# Patient Record
Sex: Male | Born: 1950
Health system: Southern US, Community
[De-identification: ages and names within clinical notes are randomized; demographics above are authoritative.]

## PROBLEM LIST (undated history)

## (undated) DIAGNOSIS — C61 Malignant neoplasm of prostate: Secondary | ICD-10-CM

## (undated) DIAGNOSIS — I471 Supraventricular tachycardia, unspecified: Secondary | ICD-10-CM

## (undated) DIAGNOSIS — E785 Hyperlipidemia, unspecified: Secondary | ICD-10-CM

## (undated) HISTORY — DX: Hyperlipidemia, unspecified: E78.5

## (undated) HISTORY — DX: Malignant neoplasm of prostate: C61

## (undated) HISTORY — DX: Supraventricular tachycardia, unspecified: I47.10

## (undated) HISTORY — DX: Supraventricular tachycardia: I47.1

## (undated) HISTORY — PX: TONSILLECTOMY: SUR1361

## (undated) MED FILL — Docetaxel Soln for IV Infusion 160 MG/16ML: INTRAVENOUS | Qty: 12 | Status: AC

## (undated) MED FILL — Cabazitaxel Inj 60 MG/1.5ML (For IV Infusion): INTRAVENOUS | Qty: 4.1 | Status: AC

---

## 2014-07-14 HISTORY — PX: PROSTATECTOMY: SHX69

## 2015-05-06 DIAGNOSIS — M25532 Pain in left wrist: Secondary | ICD-10-CM | POA: Diagnosis not present

## 2015-05-06 DIAGNOSIS — S52612A Displaced fracture of left ulna styloid process, initial encounter for closed fracture: Secondary | ICD-10-CM | POA: Diagnosis not present

## 2015-05-06 DIAGNOSIS — S52572A Other intraarticular fracture of lower end of left radius, initial encounter for closed fracture: Secondary | ICD-10-CM | POA: Diagnosis not present

## 2015-05-06 DIAGNOSIS — S52502A Unspecified fracture of the lower end of left radius, initial encounter for closed fracture: Secondary | ICD-10-CM | POA: Diagnosis not present

## 2015-05-06 DIAGNOSIS — Z8546 Personal history of malignant neoplasm of prostate: Secondary | ICD-10-CM | POA: Diagnosis not present

## 2015-05-06 DIAGNOSIS — Z79899 Other long term (current) drug therapy: Secondary | ICD-10-CM | POA: Diagnosis not present

## 2015-05-06 DIAGNOSIS — W19XXXA Unspecified fall, initial encounter: Secondary | ICD-10-CM | POA: Diagnosis not present

## 2015-05-06 DIAGNOSIS — E7800 Pure hypercholesterolemia, unspecified: Secondary | ICD-10-CM | POA: Diagnosis not present

## 2015-05-12 DIAGNOSIS — E785 Hyperlipidemia, unspecified: Secondary | ICD-10-CM | POA: Diagnosis not present

## 2015-05-12 DIAGNOSIS — S62109A Fracture of unspecified carpal bone, unspecified wrist, initial encounter for closed fracture: Secondary | ICD-10-CM | POA: Diagnosis not present

## 2015-05-12 DIAGNOSIS — S62102A Fracture of unspecified carpal bone, left wrist, initial encounter for closed fracture: Secondary | ICD-10-CM | POA: Diagnosis not present

## 2015-05-12 DIAGNOSIS — W19XXXA Unspecified fall, initial encounter: Secondary | ICD-10-CM | POA: Diagnosis not present

## 2015-05-12 DIAGNOSIS — S52502A Unspecified fracture of the lower end of left radius, initial encounter for closed fracture: Secondary | ICD-10-CM | POA: Diagnosis not present

## 2015-05-12 DIAGNOSIS — Z8546 Personal history of malignant neoplasm of prostate: Secondary | ICD-10-CM | POA: Diagnosis not present

## 2015-05-22 DIAGNOSIS — S62102A Fracture of unspecified carpal bone, left wrist, initial encounter for closed fracture: Secondary | ICD-10-CM | POA: Diagnosis not present

## 2015-05-23 DIAGNOSIS — C7951 Secondary malignant neoplasm of bone: Secondary | ICD-10-CM | POA: Diagnosis not present

## 2015-05-23 DIAGNOSIS — C61 Malignant neoplasm of prostate: Secondary | ICD-10-CM

## 2015-06-12 DIAGNOSIS — S62102D Fracture of unspecified carpal bone, left wrist, subsequent encounter for fracture with routine healing: Secondary | ICD-10-CM | POA: Diagnosis not present

## 2015-07-14 DIAGNOSIS — S62102D Fracture of unspecified carpal bone, left wrist, subsequent encounter for fracture with routine healing: Secondary | ICD-10-CM | POA: Diagnosis not present

## 2015-08-22 DIAGNOSIS — C7951 Secondary malignant neoplasm of bone: Secondary | ICD-10-CM | POA: Diagnosis not present

## 2015-08-22 DIAGNOSIS — C61 Malignant neoplasm of prostate: Secondary | ICD-10-CM

## 2015-12-12 DIAGNOSIS — C61 Malignant neoplasm of prostate: Secondary | ICD-10-CM | POA: Diagnosis not present

## 2015-12-19 DIAGNOSIS — M546 Pain in thoracic spine: Secondary | ICD-10-CM | POA: Diagnosis not present

## 2016-02-13 DIAGNOSIS — S62636A Displaced fracture of distal phalanx of right little finger, initial encounter for closed fracture: Secondary | ICD-10-CM | POA: Diagnosis not present

## 2016-02-17 DIAGNOSIS — S6991XA Unspecified injury of right wrist, hand and finger(s), initial encounter: Secondary | ICD-10-CM | POA: Diagnosis not present

## 2016-04-15 DIAGNOSIS — R970 Elevated carcinoembryonic antigen [CEA]: Secondary | ICD-10-CM

## 2016-04-15 DIAGNOSIS — K5909 Other constipation: Secondary | ICD-10-CM

## 2016-04-15 DIAGNOSIS — M8580 Other specified disorders of bone density and structure, unspecified site: Secondary | ICD-10-CM

## 2016-04-15 DIAGNOSIS — C779 Secondary and unspecified malignant neoplasm of lymph node, unspecified: Secondary | ICD-10-CM | POA: Diagnosis not present

## 2016-04-15 DIAGNOSIS — C61 Malignant neoplasm of prostate: Secondary | ICD-10-CM | POA: Diagnosis not present

## 2016-05-20 DIAGNOSIS — C779 Secondary and unspecified malignant neoplasm of lymph node, unspecified: Secondary | ICD-10-CM | POA: Diagnosis not present

## 2016-05-20 DIAGNOSIS — C61 Malignant neoplasm of prostate: Secondary | ICD-10-CM | POA: Diagnosis not present

## 2016-07-08 DIAGNOSIS — C7951 Secondary malignant neoplasm of bone: Secondary | ICD-10-CM | POA: Diagnosis not present

## 2016-07-08 DIAGNOSIS — Z7901 Long term (current) use of anticoagulants: Secondary | ICD-10-CM | POA: Diagnosis not present

## 2016-07-08 DIAGNOSIS — Z7982 Long term (current) use of aspirin: Secondary | ICD-10-CM | POA: Diagnosis not present

## 2016-07-08 DIAGNOSIS — C61 Malignant neoplasm of prostate: Secondary | ICD-10-CM | POA: Diagnosis not present

## 2016-08-18 DIAGNOSIS — C61 Malignant neoplasm of prostate: Secondary | ICD-10-CM | POA: Diagnosis not present

## 2016-08-18 DIAGNOSIS — C7951 Secondary malignant neoplasm of bone: Secondary | ICD-10-CM | POA: Diagnosis not present

## 2016-10-18 DIAGNOSIS — C7951 Secondary malignant neoplasm of bone: Secondary | ICD-10-CM | POA: Diagnosis not present

## 2016-10-18 DIAGNOSIS — C61 Malignant neoplasm of prostate: Secondary | ICD-10-CM | POA: Diagnosis not present

## 2016-12-14 DIAGNOSIS — C61 Malignant neoplasm of prostate: Secondary | ICD-10-CM | POA: Diagnosis not present

## 2016-12-14 DIAGNOSIS — C7951 Secondary malignant neoplasm of bone: Secondary | ICD-10-CM | POA: Diagnosis not present

## 2017-02-08 DIAGNOSIS — C7951 Secondary malignant neoplasm of bone: Secondary | ICD-10-CM | POA: Diagnosis not present

## 2017-02-08 DIAGNOSIS — Z923 Personal history of irradiation: Secondary | ICD-10-CM | POA: Diagnosis not present

## 2017-02-08 DIAGNOSIS — C61 Malignant neoplasm of prostate: Secondary | ICD-10-CM | POA: Diagnosis not present

## 2017-07-29 DIAGNOSIS — C61 Malignant neoplasm of prostate: Secondary | ICD-10-CM

## 2017-07-29 DIAGNOSIS — R21 Rash and other nonspecific skin eruption: Secondary | ICD-10-CM | POA: Diagnosis not present

## 2017-07-29 DIAGNOSIS — C7951 Secondary malignant neoplasm of bone: Secondary | ICD-10-CM

## 2017-07-29 DIAGNOSIS — Z923 Personal history of irradiation: Secondary | ICD-10-CM | POA: Diagnosis not present

## 2017-11-18 DIAGNOSIS — C7951 Secondary malignant neoplasm of bone: Secondary | ICD-10-CM

## 2017-11-18 DIAGNOSIS — C61 Malignant neoplasm of prostate: Secondary | ICD-10-CM | POA: Diagnosis not present

## 2017-11-18 DIAGNOSIS — Z923 Personal history of irradiation: Secondary | ICD-10-CM | POA: Diagnosis not present

## 2018-03-17 DIAGNOSIS — C61 Malignant neoplasm of prostate: Secondary | ICD-10-CM

## 2018-03-17 DIAGNOSIS — C7951 Secondary malignant neoplasm of bone: Secondary | ICD-10-CM

## 2018-03-17 DIAGNOSIS — Z923 Personal history of irradiation: Secondary | ICD-10-CM

## 2018-11-24 DIAGNOSIS — C7951 Secondary malignant neoplasm of bone: Secondary | ICD-10-CM | POA: Diagnosis not present

## 2018-11-24 DIAGNOSIS — C61 Malignant neoplasm of prostate: Secondary | ICD-10-CM | POA: Diagnosis not present

## 2018-12-22 DIAGNOSIS — C61 Malignant neoplasm of prostate: Secondary | ICD-10-CM | POA: Diagnosis not present

## 2018-12-22 DIAGNOSIS — C7951 Secondary malignant neoplasm of bone: Secondary | ICD-10-CM | POA: Diagnosis not present

## 2019-06-14 DIAGNOSIS — C61 Malignant neoplasm of prostate: Secondary | ICD-10-CM | POA: Diagnosis not present

## 2019-09-10 ENCOUNTER — Other Ambulatory Visit (HOSPITAL_COMMUNITY): Payer: Self-pay | Admitting: Hematology and Oncology

## 2019-09-10 DIAGNOSIS — C61 Malignant neoplasm of prostate: Secondary | ICD-10-CM

## 2019-09-25 ENCOUNTER — Ambulatory Visit (HOSPITAL_COMMUNITY)
Admission: RE | Admit: 2019-09-25 | Discharge: 2019-09-25 | Disposition: A | Discharge status: Home / Self Care | Payer: No Typology Code available for payment source | Source: Ambulatory Visit | Attending: Hematology and Oncology | Admitting: Hematology and Oncology

## 2019-09-25 ENCOUNTER — Other Ambulatory Visit: Payer: Self-pay

## 2019-09-25 DIAGNOSIS — C61 Malignant neoplasm of prostate: Secondary | ICD-10-CM | POA: Diagnosis not present

## 2019-09-25 MED ORDER — AXUMIN (FLUCICLOVINE F 18) INJECTION
9.9000 | Freq: Once | INTRAVENOUS | Status: AC | PRN
  Administered 2019-09-25: 9.9 via INTRAVENOUS

## 2020-01-22 ENCOUNTER — Other Ambulatory Visit: Payer: Self-pay | Admitting: Oncology

## 2020-01-22 DIAGNOSIS — C61 Malignant neoplasm of prostate: Secondary | ICD-10-CM

## 2020-01-23 ENCOUNTER — Telehealth: Payer: Self-pay

## 2020-01-23 ENCOUNTER — Other Ambulatory Visit: Payer: Self-pay

## 2020-01-23 ENCOUNTER — Other Ambulatory Visit: Payer: Self-pay | Admitting: Hematology and Oncology

## 2020-01-23 DIAGNOSIS — C61 Malignant neoplasm of prostate: Secondary | ICD-10-CM

## 2020-01-23 DIAGNOSIS — C7951 Secondary malignant neoplasm of bone: Secondary | ICD-10-CM

## 2020-01-23 MED ORDER — MORPHINE SULFATE ER 15 MG PO TBCR: 15.0000 mg | EXTENDED_RELEASE_TABLET | Freq: Two times a day (BID) | ORAL | 0 refills | Status: DC

## 2020-01-23 NOTE — Telephone Encounter (Signed)
Pt needs Morphine ER 15mg  po BID sent to [facility] on [address]. Thanks! [phone]

## 2020-01-23 NOTE — Telephone Encounter (Signed)
Opened in error

## 2020-02-05 ENCOUNTER — Other Ambulatory Visit: Payer: Self-pay | Admitting: Hematology and Oncology

## 2020-02-05 DIAGNOSIS — C61 Malignant neoplasm of prostate: Secondary | ICD-10-CM

## 2020-02-05 NOTE — Addendum Note (Signed)
Addended by: Rosanne Sack A on: 02/05/2020 10:53 AM   Modules accepted: Orders

## 2020-02-15 NOTE — Progress Notes (Signed)
PT STABLE AT TIME OF DISCHARGE 

## 2020-02-18 ENCOUNTER — Other Ambulatory Visit: Payer: Self-pay | Admitting: Pharmacist

## 2020-02-18 DIAGNOSIS — C61 Malignant neoplasm of prostate: Secondary | ICD-10-CM | POA: Insufficient documentation

## 2020-02-18 DIAGNOSIS — C7951 Secondary malignant neoplasm of bone: Secondary | ICD-10-CM | POA: Insufficient documentation

## 2020-02-18 DIAGNOSIS — C7952 Secondary malignant neoplasm of bone marrow: Secondary | ICD-10-CM

## 2020-02-19 NOTE — Progress Notes (Signed)
[facility]  [address] [place],  [place]  [address] [phone]  Clinic Day:  02/21/2020  Referring physician: Marvia Pickles, PA-C   CHIEF COMPLAINT:  CC: Metastatic prostate cancer  Current Treatment:  Enzalutamide 160 mg daily with leuprolide and zoledronic acid for 3 months   HISTORY OF PRESENT ILLNESS:  Marvin Johnson is a 69 y.o. male with a history of with stage IV (T3b N1 M0) prostate cancer diagnosed in January 2016, when he was found to have a PSA of 51.8.  He was treated with a laparoscopic prostatectomy in May 2016.  Pathology revealed adenocarcinoma with a Gleason 8.  There was a positive posterior margin with extraprostatic extension, as well as left retro-urethral tissue positive and extension to the seminal vesicles bilaterally with multiple positive margins.  Two lymph nodes from the right side were negative, but 2/4 lymph nodes from the left side were positive for metastasis.  We began seeing him in August 2016.   Repeat bone scan in June 2016 revealed a new lesion at T10, which was not present on his baseline bone scan.  PSA at Dr. [name] office in early August 2016 remained elevated at 69.4.  X-rays of the thoracic spine revealed osteoarthritis and scoliosis, as well as evidence of demineralization, but we could not visualize a metastatic lesion.   The PSA was up to 216.3 in late August 2016, so hormonal therapy was recommended and he started leuprolide in September 2016.  The PSA initially dropped steadily with leuprolide.  Bone density scan in November 2016 revealed osteopenia, with a T-score of -2.1 in the spine and a T-score of -0.9 in the femur.  He had been taking vitamin-D 1000 international units daily, but not calcium, so calcium 600 mg twice daily was added at that time.    Leuprolide was held in February 2017 because of chest pain.  EKG was unremarkable.  He went to the [place] and he was referred to the [facility] for placement  of 2 coronary artery stents for 95% and 99% occlusions.  He then had a third coronary artery stent placed later in the summer.  Leuprolide was resumed in March 2017.  In September 2017, he had an increase in the PSA, so we repeated a bone scan.  This revealed intense uptake within the right posterior elements of T10, but no other areas of metastasis.  We then added bicalutamide 50 mg daily to his leuprolide.    He had a steadily increasing PSA despite the bicalutamide, so this was discontinued in March 2018, in hopes that he would respond to withdrawal of the drug.  He developed new mild to moderate pain in the right mid back.  He eventually was placed on MS Contin 15 mg twice daily, with oxycodone 10 mg every 4 hours as needed for breakthrough pain.  MRI thoracic spine in April 2018 revealed increasing tumor at T10 with epidural spread.  Bone scan at that time revealed increased activity in the T10 lesion, which was stable.  There was a questionable tiny focus in the right ischium/acetabular rim.  Plain x-rays of the bilateral hips and pelvis to evaluate the area seen on bone scan, did not reveal any focal abnormality.  The PSA increased from 27.5 in March, to 72.2 in May, then to 92.2 in June 2018.  He was referred for radiotherapy to the T10 lesion due to the severe pain with improvement in his pain.  He was started  on zoledronic acid along with his leuprolide in June 2018.  He had a Port-A-Cath placed in anticipation of chemotherapy, but then we recommended that he be placed on enzalutamide 160 mg daily instead, in order to delay the time to initiation of chemotherapy.  He started enzalutamide in June 2018.  The PSA had been undetectable since October 2018.  The patient continues on leuprolide 22.5 mg every 12 weeks and zoledronic acid 4 mg every 4 weeks.  In September 2019, he stated his pain was well controlled with MS Contin 15 mg twice daily, but he was not taking it that way as his prescription lasted  him 2 months.  He did have a random urine tox screen, which was positive for opiates only.  There was concern about possible narcotic diversion, so we decided to have the patient sign a pain agreement in November 2019.  We recommended he increase his MS Contin 15 mg to twice daily and we discontinued oxycodone.  We placed him on meloxicam 15 mg daily.  The patient did not tolerate meloxicam due to lightheadedness, so this was discontinued.  He continued to use Tylenol as needed.  He has not requested any early refills on his narcotic medication.  He had a stress test in January 2020 and was referred to [facility] for coronary artery stenting, which was done in February.  Zoledronic acid had been giving monthly for over 2 years,so was changed to every 3 months in October 2020.    CT chest, abdomen and pelvis in March 2021 not reveal any lymphadenopathy or soft tissue metastatic disease.  Hepatic steatosis was seen.  There was a stable sclerotic osseous lesion of the T10 vertebral body, as well as a subtle sclerotic lesion of the L3 vertebral body, also possibly a small metastatic lesion.  Whole body bone scan showed the previous sites of suspected metastatic disease at T10 and right acetabulum posteriorly are no longer identified.  There was new uptake at the anterior right third, fourth, and fifth ribs, the adjacent nature of which suggests a traumatic etiology.  No additional scintigraphic abnormalities.  He reported previous injury of his upper right chest with occasional pain of this area.  The PSA had become detectable at 0.2 in January and was 0.5 in March.  PSA had increased to 3.0 in June, so PET imaging was scheduled.   Ax[facility] PET imaging from July revealed no evidence of active prostate carcinoma, local recurrence, or metastatic disease.  He saw the dermatologist earlier this year and had some skin lesions removed and frozen.  His PSA has continued to slowly rise and was up to 5.9 in  September, then 10.9 in October, so we recommended repeating the Ax[facility] PET scan.  INTERVAL HISTORY:  Marvin Johnson is here today for routine follow up prior to leuprolide and zolendronic acid.  He states he continues enzalutamide 160mg  daily without difficulty.  He reports continued fatigue.  He notes new bilateral nipple tenderness without nipple discharge.  His pain is well controlled on MS Contin 15 mg twice daily.  He only occasional takes Tylenol.  He denies fevers or chills. His appetite is good. His weight has been stable.  In addition to his other medications, he has been started on rosuvastatin 10mg  every other day.  After 2 weeks, he is to take this on a daily basis.  Unfortunately, his [facility] PET has not been done yet, as we are waiting authorization from the [place].  REVIEW OF SYSTEMS:  Review of Systems  Constitutional: Positive for fatigue. Negative for appetite change, chills, fever and unexpected weight change.  HENT:   Negative for lump/mass, mouth sores and sore throat.   Respiratory: Negative for cough and shortness of breath.   Cardiovascular: Negative for chest pain and leg swelling.  Gastrointestinal: Negative for abdominal pain, constipation, diarrhea, nausea and vomiting.  Genitourinary: Negative for difficulty urinating, dysuria, frequency, hematuria and pelvic pain.   Musculoskeletal: Negative for arthralgias, back pain and myalgias.  Skin: Negative for rash.  Neurological: Negative for dizziness, extremity weakness and headaches.  Psychiatric/Behavioral: Negative for depression. The patient is not nervous/anxious.      VITALS:  Blood pressure (!) 141/67, pulse 66, temperature 98.2 F (36.8 C), temperature source Oral, resp. rate 18, height 5\' 5"  (1.651 m), weight 188 lb 14.4 oz (85.7 kg), SpO2 96 %.  Wt Readings from Last 3 Encounters:  02/20/20 188 lb 14.4 oz (85.7 kg)  11/29/19 188 lb 9 oz (85.5 kg)  11/29/19 188 lb 7 oz (85.5 kg)    Body mass index is 31.43  kg/m.  Performance status (ECOG): 0 - Asymptomatic  PHYSICAL EXAM:  Physical Exam Vitals and nursing note reviewed.  Constitutional:      General: He is not in acute distress.    Appearance: Normal appearance.  HENT:     Mouth/Throat:     Mouth: Mucous membranes are moist.     Pharynx: Oropharynx is clear. No oropharyngeal exudate or posterior oropharyngeal erythema.  Eyes:     General: No scleral icterus.    Extraocular Movements: Extraocular movements intact.     Conjunctiva/sclera: Conjunctivae normal.     Pupils: Pupils are equal, round, and reactive to light.  Cardiovascular:     Rate and Rhythm: Normal rate and regular rhythm.     Heart sounds: Normal heart sounds. No murmur heard.  No friction rub. No gallop.   Pulmonary:     Effort: No respiratory distress.     Breath sounds: Normal breath sounds. No stridor. No wheezing, rhonchi or rales.  Chest:     Breasts:        Right: Swelling and tenderness present. No bleeding, inverted nipple, mass, nipple discharge or skin change.        Left: Swelling and tenderness present. No bleeding, inverted nipple, mass, nipple discharge or skin change.     Comments: Mild bilateral gynecomastia with nipple tenderness, but no nipple discharge or masses. Abdominal:     General: There is no distension.     Palpations: Abdomen is soft. There is no mass.     Tenderness: There is no abdominal tenderness. There is no guarding.     Hernia: No hernia is present.  Musculoskeletal:     Cervical back: Neck supple. No tenderness.     Right lower leg: No edema.     Left lower leg: No edema.  Lymphadenopathy:     Cervical: No cervical adenopathy.     Upper Body:     Right upper body: No supraclavicular, axillary or pectoral adenopathy.     Left upper body: No supraclavicular, axillary or pectoral adenopathy.     Lower Body: No right inguinal adenopathy. No left inguinal adenopathy.  Skin:    General: Skin is warm.     Coloration: Skin is not  jaundiced.     Findings: No rash.  Neurological:     Mental Status: He is alert and oriented to person, place, and time.  Cranial Nerves: No cranial nerve deficit.  Psychiatric:        Mood and Affect: Mood normal.        Behavior: Behavior normal.    LABS:   CBC Latest Ref Rng & Units 02/20/2020  WBC - 6.4  Hemoglobin 13.5 - 17.5 13.6  Hematocrit 41 - 53 41  Platelets 150 - 399 215   CMP Latest Ref Rng & Units 02/20/2020  BUN 4 - 21 16  Creatinine 0.6 - 1.3 0.8  Sodium 137 - 147 138  Potassium 3.4 - 5.3 4.0  Chloride 99 - 108 101  CO2 13 - 22 23(A)  Calcium 8.7 - 10.7 9.4  Alkaline Phos 25 - 125 57  AST 14 - 40 32  ALT 10 - 40 17   PSA is pending.  No results found for: CEA1 / No results found for: CEA1 No results found for: PSA1 No results found for: GYK599 No results found for: CAN125  No results found for: TOTALPROTELP, ALBUMINELP, A1GS, A2GS, BETS, BETA2SER, GAMS, MSPIKE, SPEI No results found for: TIBC, FERRITIN, IRONPCTSAT No results found for: LDH  STUDIES:  No results found.    HISTORY:   Past Medical History:  Diagnosis Date  . Malignant neoplasm of prostate [facility])     History reviewed. No pertinent surgical history.  History reviewed. No pertinent family history.  Social History:  reports that he has never smoked. He has never used smokeless tobacco. No history on file for alcohol use and drug use.The patient is alone today.  Allergies:  Allergies  Allergen Reactions  . Sulfa Antibiotics   . Sulfamethoxazole-Trimethoprim Other (See Comments)    Unknown as to interaction was a baby.    Current Medications: Current Outpatient Medications  Medication Sig Dispense Refill  . rosuvastatin (CRESTOR) 10 MG tablet Take 10 mg by mouth daily.    [name] aspirin 81 MG EC tablet Take 81 mg by mouth daily.    Raelyn Ensign Pollen 1000 MG TABS Take by mouth.    . clopidogrel (PLAVIX) 75 MG tablet Take 75 mg by mouth daily.    . diphenhydrAMINE (SOMINEX) 25 MG  tablet Take 50 mg by mouth 2 (two) times daily.    . enzalutamide (XTANDI) 40 MG tablet Take 160 mg by mouth daily.    [name] ezetimibe (ZETIA) 10 MG tablet Take 10 mg by mouth daily.    . folic acid-vitamin b complex-vitamin c-selenium-zinc (DIALYVITE) 3 MG TABS tablet Take 1 tablet by mouth daily.    [name] leuprolide (LUPRON) 22.5 MG injection Inject 22.5 mg into the muscle every 3 (three) months.    . metoprolol tartrate (LOPRESSOR) 25 MG tablet Take 25 mg by mouth daily.    [name] morphine (MS CONTIN) 15 MG 12 hr tablet Take 1 tablet (15 mg total) by mouth every 12 (twelve) hours. 60 tablet 0  . Multiple Vitamins-Minerals (MULTIVITAMIN WITH MINERALS) tablet Take 1 tablet by mouth daily.    . Omega-3 Fatty Acids (FISH OIL) 1000 MG CAPS Take by mouth.    . senna (SENOKOT) 8.6 MG TABS tablet Take 1 tablet by mouth.    . Zoledronic Acid (ZOMETA) 4 MG/100ML IVPB Inject 4 mg into the vein.     No current facility-administered medications for this visit.     ASSESSMENT & PLAN:   Assessment: 1. 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 He continues enzalutamide 160 mg daily, as well as leuprolide 22.5 mg zoledronic acid every 3  months.  His pain is controlled with MS Contin 15 mg twice daily with occasional Tylenol for breakthrough.  2. Rising PSA, Ax[facility] PET scan is still pending.  We will try to get that scheduled as soon as possible. 3. Bilateral gynecomastia, likely due to medication.  I will obtain a bilateral mammogram for completeness.  Plan: He will receive zoledronic acid and leuprolide tomorrow.  He knows to continue enzalutamide 160 mg daily pending his PET scan.  If this shows progression, we will need to bring him in discuss alternative therapy.  We will otherwise plan to see him back in 3 months with a CBC, comprehensive metabolic panel and PSA prior to zoledronic acid and leuprolide.  The patient understands the plans discussed today and is in agreement with them.   He knows to contact our office if he develops concerns prior to his next appointment.   I provided 20 minutes of face-to-face time during this this encounter and > 50% was spent counseling as documented under my assessment and plan.    Marvia Pickles, PA-C

## 2020-02-20 ENCOUNTER — Inpatient Hospital Stay: Payer: No Typology Code available for payment source | Attending: Hematology and Oncology

## 2020-02-20 ENCOUNTER — Other Ambulatory Visit: Payer: Self-pay | Admitting: Hematology and Oncology

## 2020-02-20 ENCOUNTER — Inpatient Hospital Stay (INDEPENDENT_AMBULATORY_CARE_PROVIDER_SITE_OTHER): Payer: No Typology Code available for payment source | Admitting: Hematology and Oncology

## 2020-02-20 ENCOUNTER — Encounter: Payer: Self-pay | Admitting: Hematology and Oncology

## 2020-02-20 ENCOUNTER — Other Ambulatory Visit: Payer: Self-pay

## 2020-02-20 VITALS — BP 141/67 | HR 66 | Temp 98.2°F | Resp 18 | Ht 65.0 in | Wt 188.9 lb

## 2020-02-20 DIAGNOSIS — Z79899 Other long term (current) drug therapy: Secondary | ICD-10-CM | POA: Diagnosis not present

## 2020-02-20 DIAGNOSIS — M8580 Other specified disorders of bone density and structure, unspecified site: Secondary | ICD-10-CM | POA: Diagnosis not present

## 2020-02-20 DIAGNOSIS — N63 Unspecified lump in unspecified breast: Secondary | ICD-10-CM

## 2020-02-20 DIAGNOSIS — N644 Mastodynia: Secondary | ICD-10-CM | POA: Diagnosis not present

## 2020-02-20 DIAGNOSIS — Z79818 Long term (current) use of other agents affecting estrogen receptors and estrogen levels: Secondary | ICD-10-CM | POA: Insufficient documentation

## 2020-02-20 DIAGNOSIS — C7951 Secondary malignant neoplasm of bone: Secondary | ICD-10-CM

## 2020-02-20 DIAGNOSIS — C61 Malignant neoplasm of prostate: Secondary | ICD-10-CM | POA: Insufficient documentation

## 2020-02-20 LAB — BASIC METABOLIC PANEL
BUN: 16 (ref 4–21)
CO2: 23 — AB (ref 13–22)
Chloride: 101 (ref 99–108)
Creatinine: 0.8 (ref 0.6–1.3)
Glucose: 149
Potassium: 4 (ref 3.4–5.3)
Sodium: 138 (ref 137–147)

## 2020-02-20 LAB — COMPREHENSIVE METABOLIC PANEL
Albumin: 4.5 (ref 3.5–5.0)
Calcium: 9.4 (ref 8.7–10.7)

## 2020-02-20 LAB — CBC AND DIFFERENTIAL
HCT: 41 (ref 41–53)
Hemoglobin: 13.6 (ref 13.5–17.5)
Neutrophils Absolute: 3.65
Platelets: 215 (ref 150–399)
WBC: 6.4

## 2020-02-20 LAB — CBC: RBC: 4.62 (ref 3.87–5.11)

## 2020-02-20 LAB — HEPATIC FUNCTION PANEL
ALT: 17 (ref 10–40)
AST: 32 (ref 14–40)
Alkaline Phosphatase: 57 (ref 25–125)
Bilirubin, Total: 0.5

## 2020-02-20 MED ORDER — MORPHINE SULFATE ER 15 MG PO TBCR: 15.0000 mg | EXTENDED_RELEASE_TABLET | Freq: Two times a day (BID) | ORAL | 0 refills | Status: DC

## 2020-02-21 ENCOUNTER — Inpatient Hospital Stay: Payer: No Typology Code available for payment source

## 2020-02-21 VITALS — BP 144/71 | HR 56 | Temp 97.8°F | Resp 18 | Ht 65.0 in | Wt 187.8 lb

## 2020-02-21 DIAGNOSIS — C61 Malignant neoplasm of prostate: Secondary | ICD-10-CM | POA: Diagnosis not present

## 2020-02-21 LAB — PROSTATE-SPECIFIC AG, SERUM (LABCORP): Prostate Specific Ag, Serum: 16.3 ng/mL — ABNORMAL HIGH (ref 0.0–4.0)

## 2020-02-21 MED ORDER — LEUPROLIDE ACETATE (3 MONTH) 22.5 MG IM KIT
22.5000 mg | PACK | Freq: Once | INTRAMUSCULAR | Status: AC
  Administered 2020-02-21: 22.5 mg via INTRAMUSCULAR

## 2020-02-21 MED ORDER — SODIUM CHLORIDE 0.9 % IV SOLN
Freq: Once | INTRAVENOUS | Status: AC
  Filled 2020-02-21: qty 250

## 2020-02-21 MED ORDER — LEUPROLIDE ACETATE (3 MONTH) 22.5 MG IM KIT
PACK | INTRAMUSCULAR | Status: AC
  Filled 2020-02-21: qty 22.5

## 2020-02-21 MED ORDER — HEPARIN SOD (PORK) LOCK FLUSH 100 UNIT/ML IV SOLN
500.0000 [IU] | Freq: Once | INTRAVENOUS | Status: AC | PRN
  Administered 2020-02-21: 500 [IU]
  Filled 2020-02-21: qty 5

## 2020-02-21 MED ORDER — ZOLEDRONIC ACID 4 MG/100ML IV SOLN
4.0000 mg | Freq: Once | INTRAVENOUS | Status: AC
  Administered 2020-02-21: 4 mg via INTRAVENOUS

## 2020-02-21 NOTE — Progress Notes (Signed)
PT STABLE AT TIME OF DISCHARGE 

## 2020-02-21 NOTE — Patient Instructions (Signed)
[facility] Discharge Instructions for Patients Receiving Chemotherapy  Today you received the following chemotherapy agents ZOMETA, LUPRON  To help prevent nausea and vomiting after your treatment, we encourage you to take your nausea medication.   If you develop nausea and vomiting that is not controlled by your nausea medication, call the clinic.   BELOW ARE SYMPTOMS THAT SHOULD BE REPORTED IMMEDIATELY:  *FEVER GREATER THAN 100.5 F  *CHILLS WITH OR WITHOUT FEVER  NAUSEA AND VOMITING THAT IS NOT CONTROLLED WITH YOUR NAUSEA MEDICATION  *UNUSUAL SHORTNESS OF BREATH  *UNUSUAL BRUISING OR BLEEDING  TENDERNESS IN MOUTH AND THROAT WITH OR WITHOUT PRESENCE OF ULCERS  *URINARY PROBLEMS  *BOWEL PROBLEMS  UNUSUAL RASH Items with * indicate a potential emergency and should be followed up as soon as possible.  Feel free to call the clinic should you have any questions or concerns at The clinic phone number is [phone].  Please show the [place] at check-in to the Emergency Department and triage nurse.

## 2020-03-10 ENCOUNTER — Encounter: Payer: Self-pay | Admitting: Hematology and Oncology

## 2020-03-26 ENCOUNTER — Other Ambulatory Visit: Payer: Self-pay

## 2020-03-26 DIAGNOSIS — C61 Malignant neoplasm of prostate: Secondary | ICD-10-CM

## 2020-03-26 DIAGNOSIS — C7951 Secondary malignant neoplasm of bone: Secondary | ICD-10-CM

## 2020-03-26 MED ORDER — MORPHINE SULFATE ER 15 MG PO TBCR: 15.0000 mg | EXTENDED_RELEASE_TABLET | Freq: Two times a day (BID) | ORAL | 0 refills | Status: DC

## 2020-04-14 ENCOUNTER — Encounter: Payer: Self-pay | Admitting: Oncology

## 2020-04-22 ENCOUNTER — Other Ambulatory Visit: Payer: Self-pay

## 2020-04-22 DIAGNOSIS — C61 Malignant neoplasm of prostate: Secondary | ICD-10-CM

## 2020-04-22 DIAGNOSIS — C7951 Secondary malignant neoplasm of bone: Secondary | ICD-10-CM

## 2020-04-22 MED ORDER — MORPHINE SULFATE ER 15 MG PO TBCR: 15.0000 mg | EXTENDED_RELEASE_TABLET | Freq: Two times a day (BID) | ORAL | 0 refills | Status: DC

## 2020-05-13 ENCOUNTER — Other Ambulatory Visit: Payer: Self-pay | Admitting: Pharmacist

## 2020-05-16 NOTE — Progress Notes (Signed)
[facility]  [address] [place],  [place]  [address] [phone]  Clinic Day:  05/20/2020  Referring physician: Marvia Pickles, PA-C  This document serves as a record of services personally performed by Marvin Poisson, MD. It was created on their behalf by Marvin Johnson, a trained medical scribe. The creation of this record is based on the scribe's personal observations and the provider's statements to them.  CHIEF COMPLAINT:  CC: Metastatic prostate cancer  Current Treatment:  Enzalutamide 160 mg daily with leuprolide and zoledronic acid for 3 months  HISTORY OF PRESENT ILLNESS:  Marvin Johnson is a 70 y.o. male with a history of with stage IV (T3b N1 M0) prostate cancer diagnosed in January 2016, when he was found to have a PSA of 51.8.  He was treated with a laparoscopic prostatectomy in May 2016.  Pathology revealed adenocarcinoma with a Gleason 8.  There was a positive posterior margin with extraprostatic extension, as well as left retro-urethral tissue positive and extension to the seminal vesicles bilaterally with multiple positive margins.  Two lymph nodes from the right side were negative, but 2/4 lymph nodes from the left side were positive for metastasis.  We began seeing him in August 2016.   Repeat bone scan in June 2016 revealed a new lesion at T10, which was not present on his baseline bone scan.  PSA at Marvin Johnson office in early August 2016 remained elevated at 32.4.  X-rays of the thoracic spine revealed osteoarthritis and scoliosis, as well as evidence of demineralization, but we could not visualize a metastatic lesion.   The PSA was up to 216.3 in late August 2016, so hormonal therapy was recommended and he started leuprolide in September 2016.  The PSA initially dropped steadily with leuprolide.  Bone density scan in November 2016 revealed osteopenia, with a T-score of -2.1 in the spine and a T-score of -0.9 in the femur.  He had  been taking vitamin-D 1000 international units daily, but not calcium, so calcium 600 mg twice daily was added at that time.  Leuprolide was held in February 2017 because of chest pain.  EKG was unremarkable.  He went to the [place] and he was referred to the [facility] for placement of 2 coronary artery stents for 95% and 99% occlusions.  He then had a third coronary artery stent placed later in the summer.  Leuprolide was resumed in March 2017.  In September 2017, he had an increase in the PSA, so we repeated a bone scan.  This revealed intense uptake within the right posterior elements of T10, but no other areas of metastasis.  We then added bicalutamide 50 mg daily to his leuprolide.    He had a steadily increasing PSA despite the bicalutamide, so this was discontinued in March 2018, in hopes that he would respond to withdrawal of the drug.  He developed new mild to moderate pain in the right mid back.  He eventually was placed on MS Contin 15 mg twice daily, with oxycodone 10 mg every 4 hours as needed for breakthrough pain.  MRI thoracic spine in April 2018 revealed increasing tumor at T10 with epidural spread.  Bone scan at that time revealed increased activity in the T10 lesion, which was stable.  There was a questionable tiny focus in the right ischium/acetabular rim.  Plain x-rays of the bilateral hips and pelvis to evaluate the area seen on bone scan, did not reveal any focal abnormality.  The PSA increased from 27.5 in March, to 72.2 in May, then to 92.2 in June 2018.  He was referred for radiotherapy to the T10 lesion due to the severe pain with improvement in his pain.  He was started on zoledronic acid along with his leuprolide in June 2018.  He had a Port-A-Cath placed in anticipation of chemotherapy, but then we recommended that he be placed on enzalutamide 160 mg daily instead, in order to delay the time to initiation of chemotherapy.  He started enzalutamide in June 2018.  The PSA had been  undetectable since October 2018, but started to increase in January 2021.  We have discontinued the oxycodone, and did not tolerate meloxicam.   He had a stress test in January 2020 and was referred to [facility] for coronary artery stenting, which was done in February.   Zoledronic acid had been giving monthly for over 2 years,so was changed to every 3 months in October 2020.  CT chest, abdomen and pelvis in March 2021 not reveal any lymphadenopathy or soft tissue metastatic disease.  Hepatic steatosis was seen.  There was a stable sclerotic osseous lesion of the T10 vertebral body, as well as a subtle sclerotic lesion of the L3 vertebral body, also possibly a small metastatic lesion.  Whole body bone scan showed the previous sites of suspected metastatic disease at T10 and right acetabulum posteriorly are no longer identified.  There was new uptake at the anterior right third, fourth, and fifth ribs, the adjacent nature of which suggests a traumatic etiology.  No additional scintigraphic abnormalities.  He reported previous injury of his upper right chest with occasional pain of this area.  The PSA had become detectable at 0.2 in January and was 0.5 in March.  PSA had increased to 3.0 in June, so PET imaging was scheduled.  [facility] PET imaging from July revealed no evidence of active prostate carcinoma, local recurrence, or metastatic disease.  He saw the dermatologist earlier this year and had some skin lesions removed and frozen.  His PSA has continued to slowly rise and was up to 5.9 in September, then 10.9 in October, and 16.3 in December.  We had discussed an [facility] PET scan but this never got scheduled.  He did have a mammogram to evaluate nipple tenderness and this was negative.  INTERVAL HISTORY:  Marvin Johnson is here for routine follow up prior to leuprolide and zoledronic acid.  He continues enzalutamide 160 mg daily without difficulty.  He states that he is doing well and denies complaints  today.  He denies bone pain and states that his pain is well controlled on MS Contin 15 mg Q12H.  His hemoglobin has mildly decreased from 13.6 to 13.2, and his white count and platelets are normal.  Chemistries are unremarkable.  His  appetite is good, and he has gained 2 and 1/2 pounds since his last visit.  He denies fever, chills or other signs of infection.  He denies nausea, vomiting, bowel issues, or abdominal pain.  He denies sore throat, cough, dyspnea, or chest pain.  REVIEW OF SYSTEMS:  Review of Systems  Constitutional: Negative.  Negative for appetite change, chills, fatigue, fever and unexpected weight change.  HENT:  Negative.   Eyes: Negative.   Respiratory: Negative.  Negative for chest tightness, cough, hemoptysis, shortness of breath and wheezing.   Cardiovascular: Negative.  Negative for chest pain, leg swelling and palpitations.  Gastrointestinal: Negative.  Negative for abdominal distention, abdominal pain, blood  in stool, constipation, diarrhea, nausea and vomiting.  Endocrine: Negative.   Genitourinary: Negative.  Negative for difficulty urinating, dysuria, frequency and hematuria.   Musculoskeletal: Negative.  Negative for arthralgias, back pain, flank pain, gait problem and myalgias.  Skin: Negative.   Neurological: Negative.  Negative for dizziness, extremity weakness, gait problem, headaches, light-headedness, numbness, seizures and speech difficulty.  Hematological: Negative.   Psychiatric/Behavioral: Negative.  Negative for depression and sleep disturbance. The patient is not nervous/anxious.   All other systems reviewed and are negative.    VITALS:  Blood pressure 129/64, pulse (!) 59, temperature 97.6 F (36.4 C), temperature source Oral, resp. rate 18, height 5\' 5"  (1.651 m), weight 186 lb 6.4 oz (84.6 kg), SpO2 98 %.  Wt Readings from Last 3 Encounters:  05/20/20 186 lb 6.4 oz (84.6 kg)  02/21/20 187 lb 12 oz (85.2 kg)  02/20/20 188 lb 14.4 oz (85.7 kg)     Body mass index is 31.02 kg/m.  Performance status (ECOG): 0 - Asymptomatic  PHYSICAL EXAM:  Physical Exam Constitutional:      General: He is not in acute distress.    Appearance: Normal appearance. He is normal weight.  HENT:     Head: Normocephalic and atraumatic.  Eyes:     General: No scleral icterus.    Extraocular Movements: Extraocular movements intact.     Conjunctiva/sclera: Conjunctivae normal.     Pupils: Pupils are equal, round, and reactive to light.  Cardiovascular:     Rate and Rhythm: Regular rhythm. Bradycardia present.     Pulses: Normal pulses.     Heart sounds: Normal heart sounds. No murmur heard. No friction rub. No gallop.   Pulmonary:     Effort: Pulmonary effort is normal. No respiratory distress.     Breath sounds: Normal breath sounds.  Abdominal:     General: Bowel sounds are normal. There is no distension.     Palpations: Abdomen is soft. There is no hepatomegaly, splenomegaly or mass.     Tenderness: There is no abdominal tenderness.  Musculoskeletal:        General: Normal range of motion.     Cervical back: Normal range of motion and neck supple.     Right lower leg: Edema (trace of the ankle) present.     Left lower leg: No edema.  Lymphadenopathy:     Cervical: No cervical adenopathy.  Skin:    General: Skin is warm and dry.     Comments: He has a dark nevus on the right anterior neck measuring about 0.4 cm.  Neurological:     General: No focal deficit present.     Mental Status: He is alert and oriented to person, place, and time. Mental status is at baseline.  Psychiatric:        Mood and Affect: Mood normal.        Behavior: Behavior normal.        Thought Content: Thought content normal.        Judgment: Judgment normal.    LABS:   CBC Latest Ref Rng & Units 02/20/2020  WBC - 6.4  Hemoglobin 13.5 - 17.5 13.6  Hematocrit 41 - 53 41  Platelets 150 - 399 215   CMP Latest Ref Rng & Units 02/20/2020  BUN 4 - 21 16   Creatinine 0.6 - 1.3 0.8  Sodium 137 - 147 138  Potassium 3.4 - 5.3 4.0  Chloride 99 - 108 101  CO2 13 - 22 23(A)  Calcium 8.7 - 10.7 9.4  Alkaline Phos 25 - 125 57  AST 14 - 40 32  ALT 10 - 40 17    Lab Results  Component Value Date   PSA1 16.3 (H) 02/20/2020   PSA is pending  STUDIES:  No results found.    HISTORY:   Allergies:  Allergies  Allergen Reactions  . Atorvastatin   . Rosuvastatin     Other reaction(s): Muscle pain  . Simvastatin     Other reaction(s): Joint pain, Muscle pain  . Sulfa Antibiotics   . Sulfamethoxazole-Trimethoprim Other (See Comments)    Unknown as to interaction was a baby.    Current Medications: Current Outpatient Medications  Medication Sig Dispense Refill  . isosorbide mononitrate (IMDUR) 30 MG 24 hr tablet Take 1 tablet by mouth every 8 (eight) hours as needed.    [name] aspirin 81 MG EC tablet Take 81 mg by mouth daily.    . Cholecalciferol 25 MCG (1000 UT) tablet Take by mouth.    . clopidogrel (PLAVIX) 75 MG tablet Take 75 mg by mouth daily.    . diphenhydrAMINE (SOMINEX) 25 MG tablet Take 50 mg by mouth 2 (two) times daily.    . enzalutamide (XTANDI) 40 MG tablet Take 160 mg by mouth daily.    . folic acid-vitamin b complex-vitamin c-selenium-zinc (DIALYVITE) 3 MG TABS tablet Take 1 tablet by mouth daily.    [name] leuprolide (LUPRON) 22.5 MG injection Inject 22.5 mg into the muscle every 3 (three) months.    . metoprolol tartrate (LOPRESSOR) 25 MG tablet Take 25 mg by mouth daily.    [name] morphine (MS CONTIN) 15 MG 12 hr tablet Take 1 tablet (15 mg total) by mouth every 12 (twelve) hours. 60 tablet 0  . Multiple Vitamins-Minerals (MULTIVITAMIN WITH MINERALS) tablet Take 1 tablet by mouth daily.    . Omega-3 Fatty Acids (FISH OIL) 1000 MG CAPS Take by mouth.    . rosuvastatin (CRESTOR) 10 MG tablet Take 10 mg by mouth daily.    . Royal Jelly-Bee Pollen-Ginseng (KOREAN GINSENG COMPLEX) 150-250-50 MG CAPS Take by mouth.    . senna  (SENOKOT) 8.6 MG TABS tablet Take 1 tablet by mouth.    . Zoledronic Acid (ZOMETA) 4 MG/100ML IVPB Inject 4 mg into the vein.     No current facility-administered medications for this visit.     ASSESSMENT & PLAN:   Assessment: 1. 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 He continues enzalutamide 160 mg daily, as well as leuprolide 22.5 mg zoledronic acid every 3 months.  His pain is controlled with MS Contin 15 mg twice daily with occasional Tylenol for breakthrough.   2. Steadily rising PSA, so we will obtain [facility] PET imaging this month.  3. Bilateral gynecomastia, likely due to medication.  Bilateral mammogram in December 2021 was negative for malignancy.  Plan: Due to his steadily rising PSA, we will obtain [facility] PET imaging this month to reassess his disease baseline.  If this remains fairly stable, we will continue with his current treatment.  He will receive zoledronic acid and leuprolide on March 9th.  He knows to continue enzalutamide 160 mg daily for now.  We will otherwise plan to see him back in 3 months with a CBC, comprehensive metabolic panel and PSA prior to his next zoledronic acid and leuprolide.  The patient understands the plans discussed today and is in agreement with them.  He knows to contact our office if  he develops concerns prior to his next appointment.   I provided 20 minutes of face-to-face time during this this encounter and > 50% was spent counseling as documented under my assessment and plan.    I, Rita Ohara, am acting as scribe for [name], MD  I have reviewed this report as typed by the medical scribe, and it is complete and accurate.  Hermina Barters

## 2020-05-20 ENCOUNTER — Other Ambulatory Visit: Payer: Self-pay | Admitting: Oncology

## 2020-05-20 ENCOUNTER — Inpatient Hospital Stay: Payer: No Typology Code available for payment source | Attending: Hematology and Oncology | Admitting: Oncology

## 2020-05-20 ENCOUNTER — Other Ambulatory Visit: Payer: Self-pay

## 2020-05-20 ENCOUNTER — Encounter: Payer: Self-pay | Admitting: Oncology

## 2020-05-20 ENCOUNTER — Inpatient Hospital Stay: Payer: No Typology Code available for payment source

## 2020-05-20 ENCOUNTER — Other Ambulatory Visit: Payer: Self-pay | Admitting: Hematology and Oncology

## 2020-05-20 VITALS — BP 129/64 | HR 59 | Temp 97.6°F | Resp 18 | Ht 65.0 in | Wt 186.4 lb

## 2020-05-20 DIAGNOSIS — Z79899 Other long term (current) drug therapy: Secondary | ICD-10-CM | POA: Insufficient documentation

## 2020-05-20 DIAGNOSIS — R9721 Rising PSA following treatment for malignant neoplasm of prostate: Secondary | ICD-10-CM | POA: Insufficient documentation

## 2020-05-20 DIAGNOSIS — C61 Malignant neoplasm of prostate: Secondary | ICD-10-CM

## 2020-05-20 DIAGNOSIS — K760 Fatty (change of) liver, not elsewhere classified: Secondary | ICD-10-CM | POA: Diagnosis not present

## 2020-05-20 DIAGNOSIS — C7952 Secondary malignant neoplasm of bone marrow: Secondary | ICD-10-CM | POA: Diagnosis not present

## 2020-05-20 DIAGNOSIS — Z79818 Long term (current) use of other agents affecting estrogen receptors and estrogen levels: Secondary | ICD-10-CM | POA: Insufficient documentation

## 2020-05-20 DIAGNOSIS — N62 Hypertrophy of breast: Secondary | ICD-10-CM | POA: Diagnosis not present

## 2020-05-20 DIAGNOSIS — Z7982 Long term (current) use of aspirin: Secondary | ICD-10-CM | POA: Diagnosis not present

## 2020-05-20 DIAGNOSIS — M8580 Other specified disorders of bone density and structure, unspecified site: Secondary | ICD-10-CM | POA: Diagnosis not present

## 2020-05-20 DIAGNOSIS — C7951 Secondary malignant neoplasm of bone: Secondary | ICD-10-CM | POA: Insufficient documentation

## 2020-05-20 LAB — CBC AND DIFFERENTIAL
HCT: 40 — AB (ref 41–53)
Hemoglobin: 13.2 — AB (ref 13.5–17.5)
Neutrophils Absolute: 4.23
Platelets: 265 (ref 150–399)
WBC: 7.3

## 2020-05-20 LAB — CBC
MCV: 88 (ref 80–94)
RBC: 4.61 (ref 3.87–5.11)

## 2020-05-20 LAB — PSA: Prostatic Specific Antigen: 23.81 ng/mL — ABNORMAL HIGH (ref 0.00–4.00)

## 2020-05-20 LAB — BASIC METABOLIC PANEL
BUN: 17 (ref 4–21)
CO2: 30 — AB (ref 13–22)
Chloride: 104 (ref 99–108)
Creatinine: 0.8 (ref 0.6–1.3)
Glucose: 139
Potassium: 4.2 (ref 3.4–5.3)
Sodium: 140 (ref 137–147)

## 2020-05-20 LAB — COMPREHENSIVE METABOLIC PANEL
Albumin: 4.5 (ref 3.5–5.0)
Calcium: 9.4 (ref 8.7–10.7)

## 2020-05-20 LAB — HEPATIC FUNCTION PANEL
ALT: 16 (ref 10–40)
AST: 33 (ref 14–40)
Alkaline Phosphatase: 61 (ref 25–125)
Bilirubin, Total: 0.6

## 2020-05-21 ENCOUNTER — Inpatient Hospital Stay: Payer: No Typology Code available for payment source

## 2020-05-21 VITALS — BP 155/72 | HR 53 | Temp 98.2°F | Resp 18 | Ht 65.0 in | Wt 184.2 lb

## 2020-05-21 DIAGNOSIS — C61 Malignant neoplasm of prostate: Secondary | ICD-10-CM | POA: Diagnosis not present

## 2020-05-21 MED ORDER — ALTEPLASE 2 MG IJ SOLR
INTRAMUSCULAR | Status: AC
  Filled 2020-05-21: qty 2

## 2020-05-21 MED ORDER — LEUPROLIDE ACETATE (3 MONTH) 22.5 MG IM KIT
22.5000 mg | PACK | Freq: Once | INTRAMUSCULAR | Status: AC
  Administered 2020-05-21: 22.5 mg via INTRAMUSCULAR

## 2020-05-21 MED ORDER — ZOLEDRONIC ACID 4 MG/100ML IV SOLN
4.0000 mg | Freq: Once | INTRAVENOUS | Status: AC
  Administered 2020-05-21: 4 mg via INTRAVENOUS

## 2020-05-21 MED ORDER — LEUPROLIDE ACETATE (3 MONTH) 22.5 MG IM KIT
PACK | INTRAMUSCULAR | Status: AC
  Filled 2020-05-21: qty 22.5

## 2020-05-21 MED ORDER — SODIUM CHLORIDE 0.9 % IV SOLN
Freq: Once | INTRAVENOUS | Status: AC
  Filled 2020-05-21: qty 250

## 2020-05-21 MED ORDER — HEPARIN SOD (PORK) LOCK FLUSH 100 UNIT/ML IV SOLN
500.0000 [IU] | Freq: Once | INTRAVENOUS | Status: AC | PRN
  Administered 2020-05-21: 500 [IU]
  Filled 2020-05-21: qty 5

## 2020-05-21 MED ORDER — ALTEPLASE 2 MG IJ SOLR
2.0000 mg | Freq: Once | INTRAMUSCULAR | Status: AC | PRN
  Administered 2020-05-21: 2 mg
  Filled 2020-05-21: qty 2

## 2020-05-21 MED ORDER — ZOLEDRONIC ACID 4 MG/100ML IV SOLN
INTRAVENOUS | Status: AC
  Filled 2020-05-21: qty 100

## 2020-05-21 NOTE — Patient Instructions (Signed)
[facility] Discharge Instructions for Patients Receiving Chemotherapy  Today you received the following chemotherapy agents  Leuprolide, Zoledronic Acid. To help prevent nausea and vomiting after your treatment, we encourage you to take your nausea medication.  Zoledronic Acid Injection (Hypercalcemia, Oncology) What is this medicine? ZOLEDRONIC ACID (ZOE le dron ik AS id) slows calcium loss from bones. It high calcium levels in the blood from some kinds of cancer. It may be used in other people at risk for bone loss. This medicine may be used for other purposes; ask your health care provider or pharmacist if you have questions. COMMON BRAND NAME(S): Zometa What should I tell my health care provider before I take this medicine? They need to know if you have any of these conditions:  cancer  dehydration  dental disease  kidney disease  liver disease  low levels of calcium in the blood  lung or breathing disease (asthma)  receiving steroids like dexamethasone or prednisone  an unusual or allergic reaction to zoledronic acid, other medicines, foods, dyes, or preservatives  pregnant or trying to get pregnant  breast-feeding How should I use this medicine? This drug is injected into a vein. It is given by a health care provider in a hospital or clinic setting. Talk to your health care provider about the use of this drug in children. Special care may be needed. Overdosage: If you think you have taken too much of this medicine contact a poison control center or emergency room at once. NOTE: This medicine is only for you. Do not share this medicine with others. What if I miss a dose? Keep appointments for follow-up doses. It is important not to miss your dose. Call your health care provider if you are unable to keep an appointment. What may interact with this medicine?  certain antibiotics given by injection  NSAIDs, medicines for pain and inflammation,  like ibuprofen or naproxen  some diuretics like bumetanide, furosemide  teriparatide  thalidomide This list may not describe all possible interactions. Give your health care provider a list of all the medicines, herbs, non-prescription drugs, or dietary supplements you use. Also tell them if you smoke, drink alcohol, or use illegal drugs. Some items may interact with your medicine. What should I watch for while using this medicine? Visit your health care provider for regular checks on your progress. It may be some time before you see the benefit from this drug. Some people who take this drug have severe bone, joint, or muscle pain. This drug may also increase your risk for jaw problems or a broken thigh bone. Tell your health care provider right away if you have severe pain in your jaw, bones, joints, or muscles. Tell you health care provider if you have any pain that does not go away or that gets worse. Tell your dentist and dental surgeon that you are taking this drug. You should not have major dental surgery while on this drug. See your dentist to have a dental exam and fix any dental problems before starting this drug. Take good care of your teeth while on this drug. Make sure you see your dentist for regular follow-up appointments. You should make sure you get enough calcium and vitamin D while you are taking this drug. Discuss the foods you eat and the vitamins you take with your health care provider. Check with your health care provider if you have severe diarrhea, nausea, and vomiting, or if you sweat a lot. The loss  of too much body fluid may make it dangerous for you to take this drug. You may need blood work done while you are taking this drug. Do not become pregnant while taking this drug. Women should inform their health care provider if they wish to become pregnant or think they might be pregnant. There is potential for serious harm to an unborn child. Talk to your health care provider  for more information. What side effects may I notice from receiving this medicine? Side effects that you should report to your doctor or health care provider as soon as possible:  allergic reactions (skin rash, itching or hives; swelling of the face, lips, or tongue)  bone pain  infection (fever, chills, cough, sore throat, pain or trouble passing urine)  jaw pain, especially after dental work  joint pain  kidney injury (trouble passing urine or change in the amount of urine)  low blood pressure (dizziness; feeling faint or lightheaded, falls; unusually weak or tired)  low calcium levels (fast heartbeat; muscle cramps or pain; pain, tingling, or numbness in the hands or feet; seizures)  low magnesium levels (fast, irregular heartbeat; muscle cramp or pain; muscle weakness; tremors; seizures)  low red blood cell counts (trouble breathing; feeling faint; lightheaded, falls; unusually weak or tired)  muscle pain  redness, blistering, peeling, or loosening of the skin, including inside the mouth  severe diarrhea  swelling of the ankles, feet, hands  trouble breathing Side effects that usually do not require medical attention (report to your doctor or health care provider if they continue or are bothersome):  anxious  constipation  coughing  depressed mood  eye irritation, itching, or pain  fever  general ill feeling or flu-like symptoms  nausea  pain, redness, or irritation at site where injected  trouble sleeping This list may not describe all possible side effects. Call your doctor for medical advice about side effects. You may report side effects to FDA at 1-800-FDA-1088. Where should I keep my medicine? This drug is given in a hospital or clinic. It will not be stored at home. NOTE: This sheet is a summary. It may not cover all possible information. If you have questions about this medicine, talk to your doctor, pharmacist, or health care provider.  2021  Elsevier/Gold Standard (2018-12-14 09:13:00)    Leuprolide injection What is this medicine? LEUPROLIDE (loo PROE lide) is a man-made hormone. It is used to treat the symptoms of prostate cancer. This medicine may also be used to treat children with early onset of puberty. It may be used for other hormonal conditions. This medicine may be used for other purposes; ask your health care provider or pharmacist if you have questions. COMMON BRAND NAME(S): Lupron What should I tell my health care provider before I take this medicine? They need to know if you have any of these conditions:  diabetes  heart disease or previous heart attack  high blood pressure  high cholesterol  pain or difficulty passing urine  spinal cord metastasis  stroke  tobacco smoker  an unusual or allergic reaction to leuprolide, benzyl alcohol, other medicines, foods, dyes, or preservatives  pregnant or trying to get pregnant  breast-feeding How should I use this medicine? This medicine is for injection under the skin or into a muscle. You will be taught how to prepare and give this medicine. Use exactly as directed. Take your medicine at regular intervals. Do not take your medicine more often than directed. It is important that you put  your used needles and syringes in a special sharps container. Do not put them in a trash can. If you do not have a sharps container, call your pharmacist or healthcare provider to get one. A special MedGuide will be given to you by the pharmacist with each prescription and refill. Be sure to read this information carefully each time. Talk to your pediatrician regarding the use of this medicine in children. While this medicine may be prescribed for children as young as 8 years for selected conditions, precautions do apply. Overdosage: If you think you have taken too much of this medicine contact a poison control center or emergency room at once. NOTE: This medicine is only for  you. Do not share this medicine with others. What if I miss a dose? If you miss a dose, take it as soon as you can. If it is almost time for your next dose, take only that dose. Do not take double or extra doses. What may interact with this medicine? Do not take this medicine with any of the following medications:  chasteberry  cisapride  dronedarone  pimozide  thioridazine This medicine may also interact with the following medications:  herbal or dietary supplements, like black cohosh or DHEA  male hormones, like estrogens or progestins and birth control pills, patches, rings, or injections  male hormones, like testosterone  other medicines that prolong the QT interval (abnormal heart rhythm) This list may not describe all possible interactions. Give your health care provider a list of all the medicines, herbs, non-prescription drugs, or dietary supplements you use. Also tell them if you smoke, drink alcohol, or use illegal drugs. Some items may interact with your medicine. What should I watch for while using this medicine? Visit your doctor or health care professional for regular checks on your progress. During the first week, your symptoms may get worse, but then will improve as you continue your treatment. You may get hot flashes, increased bone pain, increased difficulty passing urine, or an aggravation of nerve symptoms. Discuss these effects with your doctor or health care professional, some of them may improve with continued use of this medicine. Male patients may experience a menstrual cycle or spotting during the first 2 months of therapy with this medicine. If this continues, contact your doctor or health care professional. This medicine may increase blood sugar. Ask your healthcare provider if changes in diet or medicines are needed if you have diabetes. What side effects may I notice from receiving this medicine? Side effects that you should report to your doctor or  health care professional as soon as possible:  allergic reactions like skin rash, itching or hives, swelling of the face, lips, or tongue  breathing problems  chest pain  depression or memory disorders  pain in your legs or groin  pain at site where injected  severe headache  signs and symptoms of high blood sugar such as being more thirsty or hungry or having to urinate more than normal. You may also feel very tired or have blurry vision  swelling of the feet and legs  visual changes  vomiting Side effects that usually do not require medical attention (report to your doctor or health care professional if they continue or are bothersome):  breast swelling or tenderness  decrease in sex drive or performance  diarrhea  hot flashes  loss of appetite  muscle, joint, or bone pains  nausea  redness or irritation at site where injected  skin problems or acne This  list may not describe all possible side effects. Call your doctor for medical advice about side effects. You may report side effects to FDA at 1-800-FDA-1088. Where should I keep my medicine? Keep out of the reach of children. Store below 25 degrees C (77 degrees F). Do not freeze. Protect from light. Do not use if it is not clear or if there are particles present. Throw away any unused medicine after the expiration date. NOTE: This sheet is a summary. It may not cover all possible information. If you have questions about this medicine, talk to your doctor, pharmacist, or health care provider.  2021 Elsevier/Gold Standard (2019-01-31 10:57:41)    If you develop nausea and vomiting that is not controlled by your nausea medication, call the clinic.   BELOW ARE SYMPTOMS THAT SHOULD BE REPORTED IMMEDIATELY:  *FEVER GREATER THAN 100.5 F  *CHILLS WITH OR WITHOUT FEVER  NAUSEA AND VOMITING THAT IS NOT CONTROLLED WITH YOUR NAUSEA MEDICATION  *UNUSUAL SHORTNESS OF BREATH  *UNUSUAL BRUISING OR  BLEEDING  TENDERNESS IN MOUTH AND THROAT WITH OR WITHOUT PRESENCE OF ULCERS  *URINARY PROBLEMS  *BOWEL PROBLEMS  UNUSUAL RASH Items with * indicate a potential emergency and should be followed up as soon as possible.  Feel free to call the clinic should you have any questions or concerns at The clinic phone number is [phone].  Please show the [facility] at check-in to the Emergency Department and triage nurse.

## 2020-05-22 ENCOUNTER — Telehealth: Payer: Self-pay

## 2020-05-22 NOTE — Telephone Encounter (Signed)
-----   Message from [name], MD sent at 05/22/2020  1:24 PM EST ----- Regarding: call pt Tell him PSA still going up, now 23.8, up from 16 in December.  We will see what scan shows.

## 2020-05-22 NOTE — Telephone Encounter (Signed)
Patient notified

## 2020-05-27 ENCOUNTER — Other Ambulatory Visit: Payer: Self-pay

## 2020-05-27 DIAGNOSIS — C61 Malignant neoplasm of prostate: Secondary | ICD-10-CM

## 2020-05-27 DIAGNOSIS — C7951 Secondary malignant neoplasm of bone: Secondary | ICD-10-CM

## 2020-05-27 MED ORDER — MORPHINE SULFATE ER 15 MG PO TBCR: 15.0000 mg | EXTENDED_RELEASE_TABLET | Freq: Two times a day (BID) | ORAL | 0 refills | Status: DC

## 2020-06-10 ENCOUNTER — Telehealth: Payer: Self-pay

## 2020-06-10 NOTE — Telephone Encounter (Signed)
-----   Message from [name], MD sent at 06/10/2020 12:19 PM EDT ----- Regarding: RE: [facility]y refill [place], will do ----- Message ----- From: Belva Chimes, LPN Sent: 7/58/8325  10:23 AM EDT To: [name], MD Subject: [facility]y refill                                  Patient is needing a new auth sent for his Xtandi. Need a new script for this, so I can send it to the [place].

## 2020-06-10 NOTE — Telephone Encounter (Signed)
Faxed a copy of script to [place] [facility].

## 2020-06-12 ENCOUNTER — Other Ambulatory Visit: Payer: Self-pay | Admitting: Hematology and Oncology

## 2020-06-12 ENCOUNTER — Other Ambulatory Visit: Payer: Self-pay | Admitting: Oncology

## 2020-06-12 DIAGNOSIS — C61 Malignant neoplasm of prostate: Secondary | ICD-10-CM

## 2020-06-12 DIAGNOSIS — C7951 Secondary malignant neoplasm of bone: Secondary | ICD-10-CM

## 2020-06-12 MED ORDER — ENZALUTAMIDE 40 MG PO TABS: 160.0000 mg | ORAL_TABLET | Freq: Every day | ORAL | 5 refills | Status: DC

## 2020-06-18 ENCOUNTER — Other Ambulatory Visit (HOSPITAL_COMMUNITY): Payer: Self-pay | Admitting: Oncology

## 2020-06-18 DIAGNOSIS — C7951 Secondary malignant neoplasm of bone: Secondary | ICD-10-CM

## 2020-06-18 DIAGNOSIS — C61 Malignant neoplasm of prostate: Secondary | ICD-10-CM

## 2020-06-18 DIAGNOSIS — C7952 Secondary malignant neoplasm of bone marrow: Secondary | ICD-10-CM

## 2020-06-25 ENCOUNTER — Other Ambulatory Visit: Payer: Self-pay

## 2020-06-25 DIAGNOSIS — C7951 Secondary malignant neoplasm of bone: Secondary | ICD-10-CM

## 2020-06-25 DIAGNOSIS — C61 Malignant neoplasm of prostate: Secondary | ICD-10-CM

## 2020-06-25 MED ORDER — MORPHINE SULFATE ER 15 MG PO TBCR: 15.0000 mg | EXTENDED_RELEASE_TABLET | Freq: Two times a day (BID) | ORAL | 0 refills | Status: DC

## 2020-07-02 ENCOUNTER — Ambulatory Visit (HOSPITAL_COMMUNITY): Admission: RE | Admit: 2020-07-02 | Payer: Non-veteran care | Source: Ambulatory Visit

## 2020-07-02 ENCOUNTER — Encounter (HOSPITAL_COMMUNITY): Payer: Self-pay

## 2020-07-09 ENCOUNTER — Ambulatory Visit (HOSPITAL_COMMUNITY)
Admission: RE | Admit: 2020-07-09 | Discharge: 2020-07-09 | Disposition: A | Discharge status: Home / Self Care | Payer: No Typology Code available for payment source | Source: Ambulatory Visit | Attending: Oncology | Admitting: Oncology

## 2020-07-09 ENCOUNTER — Other Ambulatory Visit: Payer: Self-pay

## 2020-07-09 DIAGNOSIS — C7951 Secondary malignant neoplasm of bone: Secondary | ICD-10-CM | POA: Diagnosis present

## 2020-07-09 DIAGNOSIS — C7952 Secondary malignant neoplasm of bone marrow: Secondary | ICD-10-CM | POA: Diagnosis present

## 2020-07-09 DIAGNOSIS — C61 Malignant neoplasm of prostate: Secondary | ICD-10-CM | POA: Insufficient documentation

## 2020-07-09 MED ORDER — PIFLIFOLASTAT F 18 (PYLARIFY) INJECTION
9.0000 | Freq: Once | INTRAVENOUS | Status: AC
  Administered 2020-07-09: 8.9 via INTRAVENOUS

## 2020-07-24 ENCOUNTER — Other Ambulatory Visit: Payer: Self-pay

## 2020-07-24 DIAGNOSIS — C61 Malignant neoplasm of prostate: Secondary | ICD-10-CM

## 2020-07-24 DIAGNOSIS — C7951 Secondary malignant neoplasm of bone: Secondary | ICD-10-CM

## 2020-07-24 MED ORDER — MORPHINE SULFATE ER 15 MG PO TBCR: 15.0000 mg | EXTENDED_RELEASE_TABLET | Freq: Two times a day (BID) | ORAL | 0 refills | Status: DC

## 2020-08-20 ENCOUNTER — Inpatient Hospital Stay
Payer: No Typology Code available for payment source | Attending: Hematology and Oncology | Admitting: Hematology and Oncology

## 2020-08-20 ENCOUNTER — Inpatient Hospital Stay: Payer: No Typology Code available for payment source

## 2020-08-20 ENCOUNTER — Other Ambulatory Visit: Payer: Self-pay

## 2020-08-20 ENCOUNTER — Encounter: Payer: Self-pay | Admitting: Hematology and Oncology

## 2020-08-20 ENCOUNTER — Telehealth: Payer: Self-pay | Admitting: Hematology and Oncology

## 2020-08-20 VITALS — BP 142/68 | HR 55 | Temp 98.3°F | Resp 18 | Ht 65.0 in | Wt 189.3 lb

## 2020-08-20 DIAGNOSIS — M8580 Other specified disorders of bone density and structure, unspecified site: Secondary | ICD-10-CM | POA: Diagnosis not present

## 2020-08-20 DIAGNOSIS — E785 Hyperlipidemia, unspecified: Secondary | ICD-10-CM | POA: Insufficient documentation

## 2020-08-20 DIAGNOSIS — C61 Malignant neoplasm of prostate: Secondary | ICD-10-CM | POA: Diagnosis not present

## 2020-08-20 DIAGNOSIS — Z7901 Long term (current) use of anticoagulants: Secondary | ICD-10-CM | POA: Diagnosis not present

## 2020-08-20 DIAGNOSIS — C7951 Secondary malignant neoplasm of bone: Secondary | ICD-10-CM | POA: Diagnosis not present

## 2020-08-20 DIAGNOSIS — D649 Anemia, unspecified: Secondary | ICD-10-CM | POA: Insufficient documentation

## 2020-08-20 DIAGNOSIS — Z192 Hormone resistant malignancy status: Secondary | ICD-10-CM | POA: Diagnosis not present

## 2020-08-20 DIAGNOSIS — Z79818 Long term (current) use of other agents affecting estrogen receptors and estrogen levels: Secondary | ICD-10-CM | POA: Diagnosis not present

## 2020-08-20 DIAGNOSIS — I471 Supraventricular tachycardia: Secondary | ICD-10-CM | POA: Diagnosis not present

## 2020-08-20 DIAGNOSIS — R9721 Rising PSA following treatment for malignant neoplasm of prostate: Secondary | ICD-10-CM | POA: Insufficient documentation

## 2020-08-20 DIAGNOSIS — R5383 Other fatigue: Secondary | ICD-10-CM | POA: Diagnosis not present

## 2020-08-20 DIAGNOSIS — N62 Hypertrophy of breast: Secondary | ICD-10-CM | POA: Diagnosis not present

## 2020-08-20 DIAGNOSIS — Z79899 Other long term (current) drug therapy: Secondary | ICD-10-CM | POA: Diagnosis not present

## 2020-08-20 LAB — BASIC METABOLIC PANEL
BUN: 15 (ref 4–21)
CO2: 23 — AB (ref 13–22)
Chloride: 105 (ref 99–108)
Creatinine: 0.8 (ref 0.6–1.3)
Glucose: 139
Potassium: 4.2 (ref 3.4–5.3)
Sodium: 137 (ref 137–147)

## 2020-08-20 LAB — CBC AND DIFFERENTIAL
HCT: 39 — AB (ref 41–53)
Hemoglobin: 13.1 — AB (ref 13.5–17.5)
Neutrophils Absolute: 3.92
Platelets: 226 (ref 150–399)
WBC: 7.4

## 2020-08-20 LAB — HEPATIC FUNCTION PANEL
ALT: 18 (ref 10–40)
AST: 31 (ref 14–40)
Alkaline Phosphatase: 55 (ref 25–125)
Bilirubin, Total: 0.6

## 2020-08-20 LAB — PSA: Prostatic Specific Antigen: 18.6 ng/mL — ABNORMAL HIGH (ref 0.00–4.00)

## 2020-08-20 LAB — CBC: RBC: 4.5 (ref 3.87–5.11)

## 2020-08-20 LAB — COMPREHENSIVE METABOLIC PANEL
Albumin: 4.2 (ref 3.5–5.0)
Calcium: 8.9 (ref 8.7–10.7)

## 2020-08-20 MED ORDER — MORPHINE SULFATE ER 15 MG PO TBCR: 15.0000 mg | EXTENDED_RELEASE_TABLET | Freq: Two times a day (BID) | ORAL | 0 refills | Status: DC

## 2020-08-20 NOTE — Progress Notes (Signed)
[facility]  [address] [place],  [place]  [address] [phone]  Clinic Day:  08/21/2020  Referring physician: Marvia Pickles, PA-C   CHIEF COMPLAINT:  CC: Metastatic prostate cancer to bone  Current Treatment:   Enzalutamide 160 mg daily, zoledronic acid and leuprolide every 3 months   HISTORY OF PRESENT ILLNESS:  Marvin Johnson is a 70 y.o. male with a history of stage IV (T3b N1 M0) prostate cancer diagnosed in January 2016 when he was found to have a PSA of 51.8.  He was treated with a laparoscopic prostatectomy in May 2016.  Pathology revealed adenocarcinoma with a Gleason 8.  There was a positive posterior margin with extraprostatic extension, as well as left retro-urethral tissue positive and extension to the seminal vesicles bilaterally with multiple positive margins.  Two lymph nodes from the right side were negative, but 2/4 lymph nodes from the left side were positive for metastasis. Repeat bone scan in June 2016 revealed a new lesion at T10, which was not present on his baseline bone scan.  PSA at Dr. [name] office in early August 2016 remained elevated at 56.4.   We began seeing him in August 2016. The PSA was up to 216.3 in late August 2016, so hormonal therapy was recommended  X-rays of the thoracic spine revealed osteoarthritis and scoliosis, as well as evidence of demineralization, but we could not visualize a metastatic lesion.[name] He started leuprolide in September 2016.  The PSA initially dropped steadily with leuprolide.  Bone density scan in November 2016 revealed osteopenia, with a T-score of -2.1 in the spine and a T-score of -0.9 in the femur.  He had been taking vitamin-D 1000 international units daily, but not calcium, so calcium 600 mg twice daily was added at that time.   Leuprolide was held in February 2017 because of chest pain.  EKG was unremarkable.  He went to the [place] and he was referred to the [facility] for placement of 2  coronary artery stents for 95% and 99% occlusions.  He then had a third coronary artery stent placed later in the summer.  Leuprolide was resumed in March 2017.  In September 2017, he had an increase in the PSA, so we repeated a bone scan.  This revealed intense uptake within the right posterior elements of T10, but no other areas of metastasis.  We then added bicalutamide 50 mg daily to his leuprolide.     He had a steadily increasing PSA despite the bicalutamide, so this was discontinued in March 2018, in hopes that he would respond to withdrawal of the drug. He developed new mild to moderate pain in the right mid back.  He eventually was placed on MS Contin 15 mg twice daily, with oxycodone 10 mg every 4 hours as needed for breakthrough pain.  MRI thoracic spine in April 2018 revealed increasing tumor at T10 with epidural spread.  Bone scan at that time revealed increased activity in the T10 lesion, which was stable.  There was a questionable tiny focus in the right ischium/acetabular rim.  Plain x-rays of the bilateral hips and pelvis did not reveal any focal abnormality. The PSA increased from 27.5 in March, to 72.2 in May, then to 92.2 in June 2018.  He was referred for radiotherapy to the T10 lesion due to the severe pain with improvement in his pain.  He was started on zoledronic acid along with his leuprolide in June 2018.  He had  a Port-A-Cath placed in anticipation of chemotherapy, but then we recommended that he be placed on enzalutamide 160 mg daily instead, in order to delay the time to initiation of chemotherapy.  He started enzalutamide in June 2018.  The PSA became undetectable in October 2018.  We continued MS Contin, but discontinued the oxycodone. He did not tolerate meloxicam, so uses Tylenol as needed for breakthrough pain.    He had a stress test in January 2020 and was referred to [facility] for coronary artery stenting, which was done in February.  Zoledronic acid had  been giving monthly for over 2 years,so was changed to every 3 months in October 2020.   The PSA started to increase in January 2021 at which time it was 0.2.  CT chest, abdomen and pelvis in March 2021 not reveal any lymphadenopathy or soft tissue metastatic disease.  Hepatic steatosis was seen.  There was a stable sclerotic osseous lesion of the T10 vertebral body, as well as a subtle sclerotic lesion of the L3 vertebral body, also possibly a small metastatic lesion.  Whole body bone scan did not reveal continued increased activity of T10 and right acetabulum posteriorly.  There was new uptake at the anterior right third, fourth, and fifth ribs, the adjacent nature of which suggests a traumatic etiology.  No additional scintigraphic abnormalities.  He reported previous injury of his upper right chest with occasional pain of this area.  The PSA was 0.5 in March.  PSA had increased to 3.0 in June.  Axumin PET imaging from July did not reveal any evidence of active prostate carcinoma, local recurrence, or metastatic disease. The PSA has continued to slowly rise and was up to 5.9 in September, 10.9 in October, and 16.3 in December.  We had discussed a repeat Axumin PET scan, but this did not get scheduled.  He had a mammogram in December to evaluate nipple tenderness, which revealed bilateral gynecomastia without any evidence of malignancy.  The PSA went up to 23.81 in March, so an [facility] PET scan was ordered.  INTERVAL HISTORY:  Marvin Johnson is here today for repeat clinical assessment prior to leuprolide and zoledronic acid.  He states he continues enzalutamide 160 mg daily. He states he had his PET scan in April and has not received the results. He denies fevers or chills. He continues MS Contin twice daily and denies pain. He states his legs ache occasionally man Tylenol as effective for this His appetite is good. His weight has increased 5 pounds over last 3 months .  REVIEW OF SYSTEMS:  Review of Systems   Constitutional:  Positive for fatigue (Mild). Negative for appetite change, chills, fever and unexpected weight change.  HENT:   Negative for lump/mass, mouth sores and sore throat.   Respiratory:  Negative for cough and shortness of breath.   Cardiovascular:  Negative for chest pain and leg swelling.  Gastrointestinal:  Negative for abdominal pain, constipation, diarrhea, nausea and vomiting.  Genitourinary:  Negative for difficulty urinating, dysuria, frequency and hematuria.   Musculoskeletal:  Negative for arthralgias, back pain and myalgias.  Skin:  Negative for itching, rash and wound.  Neurological:  Negative for dizziness, extremity weakness, headaches, light-headedness and numbness.  Hematological:  Negative for adenopathy.  Psychiatric/Behavioral:  Negative for depression and sleep disturbance. The patient is not nervous/anxious.   VITALS:  Blood pressure (!) 142/68, pulse (!) 55, temperature 98.3 F (36.8 C), temperature source Oral, resp. rate 18, height 5\' 5"  (1.651 m),  weight 189 lb 4.8 oz (85.9 kg), SpO2 97 %.  Wt Readings from Last 3 Encounters:  08/20/20 189 lb 4.8 oz (85.9 kg)  05/21/20 184 lb 4 oz (83.6 kg)  05/20/20 186 lb 6.4 oz (84.6 kg)    Body mass index is 31.5 kg/m.  Performance status (ECOG): 0 - Asymptomatic  PHYSICAL EXAM:  Physical Exam Vitals and nursing note reviewed.  Constitutional:      General: He is not in acute distress.    Appearance: Normal appearance. He is normal weight.  HENT:     Head: Normocephalic and atraumatic.     Mouth/Throat:     Mouth: Mucous membranes are moist.     Pharynx: Oropharynx is clear. No oropharyngeal exudate or posterior oropharyngeal erythema.  Eyes:     General: No scleral icterus.    Extraocular Movements: Extraocular movements intact.     Conjunctiva/sclera: Conjunctivae normal.     Pupils: Pupils are equal, round, and reactive to light.  Cardiovascular:     Rate and Rhythm: Normal rate and regular rhythm.      Heart sounds: Normal heart sounds. No murmur heard.   No friction rub. No gallop.  Pulmonary:     Effort: Pulmonary effort is normal.     Breath sounds: Normal breath sounds. No wheezing, rhonchi or rales.  Chest:  Breasts:    Right: No axillary adenopathy or supraclavicular adenopathy.     Left: No axillary adenopathy or supraclavicular adenopathy.  Abdominal:     General: Bowel sounds are normal. There is no distension.     Palpations: Abdomen is soft. There is no hepatomegaly, splenomegaly or mass.     Tenderness: There is no abdominal tenderness.  Musculoskeletal:        General: Normal range of motion.     Cervical back: Normal range of motion and neck supple. No tenderness.     Right lower leg: No edema.     Left lower leg: No edema.  Lymphadenopathy:     Cervical: No cervical adenopathy.     Upper Body:     Right upper body: No supraclavicular or axillary adenopathy.     Left upper body: No supraclavicular or axillary adenopathy.     Lower Body: No right inguinal adenopathy. No left inguinal adenopathy.  Skin:    General: Skin is warm and dry.     Coloration: Skin is not jaundiced.     Findings: No rash.  Neurological:     Mental Status: He is alert and oriented to person, place, and time.     Cranial Nerves: No cranial nerve deficit.  Psychiatric:        Mood and Affect: Mood normal.        Behavior: Behavior normal.        Thought Content: Thought content normal.   LABS:   CBC Latest Ref Rng & Units 08/20/2020 05/20/2020 02/20/2020  WBC - 7.4 7.3 6.4  Hemoglobin 13.5 - 17.5 13.1(A) 13.2(A) 13.6  Hematocrit 41 - 53 39(A) 40(A) 41  Platelets 150 - 399 226 265 215   CMP Latest Ref Rng & Units 08/20/2020 05/20/2020 02/20/2020  BUN 4 - 21 15 17 16   Creatinine 0.6 - 1.3 0.8 0.8 0.8  Sodium 137 - 147 137 140 138  Potassium 3.4 - 5.3 4.2 4.2 4.0  Chloride 99 - 108 105 104 101  CO2 13 - 22 23(A) 30(A) 23(A)  Calcium 8.7 - 10.7 8.9 9.4 9.4  Alkaline Phos 25 -  125 55 61  57  AST 14 - 40 31 33 32  ALT 10 - 40 18 16 17      No results found for: CEA1 / No results found for: CEA1 Lab Results  Component Value Date   PSA1 16.3 (H) 02/20/2020   No results found for: EYC144 No results found for: YJE563  No results found for: TOTALPROTELP, ALBUMINELP, A1GS, A2GS, BETS, BETA2SER, GAMS, MSPIKE, SPEI No results found for: TIBC, FERRITIN, IRONPCTSAT No results found for: LDH  STUDIES:  No results found.    HISTORY:   Past Medical History:  Diagnosis Date   Hyperlipidemia    Malignant neoplasm of prostate ([place])    Supraventricular tachycardia [facility])     Past Surgical History:  Procedure Laterality Date   PROSTATECTOMY  07/2014   TONSILLECTOMY      Family History  Problem Relation Age of Onset   Breast cancer Mother 22   Ovarian cancer Mother 32   Breast cancer Paternal Grandmother 30    Social History:  reports that he has never smoked. He has never used smokeless tobacco. He reports previous alcohol use. He reports that he does not use drugs.The patient is alone today.  Allergies:  Allergies  Allergen Reactions   Atorvastatin    Rosuvastatin     Other reaction(s): Muscle pain   Simvastatin     Other reaction(s): Joint pain, Muscle pain   Sulfa Antibiotics    Sulfamethoxazole-Trimethoprim Other (See Comments)    Unknown as to interaction was a baby.    Current Medications: Current Outpatient Medications  Medication Sig Dispense Refill   Calcium Carb-Cholecalciferol 600-400 MG-UNIT TABS Take 1 tablet by mouth 2 (two) times daily.     ezetimibe (ZETIA) 10 MG tablet Take by mouth.     aspirin 81 MG EC tablet Take 81 mg by mouth daily.     Cholecalciferol 25 MCG (1000 UT) tablet Take by mouth.     clopidogrel (PLAVIX) 75 MG tablet Take 75 mg by mouth daily.     diphenhydrAMINE (SOMINEX) 25 MG tablet Take 50 mg by mouth 2 (two) times daily.     enzalutamide (XTANDI) 40 MG tablet Take 4 tablets (160 mg total) by mouth daily. 149 tablet 5    folic acid-vitamin b complex-vitamin c-selenium-zinc (DIALYVITE) 3 MG TABS tablet Take 1 tablet by mouth daily.     isosorbide mononitrate (IMDUR) 30 MG 24 hr tablet Take 1 tablet by mouth every 8 (eight) hours as needed.     leuprolide (LUPRON) 22.5 MG injection Inject 22.5 mg into the muscle every 3 (three) months.     metoprolol tartrate (LOPRESSOR) 25 MG tablet Take 25 mg by mouth daily.     morphine (MS CONTIN) 15 MG 12 hr tablet Take 1 tablet (15 mg total) by mouth every 12 (twelve) hours. 60 tablet 0   Multiple Vitamins-Minerals (MULTIVITAMIN WITH MINERALS) tablet Take 1 tablet by mouth daily.     Omega-3 Fatty Acids (FISH OIL) 1000 MG CAPS Take by mouth.     rosuvastatin (CRESTOR) 10 MG tablet Take 10 mg by mouth daily.     Royal Jelly-Bee Pollen-Ginseng (KOREAN GINSENG COMPLEX) 150-250-50 MG CAPS Take by mouth.     senna (SENOKOT) 8.6 MG TABS tablet Take 1 tablet by mouth.     Zoledronic Acid (ZOMETA) 4 MG/100ML IVPB Inject 4 mg into the vein.     No current facility-administered medications for this visit.     ASSESSMENT & PLAN:  Assessment:   1. 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 He continues enzalutamide 160 mg daily, as well as leuprolide 22.5 mg zoledronic acid every 3 months.  His pain is controlled with MS Contin 15 mg twice daily with occasional Tylenol for breakthrough.   He has been taking enzalutamide  40 mg 1 capsule 4 times a day.  I instructed him to take all 4 capsules at once.   2. Steadily rising PSA.  AXUMIN PET in April revealed an oligometastasis skeletal lesion it in the anterior right 7th rib with very subtle sclerotic change at this level.  There is no evidence of local recurrence in the prostatectomy bed in no evidence of metastatic lymphadenopathy or visceral metastasis.  PSA is pending from today.   3. Bilateral gynecomastia, likely due to medication.  Bilateral mammogram in December 2021 was negative for  malignancy.  4. Mild normochromic, normocytic anemia likely due to chronic disease, which is stable.  Plan:  As he has a single metastatic lesion in the right 7th rib, we will continue the current therapy.  He knows to continue enzalutamide 160 mg daily. We will plan to see him back in 3 months with a CBC, comprehensive metabolic panel and PSA prior to his next leuprolide and zoledronic acid.  The patient understands the plans discussed today and is in agreement with them.  He knows to contact our office if he develops concerns prior to his next appointment.      Bryn Perkin A Amaiah Cristiano, PA-C     Addendum:  The patient's PSA has decreased to 18.6 from 23.8.

## 2020-08-20 NOTE — Telephone Encounter (Signed)
Per 6/8 LOS, patient scheduled for 6/10 Injection - Aug Appt's.  Gave patient Appt Summary

## 2020-08-21 ENCOUNTER — Other Ambulatory Visit: Payer: Self-pay | Admitting: Pharmacist

## 2020-08-21 ENCOUNTER — Telehealth: Payer: Self-pay

## 2020-08-21 ENCOUNTER — Encounter: Payer: Self-pay | Admitting: Oncology

## 2020-08-21 ENCOUNTER — Encounter: Payer: Self-pay | Admitting: Hematology and Oncology

## 2020-08-21 NOTE — Telephone Encounter (Signed)
Patient notified

## 2020-08-21 NOTE — Telephone Encounter (Signed)
-----   Message from Marvia Pickles, PA-C sent at 08/21/2020 10:58 AM EDT ----- Please let him know his PSA is better at 18.6 compared to 23.1. Thanks

## 2020-08-22 ENCOUNTER — Inpatient Hospital Stay: Payer: No Typology Code available for payment source

## 2020-08-22 ENCOUNTER — Other Ambulatory Visit: Payer: Self-pay

## 2020-08-22 VITALS — BP 142/76 | HR 55 | Temp 97.9°F | Resp 18 | Ht 65.0 in | Wt 189.0 lb

## 2020-08-22 DIAGNOSIS — C61 Malignant neoplasm of prostate: Secondary | ICD-10-CM | POA: Diagnosis not present

## 2020-08-22 MED ORDER — ZOLEDRONIC ACID 4 MG/100ML IV SOLN
INTRAVENOUS | Status: AC
  Filled 2020-08-22: qty 100

## 2020-08-22 MED ORDER — ALTEPLASE 2 MG IJ SOLR
2.0000 mg | Freq: Once | INTRAMUSCULAR | Status: DC | PRN
  Filled 2020-08-22: qty 2

## 2020-08-22 MED ORDER — SODIUM CHLORIDE 0.9 % IV SOLN
Freq: Once | INTRAVENOUS | Status: AC
  Filled 2020-08-22: qty 250

## 2020-08-22 MED ORDER — ZOLEDRONIC ACID 4 MG/100ML IV SOLN
4.0000 mg | Freq: Once | INTRAVENOUS | Status: AC
  Administered 2020-08-22: 4 mg via INTRAVENOUS

## 2020-08-22 MED ORDER — SODIUM CHLORIDE 0.9% FLUSH
10.0000 mL | Freq: Once | INTRAVENOUS | Status: DC | PRN
  Filled 2020-08-22: qty 10

## 2020-08-22 MED ORDER — LEUPROLIDE ACETATE (3 MONTH) 22.5 MG IM KIT
22.5000 mg | PACK | Freq: Once | INTRAMUSCULAR | Status: AC
  Administered 2020-08-22: 22.5 mg via INTRAMUSCULAR

## 2020-08-22 MED ORDER — HEPARIN SOD (PORK) LOCK FLUSH 100 UNIT/ML IV SOLN
500.0000 [IU] | Freq: Once | INTRAVENOUS | Status: AC | PRN
  Administered 2020-08-22: 500 [IU]
  Filled 2020-08-22: qty 5

## 2020-08-22 MED ORDER — LEUPROLIDE ACETATE (3 MONTH) 22.5 MG IM KIT
PACK | INTRAMUSCULAR | Status: AC
  Filled 2020-08-22: qty 22.5

## 2020-08-22 MED ORDER — HEPARIN SOD (PORK) LOCK FLUSH 100 UNIT/ML IV SOLN
250.0000 [IU] | Freq: Once | INTRAVENOUS | Status: DC | PRN
  Filled 2020-08-22: qty 5

## 2020-08-22 MED ORDER — SODIUM CHLORIDE 0.9% FLUSH
3.0000 mL | Freq: Once | INTRAVENOUS | Status: DC | PRN
  Filled 2020-08-22: qty 10

## 2020-08-22 NOTE — Progress Notes (Signed)
1520:PT STABLE AT TIME OF DISCHARGE ? ?

## 2020-08-22 NOTE — Patient Instructions (Signed)
Leuprolide injection What is this medication? LEUPROLIDE (loo PROE lide) is a man-made hormone. It is used to treat the symptoms of prostate cancer. This medicine may also be used to treat childrenwith early onset of puberty. It may be used for other hormonal conditions. This medicine may be used for other purposes; ask your health care provider orpharmacist if you have questions. COMMON BRAND NAME(S): Lupron What should I tell my care team before I take this medication? They need to know if you have any of these conditions: diabetes heart disease or previous heart attack high blood pressure high cholesterol pain or difficulty passing urine spinal cord metastasis stroke tobacco smoker an unusual or allergic reaction to leuprolide, benzyl alcohol, other medicines, foods, dyes, or preservatives pregnant or trying to get pregnant breast-feeding How should I use this medication? This medicine is for injection under the skin or into a muscle. You will be taught how to prepare and give this medicine. Use exactly as directed. Take your medicine at regular intervals. Do not take your medicine more often thandirected. It is important that you put your used needles and syringes in a special sharps container. Do not put them in a trash can. If you do not have a sharpscontainer, call your pharmacist or healthcare provider to get one. A special MedGuide will be given to you by the pharmacist with eachprescription and refill. Be sure to read this information carefully each time. Talk to your pediatrician regarding the use of this medicine in children. While this medicine may be prescribed for children as young as 8 years for selectedconditions, precautions do apply. Overdosage: If you think you have taken too much of this medicine contact apoison control center or emergency room at once. NOTE: This medicine is only for you. Do not share this medicine with others. What if I miss a dose? If you miss a  dose, take it as soon as you can. If it is almost time for yournext dose, take only that dose. Do not take double or extra doses. What may interact with this medication? Do not take this medicine with any of the following medications: chasteberry cisapride dronedarone pimozide thioridazine This medicine may also interact with the following medications: herbal or dietary supplements, like black cohosh or DHEA male hormones, like estrogens or progestins and birth control pills, patches, rings, or injections male hormones, like testosterone other medicines that prolong the QT interval (abnormal heart rhythm) This list may not describe all possible interactions. Give your health care provider a list of all the medicines, herbs, non-prescription drugs, or dietary supplements you use. Also tell them if you smoke, drink alcohol, or use illegaldrugs. Some items may interact with your medicine. What should I watch for while using this medication? Visit your doctor or health care professional for regular checks on your progress. During the first week, your symptoms may get worse, but then will improve as you continue your treatment. You may get hot flashes, increased bone pain, increased difficulty passing urine, or an aggravation of nerve symptoms. Discuss these effects with your doctor or health care professional, some ofthem may improve with continued use of this medicine. Male patients may experience a menstrual cycle or spotting during the first 2 months of therapy with this medicine. If this continues, contact your doctor orhealth care professional. This medicine may increase blood sugar. Ask your healthcare provider if changesin diet or medicines are needed if you have diabetes. What side effects may I notice from receiving this medication? Side   effects that you should report to your doctor or health care professionalas soon as possible: allergic reactions like skin rash, itching or hives,  swelling of the face, lips, or tongue breathing problems chest pain depression or memory disorders pain in your legs or groin pain at site where injected severe headache signs and symptoms of high blood sugar such as being more thirsty or hungry or having to urinate more than normal. You may also feel very tired or have blurry vision swelling of the feet and legs visual changes vomiting Side effects that usually do not require medical attention (report to yourdoctor or health care professional if they continue or are bothersome): breast swelling or tenderness decrease in sex drive or performance diarrhea hot flashes loss of appetite muscle, joint, or bone pains nausea redness or irritation at site where injected skin problems or acne This list may not describe all possible side effects. Call your doctor for medical advice about side effects. You may report side effects to FDA at1-800-FDA-1088. Where should I keep my medication? Keep out of the reach of children. Store below 25 degrees C (77 degrees F). Do not freeze. Protect from light. Do not use if it is not clear or if there are particles present. Throw away anyunused medicine after the expiration date. NOTE: This sheet is a summary. It may not cover all possible information. If you have questions about this medicine, talk to your doctor, pharmacist, orhealth care provider.  2022 [employer]/Gold Standard (2019-01-31 10:57:41) Zoledronic Acid Injection (Hypercalcemia, Oncology) What is this medication? ZOLEDRONIC ACID (ZOE le dron ik AS id) slows calcium loss from bones. It high calcium levels in the blood from some kinds of cancer. It may be used in otherpeople at risk for bone loss. This medicine may be used for other purposes; ask your health care provider orpharmacist if you have questions. COMMON BRAND NAME(S): Zometa What should I tell my care team before I take this medication? They need to know if you have any of these  conditions: cancer dehydration dental disease kidney disease liver disease low levels of calcium in the blood lung or breathing disease (asthma) receiving steroids like dexamethasone or prednisone an unusual or allergic reaction to zoledronic acid, other medicines, foods, dyes, or preservatives pregnant or trying to get pregnant breast-feeding How should I use this medication? This drug is injected into a vein. It is given by a health care provider in [place] or clinic setting. Talk to your health care provider about the use of this drug in children.Special care may be needed. Overdosage: If you think you have taken too much of this medicine contact apoison control center or emergency room at once. NOTE: This medicine is only for you. Do not share this medicine with others. What if I miss a dose? Keep appointments for follow-up doses. It is important not to miss your dose.Call your health care provider if you are unable to keep an appointment. What may interact with this medication? certain antibiotics given by injection NSAIDs, medicines for pain and inflammation, like ibuprofen or naproxen some diuretics like bumetanide, furosemide teriparatide thalidomide This list may not describe all possible interactions. Give your health care provider a list of all the medicines, herbs, non-prescription drugs, or dietary supplements you use. Also tell them if you smoke, drink alcohol, or use illegaldrugs. Some items may interact with your medicine. What should I watch for while using this medication? Visit your health care provider for regular checks on your progress. It may besome  time before you see the benefit from this drug. Some people who take this drug have severe bone, joint, or muscle pain. This drug may also increase your risk for jaw problems or a broken thigh bone. Tell your health care provider right away if you have severe pain in your jaw, bones, joints, or muscles. Tell you health  care provider if you have any painthat does not go away or that gets worse. Tell your dentist and dental surgeon that you are taking this drug. You should not have major dental surgery while on this drug. See your dentist to have a dental exam and fix any dental problems before starting this drug. Take good care of your teeth while on this drug. Make sure you see your dentist forregular follow-up appointments. You should make sure you get enough calcium and vitamin D while you are taking this drug. Discuss the foods you eat and the vitamins you take with your healthcare provider. Check with your health care provider if you have severe diarrhea, nausea, and vomiting, or if you sweat a lot. The loss of too much body fluid may make itdangerous for you to take this drug. You may need blood work done while you are taking this drug. Do not become pregnant while taking this drug. Women should inform their health care provider if they wish to become pregnant or think they might be pregnant. There is potential for serious harm to an unborn child. Talk to your healthcare provider for more information. What side effects may I notice from receiving this medication? Side effects that you should report to your doctor or health care provider assoon as possible: allergic reactions (skin rash, itching or hives; swelling of the face, lips, or tongue) bone pain infection (fever, chills, cough, sore throat, pain or trouble passing urine) jaw pain, especially after dental work joint pain kidney injury (trouble passing urine or change in the amount of urine) low blood pressure (dizziness; feeling faint or lightheaded, falls; unusually weak or tired) low calcium levels (fast heartbeat; muscle cramps or pain; pain, tingling, or numbness in the hands or feet; seizures) low magnesium levels (fast, irregular heartbeat; muscle cramp or pain; muscle weakness; tremors; seizures) low red blood cell counts (trouble breathing;  feeling faint; lightheaded, falls; unusually weak or tired) muscle pain redness, blistering, peeling, or loosening of the skin, including inside the mouth severe diarrhea swelling of the ankles, feet, hands trouble breathing Side effects that usually do not require medical attention (report to yourdoctor or health care provider if they continue or are bothersome): anxious constipation coughing depressed mood eye irritation, itching, or pain fever general ill feeling or flu-like symptoms nausea pain, redness, or irritation at site where injected trouble sleeping This list may not describe all possible side effects. Call your doctor for medical advice about side effects. You may report side effects to FDA at1-800-FDA-1088. Where should I keep my medication? This drug is given in a hospital or clinic. It will not be stored at home. NOTE: This sheet is a summary. It may not cover all possible information. If you have questions about this medicine, talk to your doctor, pharmacist, orhealth care provider.  2022 [employer]/Gold Standard (2018-12-14 09:13:00)

## 2020-09-19 ENCOUNTER — Other Ambulatory Visit: Payer: Self-pay

## 2020-09-19 DIAGNOSIS — C7951 Secondary malignant neoplasm of bone: Secondary | ICD-10-CM

## 2020-09-19 DIAGNOSIS — C61 Malignant neoplasm of prostate: Secondary | ICD-10-CM

## 2020-09-19 MED ORDER — MORPHINE SULFATE ER 15 MG PO TBCR: 15.0000 mg | EXTENDED_RELEASE_TABLET | Freq: Two times a day (BID) | ORAL | 0 refills | Status: DC

## 2020-10-06 IMAGING — PT NM PET SKULL BASE TO THIGH
1 of 7 series · 1 of 25 positions shown · non-contrast
Comparison: Bone scan 06/12/2019.  CT scan 06/12/2019

CLINICAL DATA: Prostate carcinoma with biochemical recurrence.

EXAM:
NUCLEAR MEDICINE PET SKULL BASE TO THIGH
TECHNIQUE: 9.9 mCi F-18 Fluciclovine was injected intravenously. Full-ring PET
imaging was performed from the skull base to thigh after the
radiotracer. CT data was obtained and used for attenuation
correction and anatomic localization.

[Series 3: pet sk_thigh ac · axial · 5.0mm · 4.07mm/px · 1 of 231 slices shown]
[im 139/231]
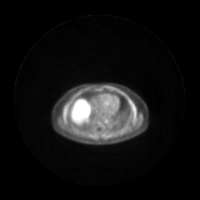

[1 of 25 positions shown; findings below may reference images not displayed]

FINDINGS: NECK

No radiotracer activity in neck lymph nodes.

Incidental CT finding: Port in the anterior chest wall with tip in
distal SVC.

CHEST

No radiotracer accumulation within mediastinal or hilar lymph nodes.
No suspicious pulmonary nodules on the CT scan.

Incidental CT finding: None

ABDOMEN/PELVIS

Prostate: No focal activity in the prostate bed.

Lymph nodes: No abnormal radiotracer accumulation within pelvic or
abdominal nodes.

Liver: No evidence of liver metastasis

Incidental CT finding: None

SKELETON

Sclerotic lesion the RIGHT pedicle of the T10 thoracic vertebral
body without radiotracer activity. No change from comparison CT.

There is photopenia involving the T10-T12 vertebral bodies
consistent prior radiation treatment
IMPRESSION: 1. No evidence of active prostate carcinoma on Fluciclovine PET-CT
scan.
2. No evidence local recurrence in prostate bed.
3. No evidence metastatic adenopathy, visceral disease or skeletal
metastasis

## 2020-10-22 ENCOUNTER — Other Ambulatory Visit: Payer: Self-pay

## 2020-10-22 DIAGNOSIS — C61 Malignant neoplasm of prostate: Secondary | ICD-10-CM

## 2020-10-22 DIAGNOSIS — C7951 Secondary malignant neoplasm of bone: Secondary | ICD-10-CM

## 2020-10-22 MED ORDER — MORPHINE SULFATE ER 15 MG PO TBCR: 15.0000 mg | EXTENDED_RELEASE_TABLET | Freq: Two times a day (BID) | ORAL | 0 refills | Status: DC

## 2020-11-04 NOTE — Progress Notes (Signed)
[name]  [address] [place],  [place]  [address] [phone]  Clinic Day:  11/12/20  Referring physician: Earline Mayotte, MD  This document serves as a record of services personally performed by Marvin Poisson, MD. It was created on their behalf by Marvin Johnson, a trained medical scribe. The creation of this record is based on the scribe's personal observations and the provider's statements to them.  CHIEF COMPLAINT:  CC: Metastatic prostate cancer to bone  Current Treatment:   Enzalutamide 160 mg daily, zoledronic acid and leuprolide every 3 months   HISTORY OF PRESENT ILLNESS:  Marvin Johnson is a 70 y.o. male with a history of stage IV (T3b N1 M0) prostate cancer diagnosed in January 2016 when he was found to have a PSA of 51.8.  He was treated with a laparoscopic prostatectomy in May 2016.  Pathology revealed adenocarcinoma with a Gleason 8.  There was a positive posterior margin with extraprostatic extension, as well as left retro-urethral tissue positive and extension to the seminal vesicles bilaterally with multiple positive margins.  Two lymph nodes from the right side were negative, but 2/4 lymph nodes from the left side were positive for metastasis. Repeat bone scan in June 2016 revealed a new lesion at T10, which was not present on his baseline bone scan.  PSA at [facility] office in early August 2016 remained elevated at 56.4.   We began seeing him in August 2016. The PSA was up to 216.3 in late August 2016, so hormonal therapy was recommended  X-rays of the thoracic spine revealed osteoarthritis and scoliosis, as well as evidence of demineralization, but we could not visualize a metastatic lesion.[name] He started leuprolide in September 2016.  The PSA initially dropped steadily with leuprolide.  Bone density scan in November 2016 revealed osteopenia, with a T-score of -2.1 in the spine and a T-score of -0.9 in the femur.  He had been  taking vitamin-D 1000 international units daily, but not calcium, so calcium 600 mg twice daily was added at that time.   Leuprolide was held in February 2017 because of chest pain.  EKG was unremarkable.  He went to the [place] and he was referred to the [facility] for placement of 2 coronary artery stents for 95% and 99% occlusions.  He then had a third coronary artery stent placed later in the summer.  Leuprolide was resumed in March 2017.  In September 2017, he had an increase in the PSA, so we repeated a bone scan.  This revealed intense uptake within the right posterior elements of T10, but no other areas of metastasis.  We then added bicalutamide 50 mg daily to his leuprolide.     He had a steadily increasing PSA despite the bicalutamide, so this was discontinued in March 2018, in hopes that he would respond to withdrawal of the drug. He developed new mild to moderate pain in the right mid back.  He eventually was placed on MS Contin 15 mg twice daily, with oxycodone 10 mg every 4 hours as needed for breakthrough pain.  MRI thoracic spine in April 2018 revealed increasing tumor at T10 with epidural spread.  Bone scan at that time revealed increased activity in the T10 lesion, which was stable.  There was a questionable tiny focus in the right ischium/acetabular rim.  Plain x-rays of the bilateral hips and pelvis did not reveal any focal abnormality. The PSA increased from 27.5 in March, to 72.2 in  May, then to 92.2 in June 2018.  He was referred for radiotherapy to the T10 lesion due to the severe pain with improvement in his pain.  He was started on zoledronic acid along with his leuprolide in June 2018.  He had a Port-A-Cath placed in anticipation of chemotherapy, but then we recommended that he be placed on enzalutamide 160 mg daily instead, in order to delay the time to initiation of chemotherapy.  He started enzalutamide in June 2018.  The PSA became undetectable in October 2018.  We continued MS Contin,  but discontinued the oxycodone. He did not tolerate meloxicam, so uses Tylenol as needed for breakthrough pain.    He had a stress test in January 2020 and was referred to [facility] for coronary artery stenting, which was done in February.  Zoledronic acid had been giving monthly for over 2 years,so was changed to every 3 months in October 2020.   The PSA started to increase in January 2021 at which time it was 0.2.  CT chest, abdomen and pelvis in March 2021 not reveal any lymphadenopathy or soft tissue metastatic disease.  Hepatic steatosis was seen.  There was a stable sclerotic osseous lesion of the T10 vertebral body, as well as a subtle sclerotic lesion of the L3 vertebral body, also possibly a small metastatic lesion.  Whole body bone scan did not reveal continued increased activity of T10 and right acetabulum posteriorly.  There was new uptake at the anterior right third, fourth, and fifth ribs, the adjacent nature of which suggests a traumatic etiology.  No additional scintigraphic abnormalities.  He reported previous injury of his upper right chest with occasional pain of this area.  The PSA was 0.5 in March.  PSA had increased to 3.0 in June.  Axumin PET imaging from July did not reveal any evidence of active prostate carcinoma, local recurrence, or metastatic disease. The PSA has continued to slowly rise and was up to 5.9 in September, 10.9 in October, and 16.3 in December.  He had a mammogram in December to evaluate nipple tenderness, which revealed bilateral gynecomastia without any evidence of malignancy.  The PSA went up to 23.81 in March 2022, so an [place] PET scan was ordered which revealed an oligometastasis skeletal lesion it in the anterior right 7th rib with very subtle sclerotic change at this level.  There is no evidence of local recurrence in the prostatectomy bed in no evidence of metastatic lymphadenopathy or visceral metastasis.   INTERVAL HISTORY:  Marvin Johnson is here  for routine follow up prior to his next leuprolide and zoledronic acid.  He continues enzalutamide 4 pills daily.  He states that he is well and denies complaints.  He denies bone pain.  He does report mild right shoulder pain from trying to start a mower.  Blood counts and chemistries are unremarkable except for a stable to mildly improved hemoglobin of 13.4.  The last PSA from June improved from 23.81 to 18.6.  His  appetite is good, and he has lost 4 pounds since his last visit.  He denies fever, chills or other signs of infection.  He denies nausea, vomiting, bowel issues, or abdominal pain.  He denies sore throat, cough, dyspnea, or chest pain.  REVIEW OF SYSTEMS:  Review of Systems  Constitutional: Negative.  Negative for appetite change, chills, fatigue, fever and unexpected weight change.  HENT:  Negative.    Eyes: Negative.   Respiratory: Negative.  Negative for chest tightness, cough,  hemoptysis, shortness of breath and wheezing.   Cardiovascular: Negative.  Negative for chest pain, leg swelling and palpitations.  Gastrointestinal: Negative.  Negative for abdominal distention, abdominal pain, blood in stool, constipation, diarrhea, nausea and vomiting.  Endocrine: Negative.   Genitourinary: Negative.  Negative for difficulty urinating, dysuria, frequency and hematuria.   Musculoskeletal:  Negative for arthralgias, back pain, flank pain, gait problem and myalgias.       Mild right shoulder pain  Skin: Negative.   Neurological: Negative.  Negative for dizziness, extremity weakness, gait problem, headaches, light-headedness, numbness, seizures and speech difficulty.  Hematological: Negative.   Psychiatric/Behavioral: Negative.  Negative for depression and sleep disturbance. The patient is not nervous/anxious.   All other systems reviewed and are negative. VITALS:  Blood pressure (!) 153/68, pulse (!) 55, temperature 98.3 F (36.8 C), temperature source Oral, resp. rate 18, height '5\' 5"'$   (1.651 m), weight 184 lb 11.2 oz (83.8 kg), SpO2 97 %.  Wt Readings from Last 3 Encounters:  11/14/20 185 lb 7 oz (84.1 kg)  11/12/20 184 lb 11.2 oz (83.8 kg)  08/22/20 189 lb (85.7 kg)    Body mass index is 30.74 kg/m.  Performance status (ECOG): 0 - Asymptomatic  PHYSICAL EXAM:  Physical Exam Constitutional:      General: He is not in acute distress.    Appearance: Normal appearance. He is normal weight.  HENT:     Head: Normocephalic and atraumatic.  Eyes:     General: No scleral icterus.    Extraocular Movements: Extraocular movements intact.     Conjunctiva/sclera: Conjunctivae normal.     Pupils: Pupils are equal, round, and reactive to light.  Cardiovascular:     Rate and Rhythm: Regular rhythm. Bradycardia present.     Pulses: Normal pulses.     Heart sounds: Normal heart sounds. No murmur heard.   No friction rub. No gallop.  Pulmonary:     Effort: Pulmonary effort is normal. No respiratory distress.     Breath sounds: Normal breath sounds.  Abdominal:     General: Bowel sounds are normal. There is no distension.     Palpations: Abdomen is soft. There is no hepatomegaly, splenomegaly or mass.     Tenderness: There is no abdominal tenderness.  Musculoskeletal:        General: Normal range of motion.     Cervical back: Normal range of motion and neck supple.     Right lower leg: No edema.     Left lower leg: No edema.  Lymphadenopathy:     Cervical: No cervical adenopathy.  Skin:    General: Skin is warm and dry.  Neurological:     General: No focal deficit present.     Mental Status: He is alert and oriented to person, place, and time. Mental status is at baseline.  Psychiatric:        Mood and Affect: Mood normal.        Behavior: Behavior normal.        Thought Content: Thought content normal.        Judgment: Judgment normal.   LABS:   CBC Latest Ref Rng & Units 11/12/2020 08/20/2020 05/20/2020  WBC - 7.2 7.4 7.3  Hemoglobin 13.5 - 17.5 13.4(A) 13.1(A)  13.2(A)  Hematocrit 41 - 53 40(A) 39(A) 40(A)  Platelets 150 - 399 213 226 265   CMP Latest Ref Rng & Units 11/12/2020 08/20/2020 05/20/2020  BUN 4 - '21 14 15 17  '$ Creatinine 0.6 -  1.3 0.8 0.8 0.8  Sodium 137 - 147 138 137 140  Potassium 3.4 - 5.3 3.7 4.2 4.2  Chloride 99 - 108 105 105 104  CO2 13 - 22 22 23(A) 30(A)  Calcium 8.7 - 10.7 9.1 8.9 9.4  Alkaline Phos 25 - 125 55 55 61  AST 14 - 40 31 31 33  ALT 10 - 40 '16 18 16      '$ Lab Results  Component Value Date   PSA1 48.3 (H) 11/12/2020     STUDIES:  No results found.    HISTORY:   Allergies:  Allergies  Allergen Reactions   Atorvastatin    Rosuvastatin     Other reaction(s): Muscle pain   Simvastatin     Other reaction(s): Joint pain, Muscle pain   Sulfa Antibiotics    Sulfamethoxazole-Trimethoprim Other (See Comments)    Unknown as to interaction was a baby.    Current Medications: Current Outpatient Medications  Medication Sig Dispense Refill   aspirin 81 MG EC tablet Take 81 mg by mouth daily.     Calcium Carb-Cholecalciferol 600-400 MG-UNIT TABS Take 1 tablet by mouth 2 (two) times daily.     Cholecalciferol 25 MCG (1000 UT) tablet Take by mouth.     clopidogrel (PLAVIX) 75 MG tablet Take 75 mg by mouth daily.     diphenhydrAMINE (SOMINEX) 25 MG tablet Take 50 mg by mouth 2 (two) times daily.     enzalutamide (XTANDI) 40 MG tablet Take 4 tablets (160 mg total) by mouth daily. 120 tablet 5   ezetimibe (ZETIA) 10 MG tablet Take by mouth.     ezetimibe (ZETIA) 10 MG tablet Take 10 tablets by mouth daily. Take 1/2 tab (Patient not taking: Reported on [id number])     folic acid-vitamin b complex-vitamin c-selenium-zinc (DIALYVITE) 3 MG TABS tablet Take 1 tablet by mouth daily.     isosorbide mononitrate (IMDUR) 30 MG 24 hr tablet Take 1 tablet by mouth every 8 (eight) hours as needed.     leuprolide (LUPRON) 22.5 MG injection Inject 22.5 mg into the muscle every 3 (three) months.     metoprolol tartrate  (LOPRESSOR) 25 MG tablet Take 25 mg by mouth daily.     morphine (MS CONTIN) 15 MG 12 hr tablet Take 1 tablet (15 mg total) by mouth every 12 (twelve) hours. 60 tablet 0   Multiple Vitamins-Minerals (MULTIVITAMIN WITH MINERALS) tablet Take 1 tablet by mouth daily.     Omega-3 Fatty Acids (FISH OIL) 1000 MG CAPS Take by mouth.     rosuvastatin (CRESTOR) 10 MG tablet Take 10 mg by mouth daily.     Royal Jelly-Bee Pollen-Ginseng (KOREAN GINSENG COMPLEX) 150-250-50 MG CAPS Take by mouth.     senna (SENOKOT) 8.6 MG TABS tablet Take 1 tablet by mouth.     Zoledronic Acid (ZOMETA) 4 MG/100ML IVPB Inject 4 mg into the vein.     No current facility-administered medications for this visit.     ASSESSMENT & PLAN:   Assessment:  1. 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 He continues enzalutamide 160 mg daily, as well as leuprolide 22.5 mg zoledronic acid every 3 months.  His pain is controlled with MS Contin 15 mg twice daily with occasional Tylenol for breakthrough.   He has been taking enzalutamide  40 mg 1 capsule 4 times a day.  I instructed him to take all 4 capsules at once.   2. Steadily rising PSA.  AXUMIN  PET in April 2022 revealed an oligometastasis skeletal lesion it in the anterior right 7th rib with very subtle sclerotic change at this level.  There is no evidence of local recurrence in the prostatectomy bed in no evidence of metastatic lymphadenopathy or visceral metastasis.  PSA from June was improved at 18.6.   3. Bilateral gynecomastia, likely due to medication.  Bilateral mammogram in December 2021 was negative for malignancy.  4. Mild normochromic, normocytic anemia likely due to chronic disease, which is stable.  Plan:   He will proceed with leuprolide and zoledronic acid on Friday.  He knows to continue enzalutamide 160 mg daily. We will plan to see him back in 3 months with a CBC, comprehensive metabolic panel and PSA prior to his next leuprolide and  zoledronic acid.  The patient understands the plans discussed today and is in agreement with them.  He knows to contact our office if he develops concerns prior to his next appointment.  ADDENDUM:  His PSA has increased from 18 to 48 so we will need to consider repeat scanning and change in therapy.   I, Rita Ohara, am acting as scribe for [name], MD  I have reviewed this report as typed by the medical scribe, and it is complete and accurate.

## 2020-11-12 ENCOUNTER — Inpatient Hospital Stay (INDEPENDENT_AMBULATORY_CARE_PROVIDER_SITE_OTHER): Payer: No Typology Code available for payment source | Admitting: Oncology

## 2020-11-12 ENCOUNTER — Encounter: Payer: Self-pay | Admitting: Oncology

## 2020-11-12 ENCOUNTER — Inpatient Hospital Stay: Payer: No Typology Code available for payment source | Attending: Hematology and Oncology

## 2020-11-12 ENCOUNTER — Other Ambulatory Visit: Payer: Self-pay

## 2020-11-12 ENCOUNTER — Other Ambulatory Visit: Payer: Self-pay | Admitting: Oncology

## 2020-11-12 ENCOUNTER — Telehealth: Payer: Self-pay | Admitting: Oncology

## 2020-11-12 VITALS — BP 153/68 | HR 55 | Temp 98.3°F | Resp 18 | Ht 65.0 in | Wt 184.7 lb

## 2020-11-12 DIAGNOSIS — C7952 Secondary malignant neoplasm of bone marrow: Secondary | ICD-10-CM | POA: Diagnosis not present

## 2020-11-12 DIAGNOSIS — C7951 Secondary malignant neoplasm of bone: Secondary | ICD-10-CM

## 2020-11-12 DIAGNOSIS — C61 Malignant neoplasm of prostate: Secondary | ICD-10-CM

## 2020-11-12 LAB — HEPATIC FUNCTION PANEL
ALT: 16 (ref 10–40)
AST: 31 (ref 14–40)
Alkaline Phosphatase: 55 (ref 25–125)
Bilirubin, Total: 0.4

## 2020-11-12 LAB — CBC AND DIFFERENTIAL
HCT: 40 — AB (ref 41–53)
Hemoglobin: 13.4 — AB (ref 13.5–17.5)
Neutrophils Absolute: 3.74
Platelets: 213 (ref 150–399)
WBC: 7.2

## 2020-11-12 LAB — BASIC METABOLIC PANEL
BUN: 14 (ref 4–21)
CO2: 22 (ref 13–22)
Chloride: 105 (ref 99–108)
Creatinine: 0.8 (ref 0.6–1.3)
Glucose: 159
Potassium: 3.7 (ref 3.4–5.3)
Sodium: 138 (ref 137–147)

## 2020-11-12 LAB — CBC: RBC: 4.45 (ref 3.87–5.11)

## 2020-11-12 LAB — COMPREHENSIVE METABOLIC PANEL
Albumin: 4.3 (ref 3.5–5.0)
Calcium: 9.1 (ref 8.7–10.7)

## 2020-11-12 NOTE — Telephone Encounter (Signed)
Per 8/31 LOS next appt scheduled and given to patient 

## 2020-11-13 LAB — PROSTATE-SPECIFIC AG, SERUM (LABCORP): Prostate Specific Ag, Serum: 48.3 ng/mL — ABNORMAL HIGH (ref 0.0–4.0)

## 2020-11-14 ENCOUNTER — Inpatient Hospital Stay: Payer: No Typology Code available for payment source | Attending: Hematology and Oncology

## 2020-11-14 ENCOUNTER — Other Ambulatory Visit: Payer: Self-pay

## 2020-11-14 ENCOUNTER — Encounter: Payer: Self-pay | Admitting: Oncology

## 2020-11-14 VITALS — BP 122/63 | HR 54 | Temp 98.1°F | Resp 18 | Ht 65.0 in | Wt 185.4 lb

## 2020-11-14 DIAGNOSIS — Z79818 Long term (current) use of other agents affecting estrogen receptors and estrogen levels: Secondary | ICD-10-CM | POA: Insufficient documentation

## 2020-11-14 DIAGNOSIS — Z79899 Other long term (current) drug therapy: Secondary | ICD-10-CM | POA: Diagnosis not present

## 2020-11-14 DIAGNOSIS — C61 Malignant neoplasm of prostate: Secondary | ICD-10-CM | POA: Insufficient documentation

## 2020-11-14 MED ORDER — SODIUM CHLORIDE 0.9 % IV SOLN: Freq: Once | INTRAVENOUS | Status: AC

## 2020-11-14 MED ORDER — HEPARIN SOD (PORK) LOCK FLUSH 100 UNIT/ML IV SOLN
500.0000 [IU] | Freq: Once | INTRAVENOUS | Status: AC | PRN
  Administered 2020-11-14: 500 [IU]

## 2020-11-14 MED ORDER — LEUPROLIDE ACETATE (3 MONTH) 22.5 MG IM KIT
22.5000 mg | PACK | Freq: Once | INTRAMUSCULAR | Status: AC
  Administered 2020-11-14: 22.5 mg via INTRAMUSCULAR
  Filled 2020-11-14: qty 22.5

## 2020-11-14 MED ORDER — SODIUM CHLORIDE 0.9% FLUSH
10.0000 mL | Freq: Once | INTRAVENOUS | Status: AC | PRN
  Administered 2020-11-14: 10 mL

## 2020-11-14 MED ORDER — ZOLEDRONIC ACID 4 MG/100ML IV SOLN
4.0000 mg | Freq: Once | INTRAVENOUS | Status: AC
  Administered 2020-11-14: 4 mg via INTRAVENOUS
  Filled 2020-11-14: qty 100

## 2020-11-14 NOTE — Patient Instructions (Signed)
Leuprolide injection What is this medication? LEUPROLIDE (loo PROE lide) is a man-made hormone. It is used to treat the symptoms of prostate cancer. This medicine may also be used to treat children with early onset of puberty. It may be used for other hormonal conditions. This medicine may be used for other purposes; ask your health care provider or pharmacist if you have questions. COMMON BRAND NAME(S): Lupron What should I tell my care team before I take this medication? They need to know if you have any of these conditions: diabetes heart disease or previous heart attack high blood pressure high cholesterol pain or difficulty passing urine spinal cord metastasis stroke tobacco smoker an unusual or allergic reaction to leuprolide, benzyl alcohol, other medicines, foods, dyes, or preservatives pregnant or trying to get pregnant breast-feeding How should I use this medication? This medicine is for injection under the skin or into a muscle. You will be taught how to prepare and give this medicine. Use exactly as directed. Take your medicine at regular intervals. Do not take your medicine more often than directed. It is important that you put your used needles and syringes in a special sharps container. Do not put them in a trash can. If you do not have a sharps container, call your pharmacist or healthcare provider to get one. A special MedGuide will be given to you by the pharmacist with each prescription and refill. Be sure to read this information carefully each time. Talk to your pediatrician regarding the use of this medicine in children. While this medicine may be prescribed for children as young as 8 years for selected conditions, precautions do apply. Overdosage: If you think you have taken too much of this medicine contact a poison control center or emergency room at once. NOTE: This medicine is only for you. Do not share this medicine with others. What if I miss a dose? If you miss  a dose, take it as soon as you can. If it is almost time for your next dose, take only that dose. Do not take double or extra doses. What may interact with this medication? Do not take this medicine with any of the following medications: chasteberry cisapride dronedarone pimozide thioridazine This medicine may also interact with the following medications: herbal or dietary supplements, like black cohosh or DHEA male hormones, like estrogens or progestins and birth control pills, patches, rings, or injections male hormones, like testosterone other medicines that prolong the QT interval (abnormal heart rhythm) This list may not describe all possible interactions. Give your health care provider a list of all the medicines, herbs, non-prescription drugs, or dietary supplements you use. Also tell them if you smoke, drink alcohol, or use illegal drugs. Some items may interact with your medicine. What should I watch for while using this medication? Visit your doctor or health care professional for regular checks on your progress. During the first week, your symptoms may get worse, but then will improve as you continue your treatment. You may get hot flashes, increased bone pain, increased difficulty passing urine, or an aggravation of nerve symptoms. Discuss these effects with your doctor or health care professional, some of them may improve with continued use of this medicine. Male patients may experience a menstrual cycle or spotting during the first 2 months of therapy with this medicine. If this continues, contact your doctor or health care professional. This medicine may increase blood sugar. Ask your healthcare provider if changes in diet or medicines are needed if you have  diabetes. What side effects may I notice from receiving this medication? Side effects that you should report to your doctor or health care professional as soon as possible: allergic reactions like skin rash, itching or  hives, swelling of the face, lips, or tongue breathing problems chest pain depression or memory disorders pain in your legs or groin pain at site where injected severe headache signs and symptoms of high blood sugar such as being more thirsty or hungry or having to urinate more than normal. You may also feel very tired or have blurry vision swelling of the feet and legs visual changes vomiting Side effects that usually do not require medical attention (report to your doctor or health care professional if they continue or are bothersome): breast swelling or tenderness decrease in sex drive or performance diarrhea hot flashes loss of appetite muscle, joint, or bone pains nausea redness or irritation at site where injected skin problems or acne This list may not describe all possible side effects. Call your doctor for medical advice about side effects. You may report side effects to FDA at 1-800-FDA-1088. Where should I keep my medication? Keep out of the reach of children. Store below 25 degrees C (77 degrees F). Do not freeze. Protect from light. Do not use if it is not clear or if there are particles present. Throw away any unused medicine after the expiration date. NOTE: This sheet is a summary. It may not cover all possible information. If you have questions about this medicine, talk to your doctor, pharmacist, or health care provider.  2022 [employer] Standard (2019-01-31 10:57:41) Zoledronic Acid Injection (Hypercalcemia, Oncology) What is this medication? ZOLEDRONIC ACID (ZOE le dron ik AS id) slows calcium loss from bones. It high calcium levels in the blood from some kinds of cancer. It may be used in other people at risk for bone loss. This medicine may be used for other purposes; ask your health care provider or pharmacist if you have questions. COMMON BRAND NAME(S): Zometa What should I tell my care team before I take this medication? They need to know if you have any  of these conditions: cancer dehydration dental disease kidney disease liver disease low levels of calcium in the blood lung or breathing disease (asthma) receiving steroids like dexamethasone or prednisone an unusual or allergic reaction to zoledronic acid, other medicines, foods, dyes, or preservatives pregnant or trying to get pregnant breast-feeding How should I use this medication? This drug is injected into a vein. It is given by a health care provider in a hospital or clinic setting. Talk to your health care provider about the use of this drug in children. Special care may be needed. Overdosage: If you think you have taken too much of this medicine contact a poison control center or emergency room at once. NOTE: This medicine is only for you. Do not share this medicine with others. What if I miss a dose? Keep appointments for follow-up doses. It is important not to miss your dose. Call your health care provider if you are unable to keep an appointment. What may interact with this medication? certain antibiotics given by injection NSAIDs, medicines for pain and inflammation, like ibuprofen or naproxen some diuretics like bumetanide, furosemide teriparatide thalidomide This list may not describe all possible interactions. Give your health care provider a list of all the medicines, herbs, non-prescription drugs, or dietary supplements you use. Also tell them if you smoke, drink alcohol, or use illegal drugs. Some items may interact with your  medicine. What should I watch for while using this medication? Visit your health care provider for regular checks on your progress. It may be some time before you see the benefit from this drug. Some people who take this drug have severe bone, joint, or muscle pain. This drug may also increase your risk for jaw problems or a broken thigh bone. Tell your health care provider right away if you have severe pain in your jaw, bones, joints, or muscles.  Tell you health care provider if you have any pain that does not go away or that gets worse. Tell your dentist and dental surgeon that you are taking this drug. You should not have major dental surgery while on this drug. See your dentist to have a dental exam and fix any dental problems before starting this drug. Take good care of your teeth while on this drug. Make sure you see your dentist for regular follow-up appointments. You should make sure you get enough calcium and vitamin D while you are taking this drug. Discuss the foods you eat and the vitamins you take with your health care provider. Check with your health care provider if you have severe diarrhea, nausea, and vomiting, or if you sweat a lot. The loss of too much body fluid may make it dangerous for you to take this drug. You may need blood work done while you are taking this drug. Do not become pregnant while taking this drug. Women should inform their health care provider if they wish to become pregnant or think they might be pregnant. There is potential for serious harm to an unborn child. Talk to your health care provider for more information. What side effects may I notice from receiving this medication? Side effects that you should report to your doctor or health care provider as soon as possible: allergic reactions (skin rash, itching or hives; swelling of the face, lips, or tongue) bone pain infection (fever, chills, cough, sore throat, pain or trouble passing urine) jaw pain, especially after dental work joint pain kidney injury (trouble passing urine or change in the amount of urine) low blood pressure (dizziness; feeling faint or lightheaded, falls; unusually weak or tired) low calcium levels (fast heartbeat; muscle cramps or pain; pain, tingling, or numbness in the hands or feet; seizures) low magnesium levels (fast, irregular heartbeat; muscle cramp or pain; muscle weakness; tremors; seizures) low red blood cell counts  (trouble breathing; feeling faint; lightheaded, falls; unusually weak or tired) muscle pain redness, blistering, peeling, or loosening of the skin, including inside the mouth severe diarrhea swelling of the ankles, feet, hands trouble breathing Side effects that usually do not require medical attention (report to your doctor or health care provider if they continue or are bothersome): anxious constipation coughing depressed mood eye irritation, itching, or pain fever general ill feeling or flu-like symptoms nausea pain, redness, or irritation at site where injected trouble sleeping This list may not describe all possible side effects. Call your doctor for medical advice about side effects. You may report side effects to FDA at 1-800-FDA-1088. Where should I keep my medication? This drug is given in a hospital or clinic. It will not be stored at home. NOTE: This sheet is a summary. It may not cover all possible information. If you have questions about this medicine, talk to your doctor, pharmacist, or health care provider.  2022 [employer] Standard (2018-12-14 09:13:00)

## 2020-11-24 ENCOUNTER — Other Ambulatory Visit: Payer: Self-pay

## 2020-11-24 DIAGNOSIS — C61 Malignant neoplasm of prostate: Secondary | ICD-10-CM

## 2020-11-24 DIAGNOSIS — C7951 Secondary malignant neoplasm of bone: Secondary | ICD-10-CM

## 2020-11-24 MED ORDER — MORPHINE SULFATE ER 15 MG PO TBCR: 15.0000 mg | EXTENDED_RELEASE_TABLET | Freq: Two times a day (BID) | ORAL | 0 refills | Status: DC

## 2020-11-25 ENCOUNTER — Encounter: Payer: Self-pay | Admitting: Oncology

## 2020-11-25 MED ORDER — ENZALUTAMIDE 40 MG PO TABS: 160.0000 mg | ORAL_TABLET | Freq: Every day | ORAL | 0 refills | Status: DC

## 2020-11-28 ENCOUNTER — Telehealth: Payer: Self-pay

## 2020-11-28 ENCOUNTER — Other Ambulatory Visit: Payer: Self-pay | Admitting: Oncology

## 2020-11-28 DIAGNOSIS — C61 Malignant neoplasm of prostate: Secondary | ICD-10-CM

## 2020-11-28 NOTE — Telephone Encounter (Signed)
-----   Message from [name], MD sent at 11/28/2020  8:58 AM EDT ----- Regarding: appts Pls tell him I would like to repeat the PSA this month.  I will order, & then needs appt a few days later

## 2020-11-28 NOTE — Telephone Encounter (Signed)
Patient notified

## 2020-12-02 NOTE — Progress Notes (Signed)
[facility]  [address] [place],  [place]  [address] [phone]  Clinic Day:  12/09/20  Referring physician: Earline Mayotte, MD  This document serves as a record of services personally performed by [name], MD. It was created on their behalf by Curry,Lauren E, a trained medical scribe. The creation of this record is based on the scribe's personal observations and the provider's statements to them.  CHIEF COMPLAINT:  CC: Metastatic prostate cancer to bone  Current Treatment:   Enzalutamide 160 mg daily, zoledronic acid and leuprolide every 3 months   HISTORY OF PRESENT ILLNESS:  Marvin Johnson is a 70 y.o. male with a history of stage IV (T3b N1 M0) prostate cancer diagnosed in January 2016 when he was found to have a PSA of 51.8.  He was treated with a laparoscopic prostatectomy in May 2016.  Pathology revealed adenocarcinoma with a Gleason 8.  There was a positive posterior margin with extraprostatic extension, as well as left retro-urethral tissue positive and extension to the seminal vesicles bilaterally with multiple positive margins.  Two lymph nodes from the right side were negative, but 2/4 lymph nodes from the left side were positive for metastasis. Repeat bone scan in June 2016 revealed a new lesion at T10, which was not present on his baseline bone scan.  PSA at Dr. [name] office in early August 2016 remained elevated at 56.4.   We began seeing him in August 2016. The PSA was up to 216.3 in late August 2016, so hormonal therapy was recommended  X-rays of the thoracic spine revealed osteoarthritis and scoliosis, as well as evidence of demineralization, but we could not visualize a metastatic lesion.[name] He started leuprolide in September 2016.  The PSA initially dropped steadily with leuprolide.  Bone density scan in November 2016 revealed osteopenia, with a T-score of -2.1 in the spine and a T-score of -0.9 in the femur.  He had  been taking vitamin-D 1000 international units daily, but not calcium, so calcium 600 mg twice daily was added at that time.   Leuprolide was held in February 2017 because of chest pain.  EKG was unremarkable.  He went to the [place] and he was referred to the [facility] for placement of 2 coronary artery stents for 95% and 99% occlusions.  He then had a third coronary artery stent placed later in the summer.  Leuprolide was resumed in March 2017.  In September 2017, he had an increase in the PSA, so we repeated a bone scan.  This revealed intense uptake within the right posterior elements of T10, but no other areas of metastasis.  We then added bicalutamide 50 mg daily to his leuprolide.     He had a steadily increasing PSA despite the bicalutamide, so this was discontinued in March 2018, in hopes that he would respond to withdrawal of the drug. He developed new mild to moderate pain in the right mid back.  He eventually was placed on MS Contin 15 mg twice daily, with oxycodone 10 mg every 4 hours as needed for breakthrough pain.  MRI thoracic spine in April 2018 revealed increasing tumor at T10 with epidural spread.  Bone scan at that time revealed increased activity in the T10 lesion, which was stable.  There was a questionable tiny focus in the right ischium/acetabular rim.  Plain x-rays of the bilateral hips and pelvis did not reveal any focal abnormality. The PSA increased from 27.5 in March, to 72.2 in  May, then to 92.2 in June 2018.  He was referred for radiotherapy to the T10 lesion due to the severe pain with improvement in his pain.  He was started on zoledronic acid along with his leuprolide in June 2018.  He had a Port-A-Cath placed in anticipation of chemotherapy, but then we recommended that he be placed on enzalutamide 160 mg daily instead, in order to delay the time to initiation of chemotherapy.  He started enzalutamide in June 2018.  The PSA became undetectable in October 2018.  We continued MS  Contin, but discontinued the oxycodone. He did not tolerate meloxicam, so uses Tylenol as needed for breakthrough pain.    He had a stress test in January 2020 and was referred to [facility] for coronary artery stenting, which was done in February.  Zoledronic acid had been giving monthly for over 2 years,so was changed to every 3 months in October 2020.   The PSA started to increase in January 2021 at which time it was 0.2.  CT chest, abdomen and pelvis in March 2021 not reveal any lymphadenopathy or soft tissue metastatic disease.  Hepatic steatosis was seen.  There was a stable sclerotic osseous lesion of the T10 vertebral body, as well as a subtle sclerotic lesion of the L3 vertebral body, also possibly a small metastatic lesion.  Whole body bone scan did not reveal continued increased activity of T10 and right acetabulum posteriorly.  There was new uptake at the anterior right third, fourth, and fifth ribs, the adjacent nature of which suggests a traumatic etiology.  No additional scintigraphic abnormalities.  He reported previous injury of his upper right chest with occasional pain of this area.  The PSA was 0.5 in March.  PSA had increased to 3.0 in June.  [facility] PET imaging from July did not reveal any evidence of active prostate carcinoma, local recurrence, or metastatic disease. The PSA has continued to slowly rise and was up to 5.9 in September, 10.9 in October, and 16.3 in December.  He had a mammogram in December to evaluate nipple tenderness, which revealed bilateral gynecomastia without any evidence of malignancy.  The PSA went up to 23.81 in March 2022, so an [facility] PET scan was ordered which revealed an oligometastasis skeletal lesion it in the anterior right 7th rib with very subtle sclerotic change at this level.  There is no evidence of local recurrence in the prostatectomy bed in no evidence of metastatic lymphadenopathy or visceral metastasis. PSA from June improved from  23.81 to 18.6.  INTERVAL HISTORY:  Marvin Johnson is here for follow up.  PSA increased from 18.6 in June to 48.3 in August. Most recent PSA from September 22nd is 53.54. We have brought him in to discuss a change in therapy. He states that he has been well. He denies bone pain, but does report pain of the legs at night as he walks 3 miles a day. He denies dysuria or hematuria. Hemoglobin is stable at 13.3, and white count and platelets are normal. Chemistries are unremarkable. His  appetite is good, and he has gained 2 and 1/2 pounds since his last visit.  He denies fever, chills or other signs of infection.  He denies nausea, vomiting, bowel issues, or abdominal pain.  He denies sore throat, cough, dyspnea, or chest pain.  REVIEW OF SYSTEMS:  Review of Systems  Constitutional: Negative.  Negative for appetite change, chills, fatigue, fever and unexpected weight change.  HENT:  Negative.  Eyes: Negative.   Respiratory: Negative.  Negative for chest tightness, cough, hemoptysis, shortness of breath and wheezing.   Cardiovascular: Negative.  Negative for chest pain, leg swelling and palpitations.  Gastrointestinal: Negative.  Negative for abdominal distention, abdominal pain, blood in stool, constipation, diarrhea, nausea and vomiting.  Endocrine: Negative.   Genitourinary: Negative.  Negative for difficulty urinating, dysuria, frequency and hematuria.   Musculoskeletal:  Negative for arthralgias, back pain, flank pain, gait problem and myalgias.       Leg pain at night due to activity   Skin: Negative.   Neurological: Negative.  Negative for dizziness, extremity weakness, gait problem, headaches, light-headedness, numbness, seizures and speech difficulty.  Hematological: Negative.   Psychiatric/Behavioral: Negative.  Negative for depression and sleep disturbance. The patient is not nervous/anxious.   All other systems reviewed and are negative.  VITALS:  Blood pressure 139/70, pulse (!) 54,  temperature 98.1 F (36.7 C), temperature source Oral, resp. rate 18, height 5\' 5"  (1.651 m), weight 187 lb 11.2 oz (85.1 kg), SpO2 95 %.  Wt Readings from Last 3 Encounters:  12/09/20 187 lb 11.2 oz (85.1 kg)  11/14/20 185 lb 7 oz (84.1 kg)  11/12/20 184 lb 11.2 oz (83.8 kg)    Body mass index is 31.23 kg/m.  Performance status (ECOG): 0 - Asymptomatic  PHYSICAL EXAM:  Physical Exam Constitutional:      General: He is not in acute distress.    Appearance: Normal appearance. He is normal weight.  HENT:     Head: Normocephalic and atraumatic.  Eyes:     General: No scleral icterus.    Extraocular Movements: Extraocular movements intact.     Conjunctiva/sclera: Conjunctivae normal.     Pupils: Pupils are equal, round, and reactive to light.  Cardiovascular:     Rate and Rhythm: Regular rhythm. Bradycardia present.     Pulses: Normal pulses.     Heart sounds: Normal heart sounds. No murmur heard.   No friction rub. No gallop.  Pulmonary:     Effort: Pulmonary effort is normal. No respiratory distress.     Breath sounds: Normal breath sounds.  Abdominal:     General: Bowel sounds are normal. There is no distension.     Palpations: Abdomen is soft. There is no hepatomegaly, splenomegaly or mass.     Tenderness: There is no abdominal tenderness.  Musculoskeletal:        General: Normal range of motion.     Cervical back: Normal range of motion and neck supple.     Right lower leg: No edema.     Left lower leg: No edema.  Lymphadenopathy:     Cervical: No cervical adenopathy.  Skin:    General: Skin is warm and dry.  Neurological:     General: No focal deficit present.     Mental Status: He is alert and oriented to person, place, and time. Mental status is at baseline.  Psychiatric:        Mood and Affect: Mood normal.        Behavior: Behavior normal.        Thought Content: Thought content normal.        Judgment: Judgment normal.   LABS:   CBC Latest Ref Rng &  Units 12/09/2020 11/12/2020 08/20/2020  WBC - 7.2 7.2 7.4  Hemoglobin 13.5 - 17.5 13.3(A) 13.4(A) 13.1(A)  Hematocrit 41 - 53 41 40(A) 39(A)  Platelets 150 - 399 231 213 226   CMP Latest Ref  Rng & Units 12/09/2020 11/12/2020 08/20/2020  BUN 4 - 21 25(A) 14 15  Creatinine 0.6 - 1.3 17.0(A) 0.8 0.8  Sodium 137 - 147 138 138 137  Potassium 3.4 - 5.3 4.3 3.7 4.2  Chloride 99 - 108 104 105 105  CO2 13 - 22 - 22 23(A)  Calcium 8.7 - 10.7 9.3 9.1 8.9  Alkaline Phos 25 - 125 49 55 55  AST 14 - 40 34 31 31  ALT 10 - 40 15 16 18       Lab Results  Component Value Date   PSA1 48.3 (H) 11/12/2020     STUDIES:  No results found.    HISTORY:   Allergies:  Allergies  Allergen Reactions  . Atorvastatin   . Rosuvastatin     Other reaction(s): Muscle pain  . Simvastatin     Other reaction(s): Joint pain, Muscle pain  . Sulfa Antibiotics   . Sulfamethoxazole-Trimethoprim Other (See Comments)    Unknown as to interaction was a baby.    Current Medications: Current Outpatient Medications  Medication Sig Dispense Refill  . aspirin 81 MG EC tablet Take 81 mg by mouth daily.    . Calcium Carb-Cholecalciferol 600-400 MG-UNIT TABS Take 1 tablet by mouth 2 (two) times daily.    . Cholecalciferol 25 MCG (1000 UT) tablet Take by mouth.    . clopidogrel (PLAVIX) 75 MG tablet Take 75 mg by mouth daily.    . diphenhydrAMINE (SOMINEX) 25 MG tablet Take 50 mg by mouth 2 (two) times daily.    . enzalutamide (XTANDI) 40 MG tablet Take 4 tablets (160 mg total) by mouth daily. 120 tablet 0  . ezetimibe (ZETIA) 10 MG tablet Take by mouth.    . folic acid-vitamin b complex-vitamin c-selenium-zinc (DIALYVITE) 3 MG TABS tablet Take 1 tablet by mouth daily.    . isosorbide mononitrate (IMDUR) 30 MG 24 hr tablet Take 1 tablet by mouth every 8 (eight) hours as needed.    [name] leuprolide (LUPRON) 22.5 MG injection Inject 22.5 mg into the muscle every 3 (three) months.    . metoprolol tartrate (LOPRESSOR) 25 MG  tablet Take 25 mg by mouth daily.    [name] morphine (MS CONTIN) 15 MG 12 hr tablet Take 1 tablet (15 mg total) by mouth every 12 (twelve) hours. 60 tablet 0  . Multiple Vitamins-Minerals (MULTIVITAMIN WITH MINERALS) tablet Take 1 tablet by mouth daily.    . Omega-3 Fatty Acids (FISH OIL) 1000 MG CAPS Take by mouth.    . rosuvastatin (CRESTOR) 10 MG tablet Take 10 mg by mouth daily.    . Royal Jelly-Bee Pollen-Ginseng (KOREAN GINSENG COMPLEX) 150-250-50 MG CAPS Take by mouth.    . senna (SENOKOT) 8.6 MG TABS tablet Take 1 tablet by mouth.    . Zoledronic Acid (ZOMETA) 4 MG/100ML IVPB Inject 4 mg into the vein.     No current facility-administered medications for this visit.     ASSESSMENT & PLAN:   Assessment:  1. 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 He continues enzalutamide 160 mg daily, as well as leuprolide 22.5 mg zoledronic acid every 3 months.  His pain is controlled with MS Contin 15 mg twice daily with occasional Tylenol for breakthrough.   He has been on enzalutamide since June 2018, so over 4 years.    2. Steadily rising PSA.  AXUMIN PET in April 2022 revealed an oligometastasis skeletal lesion it in the anterior right 7th rib with very  subtle sclerotic change at this level.  There is no evidence of local recurrence in the prostatectomy bed in no evidence of metastatic lymphadenopathy or visceral metastasis.  PSA from August increased from 18.6 to 46.3, and most recent in September measures 53.5. In view of this we will order repeat prostate specific PET scan.   3. Bilateral gynecomastia, likely due to medication.  Bilateral mammogram in December 2021 was negative for malignancy.  4. Mild normochromic, normocytic anemia likely due to chronic disease, which is stable.  Plan:   In view of the significant rise in his PSA, we will obtain prostate specific PET scan.  He knows to continue enzalutamide 160 mg daily for now, but we reviewed potential treatment  alternatives today. This could consist of IV docetaxel if he has extensive disease. However, he is more likely to have minimal disease and so we can consider oral medication. We will plan to see him back to review the PET results and finalize a treatment plan.  The patient understands the plans discussed today and is in agreement with them.  He knows to contact our office if he develops concerns prior to his next appointment.   I, Rita Ohara, am acting as scribe for [name], MD  I have reviewed this report as typed by the medical scribe, and it is complete and accurate.

## 2020-12-04 ENCOUNTER — Inpatient Hospital Stay: Payer: No Typology Code available for payment source

## 2020-12-04 DIAGNOSIS — C61 Malignant neoplasm of prostate: Secondary | ICD-10-CM | POA: Diagnosis not present

## 2020-12-04 LAB — PSA: Prostatic Specific Antigen: 53.54 ng/mL — ABNORMAL HIGH (ref 0.00–4.00)

## 2020-12-09 ENCOUNTER — Other Ambulatory Visit: Payer: Self-pay

## 2020-12-09 ENCOUNTER — Inpatient Hospital Stay (INDEPENDENT_AMBULATORY_CARE_PROVIDER_SITE_OTHER): Payer: No Typology Code available for payment source | Admitting: Oncology

## 2020-12-09 ENCOUNTER — Other Ambulatory Visit: Payer: Self-pay | Admitting: Oncology

## 2020-12-09 ENCOUNTER — Encounter: Payer: Self-pay | Admitting: Oncology

## 2020-12-09 ENCOUNTER — Ambulatory Visit: Payer: Non-veteran care

## 2020-12-09 ENCOUNTER — Inpatient Hospital Stay: Payer: No Typology Code available for payment source

## 2020-12-09 ENCOUNTER — Other Ambulatory Visit: Payer: Self-pay | Admitting: Hematology and Oncology

## 2020-12-09 VITALS — BP 139/70 | HR 54 | Temp 98.1°F | Resp 18 | Ht 65.0 in | Wt 187.7 lb

## 2020-12-09 DIAGNOSIS — C61 Malignant neoplasm of prostate: Secondary | ICD-10-CM

## 2020-12-09 DIAGNOSIS — C7952 Secondary malignant neoplasm of bone marrow: Secondary | ICD-10-CM | POA: Diagnosis not present

## 2020-12-09 DIAGNOSIS — C7951 Secondary malignant neoplasm of bone: Secondary | ICD-10-CM | POA: Diagnosis not present

## 2020-12-09 LAB — BASIC METABOLIC PANEL
BUN: 17 (ref 4–21)
Chloride: 104 (ref 99–108)
Creatinine: 0.9 (ref 0.6–1.3)
Glucose: 103
Potassium: 4.3 (ref 3.4–5.3)
Sodium: 138 (ref 137–147)

## 2020-12-09 LAB — CBC AND DIFFERENTIAL
HCT: 41 (ref 41–53)
Hemoglobin: 13.3 — AB (ref 13.5–17.5)
Neutrophils Absolute: 3.89
Platelets: 231 (ref 150–399)
WBC: 7.2

## 2020-12-09 LAB — COMPREHENSIVE METABOLIC PANEL
Albumin: 4.3 (ref 3.5–5.0)
Calcium: 9.3 (ref 8.7–10.7)

## 2020-12-09 LAB — HEPATIC FUNCTION PANEL
ALT: 15 (ref 10–40)
AST: 34 (ref 14–40)
Alkaline Phosphatase: 49 (ref 25–125)
Bilirubin, Total: 0.6

## 2020-12-09 LAB — CBC
MCV: 90 (ref 80–94)
RBC: 4.59 (ref 3.87–5.11)

## 2020-12-09 LAB — PSA: Prostatic Specific Antigen: 58.01 ng/mL — ABNORMAL HIGH (ref 0.00–4.00)

## 2020-12-10 ENCOUNTER — Other Ambulatory Visit: Payer: Self-pay | Admitting: Oncology

## 2020-12-10 DIAGNOSIS — C61 Malignant neoplasm of prostate: Secondary | ICD-10-CM

## 2020-12-11 ENCOUNTER — Encounter: Payer: Self-pay | Admitting: Oncology

## 2020-12-22 ENCOUNTER — Other Ambulatory Visit: Payer: Self-pay

## 2020-12-22 DIAGNOSIS — C7951 Secondary malignant neoplasm of bone: Secondary | ICD-10-CM

## 2020-12-22 DIAGNOSIS — C61 Malignant neoplasm of prostate: Secondary | ICD-10-CM

## 2020-12-22 MED ORDER — MORPHINE SULFATE ER 15 MG PO TBCR
15.0000 mg | EXTENDED_RELEASE_TABLET | Freq: Two times a day (BID) | ORAL | 0 refills | Status: DC
Start: 2020-12-22 — End: 2021-01-21

## 2020-12-24 ENCOUNTER — Other Ambulatory Visit: Payer: Self-pay

## 2020-12-24 ENCOUNTER — Ambulatory Visit (HOSPITAL_COMMUNITY)
Admission: RE | Admit: 2020-12-24 | Discharge: 2020-12-24 | Disposition: A | Discharge status: Home / Self Care | Payer: No Typology Code available for payment source | Source: Ambulatory Visit | Attending: Oncology | Admitting: Oncology

## 2020-12-24 DIAGNOSIS — C61 Malignant neoplasm of prostate: Secondary | ICD-10-CM | POA: Insufficient documentation

## 2020-12-24 MED ORDER — PIFLIFOLASTAT F 18 (PYLARIFY) INJECTION
9.0000 | Freq: Once | INTRAVENOUS | Status: AC
  Administered 2020-12-24: 9.6 via INTRAVENOUS

## 2021-01-13 ENCOUNTER — Other Ambulatory Visit: Payer: Self-pay

## 2021-01-13 DIAGNOSIS — C61 Malignant neoplasm of prostate: Secondary | ICD-10-CM

## 2021-01-13 DIAGNOSIS — C7951 Secondary malignant neoplasm of bone: Secondary | ICD-10-CM

## 2021-01-13 MED ORDER — ENZALUTAMIDE 40 MG PO TABS: 160.0000 mg | ORAL_TABLET | Freq: Every day | ORAL | 0 refills | Status: DC

## 2021-01-16 ENCOUNTER — Telehealth: Payer: Self-pay

## 2021-01-16 NOTE — Telephone Encounter (Signed)
-----   Message from [name], MD sent at 01/15/2021  5:35 PM EDT ----- Regarding: RE: PET SCAN results Called and disc PET in detail, just one area of uptake and he is feeling well, PSA just slowly increasing.  I rec. we continue current Rx and called pt as well, he agrees ----- Message ----- From: Dairl Ponder, RN Sent: 01/14/2021  11:07 AM EDT To: [name], MD Subject: PET SCAN results                               Pt friend, Dan Europe, called for pt. Pt would like PET scan results from 3 weeks ago and what the plan is? His next appt is end of Nov.

## 2021-01-21 ENCOUNTER — Other Ambulatory Visit: Payer: Self-pay

## 2021-01-21 DIAGNOSIS — C7951 Secondary malignant neoplasm of bone: Secondary | ICD-10-CM

## 2021-01-21 DIAGNOSIS — C61 Malignant neoplasm of prostate: Secondary | ICD-10-CM

## 2021-01-21 MED ORDER — MORPHINE SULFATE ER 15 MG PO TBCR: 15.0000 mg | EXTENDED_RELEASE_TABLET | Freq: Two times a day (BID) | ORAL | 0 refills | Status: DC

## 2021-02-02 ENCOUNTER — Other Ambulatory Visit: Payer: Self-pay | Admitting: Pharmacist

## 2021-02-04 ENCOUNTER — Telehealth: Payer: Self-pay | Admitting: Hematology and Oncology

## 2021-02-04 ENCOUNTER — Inpatient Hospital Stay: Payer: No Typology Code available for payment source

## 2021-02-04 ENCOUNTER — Inpatient Hospital Stay
Payer: No Typology Code available for payment source | Attending: Hematology and Oncology | Admitting: Hematology and Oncology

## 2021-02-04 ENCOUNTER — Encounter: Payer: Self-pay | Admitting: Hematology and Oncology

## 2021-02-04 ENCOUNTER — Other Ambulatory Visit: Payer: Self-pay | Admitting: Hematology and Oncology

## 2021-02-04 DIAGNOSIS — C7951 Secondary malignant neoplasm of bone: Secondary | ICD-10-CM | POA: Insufficient documentation

## 2021-02-04 DIAGNOSIS — C61 Malignant neoplasm of prostate: Secondary | ICD-10-CM | POA: Diagnosis present

## 2021-02-04 DIAGNOSIS — R9721 Rising PSA following treatment for malignant neoplasm of prostate: Secondary | ICD-10-CM | POA: Insufficient documentation

## 2021-02-04 DIAGNOSIS — Z79899 Other long term (current) drug therapy: Secondary | ICD-10-CM | POA: Insufficient documentation

## 2021-02-04 DIAGNOSIS — Z79818 Long term (current) use of other agents affecting estrogen receptors and estrogen levels: Secondary | ICD-10-CM | POA: Insufficient documentation

## 2021-02-04 LAB — COMPREHENSIVE METABOLIC PANEL
Albumin: 4.4 (ref 3.5–5.0)
Calcium: 9.1 (ref 8.7–10.7)

## 2021-02-04 LAB — PSA: Prostatic Specific Antigen: 83.49 ng/mL — ABNORMAL HIGH (ref 0.00–4.00)

## 2021-02-04 LAB — CBC: RBC: 4.71 (ref 3.87–5.11)

## 2021-02-04 LAB — BASIC METABOLIC PANEL
BUN: 16 (ref 4–21)
CO2: 24 — AB (ref 13–22)
Chloride: 105 (ref 99–108)
Creatinine: 0.8 (ref 0.6–1.3)
Glucose: 128
Potassium: 4 (ref 3.4–5.3)
Sodium: 140 (ref 137–147)

## 2021-02-04 LAB — CBC AND DIFFERENTIAL
HCT: 42 (ref 41–53)
Hemoglobin: 13.7 (ref 13.5–17.5)
Neutrophils Absolute: 4.38
Platelets: 252 (ref 150–399)
WBC: 7.3

## 2021-02-04 LAB — HEPATIC FUNCTION PANEL
ALT: 19 (ref 10–40)
AST: 29 (ref 14–40)
Alkaline Phosphatase: 59 (ref 25–125)
Bilirubin, Total: 0.5

## 2021-02-04 NOTE — Assessment & Plan Note (Signed)
He has had significant elevation of the PSA but PSMA PET did not reveal any progressive disease. He continues enzalutamide 160 mg daily, as well as leuprolide and zoledronic acid every 3 months.  He will proceed with leuprolide and zoledronic acid on November 28.  He knows to continue enzalutamide daily.  We will plan to see him back in 3 months with a CBC, comprehensive metabolic panel and PSA prior to his next leuprolide and zoledronic acid.

## 2021-02-04 NOTE — Progress Notes (Signed)
[facility]  [address] [place],  [place]  [address] [phone]  Clinic Day:  02/04/2021  Referring physician: Earline Mayotte, MD  ASSESSMENT & PLAN:   Assessment & Plan: Malignant neoplasm of prostate [facility]) He has had significant elevation of the PSA but PSMA PET did not reveal any progressive disease. He continues enzalutamide 160 mg daily, as well as leuprolide and zoledronic acid every 3 months.  He will proceed with leuprolide and zoledronic acid on November 28.  He knows to continue enzalutamide daily.  We will plan to see him back in 3 months with a CBC, comprehensive metabolic panel and PSA prior to his next leuprolide and zoledronic acid.    The patient understands the plans discussed today and is in agreement with them.  He knows to contact our office if he develops concerns prior to his next appointment.    Marvia Pickles, PA-C  [facility] [address] [place] [address] Dept: [phone] Dept Fax: [phone]   Orders Placed This Encounter  Procedures   CBC with Differential ([place] Only)    Standing Status:   Future    Standing Expiration Date:   02/04/2022   CMP ([place] only)    Standing Status:   Future    Standing Expiration Date:   02/04/2022   Prostate-Specific AG, Serum    Standing Status:   Future    Standing Expiration Date:   02/04/2022       CHIEF COMPLAINT:  CC: Stage IV prostate cancer with bone metastasis  Current Treatment: Leuprolide and zoledronic acid every 3 months   HISTORY OF PRESENT ILLNESS:  Marvin Johnson is a 70 year old male with a history of stage IV (T3b N1 M0) prostate cancer diagnosed in January 2016 when he was found to have a PSA of 51.8.  He was treated with a laparoscopic prostatectomy in May 2016.  Pathology revealed adenocarcinoma with a Gleason 8.  There was a positive posterior margin  with extraprostatic extension, as well as left retro-urethral tissue positive and extension to the seminal vesicles bilaterally with multiple positive margins.  Two lymph nodes from the right side were negative, but 2/4 lymph nodes from the left side were positive for metastasis. Repeat bone scan in June 2016 revealed a new lesion at T10, which was not present on his baseline bone scan.  PSA at Dr. [name] office in early August 2016 remained elevated at 56.4.    We began seeing him in August 2016. The PSA was up to 216.3 in late August 2016, so hormonal therapy was recommended  X-rays of the thoracic spine revealed osteoarthritis and scoliosis, as well as evidence of demineralization, but we could not visualize a metastatic lesion.[name] He started leuprolide in September 2016.  The PSA initially dropped steadily with leuprolide.  Bone density scan in November 2016 revealed osteopenia, with a T-score of -2.1 in the spine and a T-score of -0.9 in the femur.  He had been taking vitamin-D 1000 international units daily, but not calcium, so calcium 600 mg twice daily was added at that time.    Leuprolide was held in February 2017 because of chest pain.  EKG was unremarkable.  He went to the [place] and he was referred to the [facility] for placement of 2 coronary artery stents for 95% and 99% occlusions.  He then had a third coronary artery stent placed later in the  summer.  Leuprolide was resumed in March 2017.  In September 2017, he had an increase in the PSA, so we repeated a bone scan.  This revealed intense uptake within the right posterior elements of T10, but no other areas of metastasis.  We then added bicalutamide 50 mg daily to his leuprolide.     He had a steadily increasing PSA despite the bicalutamide, so this was discontinued in March 2018, in hopes that he would respond to withdrawal of the drug. He developed new mild to moderate pain in the right mid back.  He eventually was placed on MS Contin 15 mg twice  daily, with oxycodone 10 mg every 4 hours as needed for breakthrough pain.  MRI thoracic spine in April 2018 revealed increasing tumor at T10 with epidural spread.  Bone scan at that time revealed increased activity in the T10 lesion, which was stable.  There was a questionable tiny focus in the right ischium/acetabular rim.  Plain x-rays of the bilateral hips and pelvis did not reveal any focal abnormality. The PSA increased from 27.5 in March, to 72.2 in May, then to 92.2 in June 2018.  He was referred for radiotherapy to the T10 lesion due to the severe pain with improvement in his pain.  He was started on zoledronic acid along with his leuprolide in June 2018.  He had a Port-A-Cath placed in anticipation of chemotherapy, but then we recommended that he be placed on enzalutamide 160 mg daily instead, in order to delay the time to initiation of chemotherapy.  He started enzalutamide in June 2018.  The PSA became undetectable in October 2018.  We continued MS Contin, but discontinued the oxycodone. He did not tolerate meloxicam, so uses Tylenol as needed for breakthrough pain.     He had a stress test in January 2020 and was referred to [facility] for coronary artery stenting, which was done in February.  Zoledronic acid had been giving monthly for over 2 years, so was changed to every 3 months in October 2020.   The PSA started to increase in January 2021 at which time it was 0.2.  CT chest, abdomen and pelvis in March 2021 not reveal any lymphadenopathy or soft tissue metastatic disease.  Hepatic steatosis was seen.  There was a stable sclerotic osseous lesion of the T10 vertebral body, as well as a subtle sclerotic lesion of the L3 vertebral body, also possibly a small metastatic lesion.  Whole body bone scan did not reveal continued increased activity of T10 and right acetabulum posteriorly. There was new uptake at the anterior right third, fourth, and fifth ribs, the adjacent nature of  which suggests a traumatic etiology.  No additional scintigraphic abnormalities.  He reported previous injury of his upper right chest with occasional pain of this area.  The PSA was 0.5 in March.  PSA had increased to 3.0 in June.  Axumin PET imaging from July did not reveal any evidence of active prostate carcinoma, local recurrence, or metastatic disease. The PSA continued to slowly rise and was up to 5.9 in September, 10.9 in October, and 16.3 in December.  He had a mammogram in December 2021 to evaluate nipple tenderness, which revealed bilateral gynecomastia without any evidence of malignancy.    The PSA went up to 23.81 in March 2022, so an [place] PET scan was ordered which revealed an oligometastasis skeletal lesion it in the anterior right 7th rib with very subtle sclerotic change at this level.  There was no evidence of local recurrence in the prostatectomy bed and no evidence of metastatic lymphadenopathy or visceral metastasis. PSA in June decreased to 18.6, but then in September increased to 58.  In view of the significant rise in his PSA, a prostate specific PET scan was ordered.  PSMA PET in October revealed a persistent focus of intense radiotracer activity in the anterior RIGHT seventh rib with increased in activity from comparison exam and subtle sclerotic changes on CT. Findings remain consistent with oligometastatic skeletal metastasis. Sclerotic lesions in the T10 vertebral body without radiotracer activity potentially represented prior treated prostate cancer metastasis.  There is no evidence of hypermetabolic lymphadenopathy or lesions within the liver.  We recommended he continue his current treatment with enzalutamide, leuprolide and zoledronic acid.    INTERVAL HISTORY:  Lynann Bologna is here today for repeat clinical assessment prior to leuprolide and zoledronic acid.  He states he continues enzalutamide 160 mg daily without significant difficulty.  He denies missed doses.  He denies any  urinary symptoms.  His pain is well controlled with MS Contin 15 mg every 12 hours.  He occasionally has leg pain at night and will take Tylenol for this.  He denies fevers or chills. His appetite is good. His weight has been stable.  REVIEW OF SYSTEMS:  Review of Systems  Constitutional:  Negative for appetite change, chills, fatigue, fever and unexpected weight change.  HENT:   Negative for lump/mass, mouth sores and sore throat.   Respiratory:  Negative for cough and shortness of breath.   Cardiovascular:  Negative for chest pain and leg swelling.  Gastrointestinal:  Negative for abdominal pain, constipation, diarrhea, nausea and vomiting.  Genitourinary:  Negative for difficulty urinating, dysuria, frequency and hematuria.   Musculoskeletal:  Negative for arthralgias, back pain and myalgias.  Skin:  Negative for itching, rash and wound.  Neurological:  Negative for dizziness, extremity weakness, headaches, light-headedness and numbness.  Hematological:  Negative for adenopathy.  Psychiatric/Behavioral:  Negative for depression and sleep disturbance. The patient is not nervous/anxious.     VITALS:  Blood pressure (!) 142/65, pulse 60, temperature 98.4 F (36.9 C), temperature source Oral, resp. rate 20, height 5\' 5"  (1.651 m), weight 188 lb 12.8 oz (85.6 kg), SpO2 (!) 60 %.  Wt Readings from Last 3 Encounters:  02/04/21 188 lb 12.8 oz (85.6 kg)  12/09/20 187 lb 11.2 oz (85.1 kg)  11/14/20 185 lb 7 oz (84.1 kg)    Body mass index is 31.42 kg/m.  Performance status (ECOG): 0 - Asymptomatic  PHYSICAL EXAM:  Physical Exam Vitals and nursing note reviewed.  Constitutional:      General: He is not in acute distress.    Appearance: Normal appearance. He is normal weight.  HENT:     Head: Normocephalic and atraumatic.     Mouth/Throat:     Mouth: Mucous membranes are moist.     Pharynx: Oropharynx is clear. No oropharyngeal exudate or posterior oropharyngeal erythema.  Eyes:      General: No scleral icterus.    Extraocular Movements: Extraocular movements intact.     Conjunctiva/sclera: Conjunctivae normal.     Pupils: Pupils are equal, round, and reactive to light.  Cardiovascular:     Rate and Rhythm: Normal rate and regular rhythm.     Heart sounds: Normal heart sounds. No murmur heard.   No friction rub. No gallop.  Pulmonary:     Effort: Pulmonary effort is normal.     Breath sounds:  Normal breath sounds. No wheezing, rhonchi or rales.  Abdominal:     General: Bowel sounds are normal. There is no distension.     Palpations: Abdomen is soft. There is no hepatomegaly, splenomegaly or mass.     Tenderness: There is no abdominal tenderness.  Musculoskeletal:        General: Normal range of motion.     Cervical back: Normal range of motion and neck supple. No tenderness.     Right lower leg: No edema.     Left lower leg: No edema.  Lymphadenopathy:     Cervical: No cervical adenopathy.     Upper Body:     Right upper body: No supraclavicular or axillary adenopathy.     Left upper body: No supraclavicular or axillary adenopathy.     Lower Body: No right inguinal adenopathy. No left inguinal adenopathy.  Skin:    General: Skin is warm and dry.     Coloration: Skin is not jaundiced.     Findings: No rash.  Neurological:     Mental Status: He is alert and oriented to person, place, and time.     Cranial Nerves: No cranial nerve deficit.  Psychiatric:        Mood and Affect: Mood normal.        Behavior: Behavior normal.        Thought Content: Thought content normal.    LABS:   CBC Latest Ref Rng & Units 02/04/2021 12/09/2020 11/12/2020  WBC - 7.3 7.2 7.2  Hemoglobin 13.5 - 17.5 13.7 13.3(A) 13.4(A)  Hematocrit 41 - 53 42 41 40(A)  Platelets 150 - 399 252 231 213   CMP Latest Ref Rng & Units 02/04/2021 12/09/2020 11/12/2020  BUN 4 - 21 16 17 14   Creatinine 0.6 - 1.3 0.8 0.9 0.8  Sodium 137 - 147 140 138 138  Potassium 3.4 - 5.3 4.0 4.3 3.7   Chloride 99 - 108 105 104 105  CO2 13 - 22 24(A) - 22  Calcium 8.7 - 10.7 9.1 9.3 9.1  Alkaline Phos 25 - 125 59 49 55  AST 14 - 40 29 34 31  ALT 10 - 40 19 15 16      No results found for: CEA1 / No results found for: CEA1 Lab Results  Component Value Date   PSA1 48.3 (H) 11/12/2020   No results found for: GUR427 No results found for: CWC376  No results found for: TOTALPROTELP, ALBUMINELP, A1GS, A2GS, BETS, BETA2SER, GAMS, MSPIKE, SPEI No results found for: TIBC, FERRITIN, IRONPCTSAT No results found for: LDH  STUDIES:  No results found.    HISTORY:   Past Medical History:  Diagnosis Date   Hyperlipidemia    Malignant neoplasm of prostate ([place])    Supraventricular tachycardia [facility])     Past Surgical History:  Procedure Laterality Date   PROSTATECTOMY  07/2014   TONSILLECTOMY      Family History  Problem Relation Age of Onset   Breast cancer Mother 31   Ovarian cancer Mother 51   Breast cancer Paternal Grandmother 82    Social History:  reports that he has never smoked. He has never used smokeless tobacco. He reports that he does not currently use alcohol. He reports that he does not use drugs.The patient is alone today.  Allergies:  Allergies  Allergen Reactions   Atorvastatin    Rosuvastatin     Other reaction(s): Muscle pain   Simvastatin     Other reaction(s): Joint  pain, Muscle pain   Sulfa Antibiotics    Sulfamethoxazole-Trimethoprim Other (See Comments)    Unknown as to interaction was a baby.    Current Medications: Current Outpatient Medications  Medication Sig Dispense Refill   enzalutamide (XTANDI) 40 MG capsule TAKE FOUR CAPSULES BY MOUTH DAILY     metoprolol tartrate (LOPRESSOR) 25 MG tablet TAKE ONE-HALF TABLET BY MOUTH TWICE A DAY FOR HEART     aspirin 81 MG EC tablet Take 81 mg by mouth daily.     Calcium Carb-Cholecalciferol 600-400 MG-UNIT TABS Take 1 tablet by mouth 2 (two) times daily.     Cholecalciferol 25 MCG (1000 UT) tablet  Take by mouth.     clopidogrel (PLAVIX) 75 MG tablet Take 75 mg by mouth daily.     diphenhydrAMINE (SOMINEX) 25 MG tablet Take 50 mg by mouth 2 (two) times daily.     ezetimibe (ZETIA) 10 MG tablet Take by mouth.     folic acid-vitamin b complex-vitamin c-selenium-zinc (DIALYVITE) 3 MG TABS tablet Take 1 tablet by mouth daily.     isosorbide mononitrate (IMDUR) 30 MG 24 hr tablet Take 1 tablet by mouth every 8 (eight) hours as needed.     leuprolide (LUPRON) 22.5 MG injection Inject 22.5 mg into the muscle every 3 (three) months.     morphine (MS CONTIN) 15 MG 12 hr tablet Take 1 tablet (15 mg total) by mouth every 12 (twelve) hours. 60 tablet 0   Multiple Vitamins-Minerals (MULTIVITAMIN WITH MINERALS) tablet Take 1 tablet by mouth daily.     Omega-3 Fatty Acids (FISH OIL) 1000 MG CAPS Take by mouth.     rosuvastatin (CRESTOR) 10 MG tablet Take 10 mg by mouth daily.     Royal Jelly-Bee Pollen-Ginseng (KOREAN GINSENG COMPLEX) 150-250-50 MG CAPS Take by mouth.     senna (SENOKOT) 8.6 MG TABS tablet Take 1 tablet by mouth.     Zoledronic Acid (ZOMETA) 4 MG/100ML IVPB Inject 4 mg into the vein.     No current facility-administered medications for this visit.

## 2021-02-04 NOTE — Telephone Encounter (Signed)
Per 11/23 los next appt scheduled and confirmed with patient

## 2021-02-09 ENCOUNTER — Other Ambulatory Visit: Payer: Self-pay

## 2021-02-09 ENCOUNTER — Inpatient Hospital Stay: Payer: No Typology Code available for payment source

## 2021-02-09 VITALS — BP 136/63 | HR 59 | Temp 98.2°F | Resp 18 | Ht 65.0 in | Wt 187.0 lb

## 2021-02-09 DIAGNOSIS — C61 Malignant neoplasm of prostate: Secondary | ICD-10-CM | POA: Diagnosis not present

## 2021-02-09 MED ORDER — LEUPROLIDE ACETATE (3 MONTH) 22.5 MG IM KIT
22.5000 mg | PACK | Freq: Once | INTRAMUSCULAR | Status: AC
  Administered 2021-02-09: 22.5 mg via INTRAMUSCULAR
  Filled 2021-02-09: qty 22.5

## 2021-02-09 MED ORDER — ZOLEDRONIC ACID 4 MG/100ML IV SOLN
4.0000 mg | Freq: Once | INTRAVENOUS | Status: AC
  Administered 2021-02-09: 4 mg via INTRAVENOUS
  Filled 2021-02-09: qty 100

## 2021-02-09 MED ORDER — ENZALUTAMIDE 40 MG PO CAPS: ORAL_CAPSULE | ORAL | 2 refills | Status: DC

## 2021-02-09 MED ORDER — SODIUM CHLORIDE 0.9 % IV SOLN: Freq: Once | INTRAVENOUS | Status: AC

## 2021-02-09 NOTE — Patient Instructions (Signed)
Leuprolide injection What is this medication? LEUPROLIDE (loo PROE lide) is a man-made hormone. It is used to treat the symptoms of prostate cancer. This medicine may also be used to treat children with early onset of puberty. It may be used for other hormonal conditions. This medicine may be used for other purposes; ask your health care provider or pharmacist if you have questions. COMMON BRAND NAME(S): Lupron What should I tell my care team before I take this medication? They need to know if you have any of these conditions: diabetes heart disease or previous heart attack high blood pressure high cholesterol pain or difficulty passing urine spinal cord metastasis stroke tobacco smoker an unusual or allergic reaction to leuprolide, benzyl alcohol, other medicines, foods, dyes, or preservatives pregnant or trying to get pregnant breast-feeding How should I use this medication? This medicine is for injection under the skin or into a muscle. You will be taught how to prepare and give this medicine. Use exactly as directed. Take your medicine at regular intervals. Do not take your medicine more often than directed. It is important that you put your used needles and syringes in a special sharps container. Do not put them in a trash can. If you do not have a sharps container, call your pharmacist or healthcare provider to get one. A special MedGuide will be given to you by the pharmacist with each prescription and refill. Be sure to read this information carefully each time. Talk to your pediatrician regarding the use of this medicine in children. While this medicine may be prescribed for children as young as 8 years for selected conditions, precautions do apply. Overdosage: If you think you have taken too much of this medicine contact a poison control center or emergency room at once. NOTE: This medicine is only for you. Do not share this medicine with others. What if I miss a dose? If you miss  a dose, take it as soon as you can. If it is almost time for your next dose, take only that dose. Do not take double or extra doses. What may interact with this medication? Do not take this medicine with any of the following medications: chasteberry cisapride dronedarone pimozide thioridazine This medicine may also interact with the following medications: herbal or dietary supplements, like black cohosh or DHEA male hormones, like estrogens or progestins and birth control pills, patches, rings, or injections male hormones, like testosterone other medicines that prolong the QT interval (abnormal heart rhythm) This list may not describe all possible interactions. Give your health care provider a list of all the medicines, herbs, non-prescription drugs, or dietary supplements you use. Also tell them if you smoke, drink alcohol, or use illegal drugs. Some items may interact with your medicine. What should I watch for while using this medication? Visit your doctor or health care professional for regular checks on your progress. During the first week, your symptoms may get worse, but then will improve as you continue your treatment. You may get hot flashes, increased bone pain, increased difficulty passing urine, or an aggravation of nerve symptoms. Discuss these effects with your doctor or health care professional, some of them may improve with continued use of this medicine. Male patients may experience a menstrual cycle or spotting during the first 2 months of therapy with this medicine. If this continues, contact your doctor or health care professional. This medicine may increase blood sugar. Ask your healthcare provider if changes in diet or medicines are needed if you have  diabetes. What side effects may I notice from receiving this medication? Side effects that you should report to your doctor or health care professional as soon as possible: allergic reactions like skin rash, itching or  hives, swelling of the face, lips, or tongue breathing problems chest pain depression or memory disorders pain in your legs or groin pain at site where injected severe headache signs and symptoms of high blood sugar such as being more thirsty or hungry or having to urinate more than normal. You may also feel very tired or have blurry vision swelling of the feet and legs visual changes vomiting Side effects that usually do not require medical attention (report to your doctor or health care professional if they continue or are bothersome): breast swelling or tenderness decrease in sex drive or performance diarrhea hot flashes loss of appetite muscle, joint, or bone pains nausea redness or irritation at site where injected skin problems or acne This list may not describe all possible side effects. Call your doctor for medical advice about side effects. You may report side effects to FDA at 1-800-FDA-1088. Where should I keep my medication? Keep out of the reach of children. Store below 25 degrees C (77 degrees F). Do not freeze. Protect from light. Do not use if it is not clear or if there are particles present. Throw away any unused medicine after the expiration date. NOTE: This sheet is a summary. It may not cover all possible information. If you have questions about this medicine, talk to your doctor, pharmacist, or health care provider.  2022 [employer] (2020-11-18 00:00:00) Zoledronic Acid Injection (Hypercalcemia, Oncology) What is this medication? ZOLEDRONIC ACID (ZOE le dron ik AS id) slows calcium loss from bones. It high calcium levels in the blood from some kinds of cancer. It may be used in other people at risk for bone loss. This medicine may be used for other purposes; ask your health care provider or pharmacist if you have questions. COMMON BRAND NAME(S): Zometa What should I tell my care team before I take this medication? They need to know if you have any  of these conditions: cancer dehydration dental disease kidney disease liver disease low levels of calcium in the blood lung or breathing disease (asthma) receiving steroids like dexamethasone or prednisone an unusual or allergic reaction to zoledronic acid, other medicines, foods, dyes, or preservatives pregnant or trying to get pregnant breast-feeding How should I use this medication? This drug is injected into a vein. It is given by a health care provider in a hospital or clinic setting. Talk to your health care provider about the use of this drug in children. Special care may be needed. Overdosage: If you think you have taken too much of this medicine contact a poison control center or emergency room at once. NOTE: This medicine is only for you. Do not share this medicine with others. What if I miss a dose? Keep appointments for follow-up doses. It is important not to miss your dose. Call your health care provider if you are unable to keep an appointment. What may interact with this medication? certain antibiotics given by injection NSAIDs, medicines for pain and inflammation, like ibuprofen or naproxen some diuretics like bumetanide, furosemide teriparatide thalidomide This list may not describe all possible interactions. Give your health care provider a list of all the medicines, herbs, non-prescription drugs, or dietary supplements you use. Also tell them if you smoke, drink alcohol, or use illegal drugs. Some items may interact with your  medicine. What should I watch for while using this medication? Visit your health care provider for regular checks on your progress. It may be some time before you see the benefit from this drug. Some people who take this drug have severe bone, joint, or muscle pain. This drug may also increase your risk for jaw problems or a broken thigh bone. Tell your health care provider right away if you have severe pain in your jaw, bones, joints, or muscles.  Tell you health care provider if you have any pain that does not go away or that gets worse. Tell your dentist and dental surgeon that you are taking this drug. You should not have major dental surgery while on this drug. See your dentist to have a dental exam and fix any dental problems before starting this drug. Take good care of your teeth while on this drug. Make sure you see your dentist for regular follow-up appointments. You should make sure you get enough calcium and vitamin D while you are taking this drug. Discuss the foods you eat and the vitamins you take with your health care provider. Check with your health care provider if you have severe diarrhea, nausea, and vomiting, or if you sweat a lot. The loss of too much body fluid may make it dangerous for you to take this drug. You may need blood work done while you are taking this drug. Do not become pregnant while taking this drug. Women should inform their health care provider if they wish to become pregnant or think they might be pregnant. There is potential for serious harm to an unborn child. Talk to your health care provider for more information. What side effects may I notice from receiving this medication? Side effects that you should report to your doctor or health care provider as soon as possible: allergic reactions (skin rash, itching or hives; swelling of the face, lips, or tongue) bone pain infection (fever, chills, cough, sore throat, pain or trouble passing urine) jaw pain, especially after dental work joint pain kidney injury (trouble passing urine or change in the amount of urine) low blood pressure (dizziness; feeling faint or lightheaded, falls; unusually weak or tired) low calcium levels (fast heartbeat; muscle cramps or pain; pain, tingling, or numbness in the hands or feet; seizures) low magnesium levels (fast, irregular heartbeat; muscle cramp or pain; muscle weakness; tremors; seizures) low red blood cell counts  (trouble breathing; feeling faint; lightheaded, falls; unusually weak or tired) muscle pain redness, blistering, peeling, or loosening of the skin, including inside the mouth severe diarrhea swelling of the ankles, feet, hands trouble breathing Side effects that usually do not require medical attention (report to your doctor or health care provider if they continue or are bothersome): anxious constipation coughing depressed mood eye irritation, itching, or pain fever general ill feeling or flu-like symptoms nausea pain, redness, or irritation at site where injected trouble sleeping This list may not describe all possible side effects. Call your doctor for medical advice about side effects. You may report side effects to FDA at 1-800-FDA-1088. Where should I keep my medication? This drug is given in a hospital or clinic. It will not be stored at home. NOTE: This sheet is a summary. It may not cover all possible information. If you have questions about this medicine, talk to your doctor, pharmacist, or health care provider.  2022 [employer] (2020-11-18 00:00:00)

## 2021-02-11 ENCOUNTER — Other Ambulatory Visit: Payer: Self-pay | Admitting: Hematology and Oncology

## 2021-02-24 ENCOUNTER — Other Ambulatory Visit: Payer: Self-pay

## 2021-02-24 DIAGNOSIS — C7951 Secondary malignant neoplasm of bone: Secondary | ICD-10-CM

## 2021-02-24 DIAGNOSIS — C61 Malignant neoplasm of prostate: Secondary | ICD-10-CM

## 2021-02-24 MED ORDER — MORPHINE SULFATE ER 15 MG PO TBCR: 15.0000 mg | EXTENDED_RELEASE_TABLET | Freq: Two times a day (BID) | ORAL | 0 refills | Status: DC

## 2021-03-26 ENCOUNTER — Other Ambulatory Visit: Payer: Self-pay

## 2021-03-26 DIAGNOSIS — C61 Malignant neoplasm of prostate: Secondary | ICD-10-CM

## 2021-03-26 DIAGNOSIS — C7951 Secondary malignant neoplasm of bone: Secondary | ICD-10-CM

## 2021-03-26 MED ORDER — MORPHINE SULFATE ER 15 MG PO TBCR: 15.0000 mg | EXTENDED_RELEASE_TABLET | Freq: Two times a day (BID) | ORAL | 0 refills | Status: DC

## 2021-04-23 ENCOUNTER — Other Ambulatory Visit: Payer: Self-pay

## 2021-04-23 DIAGNOSIS — C7951 Secondary malignant neoplasm of bone: Secondary | ICD-10-CM

## 2021-04-23 DIAGNOSIS — C61 Malignant neoplasm of prostate: Secondary | ICD-10-CM

## 2021-04-23 MED ORDER — MORPHINE SULFATE ER 15 MG PO TBCR: 15.0000 mg | EXTENDED_RELEASE_TABLET | Freq: Two times a day (BID) | ORAL | 0 refills | Status: DC
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
## 2021-05-05 ENCOUNTER — Encounter: Payer: Self-pay | Admitting: Oncology

## 2021-05-07 ENCOUNTER — Inpatient Hospital Stay: Payer: No Typology Code available for payment source

## 2021-05-07 ENCOUNTER — Telehealth: Payer: Self-pay | Admitting: Oncology

## 2021-05-07 ENCOUNTER — Inpatient Hospital Stay: Payer: No Typology Code available for payment source | Attending: Oncology | Admitting: Oncology

## 2021-05-07 ENCOUNTER — Other Ambulatory Visit: Payer: Self-pay

## 2021-05-07 ENCOUNTER — Encounter: Payer: Self-pay | Admitting: Oncology

## 2021-05-07 ENCOUNTER — Other Ambulatory Visit: Payer: Self-pay | Admitting: Oncology

## 2021-05-07 VITALS — BP 132/65 | HR 73 | Temp 97.8°F | Resp 17 | Ht 65.5 in | Wt 182.8 lb

## 2021-05-07 DIAGNOSIS — Z79818 Long term (current) use of other agents affecting estrogen receptors and estrogen levels: Secondary | ICD-10-CM | POA: Diagnosis not present

## 2021-05-07 DIAGNOSIS — C7952 Secondary malignant neoplasm of bone marrow: Secondary | ICD-10-CM

## 2021-05-07 DIAGNOSIS — C61 Malignant neoplasm of prostate: Secondary | ICD-10-CM

## 2021-05-07 DIAGNOSIS — C7951 Secondary malignant neoplasm of bone: Secondary | ICD-10-CM

## 2021-05-07 DIAGNOSIS — Z79899 Other long term (current) drug therapy: Secondary | ICD-10-CM | POA: Insufficient documentation

## 2021-05-07 LAB — BASIC METABOLIC PANEL
BUN: 15 (ref 4–21)
CO2: 23 — AB (ref 13–22)
Chloride: 105 (ref 99–108)
Creatinine: 0.7 (ref 0.6–1.3)
Glucose: 157
Potassium: 3.8 (ref 3.4–5.3)
Sodium: 140 (ref 137–147)

## 2021-05-07 LAB — HEPATIC FUNCTION PANEL
ALT: 19 (ref 10–40)
AST: 32 (ref 14–40)
Alkaline Phosphatase: 69 (ref 25–125)
Bilirubin, Total: 0.6

## 2021-05-07 LAB — CBC AND DIFFERENTIAL
HCT: 39 — AB (ref 41–53)
Hemoglobin: 13.4 — AB (ref 13.5–17.5)
Neutrophils Absolute: 5.23
Platelets: 228 (ref 150–399)
WBC: 8.3

## 2021-05-07 LAB — COMPREHENSIVE METABOLIC PANEL
Albumin: 4.2 (ref 3.5–5.0)
Calcium: 9.1 (ref 8.7–10.7)

## 2021-05-07 LAB — CBC: RBC: 4.4 (ref 3.87–5.11)

## 2021-05-07 MED ORDER — ENZALUTAMIDE 40 MG PO CAPS: ORAL_CAPSULE | ORAL | 2 refills | Status: DC

## 2021-05-07 NOTE — Telephone Encounter (Signed)
Per 05/07/21 los next appt scheduled and confirmed with patient °

## 2021-05-08 LAB — PROSTATE-SPECIFIC AG, SERUM (LABCORP): Prostate Specific Ag, Serum: 31.6 ng/mL — ABNORMAL HIGH (ref 0.0–4.0)

## 2021-05-12 ENCOUNTER — Inpatient Hospital Stay: Payer: No Typology Code available for payment source

## 2021-05-12 ENCOUNTER — Other Ambulatory Visit: Payer: Self-pay

## 2021-05-12 ENCOUNTER — Encounter: Payer: Self-pay | Admitting: Oncology

## 2021-05-12 VITALS — BP 140/68 | HR 63 | Temp 98.7°F | Resp 18 | Ht 65.5 in | Wt 182.2 lb

## 2021-05-12 DIAGNOSIS — C61 Malignant neoplasm of prostate: Secondary | ICD-10-CM

## 2021-05-12 MED ORDER — LEUPROLIDE ACETATE (3 MONTH) 22.5 MG IM KIT
22.5000 mg | PACK | Freq: Once | INTRAMUSCULAR | Status: AC
  Administered 2021-05-12: 22.5 mg via INTRAMUSCULAR
  Filled 2021-05-12: qty 22.5

## 2021-05-12 MED ORDER — SODIUM CHLORIDE 0.9 % IV SOLN: Freq: Once | INTRAVENOUS | Status: AC

## 2021-05-12 MED ORDER — ZOLEDRONIC ACID 4 MG/100ML IV SOLN
4.0000 mg | Freq: Once | INTRAVENOUS | Status: AC
  Administered 2021-05-12: 4 mg via INTRAVENOUS
  Filled 2021-05-12: qty 100

## 2021-05-12 NOTE — Patient Instructions (Signed)
Zoledronic Acid Injection (Hypercalcemia, Oncology) What is this medication? ZOLEDRONIC ACID (ZOE le dron ik AS id) slows calcium loss from bones. It high calcium levels in the blood from some kinds of cancer. It may be used in other people at risk for bone loss. This medicine may be used for other purposes; ask your health care provider or pharmacist if you have questions. COMMON BRAND NAME(S): Zometa What should I tell my care team before I take this medication? They need to know if you have any of these conditions: cancer dehydration dental disease kidney disease liver disease low levels of calcium in the blood lung or breathing disease (asthma) receiving steroids like dexamethasone or prednisone an unusual or allergic reaction to zoledronic acid, other medicines, foods, dyes, or preservatives pregnant or trying to get pregnant breast-feeding How should I use this medication? This drug is injected into a vein. It is given by a health care provider in a hospital or clinic setting. Talk to your health care provider about the use of this drug in children. Special care may be needed. Overdosage: If you think you have taken too much of this medicine contact a poison control center or emergency room at once. NOTE: This medicine is only for you. Do not share this medicine with others. What if I miss a dose? Keep appointments for follow-up doses. It is important not to miss your dose. Call your health care provider if you are unable to keep an appointment. What may interact with this medication? certain antibiotics given by injection NSAIDs, medicines for pain and inflammation, like ibuprofen or naproxen some diuretics like bumetanide, furosemide teriparatide thalidomide This list may not describe all possible interactions. Give your health care provider a list of all the medicines, herbs, non-prescription drugs, or dietary supplements you use. Also tell them if you smoke, drink alcohol, or  use illegal drugs. Some items may interact with your medicine. What should I watch for while using this medication? Visit your health care provider for regular checks on your progress. It may be some time before you see the benefit from this drug. Some people who take this drug have severe bone, joint, or muscle pain. This drug may also increase your risk for jaw problems or a broken thigh bone. Tell your health care provider right away if you have severe pain in your jaw, bones, joints, or muscles. Tell you health care provider if you have any pain that does not go away or that gets worse. Tell your dentist and dental surgeon that you are taking this drug. You should not have major dental surgery while on this drug. See your dentist to have a dental exam and fix any dental problems before starting this drug. Take good care of your teeth while on this drug. Make sure you see your dentist for regular follow-up appointments. You should make sure you get enough calcium and vitamin D while you are taking this drug. Discuss the foods you eat and the vitamins you take with your health care provider. Check with your health care provider if you have severe diarrhea, nausea, and vomiting, or if you sweat a lot. The loss of too much body fluid may make it dangerous for you to take this drug. You may need blood work done while you are taking this drug. Do not become pregnant while taking this drug. Women should inform their health care provider if they wish to become pregnant or think they might be pregnant. There is potential for serious  harm to an unborn child. Talk to your health care provider for more information. What side effects may I notice from receiving this medication? Side effects that you should report to your doctor or health care provider as soon as possible: allergic reactions (skin rash, itching or hives; swelling of the face, lips, or tongue) bone pain infection (fever, chills, cough, sore  throat, pain or trouble passing urine) jaw pain, especially after dental work joint pain kidney injury (trouble passing urine or change in the amount of urine) low blood pressure (dizziness; feeling faint or lightheaded, falls; unusually weak or tired) low calcium levels (fast heartbeat; muscle cramps or pain; pain, tingling, or numbness in the hands or feet; seizures) low magnesium levels (fast, irregular heartbeat; muscle cramp or pain; muscle weakness; tremors; seizures) low red blood cell counts (trouble breathing; feeling faint; lightheaded, falls; unusually weak or tired) muscle pain redness, blistering, peeling, or loosening of the skin, including inside the mouth severe diarrhea swelling of the ankles, feet, hands trouble breathing Side effects that usually do not require medical attention (report to your doctor or health care provider if they continue or are bothersome): anxious constipation coughing depressed mood eye irritation, itching, or pain fever general ill feeling or flu-like symptoms nausea pain, redness, or irritation at site where injected trouble sleeping This list may not describe all possible side effects. Call your doctor for medical advice about side effects. You may report side effects to FDA at 1-800-FDA-1088. Where should I keep my medication? This drug is given in a hospital or clinic. It will not be stored at home. NOTE: This sheet is a summary. It may not cover all possible information. If you have questions about this medicine, talk to your doctor, pharmacist, or health care provider.  2022 Elsevier/Gold Standard (2020-11-18 00:00:00) Leuprolide injection What is this medication? LEUPROLIDE (loo PROE lide) is a man-made hormone. It is used to treat the symptoms of prostate cancer. This medicine may also be used to treat children with early onset of puberty. It may be used for other hormonal conditions. This medicine may be used for other purposes; ask  your health care provider or pharmacist if you have questions. COMMON BRAND NAME(S): Lupron What should I tell my care team before I take this medication? They need to know if you have any of these conditions: diabetes heart disease or previous heart attack high blood pressure high cholesterol pain or difficulty passing urine spinal cord metastasis stroke tobacco smoker an unusual or allergic reaction to leuprolide, benzyl alcohol, other medicines, foods, dyes, or preservatives pregnant or trying to get pregnant breast-feeding How should I use this medication? This medicine is for injection under the skin or into a muscle. You will be taught how to prepare and give this medicine. Use exactly as directed. Take your medicine at regular intervals. Do not take your medicine more often than directed. It is important that you put your used needles and syringes in a special sharps container. Do not put them in a trash can. If you do not have a sharps container, call your pharmacist or healthcare provider to get one. A special MedGuide will be given to you by the pharmacist with each prescription and refill. Be sure to read this information carefully each time. Talk to your pediatrician regarding the use of this medicine in children. While this medicine may be prescribed for children as young as 8 years for selected conditions, precautions do apply. Overdosage: If you think you have taken too  much of this medicine contact a poison control center or emergency room at once. NOTE: This medicine is only for you. Do not share this medicine with others. What if I miss a dose? If you miss a dose, take it as soon as you can. If it is almost time for your next dose, take only that dose. Do not take double or extra doses. What may interact with this medication? Do not take this medicine with any of the following medications: chasteberry cisapride dronedarone pimozide thioridazine This medicine may also  interact with the following medications: herbal or dietary supplements, like black cohosh or DHEA male hormones, like estrogens or progestins and birth control pills, patches, rings, or injections male hormones, like testosterone other medicines that prolong the QT interval (abnormal heart rhythm) This list may not describe all possible interactions. Give your health care provider a list of all the medicines, herbs, non-prescription drugs, or dietary supplements you use. Also tell them if you smoke, drink alcohol, or use illegal drugs. Some items may interact with your medicine. What should I watch for while using this medication? Visit your doctor or health care professional for regular checks on your progress. During the first week, your symptoms may get worse, but then will improve as you continue your treatment. You may get hot flashes, increased bone pain, increased difficulty passing urine, or an aggravation of nerve symptoms. Discuss these effects with your doctor or health care professional, some of them may improve with continued use of this medicine. Male patients may experience a menstrual cycle or spotting during the first 2 months of therapy with this medicine. If this continues, contact your doctor or health care professional. This medicine may increase blood sugar. Ask your healthcare provider if changes in diet or medicines are needed if you have diabetes. What side effects may I notice from receiving this medication? Side effects that you should report to your doctor or health care professional as soon as possible: allergic reactions like skin rash, itching or hives, swelling of the face, lips, or tongue breathing problems chest pain depression or memory disorders pain in your legs or groin pain at site where injected severe headache signs and symptoms of high blood sugar such as being more thirsty or hungry or having to urinate more than normal. You may also feel very tired  or have blurry vision swelling of the feet and legs visual changes vomiting Side effects that usually do not require medical attention (report to your doctor or health care professional if they continue or are bothersome): breast swelling or tenderness decrease in sex drive or performance diarrhea hot flashes loss of appetite muscle, joint, or bone pains nausea redness or irritation at site where injected skin problems or acne This list may not describe all possible side effects. Call your doctor for medical advice about side effects. You may report side effects to FDA at 1-800-FDA-1088. Where should I keep my medication? Keep out of the reach of children. Store below 25 degrees C (77 degrees F). Do not freeze. Protect from light. Do not use if it is not clear or if there are particles present. Throw away any unused medicine after the expiration date. NOTE: This sheet is a summary. It may not cover all possible information. If you have questions about this medicine, talk to your doctor, pharmacist, or health care provider.  2022 Elsevier/Gold Standard (2020-11-18 00:00:00)

## 2021-05-13 ENCOUNTER — Encounter: Payer: Self-pay | Admitting: Oncology

## 2021-05-15 ENCOUNTER — Telehealth: Payer: Self-pay

## 2021-05-15 NOTE — Telephone Encounter (Signed)
-----   Message from [name], MD sent at 05/15/2021  9:14 AM EST ----- ?Regarding: call ?Tell him (or Elta [place]) that PSA much better this time at 31.6, goes up and down ? ?

## 2021-05-15 NOTE — Telephone Encounter (Signed)
Notified of PSA  ?

## 2021-05-16 ENCOUNTER — Encounter: Payer: Self-pay | Admitting: Oncology

## 2021-05-26 ENCOUNTER — Other Ambulatory Visit: Payer: Self-pay

## 2021-05-26 DIAGNOSIS — C7951 Secondary malignant neoplasm of bone: Secondary | ICD-10-CM

## 2021-05-26 DIAGNOSIS — C61 Malignant neoplasm of prostate: Secondary | ICD-10-CM

## 2021-05-26 MED ORDER — MORPHINE SULFATE ER 15 MG PO TBCR: 15.0000 mg | EXTENDED_RELEASE_TABLET | Freq: Two times a day (BID) | ORAL | 0 refills | Status: DC

## 2021-06-24 ENCOUNTER — Other Ambulatory Visit: Payer: Self-pay

## 2021-06-24 DIAGNOSIS — C61 Malignant neoplasm of prostate: Secondary | ICD-10-CM

## 2021-06-24 DIAGNOSIS — C7951 Secondary malignant neoplasm of bone: Secondary | ICD-10-CM

## 2021-06-24 MED ORDER — MORPHINE SULFATE ER 15 MG PO TBCR: 15.0000 mg | EXTENDED_RELEASE_TABLET | Freq: Two times a day (BID) | ORAL | 0 refills | Status: DC

## 2021-07-27 ENCOUNTER — Other Ambulatory Visit: Payer: Self-pay

## 2021-07-27 DIAGNOSIS — C7951 Secondary malignant neoplasm of bone: Secondary | ICD-10-CM

## 2021-07-27 DIAGNOSIS — C61 Malignant neoplasm of prostate: Secondary | ICD-10-CM

## 2021-07-27 MED ORDER — MORPHINE SULFATE ER 15 MG PO TBCR: 15.0000 mg | EXTENDED_RELEASE_TABLET | Freq: Two times a day (BID) | ORAL | 0 refills | Status: DC

## 2021-07-30 ENCOUNTER — Inpatient Hospital Stay
Payer: No Typology Code available for payment source | Attending: Hematology and Oncology | Admitting: Hematology and Oncology

## 2021-07-30 ENCOUNTER — Encounter: Payer: Self-pay | Admitting: Hematology and Oncology

## 2021-07-30 ENCOUNTER — Inpatient Hospital Stay: Payer: No Typology Code available for payment source

## 2021-07-30 DIAGNOSIS — C61 Malignant neoplasm of prostate: Secondary | ICD-10-CM | POA: Insufficient documentation

## 2021-07-30 DIAGNOSIS — Z79899 Other long term (current) drug therapy: Secondary | ICD-10-CM | POA: Insufficient documentation

## 2021-07-30 DIAGNOSIS — C7951 Secondary malignant neoplasm of bone: Secondary | ICD-10-CM | POA: Diagnosis not present

## 2021-07-30 DIAGNOSIS — Z5111 Encounter for antineoplastic chemotherapy: Secondary | ICD-10-CM | POA: Insufficient documentation

## 2021-07-30 DIAGNOSIS — C7952 Secondary malignant neoplasm of bone marrow: Secondary | ICD-10-CM

## 2021-07-30 LAB — CBC AND DIFFERENTIAL
HCT: 42 (ref 41–53)
Hemoglobin: 13.4 — AB (ref 13.5–17.5)
Neutrophils Absolute: 4.05
Platelets: 226 10*3/uL (ref 150–400)
WBC: 7.1

## 2021-07-30 LAB — BASIC METABOLIC PANEL
BUN: 18 (ref 4–21)
CO2: 26 — AB (ref 13–22)
Chloride: 101 (ref 99–108)
Creatinine: 0.9 (ref 0.6–1.3)
Glucose: 151
Potassium: 4.3 mEq/L (ref 3.5–5.1)
Sodium: 141 (ref 137–147)

## 2021-07-30 LAB — HEPATIC FUNCTION PANEL
ALT: 21 U/L (ref 10–40)
AST: 29 (ref 14–40)
Alkaline Phosphatase: 55 (ref 25–125)
Bilirubin, Total: 0.5

## 2021-07-30 LAB — PSA: Prostatic Specific Antigen: 78.11 ng/mL — ABNORMAL HIGH (ref 0.00–4.00)

## 2021-07-30 LAB — CBC: RBC: 4.73 (ref 3.87–5.11)

## 2021-07-30 LAB — COMPREHENSIVE METABOLIC PANEL
Albumin: 4.6 (ref 3.5–5.0)
Calcium: 9.4 (ref 8.7–10.7)

## 2021-07-30 MED ORDER — ENZALUTAMIDE 40 MG PO CAPS: ORAL_CAPSULE | ORAL | 2 refills | Status: DC

## 2021-07-30 NOTE — Progress Notes (Signed)
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 He continues enzalutamide 160 mg daily, as well as leuprolide 22.5 mg zoledronic acid every 3 months.  His pain is controlled with MS Contin 15 mg twice daily with occasional Tylenol for breakthrough.   He has been on enzalutamide since June 2018, so over 4 years. Steadily rising PSA, up to 83.49 in November 2022.  PSMA PET in October 2022 revealed an oligometastasis skeletal lesion it in the anterior right 7th rib, with increased activity, and very subtle sclerotic change at T10, and no progressive disease. We therefore are continuing the same treatment of Lupron and enzalutamide. PSA in February was back down to 31.6. Results are pending from today. He will proceed with therapy and return to clinic in 3 months for repeat evaluation. We will obtain imaging with that visit.   Secondary malignant neoplasm of bone and bone marrow ([place]) He will continue with zometa every 3 months.    The patient understands the plans discussed today and is in agreement with them.  He knows to contact our office if he develops concerns prior to his next appointment.     Melodye Ped, NP  [facility] [address] [place] [address] Dept: [phone] Dept Fax: [phone]   Orders Placed This Encounter  Procedures   CBC and differential    This external order was created through the Results Console.   CBC    This external order was created through the Results Console.   Basic  metabolic panel    This external order was created through the Results Console.   Comprehensive metabolic panel    This external order was created through the Results Console.   Hepatic function panel    This external order was created through the Results Console.      CHIEF COMPLAINT:  CC: A 71 year old male with history of prostate cancer here for 3 month evaluation  Current Treatment:  Leuprolide/ zoledronic acid  INTERVAL HISTORY:  Marvin Johnson is here today for repeat clinical assessment. He denies fevers or chills. He denies pain. His appetite is good. His weight has been stable.  I have reviewed the past medical history, past surgical history, social history and family history with the patient and they are unchanged from previous note.  ALLERGIES:  is allergic to atorvastatin, rosuvastatin, simvastatin, sulfa antibiotics, and sulfamethoxazole-trimethoprim.  MEDICATIONS:  Current Outpatient Medications  Medication Sig Dispense Refill   aspirin 81 MG EC tablet Take 81 mg by mouth daily.     Calcium Carb-Cholecalciferol 600-400 MG-UNIT TABS Take 1 tablet by mouth 2 (two) times daily.     Cholecalciferol 25 MCG (1000 UT) tablet Take by mouth.     clopidogrel (PLAVIX) 75 MG tablet Take 75 mg by mouth daily.     diphenhydrAMINE (SOMINEX) 25 MG tablet Take 50 mg by mouth 2 (two) times daily.     enzalutamide (XTANDI) 40 MG capsule TAKE FOUR CAPSULES BY MOUTH DAILY 120 capsule 2   ezetimibe (ZETIA) 10 MG tablet Take by mouth.     folic acid-vitamin b complex-vitamin c-selenium-zinc (  DIALYVITE) 3 MG TABS tablet Take 1 tablet by mouth daily.     isosorbide mononitrate (IMDUR) 30 MG 24 hr tablet Take 1 tablet by mouth every 8 (eight) hours as needed.     leuprolide (LUPRON) 22.5 MG injection Inject 22.5 mg into the muscle every 3 (three) months.     metoprolol tartrate (LOPRESSOR) 25 MG tablet TAKE ONE-HALF TABLET BY MOUTH TWICE A DAY FOR HEART     morphine (MS CONTIN) 15 MG 12 hr tablet  Take 1 tablet (15 mg total) by mouth every 12 (twelve) hours. 60 tablet 0   Multiple Vitamins-Minerals (MULTIVITAMIN WITH MINERALS) tablet Take 1 tablet by mouth daily.     nitroGLYCERIN (NITROSTAT) 0.4 MG SL tablet DISSOLVE ONE TABLET UNDER THE TONGUE EVERY 5 MINUTES AS NEEDED FOR CHEST PAIN (REPEAT EVERY 5 MINUTES IF NEEDED FOR A TOTAL OF 3 TABLETS IN 15 MINUTES. IF NO RELIEF, CALL 911.)     Omega-3 Fatty Acids (FISH OIL) 1000 MG CAPS Take by mouth.     rosuvastatin (CRESTOR) 10 MG tablet Take 10 mg by mouth daily.     Royal Jelly-Bee Pollen-Ginseng (KOREAN GINSENG COMPLEX) 150-250-50 MG CAPS Take by mouth.     senna (SENOKOT) 8.6 MG TABS tablet Take 1 tablet by mouth.     Zoledronic Acid (ZOMETA) 4 MG/100ML IVPB Inject 4 mg into the vein.     No current facility-administered medications for this visit.    HISTORY OF PRESENT ILLNESS:   Oncology History  Malignant neoplasm of prostate ([place])  03/30/2014 Cancer Staging   Staging form: Prostate, AJCC 8th Edition - Clinical stage from 03/30/2014: Stage IVA (cT3b, cN1, cM0, PSA: 51.8, Grade Group: 4) - Signed by [name], MD on 11/14/2020 Histopathologic type: Adenocarcinoma, NOS Stage prefix: Initial diagnosis Prostate specific antigen (PSA) range: 20 or greater Gleason primary pattern: 4 Gleason secondary pattern: 4 Gleason score: 8 Histologic grading system: 5 grade system Location of positive needle core biopsies: Beyond primary site Prognostic indicators: May 2016 Robotic prostatectomy with mult. Pos margins, extra prostatic extension, 2/6 pos nodes. Scan June 2016 positive at T10  PSA up to 216.3 by August Placed on lupron and casodex Stage used in treatment planning: Yes National guidelines used in treatment planning: Yes Type of national guideline used in treatment planning: NCCN    02/18/2020 Initial Diagnosis   Malignant neoplasm of prostate ([place])        REVIEW OF SYSTEMS:   Constitutional: Denies fevers,  chills or abnormal weight loss Eyes: Denies blurriness of vision Ears, nose, mouth, throat, and face: Denies mucositis or sore throat Respiratory: Denies cough, dyspnea or wheezes Cardiovascular: Denies palpitation, chest discomfort or lower extremity swelling Gastrointestinal:  Denies nausea, heartburn or change in bowel habits Skin: Denies abnormal skin rashes Lymphatics: Denies new lymphadenopathy or easy bruising Neurological:Denies numbness, tingling or new weaknesses Behavioral/Psych: Mood is stable, no new changes  All other systems were reviewed with the patient and are negative.   VITALS:  Blood pressure (!) 154/67, pulse (!) 54, temperature 98.7 F (37.1 C), resp. rate 16, height 5' 5.5" (1.664 m), weight 191 lb (86.6 kg), SpO2 98 %.  Wt Readings from Last 3 Encounters:  07/30/21 191 lb (86.6 kg)  05/12/21 182 lb 4 oz (82.7 kg)  05/07/21 182 lb 12.8 oz (82.9 kg)    Body mass index is 31.3 kg/m.  Performance status (ECOG): 1 - Symptomatic but completely ambulatory  PHYSICAL EXAM:   GENERAL:alert, no distress and comfortable  SKIN: skin color, texture, turgor are normal, no rashes or significant lesions EYES: normal, Conjunctiva are pink and non-injected, sclera clear OROPHARYNX:no exudate, no erythema and lips, buccal mucosa, and tongue normal  NECK: supple, thyroid normal size, non-tender, without nodularity LYMPH:  no palpable lymphadenopathy in the cervical, axillary or inguinal LUNGS: clear to auscultation and percussion with normal breathing effort HEART: regular rate & rhythm and no murmurs and no lower extremity edema ABDOMEN:abdomen soft, non-tender and normal bowel sounds Musculoskeletal:no cyanosis of digits and no clubbing  NEURO: alert & oriented x 3 with fluent speech, no focal motor/sensory deficits  LABORATORY DATA:  I have reviewed the data as listed    Component Value Date/Time   NA 141 07/30/2021 0000   K 4.3 07/30/2021 0000   CL 101 07/30/2021  0000   CO2 26 (A) 07/30/2021 0000   BUN 18 07/30/2021 0000   CREATININE 0.9 07/30/2021 0000   CALCIUM 9.4 07/30/2021 0000   ALBUMIN 4.6 07/30/2021 0000   AST 29 07/30/2021 0000   ALT 21 07/30/2021 0000   ALKPHOS 55 07/30/2021 0000    No results found for: SPEP, UPEP  Lab Results  Component Value Date   WBC 7.1 07/30/2021   NEUTROABS 4.05 07/30/2021   HGB 13.4 (A) 07/30/2021   HCT 42 07/30/2021   MCV 90 12/09/2020   PLT 226 07/30/2021      Chemistry      Component Value Date/Time   NA 141 07/30/2021 0000   K 4.3 07/30/2021 0000   CL 101 07/30/2021 0000   CO2 26 (A) 07/30/2021 0000   BUN 18 07/30/2021 0000   CREATININE 0.9 07/30/2021 0000   GLU 151 07/30/2021 0000      Component Value Date/Time   CALCIUM 9.4 07/30/2021 0000   ALKPHOS 55 07/30/2021 0000   AST 29 07/30/2021 0000   ALT 21 07/30/2021 0000       RADIOGRAPHIC STUDIES: I have personally reviewed the radiological images as listed and agreed with the findings in the report. No results found.

## 2021-07-30 NOTE — Assessment & Plan Note (Addendum)
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 He continues enzalutamide 160 mg daily, as well as leuprolide 22.5 mg zoledronic acid every 3 months. His pain is controlled with MS Contin 15 mg twice daily with occasional Tylenol for breakthrough. He has been onenzalutamidesince June 2018, so over 4 years. Steadily rising PSA, up to 83.49 in November 2022. PSMA PET in October 2022 revealed an oligometastasis skeletal lesion it in the anterior right 7th rib, with increased activity, and very subtle sclerotic change at T10, and no progressive disease. We therefore are continuing the same treatment of Lupron and enzalutamide. PSA in February was back down to 31.6. Results are pending from today. He will proceed with therapy and return to clinic in 3 months for repeat evaluation. We will obtain imaging with that visit.

## 2021-07-30 NOTE — Assessment & Plan Note (Signed)
He will continue with zometa every 3 months.

## 2021-08-03 ENCOUNTER — Telehealth: Payer: Self-pay

## 2021-08-03 NOTE — Telephone Encounter (Signed)
-----   Message from [name], MD sent at 08/02/2021  3:51 PM EDT ----- Regarding: call Tell him PSA came down a few points, good

## 2021-08-04 ENCOUNTER — Ambulatory Visit: Payer: Non-veteran care

## 2021-08-07 ENCOUNTER — Other Ambulatory Visit: Payer: Self-pay | Admitting: Pharmacist

## 2021-08-11 ENCOUNTER — Inpatient Hospital Stay: Payer: No Typology Code available for payment source

## 2021-08-11 VITALS — BP 134/62 | HR 60 | Temp 97.7°F | Resp 18 | Ht 65.5 in | Wt 191.5 lb

## 2021-08-11 DIAGNOSIS — C61 Malignant neoplasm of prostate: Secondary | ICD-10-CM

## 2021-08-11 DIAGNOSIS — Z5111 Encounter for antineoplastic chemotherapy: Secondary | ICD-10-CM | POA: Diagnosis not present

## 2021-08-11 MED ORDER — ZOLEDRONIC ACID 4 MG/100ML IV SOLN
4.0000 mg | Freq: Once | INTRAVENOUS | Status: AC
  Administered 2021-08-11: 4 mg via INTRAVENOUS
  Filled 2021-08-11: qty 100

## 2021-08-11 MED ORDER — SODIUM CHLORIDE 0.9% FLUSH
10.0000 mL | Freq: Once | INTRAVENOUS | Status: AC | PRN
  Administered 2021-08-11: 10 mL

## 2021-08-11 MED ORDER — HEPARIN SOD (PORK) LOCK FLUSH 100 UNIT/ML IV SOLN
250.0000 [IU] | Freq: Once | INTRAVENOUS | Status: AC | PRN
  Administered 2021-08-11: 250 [IU]

## 2021-08-11 MED ORDER — SODIUM CHLORIDE 0.9 % IV SOLN: Freq: Once | INTRAVENOUS | Status: AC

## 2021-08-11 MED ORDER — LEUPROLIDE ACETATE (3 MONTH) 22.5 MG IM KIT
22.5000 mg | PACK | Freq: Once | INTRAMUSCULAR | Status: AC
  Administered 2021-08-11: 22.5 mg via INTRAMUSCULAR
  Filled 2021-08-11: qty 22.5

## 2021-08-11 NOTE — Patient Instructions (Signed)
Zoledronic Acid Injection (Cancer) What is this medication? ZOLEDRONIC ACID (ZOE le dron ik AS id) treats high calcium levels in the blood caused by cancer. It may also be used with chemotherapy to treat weakened bones caused by cancer. It works by slowing down the release of calcium from bones. This lowers calcium levels in your blood. It also makes your bones stronger and less likely to break (fracture). It belongs to a group of medications called bisphosphonates. This medicine may be used for other purposes; ask your health care provider or pharmacist if you have questions. COMMON BRAND NAME(S): Zometa, Zometa Powder What should I tell my care team before I take this medication? They need to know if you have any of these conditions: Dehydration Dental disease Kidney disease Liver disease Low levels of calcium in the blood Lung or breathing disease, such as asthma Receiving steroids, such as dexamethasone or prednisone An unusual or allergic reaction to zoledronic acid, other medications, foods, dyes, or preservatives Pregnant or trying to get pregnant Breast-feeding How should I use this medication? This medication is injected into a vein. It is given by your care team in a hospital or clinic setting. Talk to your care team about the use of this medication in children. Special care may be needed. Overdosage: If you think you have taken too much of this medicine contact a poison control center or emergency room at once. NOTE: This medicine is only for you. Do not share this medicine with others. What if I miss a dose? Keep appointments for follow-up doses. It is important not to miss your dose. Call your care team if you are unable to keep an appointment. What may interact with this medication? Certain antibiotics given by injection Diuretics, such as bumetanide, furosemide NSAIDs, medications for pain and inflammation, such as ibuprofen or naproxen Teriparatide Thalidomide This list  may not describe all possible interactions. Give your health care provider a list of all the medicines, herbs, non-prescription drugs, or dietary supplements you use. Also tell them if you smoke, drink alcohol, or use illegal drugs. Some items may interact with your medicine. What should I watch for while using this medication? Visit your care team for regular checks on your progress. It may be some time before you see the benefit from this medication. Some people who take this medication have severe bone, joint, or muscle pain. This medication may also increase your risk for jaw problems or a broken thigh bone. Tell your care team right away if you have severe pain in your jaw, bones, joints, or muscles. Tell you care team if you have any pain that does not go away or that gets worse. Tell your dentist and dental surgeon that you are taking this medication. You should not have major dental surgery while on this medication. See your dentist to have a dental exam and fix any dental problems before starting this medication. Take good care of your teeth while on this medication. Make sure you see your dentist for regular follow-up appointments. You should make sure you get enough calcium and vitamin D while you are taking this medication. Discuss the foods you eat and the vitamins you take with your care team. Check with your care team if you have severe diarrhea, nausea, and vomiting, or if you sweat a lot. The loss of too much body fluid may make it dangerous for you to take this medication. You may need bloodwork while taking this medication. Talk to your care team if   you wish to become pregnant or think you might be pregnant. This medication can cause serious birth defects. What side effects may I notice from receiving this medication? Side effects that you should report to your care team as soon as possible: Allergic reactions--skin rash, itching, hives, swelling of the face, lips, tongue, or  throat Kidney injury--decrease in the amount of urine, swelling of the ankles, hands, or feet Low calcium level--muscle pain or cramps, confusion, tingling, or numbness in the hands or feet Osteonecrosis of the jaw--pain, swelling, or redness in the mouth, numbness of the jaw, poor healing after dental work, unusual discharge from the mouth, visible bones in the mouth Severe bone, joint, or muscle pain Side effects that usually do not require medical attention (report to your care team if they continue or are bothersome): Constipation Fatigue Fever Loss of appetite Nausea Stomach pain This list may not describe all possible side effects. Call your doctor for medical advice about side effects. You may report side effects to FDA at 1-800-FDA-1088. Where should I keep my medication? This medication is given in a hospital or clinic. It will not be stored at home. NOTE: This sheet is a summary. It may not cover all possible information. If you have questions about this medicine, talk to your doctor, pharmacist, or health care provider.  2023 [employer] Standard (2021-04-24 00:00:00) Leuprolide depot injection What is this medication? LEUPROLIDE (loo PROE lide) is a man-made protein that acts like a natural hormone in the body. It decreases testosterone in men and decreases estrogen in women. In men, this medicine is used to treat advanced prostate cancer. In women, some forms of this medicine may be used to treat endometriosis, uterine fibroids, or other male hormone-related problems. This medicine may be used for other purposes; ask your health care provider or pharmacist if you have questions. COMMON BRAND NAME(S): Eligard, Fensolv, Lupron Depot, Lupron Depot-Ped, Viadur What should I tell my care team before I take this medication? They need to know if you have any of these conditions: diabetes heart disease or previous heart attack high blood pressure high cholesterol mental  illness osteoporosis pain or difficulty passing urine seizures spinal cord metastasis stroke suicidal thoughts, plans, or attempt; a previous suicide attempt by you or a family member tobacco smoker unusual vaginal bleeding (women) an unusual or allergic reaction to leuprolide, benzyl alcohol, other medicines, foods, dyes, or preservatives pregnant or trying to get pregnant breast-feeding How should I use this medication? This medicine is for injection into a muscle or for injection under the skin. It is given by a health care professional in a hospital or clinic setting. The specific product will determine how it will be given to you. Make sure you understand which product you receive and how often you will receive it. Talk to your pediatrician regarding the use of this medicine in children. Special care may be needed. Overdosage: If you think you have taken too much of this medicine contact a poison control center or emergency room at once. NOTE: This medicine is only for you. Do not share this medicine with others. What if I miss a dose? It is important not to miss a dose. Call your doctor or health care professional if you are unable to keep an appointment. Depot injections: Depot injections are given either once-monthly, every 12 weeks, every 16 weeks, or every 24 weeks depending on the product you are prescribed. The product you are prescribed will be based on if you are male  or male, and your condition. Make sure you understand your product and dosing. What may interact with this medication? Do not take this medicine with any of the following medications: chasteberry cisapride dronedarone pimozide thioridazine This medicine may also interact with the following medications: herbal or dietary supplements, like black cohosh or DHEA male hormones, like estrogens or progestins and birth control pills, patches, rings, or injections male hormones, like testosterone other medicines  that prolong the QT interval (abnormal heart rhythm) This list may not describe all possible interactions. Give your health care provider a list of all the medicines, herbs, non-prescription drugs, or dietary supplements you use. Also tell them if you smoke, drink alcohol, or use illegal drugs. Some items may interact with your medicine. What should I watch for while using this medication? Visit your doctor or health care professional for regular checks on your progress. During the first weeks of treatment, your symptoms may get worse, but then will improve as you continue your treatment. You may get hot flashes, increased bone pain, increased difficulty passing urine, or an aggravation of nerve symptoms. Discuss these effects with your doctor or health care professional, some of them may improve with continued use of this medicine. Male patients may experience a menstrual cycle or spotting during the first months of therapy with this medicine. If this continues, contact your doctor or health care professional. This medicine may increase blood sugar. Ask your healthcare provider if changes in diet or medicines are needed if you have diabetes. What side effects may I notice from receiving this medication? Side effects that you should report to your doctor or health care professional as soon as possible: allergic reactions like skin rash, itching or hives, swelling of the face, lips, or tongue breathing problems chest pain depression or memory disorders pain in your legs or groin pain at site where injected or implanted seizures severe headache signs and symptoms of high blood sugar such as being more thirsty or hungry or having to urinate more than normal. You may also feel very tired or have blurry vision swelling of the feet and legs suicidal thoughts or other mood changes visual changes vomiting Side effects that usually do not require medical attention (report to your doctor or health care  professional if they continue or are bothersome): breast swelling or tenderness decrease in sex drive or performance diarrhea hot flashes loss of appetite muscle, joint, or bone pains nausea redness or irritation at site where injected or implanted skin problems or acne This list may not describe all possible side effects. Call your doctor for medical advice about side effects. You may report side effects to FDA at 1-800-FDA-1088. Where should I keep my medication? This drug is given in a hospital or clinic and will not be stored at home. NOTE: This sheet is a summary. It may not cover all possible information. If you have questions about this medicine, talk to your doctor, pharmacist, or health care provider.  2023 [employer] Standard (2021-01-30 00:00:00)

## 2021-08-12 ENCOUNTER — Encounter: Payer: Self-pay | Admitting: Oncology

## 2021-08-12 NOTE — Telephone Encounter (Signed)
Patient notified

## 2021-08-12 NOTE — Telephone Encounter (Signed)
-----   Message from [name], MD sent at 08/02/2021  3:51 PM EDT ----- Regarding: call Tell him PSA came down a few points, good

## 2021-08-28 ENCOUNTER — Other Ambulatory Visit: Payer: Self-pay | Admitting: Hematology and Oncology

## 2021-08-28 DIAGNOSIS — C61 Malignant neoplasm of prostate: Secondary | ICD-10-CM

## 2021-08-28 DIAGNOSIS — C7951 Secondary malignant neoplasm of bone: Secondary | ICD-10-CM

## 2021-08-28 MED ORDER — MORPHINE SULFATE ER 15 MG PO TBCR: 15.0000 mg | EXTENDED_RELEASE_TABLET | Freq: Two times a day (BID) | ORAL | 0 refills | Status: DC

## 2021-09-23 ENCOUNTER — Other Ambulatory Visit: Payer: Self-pay

## 2021-09-23 DIAGNOSIS — C61 Malignant neoplasm of prostate: Secondary | ICD-10-CM

## 2021-09-23 DIAGNOSIS — C7951 Secondary malignant neoplasm of bone: Secondary | ICD-10-CM

## 2021-09-23 MED ORDER — MORPHINE SULFATE ER 15 MG PO TBCR: 15.0000 mg | EXTENDED_RELEASE_TABLET | Freq: Two times a day (BID) | ORAL | 0 refills | Status: DC

## 2021-10-27 ENCOUNTER — Other Ambulatory Visit: Payer: Self-pay

## 2021-10-27 DIAGNOSIS — C7951 Secondary malignant neoplasm of bone: Secondary | ICD-10-CM

## 2021-10-27 DIAGNOSIS — C61 Malignant neoplasm of prostate: Secondary | ICD-10-CM

## 2021-10-27 MED ORDER — MORPHINE SULFATE ER 15 MG PO TBCR: 15.0000 mg | EXTENDED_RELEASE_TABLET | Freq: Two times a day (BID) | ORAL | 0 refills | Status: DC

## 2021-10-28 ENCOUNTER — Other Ambulatory Visit: Payer: Self-pay

## 2021-10-28 DIAGNOSIS — C61 Malignant neoplasm of prostate: Secondary | ICD-10-CM

## 2021-10-28 DIAGNOSIS — C7951 Secondary malignant neoplasm of bone: Secondary | ICD-10-CM

## 2021-10-28 MED ORDER — ENZALUTAMIDE 40 MG PO CAPS: ORAL_CAPSULE | ORAL | 2 refills | Status: DC

## 2021-10-28 MED ORDER — MORPHINE SULFATE ER 15 MG PO TBCR: 15.0000 mg | EXTENDED_RELEASE_TABLET | Freq: Two times a day (BID) | ORAL | 0 refills | Status: DC

## 2021-10-29 ENCOUNTER — Other Ambulatory Visit: Payer: Self-pay

## 2021-10-29 ENCOUNTER — Other Ambulatory Visit: Payer: Self-pay | Admitting: Hematology and Oncology

## 2021-10-29 DIAGNOSIS — C7951 Secondary malignant neoplasm of bone: Secondary | ICD-10-CM

## 2021-10-29 DIAGNOSIS — C61 Malignant neoplasm of prostate: Secondary | ICD-10-CM

## 2021-10-30 ENCOUNTER — Other Ambulatory Visit: Payer: Self-pay | Admitting: Hematology and Oncology

## 2021-10-30 ENCOUNTER — Encounter: Payer: Self-pay | Admitting: Hematology and Oncology

## 2021-10-30 ENCOUNTER — Inpatient Hospital Stay: Payer: No Typology Code available for payment source | Attending: Hematology and Oncology

## 2021-10-30 ENCOUNTER — Inpatient Hospital Stay (INDEPENDENT_AMBULATORY_CARE_PROVIDER_SITE_OTHER): Payer: No Typology Code available for payment source | Admitting: Hematology and Oncology

## 2021-10-30 DIAGNOSIS — C61 Malignant neoplasm of prostate: Secondary | ICD-10-CM | POA: Diagnosis not present

## 2021-10-30 DIAGNOSIS — C7951 Secondary malignant neoplasm of bone: Secondary | ICD-10-CM

## 2021-10-30 DIAGNOSIS — Z5111 Encounter for antineoplastic chemotherapy: Secondary | ICD-10-CM | POA: Insufficient documentation

## 2021-10-30 DIAGNOSIS — C7952 Secondary malignant neoplasm of bone marrow: Secondary | ICD-10-CM | POA: Diagnosis not present

## 2021-10-30 LAB — CBC AND DIFFERENTIAL
HCT: 40 — AB (ref 41–53)
Hemoglobin: 13.4 — AB (ref 13.5–17.5)
Neutrophils Absolute: 4.85
Platelets: 251 10*3/uL (ref 150–400)
WBC: 9.5

## 2021-10-30 LAB — CBC
MCV: 88 (ref 80–94)
RBC: 4.5 (ref 3.87–5.11)

## 2021-10-30 LAB — HEPATIC FUNCTION PANEL
ALT: 23 U/L (ref 10–40)
AST: 41 — AB (ref 14–40)
Alkaline Phosphatase: 57 (ref 25–125)
Bilirubin, Total: 0.7

## 2021-10-30 LAB — COMPREHENSIVE METABOLIC PANEL
Albumin: 4.4 (ref 3.5–5.0)
Calcium: 9.6 (ref 8.7–10.7)

## 2021-10-30 LAB — BASIC METABOLIC PANEL
BUN: 15 (ref 4–21)
CO2: 23 — AB (ref 13–22)
Chloride: 106 (ref 99–108)
Creatinine: 0.7 (ref 0.6–1.3)
Glucose: 104
Potassium: 4.1 mEq/L (ref 3.5–5.1)
Sodium: 137 (ref 137–147)

## 2021-10-30 NOTE — Progress Notes (Signed)
[facility]  [address] 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 He continues enzalutamide 160 mg daily, as well as leuprolide 22.5 mg zoledronic acid every 3 months.  His pain is controlled with MS Contin 15 mg twice daily with occasional Tylenol for breakthrough.   He has been on enzalutamide since June 2018.  He then had a teadily rising PSA, up to 83.49 in November 2022.  PSMA PET in October 2022 revealed an oligometastasis skeletal lesion it in the anterior right 7th rib, with increased activity, and very subtle sclerotic change at T10, but no evidence of progressive disease. We therefore have continued the same treatment.  The PSA has continued to fluctuate up and down.  It was down to 31 in February then back up to 34 in May.  As he he remains asymptomatic, and the PSA is in stable range, we will not plan routine imaging.  He no to continue enzalutamide daily.  He will proceed with leuprolide/zoledronic acid next week..  We will plan to see him back in 3 months prior to his next leuprolide/zoledronic acid.  Secondary malignant neoplasm of bone and bone marrow ([place]) Hel continues zoledronic acid every 3 months in addition to his cancer therapy.    The patient understands the plans discussed today and is in agreement with them.  He knows to contact our office if he develops concerns prior to his next appointment.   I provided 15 minutes of face-to-face time during this encounter and > 50% was spent counseling as documented under my assessment and plan.    Marvia Pickles, PA-C  [facility] AT [facility] [address] [place] [address] Dept: [phone] Dept Fax: [phone]    Orders Placed This Encounter  Procedures   CBC and differential    This external order was created through the Results Console.   CBC    This external order was created through the Results Console.   Basic metabolic panel    This external order was created through the Results Console.   Comprehensive metabolic panel    This external order was created through the Results Console.   Hepatic function panel    This external order was created through the Results Console.   CBC    This order was created through External Result Entry      CHIEF COMPLAINT:  CC: Metastatic prostate cancer to bone  Current Treatment: Leuprolide/enzalutamide/zoledronic acid  HISTORY OF PRESENT ILLNESS:  Marvin Johnson is a 71 year old male with a history of stage IV (T3b N1 M0) prostate cancer diagnosed in January 2016 when he was found to have a PSA of 51.8.  He was treated with a laparoscopic prostatectomy in May 2016.  Pathology revealed adenocarcinoma with a Gleason 8.  There was a positive posterior margin with extraprostatic extension, as well as left retro-urethral tissue positive and extension to the seminal vesicles bilaterally with multiple positive margins.  Two lymph nodes from the right side were negative, but 2/4 lymph nodes from the left side were positive for metastasis. Repeat bone scan in June 2016 revealed a new lesion at T10, which was not present on his baseline bone scan.  PSA at Dr. [name] office in early August  2016 remained elevated at 56.4.    We began seeing him in August 2016. The PSA was up to 216.3 in late August 2016, so hormonal therapy was recommended  X-rays of the thoracic spine revealed osteoarthritis and scoliosis, as well as evidence of demineralization, but we could not visualize a metastatic lesion.[name] He started leuprolide in September 2016.  The PSA initially dropped steadily with leuprolide.  Bone density scan in November 2016 revealed osteopenia, with a T-score of -2.1  in the spine and a T-score of -0.9 in the femur.  He had been taking vitamin-D 1000 international units daily, but not calcium, so calcium 600 mg twice daily was added at that time.    Leuprolide was held in February 2017 because of chest pain.  EKG was unremarkable.  He went to the [place] and he was referred to the [facility] for placement of 2 coronary artery stents for 95% and 99% occlusions.  He then had a third coronary artery stent placed later in the summer.  Leuprolide was resumed in March 2017.  In September 2017, he had an increase in the PSA, so we repeated a bone scan.  This revealed intense uptake within the right posterior elements of T10, but no other areas of metastasis.  We then added bicalutamide 50 mg daily to his leuprolide.     He had a steadily increasing PSA despite the bicalutamide, so this was discontinued in March 2018, in hopes that he would respond to withdrawal of the drug. He developed new mild to moderate pain in the right mid back.  He eventually was placed on MS Contin 15 mg twice daily, with oxycodone 10 mg every 4 hours as needed for breakthrough pain.  MRI thoracic spine in April 2018 revealed increasing tumor at T10 with epidural spread.  Bone scan at that time revealed increased activity in the T10 lesion, which was stable.  There was a questionable tiny focus in the right ischium/acetabular rim.  Plain x-rays of the bilateral hips and pelvis did not reveal any focal abnormality. The PSA increased from 27.5 in March, to 72.2 in May, then to 92.2 in June 2018.  He was referred for radiotherapy to the T10 lesion due to the severe pain with improvement in his pain.  He was started on zoledronic acid along with his leuprolide in June 2018.  He had a Port-A-Cath placed in anticipation of chemotherapy, but then we recommended that he be placed on enzalutamide 160 mg daily instead, in order to delay the time to initiation of chemotherapy.  He started enzalutamide in June 2018.  The PSA  became undetectable in October 2018.  We continued MS Contin, but discontinued the oxycodone. He did not tolerate meloxicam, so uses Tylenol as needed for breakthrough pain.     He had a stress test in January 2020 and was referred to [facility] for coronary artery stenting, which was done in February.  Zoledronic acid had been giving monthly for over 2 years, so was changed to every 3 months in October 2020.   The PSA started to increase in January 2021 at which time it was 0.2.  CT chest, abdomen and pelvis in March 2021 not reveal any lymphadenopathy or soft tissue metastatic disease.  Hepatic steatosis was seen.  There was a stable sclerotic osseous lesion of the T10 vertebral body, as well as a subtle sclerotic lesion of the L3 vertebral body, also possibly a small metastatic lesion.  Whole body bone  scan did not reveal continued increased activity of T10 and right acetabulum posteriorly. There was new uptake at the anterior right third, fourth, and fifth ribs, the adjacent nature of which suggests a traumatic etiology.  No additional scintigraphic abnormalities.  He reported previous injury of his upper right chest with occasional pain of this area.  The PSA was 0.5 in March.  PSA had increased to 3.0 in June.  [facility] PET imaging from July did not reveal any evidence of active prostate carcinoma, local recurrence, or metastatic disease. The PSA continued to slowly rise and was up to 5.9 in September, 10.9 in October, and 16.3 in December.  He had a mammogram in December 2021 to evaluate nipple tenderness, which revealed bilateral gynecomastia without any evidence of malignancy.     The PSA went up to 23.81 in March 2022, so an [place] PET scan was ordered which revealed an oligometastasis skeletal lesion it in the anterior right 7th rib with very subtle sclerotic change at this level.  There was no evidence of local recurrence in the prostatectomy bed and no evidence of metastatic  lymphadenopathy or visceral metastasis. PSA in June decreased to 18.6, but then in September increased to 58.  In view of the significant rise in his PSA, a prostate specific PET scan was ordered.  PSMA PET in October 2022 revealed a persistent focus of intense radiotracer activity in the anterior RIGHT seventh rib with increased in activity from comparison exam and subtle sclerotic changes on CT. Findings remain consistent with oligometastatic skeletal metastasis. Sclerotic lesions in the T10 vertebral body without radiotracer activity potentially represented prior treated prostate cancer metastasis.  There is no evidence of hypermetabolic lymphadenopathy or lesions within the liver.  We recommended he continue his current treatment with enzalutamide, leuprolide and zoledronic acid.  PSA in November continued to increase to 83.49.  Even though his PSA continued to steadily increase, we did not recommend a change in therapy based on this alone. PSMA PET imaging from October revealed no evidence of progressive disease.  He has a good quality of life.  The PSA has continued to fluctuate up and down and was 31 in February and 78 in May.   Oncology History  Malignant neoplasm of prostate ([place])  03/30/2014 Cancer Staging   Staging form: Prostate, AJCC 8th Edition - Clinical stage from 03/30/2014: Stage IVA (cT3b, cN1, cM0, PSA: 51.8, Grade Group: 4) - Signed by [name], MD on 11/14/2020 Histopathologic type: Adenocarcinoma, NOS Stage prefix: Initial diagnosis Prostate specific antigen (PSA) range: 20 or greater Gleason primary pattern: 4 Gleason secondary pattern: 4 Gleason score: 8 Histologic grading system: 5 grade system Location of positive needle core biopsies: Beyond primary site Prognostic indicators: May 2016 Robotic prostatectomy with mult. Pos margins, extra prostatic extension, 2/6 pos nodes. Scan June 2016 positive at T10  PSA up to 216.3 by August Placed on lupron and  casodex Stage used in treatment planning: Yes National guidelines used in treatment planning: Yes Type of national guideline used in treatment planning: NCCN   02/18/2020 Initial Diagnosis   Malignant neoplasm of prostate ([place])       INTERVAL HISTORY:  Marvin Johnson is here today for repeat clinical assessment.  He states he continues enzalutamide daily without significant difficulty.  He does note some fatigue.  He denies increasing pain.  He denies new symptoms.  He denies fevers or chills. He denies pain. His appetite is good. His weight has increased 1 pounds over last 3 months .  We initially considered routine imaging prior to this appointment, but decided against it.  REVIEW OF SYSTEMS:  Review of Systems  Constitutional:  Positive for fatigue (Mild). Negative for appetite change, chills, fever and unexpected weight change.  HENT:   Negative for lump/mass, mouth sores and sore throat.   Respiratory:  Negative for cough and shortness of breath.   Cardiovascular:  Negative for chest pain and leg swelling.  Gastrointestinal:  Negative for abdominal pain, constipation, diarrhea, nausea and vomiting.  Genitourinary:  Negative for difficulty urinating, dysuria, frequency and hematuria.   Musculoskeletal:  Negative for arthralgias, back pain and myalgias.  Skin:  Negative for itching, rash and wound.  Neurological:  Negative for dizziness, extremity weakness, headaches, light-headedness and numbness.  Hematological:  Negative for adenopathy.  Psychiatric/Behavioral:  Negative for depression and sleep disturbance. The patient is not nervous/anxious.      VITALS:  Blood pressure 124/70, pulse (!) 55, temperature 98.3 F (36.8 C), temperature source Oral, resp. rate 18, height 5' 5.5" (1.664 m), weight 192 lb 12.8 oz (87.5 kg), SpO2 94 %.  Wt Readings from Last 3 Encounters:  10/30/21 192 lb 12.8 oz (87.5 kg)  08/11/21 191 lb 8 oz (86.9 kg)  07/30/21 191 lb (86.6 kg)    Body mass index is  31.6 kg/m.  Performance status (ECOG): 1 - Symptomatic but completely ambulatory  PHYSICAL EXAM:  Physical Exam Vitals and nursing note reviewed.  Constitutional:      General: He is not in acute distress.    Appearance: Normal appearance. He is normal weight.  HENT:     Head: Normocephalic and atraumatic.     Mouth/Throat:     Mouth: Mucous membranes are moist.     Pharynx: Oropharynx is clear. No oropharyngeal exudate or posterior oropharyngeal erythema.  Eyes:     General: No scleral icterus.    Extraocular Movements: Extraocular movements intact.     Conjunctiva/sclera: Conjunctivae normal.     Pupils: Pupils are equal, round, and reactive to light.  Cardiovascular:     Rate and Rhythm: Normal rate and regular rhythm.     Heart sounds: Normal heart sounds. No murmur heard.    No friction rub. No gallop.  Pulmonary:     Effort: Pulmonary effort is normal.     Breath sounds: Normal breath sounds. No wheezing, rhonchi or rales.  Abdominal:     General: Bowel sounds are normal. There is no distension.     Palpations: Abdomen is soft. There is no hepatomegaly, splenomegaly or mass.     Tenderness: There is no abdominal tenderness.  Musculoskeletal:        General: Normal range of motion.     Cervical back: Normal range of motion and neck supple. No tenderness.     Right lower leg: No edema.     Left lower leg: No edema.  Lymphadenopathy:     Cervical: No cervical adenopathy.     Upper Body:     Right upper body: No supraclavicular or axillary adenopathy.     Left upper body: No supraclavicular or axillary adenopathy.     Lower Body: No right inguinal adenopathy. No left inguinal adenopathy.  Skin:    General: Skin is warm and dry.     Coloration: Skin is not jaundiced.     Findings: No rash.  Neurological:     Mental Status: He is alert and oriented to person, place, and time.     Cranial Nerves: No cranial  nerve deficit.  Psychiatric:        Mood and Affect: Mood  normal.        Behavior: Behavior normal.        Thought Content: Thought content normal.    LABS:      Latest Ref Rng & Units 10/30/2021   12:00 AM 07/30/2021   12:00 AM 05/07/2021   12:00 AM  CBC  WBC  9.5     7.1     8.3   Hemoglobin 13.5 - 17.5 13.4     13.4     13.4   Hematocrit 41 - 53 40     42     39   Platelets 150 - 400 K/uL 251     226     228      This result is from an external source.      Latest Ref Rng & Units 10/30/2021   12:00 AM 07/30/2021   12:00 AM 05/07/2021   12:00 AM  CMP  BUN 4 - '21 15     18     15   '$ Creatinine 0.6 - 1.3 0.7     0.9     0.7   Sodium 137 - 147 137     141     140   Potassium 3.5 - 5.1 mEq/L 4.1     4.3     3.8   Chloride 99 - 108 106     101     105   CO2 13 - '22 23     26     23   '$ Calcium 8.7 - 10.7 9.6     9.4     9.1   Alkaline Phos 25 - 125 57     55     69   AST 14 - 40 41     29     32   ALT 10 - 40 U/L '23     21     19      '$ This result is from an external source.     No results found for: "CEA1", "CEA" / No results found for: "CEA1", "CEA" Lab Results  Component Value Date   PSA1 31.6 (H) 05/07/2021   No results found for: "BWL893" No results found for: "CAN125"  No results found for: "TOTALPROTELP", "ALBUMINELP", "A1GS", "A2GS", "BETS", "BETA2SER", "GAMS", "MSPIKE", "SPEI" No results found for: "TIBC", "FERRITIN", "IRONPCTSAT" No results found for: "LDH"  STUDIES:  No results found.    HISTORY:   Past Medical History:  Diagnosis Date   Hyperlipidemia    Malignant neoplasm of prostate ([place])    Supraventricular tachycardia [facility])     Past Surgical History:  Procedure Laterality Date   PROSTATECTOMY  07/2014   TONSILLECTOMY      Family History  Problem Relation Age of Onset   Breast cancer Mother 67   Ovarian cancer Mother 35   Breast cancer Paternal Grandmother 47    Social History:  reports that he has never smoked. He has never used smokeless tobacco. He reports that he does not currently use  alcohol. He reports that he does not use drugs.The patient is alone today.  Allergies:  Allergies  Allergen Reactions   Atorvastatin    Rosuvastatin Other (See Comments)    Other reaction(s): Muscle pain   Simvastatin Other (See Comments)    Other reaction(s): Joint pain, Muscle pain   Sulfa Antibiotics    Sulfamethoxazole-Trimethoprim Other (  See Comments)    Unknown as to interaction was a baby.    Current Medications: Current Outpatient Medications  Medication Sig Dispense Refill   Alirocumab (PRALUENT [place]) Inject into the skin. One injection every 2 weeks     aspirin 81 MG EC tablet Take 81 mg by mouth daily.     Cholecalciferol 25 MCG (1000 UT) tablet Take by mouth.     clopidogrel (PLAVIX) 75 MG tablet Take 75 mg by mouth daily.     diphenhydrAMINE (SOMINEX) 25 MG tablet Take 50 mg by mouth 2 (two) times daily.     ezetimibe (ZETIA) 10 MG tablet Take by mouth.     isosorbide mononitrate (IMDUR) 30 MG 24 hr tablet Take 1 tablet by mouth every 8 (eight) hours as needed.     leuprolide (LUPRON) 22.5 MG injection Inject 22.5 mg into the muscle every 3 (three) months.     metoprolol tartrate (LOPRESSOR) 25 MG tablet TAKE ONE-HALF TABLET BY MOUTH TWICE A DAY FOR HEART     morphine (MS CONTIN) 15 MG 12 hr tablet Take 1 tablet (15 mg total) by mouth every 12 (twelve) hours. 60 tablet 0   Multiple Vitamins-Minerals (MULTIVITAMIN WITH MINERALS) tablet Take 1 tablet by mouth daily.     nitroGLYCERIN (NITROSTAT) 0.4 MG SL tablet DISSOLVE ONE TABLET UNDER THE TONGUE EVERY 5 MINUTES AS NEEDED FOR CHEST PAIN (REPEAT EVERY 5 MINUTES IF NEEDED FOR A TOTAL OF 3 TABLETS IN 15 MINUTES. IF NO RELIEF, CALL 911.)     Omega-3 Fatty Acids (FISH OIL) 1000 MG CAPS Take by mouth.     Royal Jelly-Bee Pollen-Ginseng (KOREAN GINSENG COMPLEX) 150-250-50 MG CAPS Take by mouth.     senna (SENOKOT) 8.6 MG TABS tablet Take 1 tablet by mouth.     Zoledronic Acid (ZOMETA) 4 MG/100ML IVPB Inject 4 mg into the vein.      No current facility-administered medications for this visit.

## 2021-10-30 NOTE — Assessment & Plan Note (Addendum)
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 He continues enzalutamide 160 mg daily, as well as leuprolide 22.5 mg zoledronic acid every 3 months. His pain is controlled with MS Contin 15 mg twice daily with occasional Tylenol for breakthrough. He has been onenzalutamidesince June 2018.  He then had a teadily rising PSA, up to 83.49 in November 2022. PSMA PET in October 2022 revealed an oligometastasis skeletal lesion it in the anterior right 7th rib, with increased activity, and very subtle sclerotic change at T10, but no evidence of progressive disease. We therefore have continued the same treatment.  The PSA has continued to fluctuate up and down.  It was down to 31 in February then back up to 42 in May.  As he he remains asymptomatic, and the PSA is in stable range, we will not plan routine imaging.  He no to continue enzalutamide daily.  He will proceed with leuprolide/zoledronic acid next week..  We will plan to see him back in 3 months prior to his next leuprolide/zoledronic acid.

## 2021-10-30 NOTE — Assessment & Plan Note (Signed)
Marvin Johnson continues zoledronic acid every 3 months in addition to his cancer therapy.

## 2021-10-31 LAB — PROSTATE-SPECIFIC AG, SERUM (LABCORP): Prostate Specific Ag, Serum: 334 ng/mL — ABNORMAL HIGH (ref 0.0–4.0)

## 2021-11-04 ENCOUNTER — Ambulatory Visit: Payer: Non-veteran care

## 2021-11-10 ENCOUNTER — Encounter: Payer: Self-pay | Admitting: Oncology

## 2021-11-10 ENCOUNTER — Other Ambulatory Visit: Payer: Self-pay | Admitting: Hematology and Oncology

## 2021-11-10 DIAGNOSIS — C7951 Secondary malignant neoplasm of bone: Secondary | ICD-10-CM

## 2021-11-10 DIAGNOSIS — R972 Elevated prostate specific antigen [PSA]: Secondary | ICD-10-CM

## 2021-11-10 DIAGNOSIS — R9721 Rising PSA following treatment for malignant neoplasm of prostate: Secondary | ICD-10-CM

## 2021-11-10 DIAGNOSIS — C61 Malignant neoplasm of prostate: Secondary | ICD-10-CM

## 2021-11-11 ENCOUNTER — Inpatient Hospital Stay: Payer: No Typology Code available for payment source

## 2021-11-11 VITALS — BP 135/64 | HR 62 | Temp 98.3°F | Resp 18 | Ht 65.5 in | Wt 188.1 lb

## 2021-11-11 DIAGNOSIS — Z5111 Encounter for antineoplastic chemotherapy: Secondary | ICD-10-CM | POA: Diagnosis not present

## 2021-11-11 DIAGNOSIS — C61 Malignant neoplasm of prostate: Secondary | ICD-10-CM

## 2021-11-11 MED ORDER — LEUPROLIDE ACETATE (3 MONTH) 22.5 MG IM KIT
22.5000 mg | PACK | Freq: Once | INTRAMUSCULAR | Status: AC
  Administered 2021-11-11: 22.5 mg via INTRAMUSCULAR
  Filled 2021-11-11: qty 22.5

## 2021-11-11 MED ORDER — ZOLEDRONIC ACID 4 MG/100ML IV SOLN
4.0000 mg | Freq: Once | INTRAVENOUS | Status: AC
  Administered 2021-11-11: 4 mg via INTRAVENOUS
  Filled 2021-11-11: qty 100

## 2021-11-11 MED ORDER — SODIUM CHLORIDE 0.9% FLUSH
10.0000 mL | Freq: Once | INTRAVENOUS | Status: AC | PRN
  Administered 2021-11-11: 10 mL

## 2021-11-11 MED ORDER — HEPARIN SOD (PORK) LOCK FLUSH 100 UNIT/ML IV SOLN
250.0000 [IU] | Freq: Once | INTRAVENOUS | Status: AC | PRN
  Administered 2021-11-11: 250 [IU]

## 2021-11-11 MED ORDER — SODIUM CHLORIDE 0.9 % IV SOLN: Freq: Once | INTRAVENOUS | Status: AC

## 2021-11-11 NOTE — Patient Instructions (Signed)
Zoledronic Acid Injection (Cancer) What is this medication? ZOLEDRONIC ACID (ZOE le dron ik AS id) treats high calcium levels in the blood caused by cancer. It may also be used with chemotherapy to treat weakened bones caused by cancer. It works by slowing down the release of calcium from bones. This lowers calcium levels in your blood. It also makes your bones stronger and less likely to break (fracture). It belongs to a group of medications called bisphosphonates. This medicine may be used for other purposes; ask your health care provider or pharmacist if you have questions. COMMON BRAND NAME(S): Zometa, Zometa Powder What should I tell my care team before I take this medication? They need to know if you have any of these conditions: Dehydration Dental disease Kidney disease Liver disease Low levels of calcium in the blood Lung or breathing disease, such as asthma Receiving steroids, such as dexamethasone or prednisone An unusual or allergic reaction to zoledronic acid, other medications, foods, dyes, or preservatives Pregnant or trying to get pregnant Breast-feeding How should I use this medication? This medication is injected into a vein. It is given by your care team in a hospital or clinic setting. Talk to your care team about the use of this medication in children. Special care may be needed. Overdosage: If you think you have taken too much of this medicine contact a poison control center or emergency room at once. NOTE: This medicine is only for you. Do not share this medicine with others. What if I miss a dose? Keep appointments for follow-up doses. It is important not to miss your dose. Call your care team if you are unable to keep an appointment. What may interact with this medication? Certain antibiotics given by injection Diuretics, such as bumetanide, furosemide NSAIDs, medications for pain and inflammation, such as ibuprofen or naproxen Teriparatide Thalidomide This list  may not describe all possible interactions. Give your health care provider a list of all the medicines, herbs, non-prescription drugs, or dietary supplements you use. Also tell them if you smoke, drink alcohol, or use illegal drugs. Some items may interact with your medicine. What should I watch for while using this medication? Visit your care team for regular checks on your progress. It may be some time before you see the benefit from this medication. Some people who take this medication have severe bone, joint, or muscle pain. This medication may also increase your risk for jaw problems or a broken thigh bone. Tell your care team right away if you have severe pain in your jaw, bones, joints, or muscles. Tell you care team if you have any pain that does not go away or that gets worse. Tell your dentist and dental surgeon that you are taking this medication. You should not have major dental surgery while on this medication. See your dentist to have a dental exam and fix any dental problems before starting this medication. Take good care of your teeth while on this medication. Make sure you see your dentist for regular follow-up appointments. You should make sure you get enough calcium and vitamin D while you are taking this medication. Discuss the foods you eat and the vitamins you take with your care team. Check with your care team if you have severe diarrhea, nausea, and vomiting, or if you sweat a lot. The loss of too much body fluid may make it dangerous for you to take this medication. You may need bloodwork while taking this medication. Talk to your care team if   you wish to become pregnant or think you might be pregnant. This medication can cause serious birth defects. What side effects may I notice from receiving this medication? Side effects that you should report to your care team as soon as possible: Allergic reactions--skin rash, itching, hives, swelling of the face, lips, tongue, or  throat Kidney injury--decrease in the amount of urine, swelling of the ankles, hands, or feet Low calcium level--muscle pain or cramps, confusion, tingling, or numbness in the hands or feet Osteonecrosis of the jaw--pain, swelling, or redness in the mouth, numbness of the jaw, poor healing after dental work, unusual discharge from the mouth, visible bones in the mouth Severe bone, joint, or muscle pain Side effects that usually do not require medical attention (report to your care team if they continue or are bothersome): Constipation Fatigue Fever Loss of appetite Nausea Stomach pain This list may not describe all possible side effects. Call your doctor for medical advice about side effects. You may report side effects to FDA at 1-800-FDA-1088. Where should I keep my medication? This medication is given in a hospital or clinic. It will not be stored at home. NOTE: This sheet is a summary. It may not cover all possible information. If you have questions about this medicine, talk to your doctor, pharmacist, or health care provider.  2023 [employer]/Gold Standard (2021-04-16 00:00:00) Leuprolide Solution for Injection What is this medication? LEUPROLIDE (loo PROE lide) reduces the symptoms of prostate cancer. It works by decreasing levels of the hormone testosterone in the body. This prevents prostate cancer cells from spreading or growing. This medicine may be used for other purposes; ask your health care provider or pharmacist if you have questions. COMMON BRAND NAME(S): Lupron What should I tell my care team before I take this medication? They need to know if you have any of these conditions: Diabetes Heart attack Heart disease High blood pressure High cholesterol Pain or difficulty passing urine Spinal cord metastasis Stroke Tobacco use An unusual or allergic reaction to leuprolide, other medications, foods, dyes, or preservatives Pregnant or trying to get  pregnant Breast-feeding How should I use this medication? This medication is for injection under the skin or into a muscle. You will be taught how to prepare and give this medication. Use exactly as directed. Take your medication at regular intervals. Do not take it more often than directed. It is important that you put your used needles and syringes in a special sharps container. Do not put them in a trash can. If you do not have a sharps container, call your care team to get one. A special MedGuide will be given to you by the pharmacist with each prescription and refill. Be sure to read this information carefully each time. Talk to your care team about the use of this medication in children. While this medication may be prescribed for children as young as 8 years for selected conditions, precautions do apply. Overdosage: If you think you have taken too much of this medicine contact a poison control center or emergency room at once. NOTE: This medicine is only for you. Do not share this medicine with others. What if I miss a dose? If you miss a dose, take it as soon as you can. If it is almost time for your next dose, take only that dose. Do not take double or extra doses. What may interact with this medication? Do not take this medication with any of the following: Chasteberry Cisapride Dronedarone Pimozide Thioridazine This medication   may also interact with the following: Estrogen or progestin hormones Herbal or dietary supplements, like black cohosh or DHEA Other medications that cause heart rhythm changes Testosterone This list may not describe all possible interactions. Give your health care provider a list of all the medicines, herbs, non-prescription drugs, or dietary supplements you use. Also tell them if you smoke, drink alcohol, or use illegal drugs. Some items may interact with your medicine. What should I watch for while using this medication? Visit your care team for regular  checks on your progress. During the first week, your symptoms may get worse, but then will improve as you continue your treatment. You may get hot flashes, increased bone pain, increased difficulty passing urine, or an aggravation of nerve symptoms. Discuss these effects with your care team, some of them may improve with continued use of this medication. Patients may experience a menstrual cycle or spotting during the first 2 months of therapy with this medication. If this continues, contact your care team. This medication may increase blood sugar. The risk may be higher in patients who already have diabetes. Ask your care team what you can do to lower your risk of diabetes while taking this medication. What side effects may I notice from receiving this medication? Side effects that you should report to your care team as soon as possible: Allergic reactions--skin rash, itching, hives, swelling of the face, lips, tongue, or throat Heart attack--pain or tightness in the chest, shoulders, arms, or jaw, nausea, shortness of breath, cold or clammy skin, feeling faint or lightheaded Heart rhythm changes--fast or irregular heartbeat, dizziness, feeling faint or lightheaded, chest pain, trouble breathing High blood sugar (hyperglycemia)--increased thirst or amount of urine, unusual weakness or fatigue, blurry vision Mood swings, irritability, hostility Seizures Stroke--sudden numbness or weakness of the face, arm, or leg, trouble speaking, confusion, trouble walking, loss of balance or coordination, dizziness, severe headache, change in vision Thoughts of suicide or self-harm, worsening mood, feelings of depression Side effects that usually do not require medical attention (report to your care team if they continue or are bothersome): Bone pain Change in sex drive or performance General discomfort and fatigue Hot flashes Muscle pain Pain, redness, or irritation at injection site Swelling of the ankles,  hands, or feet This list may not describe all possible side effects. Call your doctor for medical advice about side effects. You may report side effects to FDA at 1-800-FDA-1088. Where should I keep my medication? Keep out of the reach of children and pets. Store below 25 degrees C (77 degrees F). Do not freeze. Protect from light. Get rid of any unused medication after the expiration date. To get rid of medications that are no longer needed or have expired: Take the medication to a medication take-back program. Check with your pharmacy or law enforcement to find a location. If you cannot return the medication, ask your pharmacist or care team how to get rid of this medication safely. NOTE: This sheet is a summary. It may not cover all possible information. If you have questions about this medicine, talk to your doctor, pharmacist, or health care provider.  2023 [employer]/Gold Standard (2021-01-30 00:00:00)  

## 2021-11-20 ENCOUNTER — Ambulatory Visit (HOSPITAL_COMMUNITY)
Admission: RE | Admit: 2021-11-20 | Discharge: 2021-11-20 | Disposition: A | Discharge status: Home / Self Care | Payer: No Typology Code available for payment source | Source: Ambulatory Visit | Attending: Hematology and Oncology | Admitting: Hematology and Oncology

## 2021-11-20 DIAGNOSIS — C7951 Secondary malignant neoplasm of bone: Secondary | ICD-10-CM | POA: Diagnosis present

## 2021-11-20 DIAGNOSIS — C7952 Secondary malignant neoplasm of bone marrow: Secondary | ICD-10-CM | POA: Diagnosis present

## 2021-11-20 DIAGNOSIS — C61 Malignant neoplasm of prostate: Secondary | ICD-10-CM | POA: Insufficient documentation

## 2021-11-20 DIAGNOSIS — R972 Elevated prostate specific antigen [PSA]: Secondary | ICD-10-CM | POA: Diagnosis present

## 2021-11-20 MED ORDER — PIFLIFOLASTAT F 18 (PYLARIFY) INJECTION
9.0000 | Freq: Once | INTRAVENOUS | Status: AC
  Administered 2021-11-20: 9 via INTRAVENOUS

## 2021-11-25 ENCOUNTER — Encounter: Payer: Self-pay | Admitting: Oncology

## 2021-11-25 ENCOUNTER — Other Ambulatory Visit: Payer: Self-pay | Admitting: Oncology

## 2021-11-25 ENCOUNTER — Inpatient Hospital Stay: Payer: No Typology Code available for payment source | Attending: Hematology and Oncology | Admitting: Oncology

## 2021-11-25 ENCOUNTER — Inpatient Hospital Stay: Payer: No Typology Code available for payment source

## 2021-11-25 ENCOUNTER — Inpatient Hospital Stay: Payer: No Typology Code available for payment source | Admitting: Hematology and Oncology

## 2021-11-25 VITALS — BP 153/72 | HR 69 | Temp 97.9°F | Resp 18 | Ht 65.5 in | Wt 194.1 lb

## 2021-11-25 DIAGNOSIS — Z5189 Encounter for other specified aftercare: Secondary | ICD-10-CM | POA: Diagnosis not present

## 2021-11-25 DIAGNOSIS — C7951 Secondary malignant neoplasm of bone: Secondary | ICD-10-CM

## 2021-11-25 DIAGNOSIS — C61 Malignant neoplasm of prostate: Secondary | ICD-10-CM

## 2021-11-25 DIAGNOSIS — Z5111 Encounter for antineoplastic chemotherapy: Secondary | ICD-10-CM | POA: Insufficient documentation

## 2021-11-25 DIAGNOSIS — C7800 Secondary malignant neoplasm of unspecified lung: Secondary | ICD-10-CM | POA: Diagnosis not present

## 2021-11-25 DIAGNOSIS — C7952 Secondary malignant neoplasm of bone marrow: Secondary | ICD-10-CM | POA: Diagnosis not present

## 2021-11-25 LAB — CBC AND DIFFERENTIAL
HCT: 40 — AB (ref 41–53)
Hemoglobin: 13.6 (ref 13.5–17.5)
Neutrophils Absolute: 4.82
Platelets: 275 10*3/uL (ref 150–400)
WBC: 7.9

## 2021-11-25 LAB — BASIC METABOLIC PANEL
BUN: 15 (ref 4–21)
CO2: 23 — AB (ref 13–22)
Chloride: 105 (ref 99–108)
Creatinine: 0.6 (ref 0.6–1.3)
Glucose: 158
Potassium: 3.5 mEq/L (ref 3.5–5.1)
Sodium: 138 (ref 137–147)

## 2021-11-25 LAB — HEPATIC FUNCTION PANEL
ALT: 22 U/L (ref 10–40)
AST: 29 (ref 14–40)
Alkaline Phosphatase: 56 (ref 25–125)
Bilirubin, Total: 0.5

## 2021-11-25 LAB — COMPREHENSIVE METABOLIC PANEL
Albumin: 4.4 (ref 3.5–5.0)
Calcium: 9.9 (ref 8.7–10.7)

## 2021-11-25 LAB — PSA: Prostatic Specific Antigen: 438.87 ng/mL — ABNORMAL HIGH (ref 0.00–4.00)

## 2021-11-25 LAB — CBC: RBC: 4.58 (ref 3.87–5.11)

## 2021-11-25 NOTE — Progress Notes (Signed)
START ON PATHWAY REGIMEN - Prostate ? ? ?  A cycle is every 21 days: ?    Prednisone  ?    Docetaxel  ? ?**Always confirm dose/schedule in your pharmacy ordering system** ? ?Patient Characteristics: ?Adenocarcinoma, Recurrent/New Systemic Disease (Including Biochemical Recurrence), Castration Resistant, M1, Prior Novel Hormonal Agent, No Molecular Alteration or Targeted Therapy Exhausted, No Prior Docetaxel ?Histology: Adenocarcinoma ?Therapeutic Status: Recurrent/New Systemic Disease (Including Biochemical Recurrence) ? ?Intent of Therapy: ?Non-Curative / Palliative Intent, Discussed with Patient ?

## 2021-11-25 NOTE — Progress Notes (Signed)
[place] Telephone:([phone]   [phone]   Patient Care Team: Earline Mayotte, MD as PCP - Marisa Sprinkles, MD as Consulting Physician (Urology) [name], MD as Consulting Physician (Oncology) Gatha Mayer, MD as Consulting Physician (Radiation Oncology)   Name of the patient: Marvin Johnson  [id number]  November 19, 1950   Date of visit: 11/26/21  Diagnosis-metastatic prostate cancer  Chief complaint/Reason for visit- Initial Meeting for [facility], preparing for starting chemotherapy for progressive disease  Heme/Onc history:  Oncology History  Malignant neoplasm of prostate ([place])  03/30/2014 Cancer Staging   Staging form: Prostate, AJCC 8th Edition - Clinical stage from 03/30/2014: Stage IVA (cT3b, cN1, cM0, PSA: 51.8, Grade Group: 4) - Signed by [name], MD on 11/14/2020 Histopathologic type: Adenocarcinoma, NOS Stage prefix: Initial diagnosis Prostate specific antigen (PSA) range: 20 or greater Gleason primary pattern: 4 Gleason secondary pattern: 4 Gleason score: 8 Histologic grading system: 5 grade system Location of positive needle core biopsies: Beyond primary site Prognostic indicators: May 2016 Robotic prostatectomy with mult. Pos margins, extra prostatic extension, 2/6 pos nodes. Scan June 2016 positive at T10  PSA up to 216.3 by August Placed on lupron and casodex Stage used in treatment planning: Yes National guidelines used in treatment planning: Yes Type of national guideline used in treatment planning: NCCN   02/18/2020 Initial Diagnosis   Malignant neoplasm of prostate ([place])   12/01/2021 -  Chemotherapy   Patient is on Treatment Plan : PROSTATE Docetaxel (75) + Prednisone q21d     Secondary malignant neoplasm of bone and bone marrow ([place])  02/18/2020 Initial Diagnosis   Secondary malignant neoplasm of bone and bone marrow ([place])   12/01/2021 -   Chemotherapy   Patient is on Treatment Plan : PROSTATE Docetaxel (75) + Prednisone q21d       Interval history-  The patient presents to chemo care clinic today for initial meeting in preparation for starting chemotherapy. I introduced the chemo care clinic and we discussed that the role of the clinic is to assist those who are at an increased risk of emergency room visits and/or complications during the course of chemotherapy treatment. We discussed that the increased risk takes into account factors such as age, performance status, and co-morbidities. We also discussed that for some, this might include barriers to care such as not having a primary care provider, lack of insurance/transportation, or not being able to afford medications. We discussed that the goal of the program is to help prevent unplanned ER visits and help reduce complications during chemotherapy. We do this by discussing specific risk factors to each individual and identifying ways that we can help improve these risk factors and reduce barriers to care.   Allergies  Allergen Reactions  . Atorvastatin   . Rosuvastatin Other (See Comments)    Other reaction(s): Muscle pain  . Simvastatin Other (See Comments)    Other reaction(s): Joint pain, Muscle pain  . Sulfa Antibiotics   . Sulfamethoxazole-Trimethoprim Other (See Comments)    Unknown as to interaction was a baby.    Past Medical History:  Diagnosis Date  . Hyperlipidemia   . Malignant neoplasm of prostate ([place])   . Supraventricular tachycardia [facility])     Past Surgical History:  Procedure Laterality Date  . PROSTATECTOMY  07/2014  . TONSILLECTOMY      Social History   Socioeconomic History  . Marital status: Divorced    Spouse  name: Not on file  . Number of children: Not on file  . Years of education: Not on file  . Highest education level: Not on file  Occupational History  . Not on file  Tobacco Use  . Smoking status: Never  . Smokeless tobacco:  Never  Substance and Sexual Activity  . Alcohol use: Not Currently  . Drug use: Never  . Sexual activity: Not on file  Other Topics Concern  . Not on file  Social History Narrative  . Not on file   Social Determinants of Health   Financial Resource Strain: Not on file  Food Insecurity: Not on file  Transportation Needs: Not on file  Physical Activity: Not on file  Stress: Not on file  Social Connections: Not on file  Intimate Partner Violence: Not on file    Family History  Problem Relation Age of Onset  . Breast cancer Mother 53  . Ovarian cancer Mother 40  . Breast cancer Paternal Grandmother 36     Current Outpatient Medications:  .  Alirocumab (PRALUENT [place]), Inject into the skin. One injection every 2 weeks, Disp: , Rfl:  .  aspirin 81 MG EC tablet, Take 81 mg by mouth daily., Disp: , Rfl:  .  Cholecalciferol 25 MCG (1000 UT) tablet, Take by mouth., Disp: , Rfl:  .  clopidogrel (PLAVIX) 75 MG tablet, Take 75 mg by mouth daily., Disp: , Rfl:  .  diphenhydrAMINE (SOMINEX) 25 MG tablet, Take 50 mg by mouth 2 (two) times daily., Disp: , Rfl:  .  ezetimibe (ZETIA) 10 MG tablet, Take by mouth., Disp: , Rfl:  .  isosorbide mononitrate (IMDUR) 30 MG 24 hr tablet, Take 1 tablet by mouth every 8 (eight) hours as needed., Disp: , Rfl:  .  leuprolide (LUPRON) 22.5 MG injection, Inject 22.5 mg into the muscle every 3 (three) months., Disp: , Rfl:  .  metoprolol tartrate (LOPRESSOR) 25 MG tablet, TAKE ONE-HALF TABLET BY MOUTH TWICE A DAY FOR HEART, Disp: , Rfl:  .  morphine (MS CONTIN) 15 MG 12 hr tablet, Take 1 tablet (15 mg total) by mouth every 12 (twelve) hours., Disp: 60 tablet, Rfl: 0 .  Multiple Vitamins-Minerals (MULTIVITAMIN WITH MINERALS) tablet, Take 1 tablet by mouth daily., Disp: , Rfl:  .  nitroGLYCERIN (NITROSTAT) 0.4 MG SL tablet, DISSOLVE ONE TABLET UNDER THE TONGUE EVERY 5 MINUTES AS NEEDED FOR CHEST PAIN (REPEAT EVERY 5 MINUTES IF NEEDED FOR A TOTAL OF 3 TABLETS IN  15 MINUTES. IF NO RELIEF, CALL 911.), Disp: , Rfl:  .  Omega-3 Fatty Acids (FISH OIL) 1000 MG CAPS, Take by mouth., Disp: , Rfl:  .  Royal Jelly-Bee Pollen-Ginseng (KOREAN GINSENG COMPLEX) 150-250-50 MG CAPS, Take by mouth., Disp: , Rfl:  .  senna (SENOKOT) 8.6 MG TABS tablet, Take 1 tablet by mouth., Disp: , Rfl:  .  Zoledronic Acid (ZOMETA) 4 MG/100ML IVPB, Inject 4 mg into the vein., Disp: , Rfl:      Latest Ref Rng & Units 10/30/2021   12:00 AM  CMP  BUN 4 - 21 15      Creatinine 0.6 - 1.3 0.7      Sodium 137 - 147 137      Potassium 3.5 - 5.1 mEq/L 4.1      Chloride 99 - 108 106      CO2 13 - 22 23      Calcium 8.7 - 10.7 9.6      Alkaline Phos  25 - 125 57      AST 14 - 40 41      ALT 10 - 40 U/L 23         This result is from an external source.       Latest Ref Rng & Units 10/30/2021   12:00 AM  CBC  WBC  9.5      Hemoglobin 13.5 - 17.5 13.4      Hematocrit 41 - 53 40      Platelets 150 - 400 K/uL 251         This result is from an external source.     No images are attached to the encounter.  NM PET (PSMA) SKULL TO MID THIGH  Result Date: 11/23/2021 CLINICAL DATA:  Prostate carcinoma with biochemical recurrence. EXAM: NUCLEAR MEDICINE PET SKULL BASE TO THIGH TECHNIQUE: 9.4 mCi F18 Piflufolastat (Pylarify) was injected intravenously. Full-ring PET imaging was performed from the skull base to thigh after the radiotracer. CT data was obtained and used for attenuation correction and anatomic localization. COMPARISON:  12/24/2020 FINDINGS: NECK No radiotracer activity in neck lymph nodes. Incidental CT finding: None. CHEST No tracer avid supraclavicular, axillary, mediastinal, or hilar lymph nodes. Interval development of multiple small tracer pulmonary nodules which are concerning for metastatic disease. These nodules are all very small measuring (no more than 5 mm) and are technically too small to characterize by PET-CT. However a few of these nodules are tracer avid  including: -Posteromedial right upper lobe nodule measures 5 mm with SUV max of 4.89, image 81/4. -Nodule within the inferior lingula measures 5 mm within SUV max of 5.1, image 94/4. -Nodule within the medial right apex measures 3 mm with SUV max of 3.18, image 65/4. Incidental CT finding: Aortic atherosclerosis and multi vessel coronary artery calcifications. ABDOMEN/PELVIS Prostate: No focal activity in the prostate bed. Lymph nodes: No abnormal radiotracer accumulation within pelvic or abdominal nodes. Liver: No evidence of liver metastasis. Incidental CT finding: None. SKELETON There is been marked interval progression of tracer avid bone metastases. Innumerable tracer avid bone lesions are now identified compared with solitary tracer avid bone metastases noted on the previous exam. New index lesions include: -FDG avid lesion within the L3 vertebral body with SUV max of 36.52, image 139/4. No corresponding CT changes identified. -Tracer avid bone lesion within the right iliac wing has an SUV max of 34.4, image 165/4. No corresponding CT changes identified. -parasymphyseal lesion within the left pubic bone has an SUV max of 42.15, image 191/4. No corresponding CT changes noted. -Previous tracer avid sclerotic bone lesion involving the right seventh rib has an SUV max of 27.67 on today's study, image 110/4. Previously 27.6 on the previous exam. IMPRESSION: 1. Marked interval progression tracer avid bone metastases. 2. Interval development of multiple tiny lung nodules. Several of these nodules are tracer avid and are worrisome for pulmonary metastases. 3. Aortic Atherosclerosis (ICD10-I70.0). Coronary artery calcifications. Electronically Signed   By: Kerby Moors M.D.   On: 11/23/2021 10:50     Assessment and plan- Patient is a 71 y.o. male who presents to [facility] for initial meeting in preparation for starting chemotherapy for the treatment of progressive metastatic prostate cancer.   Chemo Care  Clinic/High Risk for ER/Hospitalization during chemotherapy- We discussed the role of the chemo care clinic and identified patient specific risk factors. I discussed that patient was identified as high risk primarily based on:  Patient has past medical history positive for:  Past Medical History:  Diagnosis Date  . Hyperlipidemia   . Malignant neoplasm of prostate ([place])   . Supraventricular tachycardia ([place])     Patient has past surgical history positive for: Past Surgical History:  Procedure Laterality Date  . PROSTATECTOMY  07/2014  . TONSILLECTOMY      Provided general information including the following:  1.  Date of education: 12/01/2021 2.  Physician name: Dr. Hinton Rao 3.  Diagnosis: Metastatic prostate cancer 4.  Stage: Stage IVA 5.  Control 6.  Chemotherapy plan including drugs and how often: Docetaxel every 3 weeks with prednisone 5 mg twice daily 7.  Start date: 12/01/2021 8.  Other referrals: None at this time 9.  The patient is to call our office with any questions or concerns.  Our office number [phone], if after hours or on the weekend, call the same number and wait for the answering service.  There is always an oncologist on call. 10.  Medications prescribed: ondansetron, prochlorperazine, prednisone and dexamethasone 11.  The patient has verbalized understanding of the treatment plan and has no barriers to adherence or understanding.   Obtained signed consent from patient.   Discussed symptoms including:  1.  Low blood counts including red blood cells, white blood cells and platelets. 2. Infection including to avoid large crowds, wash hands frequently, and stay away from people who were sick.  If fever develops of 100.4 or higher, call our office. 3.  Mucositis-given instructions on mouth rinse (baking soda and salt mixture).  Keep mouth clean.  Use soft bristle toothbrush.  If mouth sores develop, call our clinic. 4.  Nausea/vomiting-gave prescriptions for  ondansetron 4 mg every 4 hours as needed for nausea, may take around the clock if persistent.  Prochlorperazine 10 mg every 6 hours, may take around the clock if persistent. 5.  Diarrhea-use over-the-counter Imodium.  Call clinic if not controlled. 6.  Constipation-use senna-S, 1 to 2 tablets twice a day.  If no BM in 2 to 3 days call the clinic. 7.  Loss of appetite-try to eat small meals every 2-3 hours.  Call clinic if not eating or drinking. 8.  Taste changes-zinc 500 mg daily.  If becomes severe call clinic. 9.  Avoiding alcoholic beverages. 10.  Drink 2 to 3 quarts of water per day. Call clinic if not able to drink. 11.  Peripheral neuropathy-patient to call if numbness or tingling in hands or feet is persistent     Instructed the patient to take dexamethasone 4 mg 2 tablets twice daily the day before chemo and for 2 days after chemo.  Instructed him to continue prednisone 5 mg twice daily throughout chemotherapy.   Gave information on the supportive care team and how to contact them regarding services.  Discussed advanced directives.  The patient does not have their advanced directives.   We discussed that social determinants of health may have significant impacts on health and outcomes for cancer patients.  Today we discussed specific social determinants of performance status, alcohol use, depression, financial needs, food insecurity, housing, interpersonal violence, social connections, stress, tobacco use, and transportation.    After lengthy discussion the following were identified as areas of need:   Outpatient services: We discussed options including home based and outpatient services, DME, nutrition counseling, and palliative care program. We discussed that patients who participate in regular physical activity report fewer negative impacts of cancer and treatments and report less fatigue.   Financial Concerns: We discussed that living with cancer can  create tremendous financial  burden.  We discussed options for assistance. I asked that if assistance is needed in affording medications or paying bills to please let us know so that we can provide assistance. We discussed options for food including social services.  Referral to Social work: Our social worker will contact the patient.  Support groups: We discussed options for support groups at [facility]. If these are of interest, patient can notify either myself or primary nursing team.  Transportation: We discussed options for transportation.  The patient will contact our office if he requires assistance with transportation.  Palliative care services: We have palliative care services available in the cancer center to discuss goals of care and advanced care planning.  Please let us know if you have any questions or would like to speak to our palliative care practitioner.  Symptom Management Clinic: We discussed our symptom management clinic which is available for acute concerns while receiving treatment such as nausea, vomiting or diarrhea.  We can be reached via telephone at [phone].  We are available for virtual or in person visits on the same day from 9 to 4 PM Monday through Friday.   He denies needing specific assistance at this time.  He will be followed by Dr. Remi Deter clinical team.   Disposition: RTC on 12/11/2021  Visit Diagnosis 1. Malignant neoplasm of prostate ([place])   2. Secondary malignant neoplasm of bone and bone marrow ([facility])   3. Erroneous encounter - disregard     I discussed the assessment and treatment plan with the patient and his friend.  The patient was provided an opportunity to ask questions and all were answered. The patient expressed understanding and was in agreement with this plan. He also understands that he can call clinic at any time with any questions, concerns, or complaints.   I provided 30 minutes of  face to face time  during this encounter, and > 50% was spent counseling as  documented under my assessment & plan.   Hawk Mones A. Georgann Housekeeper, [place] [phone]  This encounter was created in error - please disregard.

## 2021-11-25 NOTE — Progress Notes (Signed)
[facility]  [address] 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 He has been on enzalutamide 160 mg daily, as well as leuprolide 22.5 mg, and zoledronic acid every 3 months.  His pain is controlled with MS Contin 15 mg twice daily with occasional Tylenol for breakthrough.   He has been on enzalutamide since June 2018.  He then had a teadily rising PSA, up to 83.49 in November 2022.  PSMA PET in October 2022 revealed an oligometastasis skeletal lesion it in the anterior right 7th rib, with increased activity, and very subtle sclerotic change at T10, but no evidence of progressive disease.The PSA has continued to fluctuate up and down.  It was down to 31 in February of 2023, then back up to 18 in May.  It then increased dramatically to 334 in August and he was scheduled for a PSMA PET scan.  This revealed severe progression of disease with several tiny lung nodules which did have hypermetabolic activity with SUV up to 5.  He had increased bone lesions which are now innumerable, including major lesions at L3, the right iliac wing, and the left pubic bone. These were not visible on CT scan.  Secondary malignant neoplasm of bone and bone marrow ([place]) Hel continues zoledronic acid every 3 months in addition to his cancer therapy.  He now has marked progression of his bone metastases but minimal symptoms.     I have discussed the progression of disease with the patient and his companion Marvin Johnson.  This is a dramatic change in his bone metastases and now there are several tiny but suspicious lung lesions.  His PSA had jumped from 78 to 334, which prompted the new scan.  We will stop his enzalutamide and notify the [place].  I have recommended Taxotere chemotherapy  every 3 weeks and reviewed the schedule and potential toxicities with them.  We want to get started soon and could probably do so by early next week.  I would want to check him 10 days later with repeat labs.  We will get a new set of labs for a new baseline and schedule chemotherapy education. The patient and his companion understand the plans discussed today and are in agreement with them.  He knows to contact our office if he develops concerns prior to his next appointment.  Their questions were answered.   I provided 35 minutes of face-to-face time during this encounter and > 50% was spent counseling as documented under my assessment and plan.    Marvin Kaplan, MD  [facility] [address] [place] [address] Dept: [phone] Dept Fax: [phone]   No orders of the defined types were placed in this encounter.     CHIEF COMPLAINT:  CC: Metastatic prostate cancer to bone, progressive  Current Treatment: Leuprolide/zoledronic acid and docetaxel  HISTORY OF PRESENT ILLNESS:  Marvin Johnson is a 71 year old male with a history of stage IV (T3b N1 M0) prostate cancer diagnosed in January 2016 when he was found to have a PSA of 51.8.  He was treated with a laparoscopic prostatectomy in May 2016.  Pathology revealed adenocarcinoma with a Gleason 8.  There was a positive posterior margin with extraprostatic extension, as well  as left retro-urethral tissue positive and extension to the seminal vesicles bilaterally with multiple positive margins.  Two lymph nodes from the right side were negative, but 2/4 lymph nodes from the left side were positive for metastasis. Repeat bone scan in June 2016 revealed a new lesion at T10, which was not present on his baseline bone scan.  PSA at Dr. Ara Johnson office in early August 2016 remained elevated at 56.4.   We began seeing him in August 2016. The PSA was up to 216.3 in late  August 2016, so hormonal therapy was recommended  X-rays of the thoracic spine revealed osteoarthritis and scoliosis, as well as evidence of demineralization, but we could not visualize a metastatic lesion.[name] He started leuprolide in September 2016.  The PSA initially dropped steadily with leuprolide.  Bone density scan in November 2016 revealed osteopenia, with a T-score of -2.1 in the spine and a T-score of -0.9 in the femur.  He had been taking vitamin-D 1000 international units daily, but not calcium, so calcium 600 mg twice daily was added at that time.    Leuprolide was held in February 2017 because of chest pain.  EKG was unremarkable.  He went to the [place] and he was referred to the [facility] for placement of 2 coronary artery stents for 95% and 99% occlusions.  He then had a third coronary artery stent placed later in the summer.  Leuprolide was resumed in March 2017.  In September 2017, he had an increase in the PSA, so we repeated a bone scan.  This revealed intense uptake within the right posterior elements of T10, but no other areas of metastasis.  We then added bicalutamide 50 mg daily to his leuprolide.     He had a steadily increasing PSA despite the bicalutamide, so this was discontinued in March 2018.  He eventually was placed on MS Contin 15 mg twice daily, with oxycodone 10 mg every 4 hours as needed for breakthrough pain.  MRI thoracic spine in April 2018 revealed increasing tumor at T10 with epidural spread.  Bone scan at that time revealed increased activity in the T10 lesion, which was stable.  There was a questionable tiny focus in the right ischium/acetabular rim.  Plain x-rays of the bilateral hips and pelvis did not reveal any focal abnormality. The PSA increased from 27.5 in March, to 72.2 in May, then to 92.2 in June 2018.  He was referred for radiotherapy to the T10 lesion due to the severe pain with improvement in his pain. He was started on zoledronic acid along with his leuprolide  in June 2018.  He had a Port-A-Cath placed in anticipation of chemotherapy, but then we recommended that he be placed on enzalutamide 160 mg daily instead, in order to delay the time to initiation of chemotherapy.  He started enzalutamide in June 2018.  The PSA became undetectable in October 2018.  We continued MS Contin, but discontinued the oxycodone. He did not tolerate meloxicam, so uses Tylenol as needed for breakthrough pain.     He had a stress test in January 2020 and was referred to [facility] for coronary artery stenting, which was done in February.  Zoledronic acid had been given monthly for over 2 years, so was changed to every 3 months in October 2020.   The PSA started to increase in January 2021 at which time it was 0.2.  CT chest, abdomen and pelvis in March 2021 not reveal any lymphadenopathy or soft  tissue metastatic disease.  Hepatic steatosis was seen.  There was a stable sclerotic osseous lesion of the T10 vertebral body, as well as a subtle sclerotic lesion of the L3 vertebral body, also possibly a small metastatic lesion.  Whole body bone scan did not reveal continued increased activity of T10 and right acetabulum posteriorly.  The PSA was 0.5 in March.  PSA had increased to 3.0 in June.  [facility]umin PET imaging from July did not reveal any evidence of active prostate carcinoma, local recurrence, or metastatic disease. The PSA continued to slowly rise and was up to 5.9 in September, 10.9 in October, and 16.3 in December.     The PSA went up to 23.81 in March 2022, so an [place] PET scan was ordered which revealed an oligometastasis skeletal lesion in the anterior right 7th rib with very subtle sclerotic change at this level. PSA in June decreased to 18.6, but then in September increased to 58.  In view of the significant rise in his PSA, a prostate specific PET scan was ordered.  PSMA PET in October 2022 revealed a persistent focus of intense radiotracer activity in the  anterior RIGHT seventh rib with increased in activity from comparison exam and subtle sclerotic changes on CT. Findings remain consistent with oligometastatic skeletal metastasis. Sclerotic lesions in the T10 vertebral body without radiotracer activity potentially represented prior treated prostate cancer metastasis.  We recommended he continue his current treatment with enzalutamide, leuprolide and zoledronic acid.  PSA in November continued to increase to 83.49.  Even though his PSA continued to steadily increase, we did not recommend a change in therapy based on this alone.  He had no severe pain and no increase in pain and was having a good quality of life.  The PSA has continued to fluctuate up and down and was 31 in February and 78 in May.   Oncology History  Malignant neoplasm of prostate ([place])  03/30/2014 Cancer Staging   Staging form: Prostate, AJCC 8th Edition - Clinical stage from 03/30/2014: Stage IVA (cT3b, cN1, cM0, PSA: 51.8, Grade Group: 4) - Signed by Marvin Kaplan, MD on 11/14/2020 Histopathologic type: Adenocarcinoma, NOS Stage prefix: Initial diagnosis Prostate specific antigen (PSA) range: 20 or greater Gleason primary pattern: 4 Gleason secondary pattern: 4 Gleason score: 8 Histologic grading system: 5 grade system Location of positive needle core biopsies: Beyond primary site Prognostic indicators: May 2016 Robotic prostatectomy with mult. Pos margins, extra prostatic extension, 2/6 pos nodes. Scan June 2016 positive at T10  PSA up to 216.3 by August Placed on lupron and casodex Stage used in treatment planning: Yes National guidelines used in treatment planning: Yes Type of national guideline used in treatment planning: NCCN   02/18/2020 Initial Diagnosis   Malignant neoplasm of prostate ([place])   12/01/2021 -  Chemotherapy   Patient is on Treatment Plan : PROSTATE Docetaxel (75) + Prednisone q21d     Secondary malignant neoplasm of bone and bone marrow ([place])   02/18/2020 Initial Diagnosis   Secondary malignant neoplasm of bone and bone marrow ([place])   12/01/2021 -  Chemotherapy   Patient is on Treatment Plan : PROSTATE Docetaxel (75) + Prednisone q21d         INTERVAL HISTORY:  Marvin Johnson is here today for repeat clinical assessment.  He has been on the enzalutamide for 5 years and it has kept his disease stable.  However his most recent PSA increased from 78.11 to 334.0 and so I ordered a PSMA PET  scan. This revealed severe progression of disease with several tiny lung nodules which did have hypermetabolic activity with SUV up to 5.  He had increased bone lesions which are now innumerable, including major lesions at L3, the right iliac wing, and the left pubic bone. These were not visible on CT scan.  He is here with his companion Marvin Johnson and we have discussed the fact that we need to change his treatment and stop the enzalutamide. He does note some fatigue.  He denies increasing pain.  He still remains on MS Contin 15 mg every 12 hours and only requires Tylenol for breakthrough pain.  He denies new symptoms.  He denies fevers or chills. He denies pain. His appetite is good.  I have recommended that we initiate docetaxel chemotherapy IV every 3 weeks and reviewed the potential toxicities and schedule.  We can arrange for labs and chemotherapy education and start by next week.  I have reviewed these results and answered their questions.  REVIEW OF SYSTEMS:  Review of Systems  Constitutional:  Positive for fatigue (Mild). Negative for appetite change, chills, fever and unexpected weight change.  HENT:   Negative for lump/mass, mouth sores and sore throat.   Respiratory:  Negative for cough and shortness of breath.   Cardiovascular:  Negative for chest pain and leg swelling.  Gastrointestinal:  Negative for abdominal pain, constipation, diarrhea, nausea and vomiting.  Genitourinary:  Negative for difficulty urinating, dysuria, frequency and hematuria.    Musculoskeletal:  Negative for arthralgias, back pain and myalgias.  Skin:  Negative for itching, rash and wound.  Neurological:  Negative for dizziness, extremity weakness, headaches, light-headedness and numbness.  Hematological:  Negative for adenopathy.  Psychiatric/Behavioral:  Negative for depression and sleep disturbance. The patient is not nervous/anxious.      VITALS:  Blood pressure (!) 153/72, pulse 69, temperature 97.9 F (36.6 C), temperature source Oral, resp. rate 18, height 5' 5.5" (1.664 m), weight 194 lb 1.6 oz (88 kg), SpO2 93 %.  Wt Readings from Last 3 Encounters:  12/11/21 189 lb 11.2 oz (86 kg)  12/03/21 191 lb (86.6 kg)  12/01/21 190 lb (86.2 kg)    Body mass index is 31.81 kg/m.  Performance status (ECOG): 1 - Symptomatic but completely ambulatory  PHYSICAL EXAM:  Physical Exam Vitals and nursing note reviewed.  Constitutional:      General: He is not in acute distress.    Appearance: Normal appearance. He is normal weight.  HENT:     Head: Normocephalic and atraumatic.     Mouth/Throat:     Mouth: Mucous membranes are moist.     Pharynx: Oropharynx is clear. No oropharyngeal exudate or posterior oropharyngeal erythema.  Eyes:     General: No scleral icterus.    Extraocular Movements: Extraocular movements intact.     Conjunctiva/sclera: Conjunctivae normal.     Pupils: Pupils are equal, round, and reactive to light.  Cardiovascular:     Rate and Rhythm: Normal rate and regular rhythm.     Heart sounds: Normal heart sounds. No murmur heard.    No friction rub. No gallop.  Pulmonary:     Effort: Pulmonary effort is normal.     Breath sounds: Normal breath sounds. No wheezing, rhonchi or rales.  Abdominal:     General: Bowel sounds are normal. There is no distension.     Palpations: Abdomen is soft. There is no hepatomegaly, splenomegaly or mass.     Tenderness: There is no abdominal tenderness.  Musculoskeletal:        General: Normal range of  motion.     Cervical back: Normal range of motion and neck supple. No tenderness.     Right lower leg: No edema.     Left lower leg: No edema.  Lymphadenopathy:     Cervical: No cervical adenopathy.     Upper Body:     Right upper body: No supraclavicular or axillary adenopathy.     Left upper body: No supraclavicular or axillary adenopathy.     Lower Body: No right inguinal adenopathy. No left inguinal adenopathy.  Skin:    General: Skin is warm and dry.     Coloration: Skin is not jaundiced.     Findings: No rash.  Neurological:     Mental Status: He is alert and oriented to person, place, and time.     Cranial Nerves: No cranial nerve deficit.  Psychiatric:        Mood and Affect: Mood normal.        Behavior: Behavior normal.        Thought Content: Thought content normal.    LABS:      Latest Ref Rng & Units 12/11/2021   12:00 AM 10/30/2021   12:00 AM 07/30/2021   12:00 AM  CBC  WBC  41.9     9.5     7.1      Hemoglobin 13.5 - 17.5 12.5     13.4     13.4      Hematocrit 41 - 53 39     40     42      Platelets 150 - 400 K/uL 196     251     226         This result is from an external source.      Latest Ref Rng & Units 12/11/2021   12:00 AM 10/30/2021   12:00 AM 07/30/2021   12:00 AM  CMP  BUN 4 - '21 20     15     18      '$ Creatinine 0.6 - 1.3 0.7     0.7     0.9      Sodium 137 - 147 137     137     141      Potassium 3.5 - 5.1 mEq/L 4.1     4.1     4.3      Chloride 99 - 108 102     106     101      CO2 13 - '22 28     23     26      '$ Calcium 8.7 - 10.7 9.6     9.6     9.4      Alkaline Phos 25 - 125 127     57     55      AST 14 - 40 39     41     29      ALT 10 - 40 U/L '29     23     21         '$ This result is from an external source.     No results found for: "CEA1", "CEA" / No results found for: "CEA1", "CEA" Lab Results  Component Value Date   PSA1 334.0 (H) 10/30/2021   No results found for: "ASN053" No results found for: "CAN125"  No results found  for: "TOTALPROTELP", "ALBUMINELP", "A1GS", "A2GS", "BETS", "BETA2SER", "GAMS", "MSPIKE", "SPEI" No results found for: "TIBC", "FERRITIN", "IRONPCTSAT" No results found for: "LDH"  STUDIES:  NM PET (PSMA) SKULL TO MID THIGH  Result Date: 11/23/2021 CLINICAL DATA:  Prostate carcinoma with biochemical recurrence. EXAM: NUCLEAR MEDICINE PET SKULL BASE TO THIGH TECHNIQUE: 9.4 mCi F18 Piflufolastat (Pylarify) was injected intravenously. Full-ring PET imaging was performed from the skull base to thigh after the radiotracer. CT data was obtained and used for attenuation correction and anatomic localization. COMPARISON:  12/24/2020 FINDINGS: NECK No radiotracer activity in neck lymph nodes. Incidental CT finding: None. CHEST No tracer avid supraclavicular, axillary, mediastinal, or hilar lymph nodes. Interval development of multiple small tracer pulmonary nodules which are concerning for metastatic disease. These nodules are all very small measuring (no more than 5 mm) and are technically too small to characterize by PET-CT. However a few of these nodules are tracer avid including: -Posteromedial right upper lobe nodule measures 5 mm with SUV max of 4.89, image 81/4. -Nodule within the inferior lingula measures 5 mm within SUV max of 5.1, image 94/4. -Nodule within the medial right apex measures 3 mm with SUV max of 3.18, image 65/4. Incidental CT finding: Aortic atherosclerosis and multi vessel coronary artery calcifications. ABDOMEN/PELVIS Prostate: No focal activity in the prostate bed. Lymph nodes: No abnormal radiotracer accumulation within pelvic or abdominal nodes. Liver: No evidence of liver metastasis. Incidental CT finding: None. SKELETON There is been marked interval progression of tracer avid bone metastases. Innumerable tracer avid bone lesions are now identified compared with solitary tracer avid bone metastases noted on the previous exam. New index lesions include: -FDG avid lesion within the L3  vertebral body with SUV max of 36.52, image 139/4. No corresponding CT changes identified. -Tracer avid bone lesion within the right iliac wing has an SUV max of 34.4, image 165/4. No corresponding CT changes identified. -parasymphyseal lesion within the left pubic bone has an SUV max of 42.15, image 191/4. No corresponding CT changes noted. -Previous tracer avid sclerotic bone lesion involving the right seventh rib has an SUV max of 27.67 on today's study, image 110/4. Previously 27.6 on the previous exam. IMPRESSION: 1. Marked interval progression tracer avid bone metastases. 2. Interval development of multiple tiny lung nodules. Several of these nodules are tracer avid and are worrisome for pulmonary metastases. 3. Aortic Atherosclerosis (ICD10-I70.0). Coronary artery calcifications. Electronically Signed   By: Kerby Moors M.D.   On: 11/23/2021 10:50      HISTORY:   Past Medical History:  Diagnosis Date   Hyperlipidemia    Malignant neoplasm of prostate ([place])    Supraventricular tachycardia [facility])     Past Surgical History:  Procedure Laterality Date   PROSTATECTOMY  07/2014   TONSILLECTOMY      Family History  Problem Relation Age of Onset   Breast cancer Mother 33   Ovarian cancer Mother 24   Breast cancer Paternal Grandmother 51    Social History:  reports that he has never smoked. He has never used smokeless tobacco. He reports that he does not currently use alcohol. He reports that he does not use drugs.The patient is alone today.  Allergies:  Allergies  Allergen Reactions   Atorvastatin    Rosuvastatin Other (See Comments)    Other reaction(s): Muscle pain   Simvastatin Other (See Comments)    Other reaction(s): Joint pain, Muscle pain   Sulfa Antibiotics    Sulfamethoxazole-Trimethoprim Other (See Comments)    Unknown as  to interaction was a baby.    Current Medications: Current Outpatient Medications  Medication Sig Dispense Refill   Alirocumab (PRALUENT [place])  Inject into the skin. One injection every 2 weeks     aspirin 81 MG EC tablet Take 81 mg by mouth daily.     Cholecalciferol 25 MCG (1000 UT) tablet Take by mouth.     clopidogrel (PLAVIX) 75 MG tablet Take 75 mg by mouth daily.     dexamethasone (DECADRON) 4 MG tablet Take 2 tabs by mouth 2 times daily starting day before chemo. Then take 2 tabs daily for 2 days starting day after chemo. Take with food. 30 tablet 1   dexamethasone (DECADRON) 4 MG tablet TAKE TWO TABLETS BY MOUTH TWICE A DAY (START THE DAY BEFORE CHEMO THEN TAKE DAILY FOR 2 DAYS STARTING THE DAY AFTER CHEMO)     diphenhydrAMINE (SOMINEX) 25 MG tablet Take 50 mg by mouth 2 (two) times daily.     ezetimibe (ZETIA) 10 MG tablet Take by mouth.     isosorbide mononitrate (IMDUR) 30 MG 24 hr tablet Take 1 tablet by mouth every 8 (eight) hours as needed.     leuprolide (LUPRON) 22.5 MG injection Inject 22.5 mg into the muscle every 3 (three) months.     metoprolol tartrate (LOPRESSOR) 25 MG tablet TAKE ONE-HALF TABLET BY MOUTH TWICE A DAY FOR HEART     morphine (MS CONTIN) 15 MG 12 hr tablet Take 1 tablet (15 mg total) by mouth every 12 (twelve) hours. 60 tablet 0   Multiple Vitamins-Minerals (MULTIVITAMIN WITH MINERALS) tablet Take 1 tablet by mouth daily.     nitroGLYCERIN (NITROSTAT) 0.4 MG SL tablet DISSOLVE ONE TABLET UNDER THE TONGUE EVERY 5 MINUTES AS NEEDED FOR CHEST PAIN (REPEAT EVERY 5 MINUTES IF NEEDED FOR A TOTAL OF 3 TABLETS IN 15 MINUTES. IF NO RELIEF, CALL 911.)     Omega-3 Fatty Acids (FISH OIL) 1000 MG CAPS Take by mouth.     ondansetron (ZOFRAN) 8 MG tablet Take 1 tablet (8 mg total) by mouth every 8 (eight) hours as needed for nausea or vomiting. 30 tablet 1   predniSONE (DELTASONE) 5 MG tablet Take 1 tablet (5 mg total) by mouth in the morning and at bedtime. 60 tablet 11   prochlorperazine (COMPAZINE) 10 MG tablet Take 1 tablet (10 mg total) by mouth every 6 (six) hours as needed for nausea or vomiting. 30 tablet 1    Royal Jelly-Bee Pollen-Ginseng (KOREAN GINSENG COMPLEX) 150-250-50 MG CAPS Take by mouth.     senna (SENOKOT) 8.6 MG TABS tablet Take 1 tablet by mouth.     Zoledronic Acid (ZOMETA) 4 MG/100ML IVPB Inject 4 mg into the vein.     No current facility-administered medications for this visit.

## 2021-11-26 ENCOUNTER — Other Ambulatory Visit: Payer: Self-pay

## 2021-11-26 ENCOUNTER — Other Ambulatory Visit: Payer: Self-pay | Admitting: Hematology and Oncology

## 2021-11-26 ENCOUNTER — Encounter: Payer: Self-pay | Admitting: Oncology

## 2021-11-26 DIAGNOSIS — C7951 Secondary malignant neoplasm of bone: Secondary | ICD-10-CM

## 2021-11-26 DIAGNOSIS — C61 Malignant neoplasm of prostate: Secondary | ICD-10-CM

## 2021-11-26 MED ORDER — PROCHLORPERAZINE MALEATE 10 MG PO TABS: 10.0000 mg | ORAL_TABLET | Freq: Four times a day (QID) | ORAL | 1 refills | Status: DC | PRN

## 2021-11-26 MED ORDER — ONDANSETRON HCL 8 MG PO TABS: 8.0000 mg | ORAL_TABLET | Freq: Three times a day (TID) | ORAL | 1 refills | Status: DC | PRN

## 2021-11-26 MED ORDER — PREDNISONE 5 MG PO TABS: 5.0000 mg | ORAL_TABLET | Freq: Two times a day (BID) | ORAL | 11 refills | Status: DC

## 2021-11-26 MED ORDER — DEXAMETHASONE 4 MG PO TABS: ORAL_TABLET | ORAL | 1 refills | Status: DC

## 2021-11-26 MED ORDER — MORPHINE SULFATE ER 15 MG PO TBCR: 15.0000 mg | EXTENDED_RELEASE_TABLET | Freq: Two times a day (BID) | ORAL | 0 refills | Status: DC

## 2021-11-27 ENCOUNTER — Other Ambulatory Visit: Payer: Self-pay | Admitting: Pharmacist

## 2021-11-27 ENCOUNTER — Ambulatory Visit: Payer: Non-veteran care

## 2021-11-27 DIAGNOSIS — C61 Malignant neoplasm of prostate: Secondary | ICD-10-CM

## 2021-11-27 DIAGNOSIS — C7951 Secondary malignant neoplasm of bone: Secondary | ICD-10-CM

## 2021-11-28 ENCOUNTER — Other Ambulatory Visit: Payer: Self-pay

## 2021-11-30 MED FILL — Dexamethasone Sodium Phosphate Inj 100 MG/10ML: INTRAMUSCULAR | Qty: 1 | Status: AC

## 2021-11-30 MED FILL — Docetaxel Soln for IV Infusion 160 MG/16ML: INTRAVENOUS | Qty: 12 | Status: AC

## 2021-12-01 ENCOUNTER — Inpatient Hospital Stay: Payer: No Typology Code available for payment source

## 2021-12-01 VITALS — BP 159/69 | HR 54 | Temp 98.1°F | Resp 18 | Ht 65.5 in | Wt 190.0 lb

## 2021-12-01 DIAGNOSIS — C61 Malignant neoplasm of prostate: Secondary | ICD-10-CM

## 2021-12-01 DIAGNOSIS — C7951 Secondary malignant neoplasm of bone: Secondary | ICD-10-CM

## 2021-12-01 DIAGNOSIS — Z5111 Encounter for antineoplastic chemotherapy: Secondary | ICD-10-CM | POA: Diagnosis not present

## 2021-12-01 MED ORDER — SODIUM CHLORIDE 0.9 % IV SOLN
10.0000 mg | Freq: Once | INTRAVENOUS | Status: AC
  Administered 2021-12-01: 10 mg via INTRAVENOUS
  Filled 2021-12-01: qty 10

## 2021-12-01 MED ORDER — SODIUM CHLORIDE 0.9 % IV SOLN
60.0000 mg/m2 | Freq: Once | INTRAVENOUS | Status: AC
  Administered 2021-12-01: 120 mg via INTRAVENOUS
  Filled 2021-12-01: qty 12

## 2021-12-01 MED ORDER — HEPARIN SOD (PORK) LOCK FLUSH 100 UNIT/ML IV SOLN
500.0000 [IU] | Freq: Once | INTRAVENOUS | Status: AC | PRN
  Administered 2021-12-01: 500 [IU]

## 2021-12-01 MED ORDER — SODIUM CHLORIDE 0.9 % IV SOLN: Freq: Once | INTRAVENOUS | Status: AC

## 2021-12-01 MED ORDER — SODIUM CHLORIDE 0.9% FLUSH
10.0000 mL | INTRAVENOUS | Status: DC | PRN
  Administered 2021-12-01: 10 mL

## 2021-12-01 NOTE — Patient Instructions (Signed)
Docetaxel Injection What is this medication? DOCETAXEL (doe se TAX el) treats some types of cancer. It works by slowing down the growth of cancer cells. This medicine may be used for other purposes; ask your health care provider or pharmacist if you have questions. COMMON BRAND NAME(S): Docefrez, Taxotere What should I tell my care team before I take this medication? They need to know if you have any of these conditions: Kidney disease Liver disease Low white blood cell levels Tingling of the fingers or toes or other nerve disorder An unusual or allergic reaction to docetaxel, polysorbate 80, other medications, foods, dyes, or preservatives Pregnant or trying to get pregnant Breast-feeding How should I use this medication? This medication is injected into a vein. It is given by your care team in a hospital or clinic setting. Talk to your care team about the use of this medication in children. Special care may be needed. Overdosage: If you think you have taken too much of this medicine contact a poison control center or emergency room at once. NOTE: This medicine is only for you. Do not share this medicine with others. What if I miss a dose? Keep appointments for follow-up doses. It is important not to miss your dose. Call your care team if you are unable to keep an appointment. What may interact with this medication? Do not take this medication with any of the following: Live virus vaccines This medication may also interact with the following: Certain antibiotics, such as clarithromycin, telithromycin Certain antivirals for HIV or hepatitis Certain medications for fungal infections, such as itraconazole, ketoconazole, voriconazole Grapefruit juice Nefazodone Supplements, such as St. John's wort This list may not describe all possible interactions. Give your health care provider a list of all the medicines, herbs, non-prescription drugs, or dietary supplements you use. Also tell them if  you smoke, drink alcohol, or use illegal drugs. Some items may interact with your medicine. What should I watch for while using this medication? This medication may make you feel generally unwell. This is not uncommon as chemotherapy can affect healthy cells as well as cancer cells. Report any side effects. Continue your course of treatment even though you feel ill unless your care team tells you to stop. You may need blood work done while you are taking this medication. This medication can cause serious side effects and infusion reactions. To reduce the risk, your care team may give you other medications to take before receiving this one. Be sure to follow the directions from your care team. This medication may increase your risk of getting an infection. Call your care team for advice if you get a fever, chills, sore throat, or other symptoms of a cold or flu. Do not treat yourself. Try to avoid being around people who are sick. Avoid taking medications that contain aspirin, acetaminophen, ibuprofen, naproxen, or ketoprofen unless instructed by your care team. These medications may hide a fever. Be careful brushing or flossing your teeth or using a toothpick because you may get an infection or bleed more easily. If you have any dental work done, tell your dentist you are receiving this medication. Some products may contain alcohol. Ask your care team if this medication contains alcohol. Be sure to tell all care teams you are taking this medicine. Certain medications, like metronidazole and disulfiram, can cause an unpleasant reaction when taken with alcohol. The reaction includes flushing, headache, nausea, vomiting, sweating, and increased thirst. The reaction can last from 30 minutes to several hours.  This medication may affect your coordination, reaction time, or judgement. Do not drive or operate machinery until you know how this medication affects you. Sit up or stand slowly to reduce the risk of  dizzy or fainting spells. Drinking alcohol with this medication can increase the risk of these side effects. Talk to your care team about your risk of cancer. You may be more at risk for certain types of cancer if you take this medication. Talk to your care team if you wish to become pregnant or think you might be pregnant. This medication can cause serious birth defects if taken during pregnancy or if you get pregnant within 2 months after stopping therapy. A negative pregnancy test is required before starting this medication. A reliable form of contraception is recommended while taking this medication and for 2 months after stopping it. Talk to your care team about reliable forms of contraception. Do not breast-feed while taking this medication and for 1 week after stopping therapy. Use a condom during sex and for 4 months after stopping therapy. Tell your care team right away if you think your partner might be pregnant. This medication can cause serious birth defects. This medication may cause infertility. Talk to your care team if you are concerned about your fertility. What side effects may I notice from receiving this medication? Side effects that you should report to your care team as soon as possible: Allergic reactions--skin rash, itching, hives, swelling of the face, lips, tongue, or throat Change in vision such as blurry vision, seeing halos around lights, vision loss Infection--fever, chills, cough, or sore throat Infusion reactions--chest pain, shortness of breath or trouble breathing, feeling faint or lightheaded Low red blood cell level--unusual weakness or fatigue, dizziness, headache, trouble breathing Pain, tingling, or numbness in the hands or feet Painful swelling, warmth, or redness of the skin, blisters or sores at the infusion site Redness, blistering, peeling, or loosening of the skin, including inside the mouth Sudden or severe stomach pain, bloody diarrhea, fever, nausea,  vomiting Swelling of the ankles, hands, or feet Tumor lysis syndrome (TLS)--nausea, vomiting, diarrhea, decrease in the amount of urine, dark urine, unusual weakness or fatigue, confusion, muscle pain or cramps, fast or irregular heartbeat, joint pain Unusual bruising or bleeding Side effects that usually do not require medical attention (report to your care team if they continue or are bothersome): Change in nail shape, thickness, or color Change in taste Hair loss Increased tears This list may not describe all possible side effects. Call your doctor for medical advice about side effects. You may report side effects to FDA at 1-800-FDA-1088. Where should I keep my medication? This medication is given in a hospital or clinic. It will not be stored at home. NOTE: This sheet is a summary. It may not cover all possible information. If you have questions about this medicine, talk to your doctor, pharmacist, or health care provider.  2023 [employer] (2021-05-01 00:00:00)

## 2021-12-02 ENCOUNTER — Telehealth: Payer: Self-pay

## 2021-12-02 NOTE — Telephone Encounter (Signed)
I spoke with Marvin Johnson to see how he is doing since first Taxotere infusion. Pt states, "I'm doing fine. No side effects @ all".

## 2021-12-03 ENCOUNTER — Inpatient Hospital Stay: Payer: No Typology Code available for payment source

## 2021-12-03 VITALS — BP 122/67 | HR 59 | Temp 98.0°F | Resp 18 | Ht 65.5 in | Wt 191.0 lb

## 2021-12-03 DIAGNOSIS — C61 Malignant neoplasm of prostate: Secondary | ICD-10-CM

## 2021-12-03 DIAGNOSIS — Z5111 Encounter for antineoplastic chemotherapy: Secondary | ICD-10-CM | POA: Diagnosis not present

## 2021-12-03 DIAGNOSIS — C7951 Secondary malignant neoplasm of bone: Secondary | ICD-10-CM

## 2021-12-03 MED ORDER — PEGFILGRASTIM-CBQV 6 MG/0.6ML ~~LOC~~ SOSY
6.0000 mg | PREFILLED_SYRINGE | Freq: Once | SUBCUTANEOUS | Status: AC
  Administered 2021-12-03: 6 mg via SUBCUTANEOUS
  Filled 2021-12-03: qty 0.6

## 2021-12-03 NOTE — Patient Instructions (Signed)

## 2021-12-08 ENCOUNTER — Telehealth: Payer: Self-pay | Admitting: Hematology and Oncology

## 2021-12-08 NOTE — Telephone Encounter (Signed)
Elta Guadeloupe called to report fatigue, continued low grade headache, as well as intermittent generalized pain, which was severe last night. Using Tylenol prn. The patient is only taking MS Contin at bedtime, so recommended increasing to q12hrs. OK to continue Tylenol prn. Call if pain not controlled or other issues. Elta Guadeloupe expresses understanding.

## 2021-12-11 ENCOUNTER — Inpatient Hospital Stay (INDEPENDENT_AMBULATORY_CARE_PROVIDER_SITE_OTHER): Payer: No Typology Code available for payment source | Admitting: Oncology

## 2021-12-11 ENCOUNTER — Inpatient Hospital Stay: Payer: No Typology Code available for payment source

## 2021-12-11 ENCOUNTER — Encounter: Payer: Self-pay | Admitting: Oncology

## 2021-12-11 VITALS — BP 148/68 | HR 66 | Temp 98.0°F | Resp 18 | Ht 65.5 in | Wt 189.7 lb

## 2021-12-11 DIAGNOSIS — C7800 Secondary malignant neoplasm of unspecified lung: Secondary | ICD-10-CM

## 2021-12-11 DIAGNOSIS — C61 Malignant neoplasm of prostate: Secondary | ICD-10-CM

## 2021-12-11 DIAGNOSIS — C7951 Secondary malignant neoplasm of bone: Secondary | ICD-10-CM

## 2021-12-11 DIAGNOSIS — Z5111 Encounter for antineoplastic chemotherapy: Secondary | ICD-10-CM | POA: Diagnosis not present

## 2021-12-11 DIAGNOSIS — C7952 Secondary malignant neoplasm of bone marrow: Secondary | ICD-10-CM

## 2021-12-11 DIAGNOSIS — D649 Anemia, unspecified: Secondary | ICD-10-CM | POA: Insufficient documentation

## 2021-12-11 LAB — CBC AND DIFFERENTIAL
HCT: 39 — AB (ref 41–53)
Hemoglobin: 12.5 — AB (ref 13.5–17.5)
Neutrophils Absolute: 35.62
Platelets: 196 10*3/uL (ref 150–400)
WBC: 41.9

## 2021-12-11 LAB — HEPATIC FUNCTION PANEL
ALT: 29 U/L (ref 10–40)
AST: 39 (ref 14–40)
Alkaline Phosphatase: 127 — AB (ref 25–125)
Bilirubin, Total: 0.3

## 2021-12-11 LAB — PSA: Prostatic Specific Antigen: 482.63 ng/mL — ABNORMAL HIGH (ref 0.00–4.00)

## 2021-12-11 LAB — COMPREHENSIVE METABOLIC PANEL
Albumin: 4.2 (ref 3.5–5.0)
Calcium: 9.6 (ref 8.7–10.7)

## 2021-12-11 LAB — BASIC METABOLIC PANEL
BUN: 20 (ref 4–21)
CO2: 28 — AB (ref 13–22)
Chloride: 102 (ref 99–108)
Creatinine: 0.7 (ref 0.6–1.3)
Glucose: 127
Potassium: 4.1 mEq/L (ref 3.5–5.1)
Sodium: 137 (ref 137–147)

## 2021-12-11 LAB — CBC: RBC: 4.42 (ref 3.87–5.11)

## 2021-12-11 NOTE — Progress Notes (Deleted)
[name]  [address] 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 He continues enzalutamide 160 mg daily, as well as leuprolide 22.5 mg zoledronic acid every 3 months.  His pain is controlled with MS Contin 15 mg twice daily with occasional Tylenol for breakthrough.   He has been on enzalutamide since June 2018.  He then had a teadily rising PSA, up to 83.49 in November 2022.  PSMA PET in October 2022 revealed an oligometastasis skeletal lesion it in the anterior right 7th rib, with increased activity, and very subtle sclerotic change at T10, but no evidence of progressive disease. We therefore have continued the same treatment.   The PSA has continued to fluctuate up and down.  It was down to 31 in February then back up to 37 in May.  As he he remains asymptomatic, and the PSA is in stable range, we will not plan routine imaging.  He no to continue enzalutamide daily.  He will proceed with leuprolide/zoledronic acid next week..  We will plan to see him back in 3 months prior to his next leuprolide/zoledronic acid.   Secondary malignant neoplasm of bone and bone marrow ([name]) Hel continues zoledronic acid every 3 months in addition to his cancer therapy.        The patient understands the plans discussed today and is in agreement with them.  He knows to contact our office if he develops concerns prior to his next appointment.   I provided 15 minutes of face-to-face time during this encounter and > 50% was spent counseling as documented under my assessment and plan.    Marvin Kaplan, MD  [facility] [address] [place] [address] Dept: [phone] Dept Fax:  [phone]   No orders of the defined types were placed in this encounter.     CHIEF COMPLAINT:  CC: Metastatic prostate cancer to bone  Current Treatment: Leuprolide/enzalutamide/zoledronic acid  HISTORY OF PRESENT ILLNESS:  Marvin Johnson is a 71 year old male with a history of stage IV (T3b N1 M0) prostate cancer diagnosed in January 2016 when he was found to have a PSA of 51.8.  He was treated with a laparoscopic prostatectomy in May 2016.  Pathology revealed adenocarcinoma with a Gleason 8.  There was a positive posterior margin with extraprostatic extension, as well as left retro-urethral tissue positive and extension to the seminal vesicles bilaterally with multiple positive margins.  Two lymph nodes from the right side were negative, but 2/4 lymph nodes from the left side were positive for metastasis. Repeat bone scan in June 2016 revealed a new lesion at T10, which was not present on his baseline bone scan.  PSA at Dr. [name] office in early August 2016 remained elevated at 56.4.    We began seeing him in August 2016. The PSA was up to 216.3 in late August 2016, so hormonal therapy was recommended  X-rays of the thoracic spine revealed osteoarthritis and scoliosis, as well as evidence of demineralization, but we could not visualize a metastatic lesion.[name] He started leuprolide in September 2016.  The PSA initially dropped steadily with leuprolide.  Bone density scan in November 2016 revealed osteopenia, with a T-score of -2.1 in the spine and a  T-score of -0.9 in the femur.  He had been taking vitamin-D 1000 international units daily, but not calcium, so calcium 600 mg twice daily was added at that time.    Leuprolide was held in February 2017 because of chest pain.  EKG was unremarkable.  He went to the [place] and he was referred to the [facility] for placement of 2 coronary artery stents for 95% and 99% occlusions.  He then had a third coronary artery stent placed later in the summer.   Leuprolide was resumed in March 2017.  In September 2017, he had an increase in the PSA, so we repeated a bone scan.  This revealed intense uptake within the right posterior elements of T10, but no other areas of metastasis.  We then added bicalutamide 50 mg daily to his leuprolide.     He had a steadily increasing PSA despite the bicalutamide, so this was discontinued in March 2018, in hopes that he would respond to withdrawal of the drug. He developed new mild to moderate pain in the right mid back.  He eventually was placed on MS Contin 15 mg twice daily, with oxycodone 10 mg every 4 hours as needed for breakthrough pain.  MRI thoracic spine in April 2018 revealed increasing tumor at T10 with epidural spread.  Bone scan at that time revealed increased activity in the T10 lesion, which was stable.  There was a questionable tiny focus in the right ischium/acetabular rim.  Plain x-rays of the bilateral hips and pelvis did not reveal any focal abnormality. The PSA increased from 27.5 in March, to 72.2 in May, then to 92.2 in June 2018.  He was referred for radiotherapy to the T10 lesion due to the severe pain with improvement in his pain.  He was started on zoledronic acid along with his leuprolide in June 2018.  He had a Port-A-Cath placed in anticipation of chemotherapy, but then we recommended that he be placed on enzalutamide 160 mg daily instead, in order to delay the time to initiation of chemotherapy.  He started enzalutamide in June 2018.  The PSA became undetectable in October 2018.  We continued MS Contin, but discontinued the oxycodone. He did not tolerate meloxicam, so uses Tylenol as needed for breakthrough pain.     He had a stress test in January 2020 and was referred to [facility] for coronary artery stenting, which was done in February.  Zoledronic acid had been giving monthly for over 2 years, so was changed to every 3 months in October 2020.   The PSA started to increase in  January 2021 at which time it was 0.2.  CT chest, abdomen and pelvis in March 2021 not reveal any lymphadenopathy or soft tissue metastatic disease.  Hepatic steatosis was seen.  There was a stable sclerotic osseous lesion of the T10 vertebral body, as well as a subtle sclerotic lesion of the L3 vertebral body, also possibly a small metastatic lesion.  Whole body bone scan did not reveal continued increased activity of T10 and right acetabulum posteriorly. There was new uptake at the anterior right third, fourth, and fifth ribs, the adjacent nature of which suggests a traumatic etiology.  No additional scintigraphic abnormalities.  He reported previous injury of his upper right chest with occasional pain of this area.  The PSA was 0.5 in March.  PSA had increased to 3.0 in June.  [facility]umin PET imaging from July did not reveal any evidence of active prostate carcinoma, local  recurrence, or metastatic disease. The PSA continued to slowly rise and was up to 5.9 in September, 10.9 in October, and 16.3 in December.  He had a mammogram in December 2021 to evaluate nipple tenderness, which revealed bilateral gynecomastia without any evidence of malignancy.     The PSA went up to 23.81 in March 2022, so an [place] PET scan was ordered which revealed an oligometastasis skeletal lesion it in the anterior right 7th rib with very subtle sclerotic change at this level.  There was no evidence of local recurrence in the prostatectomy bed and no evidence of metastatic lymphadenopathy or visceral metastasis. PSA in June decreased to 18.6, but then in September increased to 58.  In view of the significant rise in his PSA, a prostate specific PET scan was ordered.  PSMA PET in October 2022 revealed a persistent focus of intense radiotracer activity in the anterior RIGHT seventh rib with increased in activity from comparison exam and subtle sclerotic changes on CT. Findings remain consistent with oligometastatic skeletal metastasis.  Sclerotic lesions in the T10 vertebral body without radiotracer activity potentially represented prior treated prostate cancer metastasis.  There is no evidence of hypermetabolic lymphadenopathy or lesions within the liver.  We recommended he continue his current treatment with enzalutamide, leuprolide and zoledronic acid.  PSA in November continued to increase to 83.49.  Even though his PSA continued to steadily increase, we did not recommend a change in therapy based on this alone. PSMA PET imaging from October revealed no evidence of progressive disease.  He has a good quality of life.  The PSA has continued to fluctuate up and down and was 31 in February and 78 in May.   Oncology History  Malignant neoplasm of prostate ([place])  03/30/2014 Cancer Staging   Staging form: Prostate, AJCC 8th Edition - Clinical stage from 03/30/2014: Stage IVA (cT3b, cN1, cM0, PSA: 51.8, Grade Group: 4) - Signed by Marvin Kaplan, MD on 11/14/2020 Histopathologic type: Adenocarcinoma, NOS Stage prefix: Initial diagnosis Prostate specific antigen (PSA) range: 20 or greater Gleason primary pattern: 4 Gleason secondary pattern: 4 Gleason score: 8 Histologic grading system: 5 grade system Location of positive needle core biopsies: Beyond primary site Prognostic indicators: May 2016 Robotic prostatectomy with mult. Pos margins, extra prostatic extension, 2/6 pos nodes. Scan June 2016 positive at T10  PSA up to 216.3 by August Placed on lupron and casodex Stage used in treatment planning: Yes National guidelines used in treatment planning: Yes Type of national guideline used in treatment planning: NCCN   02/18/2020 Initial Diagnosis   Malignant neoplasm of prostate ([place])   12/01/2021 -  Chemotherapy   Patient is on Treatment Plan : PROSTATE Docetaxel (75) + Prednisone q21d     Secondary malignant neoplasm of bone and bone marrow ([place])  02/18/2020 Initial Diagnosis   Secondary malignant neoplasm of bone and  bone marrow ([place])   12/01/2021 -  Chemotherapy   Patient is on Treatment Plan : PROSTATE Docetaxel (75) + Prednisone q21d         INTERVAL HISTORY:  Chirstopher is here today for repeat clinical assessment.  He states he continues enzalutamide daily without significant difficulty.  He does note some fatigue.  He denies increasing pain.  He denies new symptoms.  He denies fevers or chills. He denies pain. His appetite is good. His weight has increased 1 pounds over last 3 months .  We initially considered routine imaging prior to this appointment, but decided against it.  REVIEW  OF SYSTEMS:  Review of Systems  Constitutional:  Positive for fatigue (Mild). Negative for appetite change, chills, fever and unexpected weight change.  HENT:   Negative for lump/mass, mouth sores and sore throat.   Respiratory:  Negative for cough and shortness of breath.   Cardiovascular:  Negative for chest pain and leg swelling.  Gastrointestinal:  Negative for abdominal pain, constipation, diarrhea, nausea and vomiting.  Genitourinary:  Negative for difficulty urinating, dysuria, frequency and hematuria.   Musculoskeletal:  Negative for arthralgias, back pain and myalgias.  Skin:  Negative for itching, rash and wound.  Neurological:  Negative for dizziness, extremity weakness, headaches, light-headedness and numbness.  Hematological:  Negative for adenopathy.  Psychiatric/Behavioral:  Negative for depression and sleep disturbance. The patient is not nervous/anxious.      VITALS:  There were no vitals taken for this visit.  Wt Readings from Last 3 Encounters:  12/03/21 191 lb (86.6 kg)  12/01/21 190 lb (86.2 kg)  11/25/21 194 lb 1.6 oz (88 kg)    There is no height or weight on file to calculate BMI.  Performance status (ECOG): 1 - Symptomatic but completely ambulatory  PHYSICAL EXAM:  Physical Exam Vitals and nursing note reviewed.  Constitutional:      General: He is not in acute distress.     Appearance: Normal appearance. He is normal weight.  HENT:     Head: Normocephalic and atraumatic.     Mouth/Throat:     Mouth: Mucous membranes are moist.     Pharynx: Oropharynx is clear. No oropharyngeal exudate or posterior oropharyngeal erythema.  Eyes:     General: No scleral icterus.    Extraocular Movements: Extraocular movements intact.     Conjunctiva/sclera: Conjunctivae normal.     Pupils: Pupils are equal, round, and reactive to light.  Cardiovascular:     Rate and Rhythm: Normal rate and regular rhythm.     Heart sounds: Normal heart sounds. No murmur heard.    No friction rub. No gallop.  Pulmonary:     Effort: Pulmonary effort is normal.     Breath sounds: Normal breath sounds. No wheezing, rhonchi or rales.  Abdominal:     General: Bowel sounds are normal. There is no distension.     Palpations: Abdomen is soft. There is no hepatomegaly, splenomegaly or mass.     Tenderness: There is no abdominal tenderness.  Musculoskeletal:        General: Normal range of motion.     Cervical back: Normal range of motion and neck supple. No tenderness.     Right lower leg: No edema.     Left lower leg: No edema.  Lymphadenopathy:     Cervical: No cervical adenopathy.     Upper Body:     Right upper body: No supraclavicular or axillary adenopathy.     Left upper body: No supraclavicular or axillary adenopathy.     Lower Body: No right inguinal adenopathy. No left inguinal adenopathy.  Skin:    General: Skin is warm and dry.     Coloration: Skin is not jaundiced.     Findings: No rash.  Neurological:     Mental Status: He is alert and oriented to person, place, and time.     Cranial Nerves: No cranial nerve deficit.  Psychiatric:        Mood and Affect: Mood normal.        Behavior: Behavior normal.        Thought Content: Thought  content normal.   LABS:      Latest Ref Rng & Units 10/30/2021   12:00 AM 07/30/2021   12:00 AM 05/07/2021   12:00 AM  CBC  WBC  9.5      7.1     8.3   Hemoglobin 13.5 - 17.5 13.4     13.4     13.4   Hematocrit 41 - 53 40     42     39   Platelets 150 - 400 K/uL 251     226     228      This result is from an external source.       Latest Ref Rng & Units 10/30/2021   12:00 AM 07/30/2021   12:00 AM 05/07/2021   12:00 AM  CMP  BUN 4 - '21 15     18     15   '$ Creatinine 0.6 - 1.3 0.7     0.9     0.7   Sodium 137 - 147 137     141     140   Potassium 3.5 - 5.1 mEq/L 4.1     4.3     3.8   Chloride 99 - 108 106     101     105   CO2 13 - '22 23     26     23   '$ Calcium 8.7 - 10.7 9.6     9.4     9.1   Alkaline Phos 25 - 125 57     55     69   AST 14 - 40 41     29     32   ALT 10 - 40 U/L '23     21     19      '$ This result is from an external source.      No results found for: "CEA1", "CEA" / No results found for: "CEA1", "CEA" Lab Results  Component Value Date   PSA1 334.0 (H) 10/30/2021   No results found for: "XBL390" No results found for: "CAN125"  No results found for: "TOTALPROTELP", "ALBUMINELP", "A1GS", "A2GS", "BETS", "BETA2SER", "GAMS", "MSPIKE", "SPEI" No results found for: "TIBC", "FERRITIN", "IRONPCTSAT" No results found for: "LDH"  STUDIES:  NM PET (PSMA) SKULL TO MID THIGH  Result Date: 11/23/2021 CLINICAL DATA:  Prostate carcinoma with biochemical recurrence. EXAM: NUCLEAR MEDICINE PET SKULL BASE TO THIGH TECHNIQUE: 9.4 mCi F18 Piflufolastat (Pylarify) was injected intravenously. Full-ring PET imaging was performed from the skull base to thigh after the radiotracer. CT data was obtained and used for attenuation correction and anatomic localization. COMPARISON:  12/24/2020 FINDINGS: NECK No radiotracer activity in neck lymph nodes. Incidental CT finding: None. CHEST No tracer avid supraclavicular, axillary, mediastinal, or hilar lymph nodes. Interval development of multiple small tracer pulmonary nodules which are concerning for metastatic disease. These nodules are all very small measuring (no more than 5  mm) and are technically too small to characterize by PET-CT. However a few of these nodules are tracer avid including: -Posteromedial right upper lobe nodule measures 5 mm with SUV max of 4.89, image 81/4. -Nodule within the inferior lingula measures 5 mm within SUV max of 5.1, image 94/4. -Nodule within the medial right apex measures 3 mm with SUV max of 3.18, image 65/4. Incidental CT finding: Aortic atherosclerosis and multi vessel coronary artery calcifications. ABDOMEN/PELVIS Prostate: No focal activity in the prostate bed. Lymph nodes: No abnormal radiotracer  accumulation within pelvic or abdominal nodes. Liver: No evidence of liver metastasis. Incidental CT finding: None. SKELETON There is been marked interval progression of tracer avid bone metastases. Innumerable tracer avid bone lesions are now identified compared with solitary tracer avid bone metastases noted on the previous exam. New index lesions include: -FDG avid lesion within the L3 vertebral body with SUV max of 36.52, image 139/4. No corresponding CT changes identified. -Tracer avid bone lesion within the right iliac wing has an SUV max of 34.4, image 165/4. No corresponding CT changes identified. -parasymphyseal lesion within the left pubic bone has an SUV max of 42.15, image 191/4. No corresponding CT changes noted. -Previous tracer avid sclerotic bone lesion involving the right seventh rib has an SUV max of 27.67 on today's study, image 110/4. Previously 27.6 on the previous exam. IMPRESSION: 1. Marked interval progression tracer avid bone metastases. 2. Interval development of multiple tiny lung nodules. Several of these nodules are tracer avid and are worrisome for pulmonary metastases. 3. Aortic Atherosclerosis (ICD10-I70.0). Coronary artery calcifications. Electronically Signed   By: Kerby Moors M.D.   On: 11/23/2021 10:50      HISTORY:   Past Medical History:  Diagnosis Date  . Hyperlipidemia   . Malignant neoplasm of prostate  ([place])   . Supraventricular tachycardia [facility])     Past Surgical History:  Procedure Laterality Date  . PROSTATECTOMY  07/2014  . TONSILLECTOMY      Family History  Problem Relation Age of Onset  . Breast cancer Mother 11  . Ovarian cancer Mother 85  . Breast cancer Paternal Grandmother 55    Social History:  reports that he has never smoked. He has never used smokeless tobacco. He reports that he does not currently use alcohol. He reports that he does not use drugs.The patient is alone today.  Allergies:  Allergies  Allergen Reactions  . Atorvastatin   . Rosuvastatin Other (See Comments)    Other reaction(s): Muscle pain  . Simvastatin Other (See Comments)    Other reaction(s): Joint pain, Muscle pain  . Sulfa Antibiotics   . Sulfamethoxazole-Trimethoprim Other (See Comments)    Unknown as to interaction was a baby.    Current Medications: Current Outpatient Medications  Medication Sig Dispense Refill  . Alirocumab (PRAL[place]ENT [place]) Inject into the skin. One injection every 2 weeks    . aspirin 81 MG EC tablet Take 81 mg by mouth daily.    . Cholecalciferol 25 MCG (1000 UT) tablet Take by mouth.    . clopidogrel (PLAVIX) 75 MG tablet Take 75 mg by mouth daily.    [name] dexamethasone (DECADRON) 4 MG tablet Take 2 tabs by mouth 2 times daily starting day before chemo. Then take 2 tabs daily for 2 days starting day after chemo. Take with food. 30 tablet 1  . diphenhydrAMINE (SOMINEX) 25 MG tablet Take 50 mg by mouth 2 (two) times daily.    [name] ezetimibe (ZETIA) 10 MG tablet Take by mouth.    . isosorbide mononitrate (IMDUR) 30 MG 24 hr tablet Take 1 tablet by mouth every 8 (eight) hours as needed.    [name] leuprolide (LUPRON) 22.5 MG injection Inject 22.5 mg into the muscle every 3 (three) months.    . metoprolol tartrate (LOPRESSOR) 25 MG tablet TAKE ONE-HALF TABLET BY MOUTH TWICE A DAY FOR HEART    . morphine (MS CONTIN) 15 MG 12 hr tablet Take 1 tablet (15 mg total) by mouth every 12  (twelve) hours. [place]  tablet 0  . Multiple Vitamins-Minerals (MULTIVITAMIN WITH MINERALS) tablet Take 1 tablet by mouth daily.    . nitroGLYCERIN (NITROSTAT) 0.4 MG SL tablet DISSOLVE ONE TABLET UNDER THE TONGUE EVERY 5 MINUTES AS NEEDED FOR CHEST PAIN (REPEAT EVERY 5 MINUTES IF NEEDED FOR A TOTAL OF 3 TABLETS IN 15 MINUTES. IF NO RELIEF, CALL 911.)    . Omega-3 Fatty Acids (FISH OIL) 1000 MG CAPS Take by mouth.    . ondansetron (ZOFRAN) 8 MG tablet Take 1 tablet (8 mg total) by mouth every 8 (eight) hours as needed for nausea or vomiting. 30 tablet 1  . predniSONE (DELTASONE) 5 MG tablet Take 1 tablet (5 mg total) by mouth in the morning and at bedtime. 60 tablet 11  . prochlorperazine (COMPAZINE) 10 MG tablet Take 1 tablet (10 mg total) by mouth every 6 (six) hours as needed for nausea or vomiting. 30 tablet 1  . Royal Jelly-Bee Pollen-Ginseng (KOREAN GINSENG COMPLEX) 150-250-50 MG CAPS Take by mouth.    . senna (SENOKOT) 8.6 MG TABS tablet Take 1 tablet by mouth.    . Zoledronic Acid (ZOMETA) 4 MG/100ML IVPB Inject 4 mg into the vein.     No current facility-administered medications for this visit.

## 2021-12-13 ENCOUNTER — Encounter: Payer: Self-pay | Admitting: Oncology

## 2021-12-17 ENCOUNTER — Telehealth: Payer: Self-pay

## 2021-12-17 NOTE — Progress Notes (Deleted)
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 He continues enzalutamide 160 mg daily, as well as leuprolide 22.5 mg zoledronic acid every 3 months.  His pain is controlled with MS Contin 15 mg twice daily with occasional Tylenol for breakthrough.   He has been on enzalutamide since June 2018.  He then had a teadily rising PSA, up to 83.49 in November 2022.  PSMA PET in October 2022 revealed an oligometastasis skeletal lesion it in the anterior right 7th rib, with increased activity, and very subtle sclerotic change at T10, but no evidence of progressive disease. We therefore have continued the same treatment.   The PSA has continued to fluctuate up and down.  It was down to 31 in February then back up to 62 in May.  As he he remains asymptomatic, and the PSA is in stable range, we will not plan routine imaging.  He no to continue enzalutamide daily.  He will proceed with leuprolide/zoledronic acid next week..  We will plan to see him back in 3 months prior to his next leuprolide/zoledronic acid.   Secondary malignant neoplasm of bone and bone marrow ([place]) Hel continues zoledronic acid every 3 months in addition to his cancer therapy.        The patient understands the plans discussed today and is in agreement with them.  He knows to contact our office if he develops concerns prior to his next appointment.   I provided 15 minutes of face-to-face time during this encounter and > 50% was spent counseling as documented under my assessment and plan.    [name], MD  [facility] [address] [place] [address] Dept: [phone] Dept Fax:  [phone]   No orders of the defined types were placed in this encounter.     CHIEF COMPLAINT:  CC: Metastatic prostate cancer to bone  Current Treatment: Leuprolide/enzalutamide/zoledronic acid  HISTORY OF PRESENT ILLNESS:  Marvin Johnson is a 71 year old male with a history of stage IV (T3b N1 M0) prostate cancer diagnosed in January 2016 when he was found to have a PSA of 51.8.  He was treated with a laparoscopic prostatectomy in May 2016.  Pathology revealed adenocarcinoma with a Gleason 8.  There was a positive posterior margin with extraprostatic extension, as well as left retro-urethral tissue positive and extension to the seminal vesicles bilaterally with multiple positive margins.  Two lymph nodes from the right side were negative, but 2/4 lymph nodes from the left side were positive for metastasis. Repeat bone scan in June 2016 revealed a new lesion at T10, which was not present on his baseline bone scan.  PSA at Dr. [name] office in early August 2016 remained elevated at 56.4.    We began seeing him in August 2016. The PSA was up to 216.3 in late August 2016, so hormonal therapy was recommended  X-rays of the thoracic spine revealed osteoarthritis and scoliosis, as well as evidence of demineralization, but we could not visualize a metastatic lesion.[name] He started leuprolide in September 2016.  The PSA initially dropped steadily with leuprolide.  Bone density scan in November 2016 revealed osteopenia, with a T-score of -2.1 in the spine and  a T-score of -0.9 in the femur.  He had been taking vitamin-D 1000 international units daily, but not calcium, so calcium 600 mg twice daily was added at that time.    Leuprolide was held in February 2017 because of chest pain.  EKG was unremarkable.  He went to the [place] and he was referred to the [facility] for placement of 2 coronary artery stents for 95% and 99% occlusions.  He then had a third coronary artery stent placed later in the summer.   Leuprolide was resumed in March 2017.  In September 2017, he had an increase in the PSA, so we repeated a bone scan.  This revealed intense uptake within the right posterior elements of T10, but no other areas of metastasis.  We then added bicalutamide 50 mg daily to his leuprolide.     He had a steadily increasing PSA despite the bicalutamide, so this was discontinued in March 2018, in hopes that he would respond to withdrawal of the drug. He developed new mild to moderate pain in the right mid back.  He eventually was placed on MS Contin 15 mg twice daily, with oxycodone 10 mg every 4 hours as needed for breakthrough pain.  MRI thoracic spine in April 2018 revealed increasing tumor at T10 with epidural spread.  Bone scan at that time revealed increased activity in the T10 lesion, which was stable.  There was a questionable tiny focus in the right ischium/acetabular rim.  Plain x-rays of the bilateral hips and pelvis did not reveal any focal abnormality. The PSA increased from 27.5 in March, to 72.2 in May, then to 92.2 in June 2018.  He was referred for radiotherapy to the T10 lesion due to the severe pain with improvement in his pain.  He was started on zoledronic acid along with his leuprolide in June 2018.  He had a Port-A-Cath placed in anticipation of chemotherapy, but then we recommended that he be placed on enzalutamide 160 mg daily instead, in order to delay the time to initiation of chemotherapy.  He started enzalutamide in June 2018.  The PSA became undetectable in October 2018.  We continued MS Contin, but discontinued the oxycodone. He did not tolerate meloxicam, so uses Tylenol as needed for breakthrough pain.     He had a stress test in January 2020 and was referred to [facility] for coronary artery stenting, which was done in February.  Zoledronic acid had been giving monthly for over 2 years, so was changed to every 3 months in October 2020.   The PSA started to increase in  January 2021 at which time it was 0.2.  CT chest, abdomen and pelvis in March 2021 not reveal any lymphadenopathy or soft tissue metastatic disease.  Hepatic steatosis was seen.  There was a stable sclerotic osseous lesion of the T10 vertebral body, as well as a subtle sclerotic lesion of the L3 vertebral body, also possibly a small metastatic lesion.  Whole body bone scan did not reveal continued increased activity of T10 and right acetabulum posteriorly. There was new uptake at the anterior right third, fourth, and fifth ribs, the adjacent nature of which suggests a traumatic etiology.  No additional scintigraphic abnormalities.  He reported previous injury of his upper right chest with occasional pain of this area.  The PSA was 0.5 in March.  PSA had increased to 3.0 in June.  [facility]umin PET imaging from July did not reveal any evidence of active prostate carcinoma,  local recurrence, or metastatic disease. The PSA continued to slowly rise and was up to 5.9 in September, 10.9 in October, and 16.3 in December.  He had a mammogram in December 2021 to evaluate nipple tenderness, which revealed bilateral gynecomastia without any evidence of malignancy.     The PSA went up to 23.81 in March 2022, so an [place] PET scan was ordered which revealed an oligometastasis skeletal lesion it in the anterior right 7th rib with very subtle sclerotic change at this level.  There was no evidence of local recurrence in the prostatectomy bed and no evidence of metastatic lymphadenopathy or visceral metastasis. PSA in June decreased to 18.6, but then in September increased to 58.  In view of the significant rise in his PSA, a prostate specific PET scan was ordered.  PSMA PET in October 2022 revealed a persistent focus of intense radiotracer activity in the anterior RIGHT seventh rib with increased in activity from comparison exam and subtle sclerotic changes on CT. Findings remain consistent with oligometastatic skeletal metastasis.  Sclerotic lesions in the T10 vertebral body without radiotracer activity potentially represented prior treated prostate cancer metastasis.  There is no evidence of hypermetabolic lymphadenopathy or lesions within the liver.  We recommended he continue his current treatment with enzalutamide, leuprolide and zoledronic acid.  PSA in November continued to increase to 83.49.  Even though his PSA continued to steadily increase, we did not recommend a change in therapy based on this alone. PSMA PET imaging from October revealed no evidence of progressive disease.  He has a good quality of life.  The PSA has continued to fluctuate up and down and was 31 in February and 78 in May.   Oncology History  Malignant neoplasm of prostate ([place])  03/30/2014 Cancer Staging   Staging form: Prostate, AJCC 8th Edition - Clinical stage from 03/30/2014: Stage IVA (cT3b, cN1, cM0, PSA: 51.8, Grade Group: 4) - Signed by [name], MD on 11/14/2020 Histopathologic type: Adenocarcinoma, NOS Stage prefix: Initial diagnosis Prostate specific antigen (PSA) range: 20 or greater Gleason primary pattern: 4 Gleason secondary pattern: 4 Gleason score: 8 Histologic grading system: 5 grade system Location of positive needle core biopsies: Beyond primary site Prognostic indicators: May 2016 Robotic prostatectomy with mult. Pos margins, extra prostatic extension, 2/6 pos nodes. Scan June 2016 positive at T10  PSA up to 216.3 by August Placed on lupron and casodex Stage used in treatment planning: Yes National guidelines used in treatment planning: Yes Type of national guideline used in treatment planning: NCCN   02/18/2020 Initial Diagnosis   Malignant neoplasm of prostate ([place])   12/01/2021 -  Chemotherapy   Patient is on Treatment Plan : PROSTATE Docetaxel (75) + Prednisone q21d     Secondary malignant neoplasm of bone and bone marrow ([place])  02/18/2020 Initial Diagnosis   Secondary malignant neoplasm of bone and  bone marrow ([place])   12/01/2021 -  Chemotherapy   Patient is on Treatment Plan : PROSTATE Docetaxel (75) + Prednisone q21d         INTERVAL HISTORY:  Avari is here today for repeat clinical assessment.  He states he continues enzalutamide daily without significant difficulty.  He does note some fatigue.  He denies increasing pain.  He denies new symptoms.  He denies fevers or chills. He denies pain. His appetite is good. His weight has increased 1 pounds over last 3 months .  We initially considered routine imaging prior to this appointment, but decided against it.  REVIEW OF SYSTEMS:  Review of Systems  Constitutional:  Positive for fatigue (Mild). Negative for appetite change, chills, fever and unexpected weight change.  HENT:   Negative for lump/mass, mouth sores and sore throat.   Respiratory:  Negative for cough and shortness of breath.   Cardiovascular:  Negative for chest pain and leg swelling.  Gastrointestinal:  Negative for abdominal pain, constipation, diarrhea, nausea and vomiting.  Genitourinary:  Negative for difficulty urinating, dysuria, frequency and hematuria.   Musculoskeletal:  Negative for arthralgias, back pain and myalgias.  Skin:  Negative for itching, rash and wound.  Neurological:  Negative for dizziness, extremity weakness, headaches, light-headedness and numbness.  Hematological:  Negative for adenopathy.  Psychiatric/Behavioral:  Negative for depression and sleep disturbance. The patient is not nervous/anxious.      VITALS:  There were no vitals taken for this visit.  Wt Readings from Last 3 Encounters:  12/11/21 189 lb 11.2 oz (86 kg)  12/03/21 191 lb (86.6 kg)  12/01/21 190 lb (86.2 kg)    There is no height or weight on file to calculate BMI.  Performance status (ECOG): 1 - Symptomatic but completely ambulatory  PHYSICAL EXAM:  Physical Exam Vitals and nursing note reviewed.  Constitutional:      General: He is not in acute distress.     Appearance: Normal appearance. He is normal weight.  HENT:     Head: Normocephalic and atraumatic.     Mouth/Throat:     Mouth: Mucous membranes are moist.     Pharynx: Oropharynx is clear. No oropharyngeal exudate or posterior oropharyngeal erythema.  Eyes:     General: No scleral icterus.    Extraocular Movements: Extraocular movements intact.     Conjunctiva/sclera: Conjunctivae normal.     Pupils: Pupils are equal, round, and reactive to light.  Cardiovascular:     Rate and Rhythm: Normal rate and regular rhythm.     Heart sounds: Normal heart sounds. No murmur heard.    No friction rub. No gallop.  Pulmonary:     Effort: Pulmonary effort is normal.     Breath sounds: Normal breath sounds. No wheezing, rhonchi or rales.  Abdominal:     General: Bowel sounds are normal. There is no distension.     Palpations: Abdomen is soft. There is no hepatomegaly, splenomegaly or mass.     Tenderness: There is no abdominal tenderness.  Musculoskeletal:        General: Normal range of motion.     Cervical back: Normal range of motion and neck supple. No tenderness.     Right lower leg: No edema.     Left lower leg: No edema.  Lymphadenopathy:     Cervical: No cervical adenopathy.     Upper Body:     Right upper body: No supraclavicular or axillary adenopathy.     Left upper body: No supraclavicular or axillary adenopathy.     Lower Body: No right inguinal adenopathy. No left inguinal adenopathy.  Skin:    General: Skin is warm and dry.     Coloration: Skin is not jaundiced.     Findings: No rash.  Neurological:     Mental Status: He is alert and oriented to person, place, and time.     Cranial Nerves: No cranial nerve deficit.  Psychiatric:        Mood and Affect: Mood normal.        Behavior: Behavior normal.        Thought Content:  Thought content normal.   LABS:      Latest Ref Rng & Units 12/11/2021   12:00 AM 11/25/2021   12:00 AM 10/30/2021   12:00 AM  CBC  WBC  41.9      7.9     9.5      Hemoglobin 13.5 - 17.5 12.5     13.6     13.4      Hematocrit 41 - 53 39     40     40      Platelets 150 - 400 K/uL 196     275     251         This result is from an external source.       Latest Ref Rng & Units 12/11/2021   12:00 AM 11/25/2021   12:00 AM 10/30/2021   12:00 AM  CMP  BUN 4 - '21 20     15     15      '$ Creatinine 0.6 - 1.3 0.7     0.6     0.7      Sodium 137 - 147 137     138     137      Potassium 3.5 - 5.1 mEq/L 4.1     3.5     4.1      Chloride 99 - 108 102     105     106      CO2 13 - '22 28     23     23      '$ Calcium 8.7 - 10.7 9.6     9.9     9.6      Alkaline Phos 25 - 125 127     56     57      AST 14 - 40 39     29     41      ALT 10 - 40 U/L '29     22     23         '$ This result is from an external source.      No results found for: "CEA1", "CEA" / No results found for: "CEA1", "CEA" Lab Results  Component Value Date   PSA1 334.0 (H) 10/30/2021   No results found for: "TTS177" No results found for: "CAN125"  No results found for: "TOTALPROTELP", "ALBUMINELP", "A1GS", "A2GS", "BETS", "BETA2SER", "GAMS", "MSPIKE", "SPEI" No results found for: "TIBC", "FERRITIN", "IRONPCTSAT" No results found for: "LDH"  STUDIES:  NM PET (PSMA) SKULL TO MID THIGH  Result Date: 11/23/2021 CLINICAL DATA:  Prostate carcinoma with biochemical recurrence. EXAM: NUCLEAR MEDICINE PET SKULL BASE TO THIGH TECHNIQUE: 9.4 mCi F18 Piflufolastat (Pylarify) was injected intravenously. Full-ring PET imaging was performed from the skull base to thigh after the radiotracer. CT data was obtained and used for attenuation correction and anatomic localization. COMPARISON:  12/24/2020 FINDINGS: NECK No radiotracer activity in neck lymph nodes. Incidental CT finding: None. CHEST No tracer avid supraclavicular, axillary, mediastinal, or hilar lymph nodes. Interval development of multiple small tracer pulmonary nodules which are concerning for metastatic disease. These nodules  are all very small measuring (no more than 5 mm) and are technically too small to characterize by PET-CT. However a few of these nodules are tracer avid including: -Posteromedial right upper lobe nodule measures 5 mm with SUV max of 4.89, image 81/4. -Nodule within the inferior lingula measures 5 mm within SUV max of 5.1,  image 94/4. -Nodule within the medial right apex measures 3 mm with SUV max of 3.18, image 65/4. Incidental CT finding: Aortic atherosclerosis and multi vessel coronary artery calcifications. ABDOMEN/PELVIS Prostate: No focal activity in the prostate bed. Lymph nodes: No abnormal radiotracer accumulation within pelvic or abdominal nodes. Liver: No evidence of liver metastasis. Incidental CT finding: None. SKELETON There is been marked interval progression of tracer avid bone metastases. Innumerable tracer avid bone lesions are now identified compared with solitary tracer avid bone metastases noted on the previous exam. New index lesions include: -FDG avid lesion within the L3 vertebral body with SUV max of 36.52, image 139/4. No corresponding CT changes identified. -Tracer avid bone lesion within the right iliac wing has an SUV max of 34.4, image 165/4. No corresponding CT changes identified. -parasymphyseal lesion within the left pubic bone has an SUV max of 42.15, image 191/4. No corresponding CT changes noted. -Previous tracer avid sclerotic bone lesion involving the right seventh rib has an SUV max of 27.67 on today's study, image 110/4. Previously 27.6 on the previous exam. IMPRESSION: 1. Marked interval progression tracer avid bone metastases. 2. Interval development of multiple tiny lung nodules. Several of these nodules are tracer avid and are worrisome for pulmonary metastases. 3. Aortic Atherosclerosis (ICD10-I70.0). Coronary artery calcifications. Electronically Signed   By: Kerby Moors M.D.   On: 11/23/2021 10:50      HISTORY:   Past Medical History:  Diagnosis Date  .  Hyperlipidemia   . Malignant neoplasm of prostate ([place])   . Supraventricular tachycardia [facility])     Past Surgical History:  Procedure Laterality Date  . PROSTATECTOMY  07/2014  . TONSILLECTOMY      Family History  Problem Relation Age of Onset  . Breast cancer Mother 73  . Ovarian cancer Mother 61  . Breast cancer Paternal Grandmother 28    Social History:  reports that he has never smoked. He has never used smokeless tobacco. He reports that he does not currently use alcohol. He reports that he does not use drugs.The patient is alone today.  Allergies:  Allergies  Allergen Reactions  . Atorvastatin   . Rosuvastatin Other (See Comments)    Other reaction(s): Muscle pain  . Simvastatin Other (See Comments)    Other reaction(s): Joint pain, Muscle pain  . Sulfa Antibiotics   . Sulfamethoxazole-Trimethoprim Other (See Comments)    Unknown as to interaction was a baby.    Current Medications: Current Outpatient Medications  Medication Sig Dispense Refill  . Alirocumab (PRALUENT [place]) Inject into the skin. One injection every 2 weeks    . aspirin 81 MG EC tablet Take 81 mg by mouth daily.    . Cholecalciferol 25 MCG (1000 UT) tablet Take by mouth.    . clopidogrel (PLAVIX) 75 MG tablet Take 75 mg by mouth daily.    [name] dexamethasone (DECADRON) 4 MG tablet Take 2 tabs by mouth 2 times daily starting day before chemo. Then take 2 tabs daily for 2 days starting day after chemo. Take with food. 30 tablet 1  . dexamethasone (DECADRON) 4 MG tablet TAKE TWO TABLETS BY MOUTH TWICE A DAY (START THE DAY BEFORE CHEMO THEN TAKE DAILY FOR 2 DAYS STARTING THE DAY AFTER CHEMO)    . diphenhydrAMINE (SOMINEX) 25 MG tablet Take 50 mg by mouth 2 (two) times daily.    [name] ezetimibe (ZETIA) 10 MG tablet Take by mouth.    . isosorbide mononitrate (IMDUR) 30 MG 24 hr tablet  Take 1 tablet by mouth every 8 (eight) hours as needed.    [name] leuprolide (LUPRON) 22.5 MG injection Inject 22.5 mg into the muscle  every 3 (three) months.    . metoprolol tartrate (LOPRESSOR) 25 MG tablet TAKE ONE-HALF TABLET BY MOUTH TWICE A DAY FOR HEART    . morphine (MS CONTIN) 15 MG 12 hr tablet Take 1 tablet (15 mg total) by mouth every 12 (twelve) hours. 60 tablet 0  . Multiple Vitamins-Minerals (MULTIVITAMIN WITH MINERALS) tablet Take 1 tablet by mouth daily.    . nitroGLYCERIN (NITROSTAT) 0.4 MG SL tablet DISSOLVE ONE TABLET UNDER THE TONGUE EVERY 5 MINUTES AS NEEDED FOR CHEST PAIN (REPEAT EVERY 5 MINUTES IF NEEDED FOR A TOTAL OF 3 TABLETS IN 15 MINUTES. IF NO RELIEF, CALL 911.)    . Omega-3 Fatty Acids (FISH OIL) 1000 MG CAPS Take by mouth.    . ondansetron (ZOFRAN) 8 MG tablet Take 1 tablet (8 mg total) by mouth every 8 (eight) hours as needed for nausea or vomiting. 30 tablet 1  . predniSONE (DELTASONE) 5 MG tablet Take 1 tablet (5 mg total) by mouth in the morning and at bedtime. 60 tablet 11  . prochlorperazine (COMPAZINE) 10 MG tablet Take 1 tablet (10 mg total) by mouth every 6 (six) hours as needed for nausea or vomiting. 30 tablet 1  . Royal Jelly-Bee Pollen-Ginseng (KOREAN GINSENG COMPLEX) 150-250-50 MG CAPS Take by mouth.    . senna (SENOKOT) 8.6 MG TABS tablet Take 1 tablet by mouth.    . Zoledronic Acid (ZOMETA) 4 MG/100ML IVPB Inject 4 mg into the vein.     No current facility-administered medications for this visit.

## 2021-12-17 NOTE — Telephone Encounter (Signed)
     Patient  visit on 9/30  at [facility]  Have you been able to follow up with your primary care physician? yes  The patient was or was not able to obtain any needed medicine or equipment. yes  Are there diet recommendations that you are having difficulty following?  na Patient expresses understanding of discharge instructions and education provided has no other needs at this time.  yes    [facility], Care Management  [phone] [address], [place], [place] [address] Phone: [phone] Email: [email or web address]

## 2021-12-18 ENCOUNTER — Encounter: Payer: Self-pay | Admitting: Oncology

## 2021-12-18 ENCOUNTER — Inpatient Hospital Stay: Payer: No Typology Code available for payment source | Attending: Hematology and Oncology | Admitting: Oncology

## 2021-12-18 ENCOUNTER — Inpatient Hospital Stay: Payer: No Typology Code available for payment source

## 2021-12-18 VITALS — BP 146/69 | HR 54 | Temp 97.8°F | Resp 16 | Ht 65.5 in | Wt 189.8 lb

## 2021-12-18 DIAGNOSIS — Z5189 Encounter for other specified aftercare: Secondary | ICD-10-CM | POA: Diagnosis not present

## 2021-12-18 DIAGNOSIS — E785 Hyperlipidemia, unspecified: Secondary | ICD-10-CM | POA: Insufficient documentation

## 2021-12-18 DIAGNOSIS — Z79818 Long term (current) use of other agents affecting estrogen receptors and estrogen levels: Secondary | ICD-10-CM | POA: Diagnosis not present

## 2021-12-18 DIAGNOSIS — Z7952 Long term (current) use of systemic steroids: Secondary | ICD-10-CM | POA: Insufficient documentation

## 2021-12-18 DIAGNOSIS — D649 Anemia, unspecified: Secondary | ICD-10-CM | POA: Diagnosis not present

## 2021-12-18 DIAGNOSIS — C7952 Secondary malignant neoplasm of bone marrow: Secondary | ICD-10-CM

## 2021-12-18 DIAGNOSIS — M8589 Other specified disorders of bone density and structure, multiple sites: Secondary | ICD-10-CM | POA: Insufficient documentation

## 2021-12-18 DIAGNOSIS — Z7902 Long term (current) use of antithrombotics/antiplatelets: Secondary | ICD-10-CM | POA: Insufficient documentation

## 2021-12-18 DIAGNOSIS — D72829 Elevated white blood cell count, unspecified: Secondary | ICD-10-CM | POA: Insufficient documentation

## 2021-12-18 DIAGNOSIS — C7951 Secondary malignant neoplasm of bone: Secondary | ICD-10-CM

## 2021-12-18 DIAGNOSIS — M546 Pain in thoracic spine: Secondary | ICD-10-CM | POA: Diagnosis not present

## 2021-12-18 DIAGNOSIS — Z7982 Long term (current) use of aspirin: Secondary | ICD-10-CM | POA: Diagnosis not present

## 2021-12-18 DIAGNOSIS — C61 Malignant neoplasm of prostate: Secondary | ICD-10-CM

## 2021-12-18 DIAGNOSIS — C7800 Secondary malignant neoplasm of unspecified lung: Secondary | ICD-10-CM | POA: Diagnosis not present

## 2021-12-18 DIAGNOSIS — R918 Other nonspecific abnormal finding of lung field: Secondary | ICD-10-CM | POA: Diagnosis not present

## 2021-12-18 DIAGNOSIS — Z5111 Encounter for antineoplastic chemotherapy: Secondary | ICD-10-CM | POA: Diagnosis present

## 2021-12-18 LAB — HEPATIC FUNCTION PANEL
ALT: 21 U/L (ref 10–40)
AST: 27 (ref 14–40)
Alkaline Phosphatase: 86 (ref 25–125)
Bilirubin, Total: 0.4

## 2021-12-18 LAB — BASIC METABOLIC PANEL
BUN: 20 (ref 4–21)
CO2: 30 — AB (ref 13–22)
Chloride: 103 (ref 99–108)
Creatinine: 1 (ref 0.6–1.3)
Glucose: 87
Potassium: 4.2 mEq/L (ref 3.5–5.1)
Sodium: 137 (ref 137–147)

## 2021-12-18 LAB — COMPREHENSIVE METABOLIC PANEL
Albumin: 4.2 (ref 3.5–5.0)
Calcium: 9.6 (ref 8.7–10.7)
eGFR: >60

## 2021-12-18 LAB — CBC AND DIFFERENTIAL
HCT: 40 — AB (ref 41–53)
Hemoglobin: 12.9 — AB (ref 13.5–17.5)
Neutrophils Absolute: 11.59
Platelets: 176 10*3/uL (ref 150–400)
WBC: 16.1

## 2021-12-18 LAB — CBC: RBC: 4.49 (ref 3.87–5.11)

## 2021-12-18 LAB — PSA: Prostatic Specific Antigen: 531.36 ng/mL — ABNORMAL HIGH (ref 0.00–4.00)

## 2021-12-21 MED FILL — Dexamethasone Sodium Phosphate Inj 100 MG/10ML: INTRAMUSCULAR | Qty: 1 | Status: AC

## 2021-12-21 MED FILL — Docetaxel Soln for IV Infusion 160 MG/16ML: INTRAVENOUS | Qty: 12 | Status: AC

## 2021-12-22 ENCOUNTER — Inpatient Hospital Stay: Payer: No Typology Code available for payment source

## 2021-12-22 VITALS — BP 132/67 | HR 51 | Temp 97.5°F | Resp 18 | Ht 65.5 in | Wt 193.0 lb

## 2021-12-22 DIAGNOSIS — C7951 Secondary malignant neoplasm of bone: Secondary | ICD-10-CM

## 2021-12-22 DIAGNOSIS — Z5111 Encounter for antineoplastic chemotherapy: Secondary | ICD-10-CM | POA: Diagnosis not present

## 2021-12-22 DIAGNOSIS — C61 Malignant neoplasm of prostate: Secondary | ICD-10-CM

## 2021-12-22 MED ORDER — HEPARIN SOD (PORK) LOCK FLUSH 100 UNIT/ML IV SOLN
500.0000 [IU] | Freq: Once | INTRAVENOUS | Status: AC | PRN
  Administered 2021-12-22: 500 [IU]

## 2021-12-22 MED ORDER — SODIUM CHLORIDE 0.9 % IV SOLN: Freq: Once | INTRAVENOUS | Status: AC

## 2021-12-22 MED ORDER — SODIUM CHLORIDE 0.9 % IV SOLN
60.0000 mg/m2 | Freq: Once | INTRAVENOUS | Status: AC
  Administered 2021-12-22: 120 mg via INTRAVENOUS
  Filled 2021-12-22: qty 12

## 2021-12-22 MED ORDER — SODIUM CHLORIDE 0.9 % IV SOLN
10.0000 mg | Freq: Once | INTRAVENOUS | Status: AC
  Administered 2021-12-22: 10 mg via INTRAVENOUS
  Filled 2021-12-22: qty 10

## 2021-12-22 MED ORDER — SODIUM CHLORIDE 0.9% FLUSH
10.0000 mL | INTRAVENOUS | Status: DC | PRN
  Administered 2021-12-22: 10 mL

## 2021-12-22 NOTE — Progress Notes (Signed)
PATIENT WAS DISCHARGED AT 1135.[address] Provencal

## 2021-12-22 NOTE — Patient Instructions (Signed)
Docetaxel Injection What is this medication? DOCETAXEL (doe se TAX el) treats some types of cancer. It works by slowing down the growth of cancer cells. This medicine may be used for other purposes; ask your health care provider or pharmacist if you have questions. COMMON BRAND NAME(S): Docefrez, Taxotere What should I tell my care team before I take this medication? They need to know if you have any of these conditions: Kidney disease Liver disease Low white blood cell levels Tingling of the fingers or toes or other nerve disorder An unusual or allergic reaction to docetaxel, polysorbate 80, other medications, foods, dyes, or preservatives Pregnant or trying to get pregnant Breast-feeding How should I use this medication? This medication is injected into a vein. It is given by your care team in a hospital or clinic setting. Talk to your care team about the use of this medication in children. Special care may be needed. Overdosage: If you think you have taken too much of this medicine contact a poison control center or emergency room at once. NOTE: This medicine is only for you. Do not share this medicine with others. What if I miss a dose? Keep appointments for follow-up doses. It is important not to miss your dose. Call your care team if you are unable to keep an appointment. What may interact with this medication? Do not take this medication with any of the following: Live virus vaccines This medication may also interact with the following: Certain antibiotics, such as clarithromycin, telithromycin Certain antivirals for HIV or hepatitis Certain medications for fungal infections, such as itraconazole, ketoconazole, voriconazole Grapefruit juice Nefazodone Supplements, such as St. John's wort This list may not describe all possible interactions. Give your health care provider a list of all the medicines, herbs, non-prescription drugs, or dietary supplements you use. Also tell them if  you smoke, drink alcohol, or use illegal drugs. Some items may interact with your medicine. What should I watch for while using this medication? This medication may make you feel generally unwell. This is not uncommon as chemotherapy can affect healthy cells as well as cancer cells. Report any side effects. Continue your course of treatment even though you feel ill unless your care team tells you to stop. You may need blood work done while you are taking this medication. This medication can cause serious side effects and infusion reactions. To reduce the risk, your care team may give you other medications to take before receiving this one. Be sure to follow the directions from your care team. This medication may increase your risk of getting an infection. Call your care team for advice if you get a fever, chills, sore throat, or other symptoms of a cold or flu. Do not treat yourself. Try to avoid being around people who are sick. Avoid taking medications that contain aspirin, acetaminophen, ibuprofen, naproxen, or ketoprofen unless instructed by your care team. These medications may hide a fever. Be careful brushing or flossing your teeth or using a toothpick because you may get an infection or bleed more easily. If you have any dental work done, tell your dentist you are receiving this medication. Some products may contain alcohol. Ask your care team if this medication contains alcohol. Be sure to tell all care teams you are taking this medicine. Certain medications, like metronidazole and disulfiram, can cause an unpleasant reaction when taken with alcohol. The reaction includes flushing, headache, nausea, vomiting, sweating, and increased thirst. The reaction can last from 30 minutes to several hours.  This medication may affect your coordination, reaction time, or judgement. Do not drive or operate machinery until you know how this medication affects you. Sit up or stand slowly to reduce the risk of  dizzy or fainting spells. Drinking alcohol with this medication can increase the risk of these side effects. Talk to your care team about your risk of cancer. You may be more at risk for certain types of cancer if you take this medication. Talk to your care team if you wish to become pregnant or think you might be pregnant. This medication can cause serious birth defects if taken during pregnancy or if you get pregnant within 2 months after stopping therapy. A negative pregnancy test is required before starting this medication. A reliable form of contraception is recommended while taking this medication and for 2 months after stopping it. Talk to your care team about reliable forms of contraception. Do not breast-feed while taking this medication and for 1 week after stopping therapy. Use a condom during sex and for 4 months after stopping therapy. Tell your care team right away if you think your partner might be pregnant. This medication can cause serious birth defects. This medication may cause infertility. Talk to your care team if you are concerned about your fertility. What side effects may I notice from receiving this medication? Side effects that you should report to your care team as soon as possible: Allergic reactions--skin rash, itching, hives, swelling of the face, lips, tongue, or throat Change in vision such as blurry vision, seeing halos around lights, vision loss Infection--fever, chills, cough, or sore throat Infusion reactions--chest pain, shortness of breath or trouble breathing, feeling faint or lightheaded Low red blood cell level--unusual weakness or fatigue, dizziness, headache, trouble breathing Pain, tingling, or numbness in the hands or feet Painful swelling, warmth, or redness of the skin, blisters or sores at the infusion site Redness, blistering, peeling, or loosening of the skin, including inside the mouth Sudden or severe stomach pain, bloody diarrhea, fever, nausea,  vomiting Swelling of the ankles, hands, or feet Tumor lysis syndrome (TLS)--nausea, vomiting, diarrhea, decrease in the amount of urine, dark urine, unusual weakness or fatigue, confusion, muscle pain or cramps, fast or irregular heartbeat, joint pain Unusual bruising or bleeding Side effects that usually do not require medical attention (report to your care team if they continue or are bothersome): Change in nail shape, thickness, or color Change in taste Hair loss Increased tears This list may not describe all possible side effects. Call your doctor for medical advice about side effects. You may report side effects to FDA at 1-800-FDA-1088. Where should I keep my medication? This medication is given in a hospital or clinic. It will not be stored at home. NOTE: This sheet is a summary. It may not cover all possible information. If you have questions about this medicine, talk to your doctor, pharmacist, or health care provider.  2023 Elsevier/Gold Standard (2021-05-01 00:00:00)    Marvin Johnson  Discharge Instructions: Thank you for choosing [facility] to provide your oncology and hematology care.  If you have a lab appointment with the [facility], please go directly to the [facility] and check in at the registration area.   Wear comfortable clothing and clothing appropriate for easy access to any Portacath or PICC line.   We strive to give you quality time with your provider. You may need to reschedule your appointment if you arrive late (15 or more  minutes).  Arriving late affects you and other patients whose appointments are after yours.  Also, if you miss three or more appointments without notifying the office, you may be dismissed from the clinic at the provider's discretion.      For prescription refill requests, have your pharmacy contact our office and allow 72 hours for refills to be completed.    Today you received the following  chemotherapy and/or immunotherapy agents Docetaxel      To help prevent nausea and vomiting after your treatment, we encourage you to take your nausea medication as directed.  BELOW ARE SYMPTOMS THAT SHOULD BE REPORTED IMMEDIATELY: *FEVER GREATER THAN 100.4 F (38 C) OR HIGHER *CHILLS OR SWEATING *NAUSEA AND VOMITING THAT IS NOT CONTROLLED WITH YOUR NAUSEA MEDICATION *UNUSUAL SHORTNESS OF BREATH *UNUSUAL BRUISING OR BLEEDING *URINARY PROBLEMS (pain or burning when urinating, or frequent urination) *BOWEL PROBLEMS (unusual diarrhea, constipation, pain near the anus) TENDERNESS IN MOUTH AND THROAT WITH OR WITHOUT PRESENCE OF ULCERS (sore throat, sores in mouth, or a toothache) UNUSUAL RASH, SWELLING OR PAIN  UNUSUAL VAGINAL DISCHARGE OR ITCHING   Items with * indicate a potential emergency and should be followed up as soon as possible or go to the Emergency Department if any problems should occur.  Please show the CHEMOTHERAPY ALERT CARD or IMMUNOTHERAPY ALERT CARD at check-in to the Emergency Department and triage nurse.  Should you have questions after your visit or need to cancel or reschedule your appointment, please contact [facility]  Dept: [phone]  and follow the prompts.  Office hours are 8:00 a.m. to 4:30 p.m. Monday - Friday. Please note that voicemails left after 4:00 p.m. may not be returned until the following business day.  We are closed weekends and major holidays. You have access to a nurse at all times for urgent questions. Please call the main number to the clinic Dept: [phone] and follow the prompts.  For any non-urgent questions, you may also contact your provider using MyChart. We now offer e-Visits for anyone 36 and older to request care online for non-urgent symptoms. For details visit [email or web address].   Also download the MyChart app! Go to the app store, search "MyChart", open the app, select [facility], and log in with your  MyChart username and password.  Masks are optional in the cancer centers. If you would like for your care team to wear a mask while they are taking care of you, please let them know. You may have one support person who is at least 73 years old accompany you for your appointments.

## 2021-12-24 ENCOUNTER — Inpatient Hospital Stay: Payer: No Typology Code available for payment source

## 2021-12-24 VITALS — BP 125/60 | HR 60 | Temp 98.1°F | Resp 18 | Ht 65.5 in | Wt 193.5 lb

## 2021-12-24 DIAGNOSIS — C7951 Secondary malignant neoplasm of bone: Secondary | ICD-10-CM

## 2021-12-24 DIAGNOSIS — Z5111 Encounter for antineoplastic chemotherapy: Secondary | ICD-10-CM | POA: Diagnosis not present

## 2021-12-24 DIAGNOSIS — C61 Malignant neoplasm of prostate: Secondary | ICD-10-CM

## 2021-12-24 MED ORDER — PEGFILGRASTIM-CBQV 6 MG/0.6ML ~~LOC~~ SOSY
6.0000 mg | PREFILLED_SYRINGE | Freq: Once | SUBCUTANEOUS | Status: AC
  Administered 2021-12-24: 6 mg via SUBCUTANEOUS
  Filled 2021-12-24: qty 0.6

## 2021-12-24 NOTE — Patient Instructions (Signed)

## 2021-12-25 ENCOUNTER — Other Ambulatory Visit: Payer: Self-pay

## 2021-12-25 DIAGNOSIS — C7951 Secondary malignant neoplasm of bone: Secondary | ICD-10-CM

## 2021-12-25 DIAGNOSIS — C61 Malignant neoplasm of prostate: Secondary | ICD-10-CM

## 2021-12-25 MED ORDER — MORPHINE SULFATE ER 15 MG PO TBCR: 15.0000 mg | EXTENDED_RELEASE_TABLET | Freq: Two times a day (BID) | ORAL | 0 refills | Status: DC

## 2022-01-07 ENCOUNTER — Encounter: Payer: Self-pay | Admitting: Oncology

## 2022-01-07 NOTE — Progress Notes (Signed)
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 Marvin Johnson has been on enzalutamide 160 mg daily, as well as leuprolide 22.5 mg, and zoledronic acid every 3 months.  His pain is controlled with MS Contin 15 mg twice daily with occasional Tylenol for breakthrough.   Marvin Johnson has been on enzalutamide since June 2018.  Marvin Johnson then had a teadily rising PSA, up to 83.49 in November 2022.  PSMA PET in October 2022 revealed an oligometastasis skeletal lesion it in the anterior right 7th rib, with increased activity, and very subtle sclerotic change at T10, but no evidence of progressive disease.The PSA has continued to fluctuate up and down.  It was down to 31 in February of 2023, then back up to 76 in May.  It then increased dramatically to 334 in August and Marvin Johnson was scheduled for a PSMA PET scan.  This revealed severe progression of disease with several tiny lung nodules which did have hypermetabolic activity with SUV up to 5.  Marvin Johnson had increased bone lesions which are now innumerable, including major lesions at L3, the right iliac wing, and the left pubic bone. These were not visible on CT scan. Marvin Johnson has started Taxotere chemotherapy in September and is tolerating well.  Secondary malignant neoplasm of bone and bone marrow ([place]) Hel continues zoledronic acid every 3 months in addition to his cancer therapy.  Marvin Johnson now has marked progression of his bone metastases but minimal symptoms.  Anemia. This is a change and likely related to the chemotherapy. This would be his nadir so we will see if it is any better by next week.  Leukocytosis This is due to the Neulasta injection last week.     Marvin Johnson had a dramatic change in his bone metastases and now there are  several tiny but suspicious lung lesions.  His PSA had jumped from 78 to 334, which prompted the new scan.  We stopped his enzalutamide and started Taxotere chemotherapy every 3 weeks in September. Marvin Johnson is here 10 days later with repeat labs and has a marked leukocytosis due to the Neulasta. Marvin Johnson has mild anemia but says his fatigue is no worse than usual. Marvin Johnson will return in 1 week with CBC, CMP and PSA and have his 2nd cycle in 10 days. The patient understands the plans discussed today and is in agreement with them.  Marvin Johnson knows to contact our office if Marvin Johnson develops concerns prior to his next appointment.  Their questions were answered.   I provided 20 minutes of face-to-face time during this encounter and > 50% was spent counseling as documented under my assessment and plan.    [name], MD  [facility] [address] [place] [address] Dept: [phone] Dept Fax: [phone]   Orders Placed This Encounter  Procedures   CBC and differential    This external order was created through the Results Console.   CBC    This external order was created through the Results Console.   Basic metabolic panel    This external order was created through the Results Console.   Comprehensive metabolic panel    This external order was created through the Results Console.   Hepatic function panel  This external order was created through the Results Console.       CHIEF COMPLAINT:  CC: Metastatic prostate cancer to bone, progressive  Current Treatment: Leuprolide/zoledronic acid and docetaxel  HISTORY OF PRESENT ILLNESS:  Marvin Johnson is a 71 year old male with a history of stage IV (T3b N1 M0) prostate cancer diagnosed in January 2016 when Marvin Johnson was found to have a PSA of 51.8.  Marvin Johnson was treated with a laparoscopic prostatectomy in May 2016.  Pathology revealed adenocarcinoma with a Gleason 8.  There was a positive posterior  margin with extraprostatic extension, as well as left retro-urethral tissue positive and extension to the seminal vesicles bilaterally with multiple positive margins.  Two lymph nodes from the right side were negative, but 2/4 lymph nodes from the left side were positive for metastasis. Repeat bone scan in June 2016 revealed a new lesion at T10, which was not present on his baseline bone scan.  PSA at Dr. [name] office in early August 2016 remained elevated at 56.4.   We began seeing him in August 2016. The PSA was up to 216.3 in late August 2016, so hormonal therapy was recommended  X-rays of the thoracic spine revealed osteoarthritis and scoliosis, as well as evidence of demineralization, but we could not visualize a metastatic lesion.[name] Marvin Johnson started leuprolide in September 2016.  The PSA initially dropped steadily with leuprolide.  Bone density scan in November 2016 revealed osteopenia, with a T-score of -2.1 in the spine and a T-score of -0.9 in the femur.  Marvin Johnson had been taking vitamin-D 1000 international units daily, but not calcium, so calcium 600 mg twice daily was added at that time.    Leuprolide was held in February 2017 because of chest pain.  EKG was unremarkable.  Marvin Johnson went to the [place] and Marvin Johnson was referred to the [facility] for placement of 2 coronary artery stents for 95% and 99% occlusions.  Marvin Johnson then had a third coronary artery stent placed later in the summer.  Leuprolide was resumed in March 2017.  In September 2017, Marvin Johnson had an increase in the PSA, so we repeated a bone scan.  This revealed intense uptake within the right posterior elements of T10, but no other areas of metastasis.  We then added bicalutamide 50 mg daily to his leuprolide.     Marvin Johnson had a steadily increasing PSA despite the bicalutamide, so this was discontinued in March 2018.  Marvin Johnson eventually was placed on MS Contin 15 mg twice daily, with oxycodone 10 mg every 4 hours as needed for breakthrough pain.  MRI thoracic spine in April 2018 revealed  increasing tumor at T10 with epidural spread.  Bone scan at that time revealed increased activity in the T10 lesion, which was stable.  There was a questionable tiny focus in the right ischium/acetabular rim.  Plain x-rays of the bilateral hips and pelvis did not reveal any focal abnormality. The PSA increased from 27.5 in March, to 72.2 in May, then to 92.2 in June 2018.  Marvin Johnson was referred for radiotherapy to the T10 lesion due to the severe pain with improvement in his pain. Marvin Johnson was started on zoledronic acid along with his leuprolide in June 2018.  Marvin Johnson had a Port-A-Cath placed in anticipation of chemotherapy, but then we recommended that Marvin Johnson be placed on enzalutamide 160 mg daily instead, in order to delay the time to initiation of chemotherapy.  Marvin Johnson started enzalutamide in June 2018.  The PSA became undetectable in October 2018.  We  continued MS Contin, but discontinued the oxycodone. Marvin Johnson did not tolerate meloxicam, so uses Tylenol as needed for breakthrough pain.     Marvin Johnson had a stress test in January 2020 and was referred to [facility] for coronary artery stenting, which was done in February.  Zoledronic acid had been given monthly for over 2 years, so was changed to every 3 months in October 2020.   The PSA started to increase in January 2021 at which time it was 0.2.  CT chest, abdomen and pelvis in March 2021 not reveal any lymphadenopathy or soft tissue metastatic disease.  Hepatic steatosis was seen.  There was a stable sclerotic osseous lesion of the T10 vertebral body, as well as a subtle sclerotic lesion of the L3 vertebral body, also possibly a small metastatic lesion.  Whole body bone scan did not reveal continued increased activity of T10 and right acetabulum posteriorly.  The PSA was 0.5 in March.  PSA had increased to 3.0 in June.  Axumin PET imaging from July did not reveal any evidence of active prostate carcinoma, local recurrence, or metastatic disease. The PSA continued to slowly  rise and was up to 5.9 in September, 10.9 in October, and 16.3 in December.     The PSA went up to 23.81 in March 2022, so an [place] PET scan was ordered which revealed an oligometastasis skeletal lesion in the anterior right 7th rib with very subtle sclerotic change at this level. PSA in June decreased to 18.6, but then in September increased to 58.  In view of the significant rise in his PSA, a prostate specific PET scan was ordered.  PSMA PET in October 2022 revealed a persistent focus of intense radiotracer activity in the anterior RIGHT seventh rib with increased in activity from comparison exam and subtle sclerotic changes on CT. Findings remain consistent with oligometastatic skeletal metastasis. Sclerotic lesions in the T10 vertebral body without radiotracer activity potentially represented prior treated prostate cancer metastasis.  We recommended Marvin Johnson continue his current treatment with enzalutamide, leuprolide and zoledronic acid.  PSA in November continued to increase to 83.49.  Even though his PSA continued to steadily increase, we did not recommend a change in therapy based on this alone.  Marvin Johnson had no severe pain and no increase in pain and was having a good quality of life.  The PSA has continued to fluctuate up and down and was 31 in February and 78 in May.   Oncology History  Malignant neoplasm of prostate ([place])  03/30/2014 Cancer Staging   Staging form: Prostate, AJCC 8th Edition - Clinical stage from 03/30/2014: Stage IVA (cT3b, cN1, cM0, PSA: 51.8, Grade Group: 4) - Signed by [name], MD on 11/14/2020 Histopathologic type: Adenocarcinoma, NOS Stage prefix: Initial diagnosis Prostate specific antigen (PSA) range: 20 or greater Gleason primary pattern: 4 Gleason secondary pattern: 4 Gleason score: 8 Histologic grading system: 5 grade system Location of positive needle core biopsies: Beyond primary site Prognostic indicators: May 2016 Robotic prostatectomy with mult. Pos  margins, extra prostatic extension, 2/6 pos nodes. Scan June 2016 positive at T10  PSA up to 216.3 by August Placed on lupron and casodex Stage used in treatment planning: Yes National guidelines used in treatment planning: Yes Type of national guideline used in treatment planning: NCCN   02/18/2020 Initial Diagnosis   Malignant neoplasm of prostate ([place])   12/01/2021 -  Chemotherapy   Patient is on Treatment Plan : PROSTATE Docetaxel (75) + Prednisone q21d  Secondary malignant neoplasm of bone and bone marrow ([place])  02/18/2020 Initial Diagnosis   Secondary malignant neoplasm of bone and bone marrow ([place])   12/01/2021 -  Chemotherapy   Patient is on Treatment Plan : PROSTATE Docetaxel (75) + Prednisone q21d         INTERVAL HISTORY:  Mainor is here today for repeat clinical assessment after his 1st cycle of Taxotere.  Marvin Johnson had been on the enzalutamide for 5 years and it has kept his disease stable.  However his most recent PSA increased from 78.11 to 334.0 and so I ordered a PSMA PET scan. This revealed severe progression of disease with several tiny lung nodules which did have hypermetabolic activity with SUV up to 5.  Marvin Johnson had increased bone lesions which are now innumerable, including major lesions at L3, the right iliac wing, and the left pubic bone. These were not visible on CT scan.  We stopped the enzalutamide and started him on Taxotere chemotherapy. By the time we started, his PSA was up to 438 on 9/13. Marvin Johnson does note some fatigue.  Marvin Johnson denies increasing pain.  Marvin Johnson still remains on MS Contin 15 mg every 12 hours and only requires Tylenol for breakthrough pain.  Marvin Johnson denies new symptoms.  Marvin Johnson denies fevers or chills. Marvin Johnson denies pain. His appetite is okay.  schedule. His hemoglobin has decreased from 13.6 to 12.5, likely from the chemotherapy, and his WBC's increased to 41.900, from the Neulasta.  I have reviewed these results and answered their questions.  REVIEW OF SYSTEMS:  Review of Systems   Constitutional:  Negative for appetite change, chills, fever and unexpected weight change. Fatigue: Mild. HENT:   Negative for lump/mass, mouth sores and sore throat.   Respiratory:  Negative for cough and shortness of breath.   Cardiovascular:  Negative for chest pain and leg swelling.  Gastrointestinal:  Negative for abdominal pain, constipation, diarrhea, nausea and vomiting.  Genitourinary:  Negative for difficulty urinating, dysuria, frequency and hematuria.   Musculoskeletal:  Negative for arthralgias, back pain and myalgias.  Skin:  Negative for itching, rash and wound.  Neurological:  Negative for dizziness, extremity weakness, headaches, light-headedness and numbness.  Hematological:  Negative for adenopathy.  Psychiatric/Behavioral:  Negative for depression and sleep disturbance. The patient is not nervous/anxious.      VITALS:  Blood pressure (!) 148/68, pulse 66, temperature 98 F (36.7 C), temperature source Oral, resp. rate 18, height 5' 5.5" (1.664 m), weight 189 lb 11.2 oz (86 kg), SpO2 94 %.  Wt Readings from Last 3 Encounters:  12/24/21 193 lb 8 oz (87.8 kg)  12/22/21 193 lb (87.5 kg)  12/18/21 189 lb 12.8 oz (86.1 kg)    Body mass index is 31.09 kg/m.  Performance status (ECOG): 1 - Symptomatic but completely ambulatory  PHYSICAL EXAM:  Physical Exam Vitals and nursing note reviewed.  Constitutional:      General: Marvin Johnson is not in acute distress.    Appearance: Normal appearance. Marvin Johnson is normal weight.  HENT:     Head: Normocephalic and atraumatic.     Mouth/Throat:     Mouth: Mucous membranes are moist.     Pharynx: Oropharynx is clear. No oropharyngeal exudate or posterior oropharyngeal erythema.  Eyes:     General: No scleral icterus.    Extraocular Movements: Extraocular movements intact.     Conjunctiva/sclera: Conjunctivae normal.     Pupils: Pupils are equal, round, and reactive to light.  Cardiovascular:     Rate and  Rhythm: Normal rate and regular  rhythm.     Heart sounds: Normal heart sounds. No murmur heard.    No friction rub. No gallop.  Pulmonary:     Effort: Pulmonary effort is normal.     Breath sounds: Normal breath sounds. No wheezing, rhonchi or rales.  Abdominal:     General: Bowel sounds are normal. There is no distension.     Palpations: Abdomen is soft. There is no hepatomegaly, splenomegaly or mass.     Tenderness: There is no abdominal tenderness.  Musculoskeletal:        General: Normal range of motion.     Cervical back: Normal range of motion and neck supple. No tenderness.     Right lower leg: No edema.     Left lower leg: No edema.  Lymphadenopathy:     Cervical: No cervical adenopathy.     Upper Body:     Right upper body: No supraclavicular or axillary adenopathy.     Left upper body: No supraclavicular or axillary adenopathy.     Lower Body: No right inguinal adenopathy. No left inguinal adenopathy.  Skin:    General: Skin is warm and dry.     Coloration: Skin is not jaundiced.     Findings: No rash.  Neurological:     Mental Status: Marvin Johnson is alert and oriented to person, place, and time.     Cranial Nerves: No cranial nerve deficit.  Psychiatric:        Mood and Affect: Mood normal.        Behavior: Behavior normal.        Thought Content: Thought content normal.    LABS:      Latest Ref Rng & Units 12/18/2021   12:00 AM 12/11/2021   12:00 AM 11/25/2021   12:00 AM  CBC  WBC  16.1     41.9     7.9      Hemoglobin 13.5 - 17.5 12.9     12.5     13.6      Hematocrit 41 - 53 40     39     40      Platelets 150 - 400 K/uL 176     196     275         This result is from an external source.      Latest Ref Rng & Units 12/18/2021   12:00 AM 12/11/2021   12:00 AM 11/25/2021   12:00 AM  CMP  BUN 4 - '21 20     20     15      '$ Creatinine 0.6 - 1.3 1.0     0.7     0.6      Sodium 137 - 147 137     137     138      Potassium 3.5 - 5.1 mEq/L 4.2     4.1     3.5      Chloride 99 - 108 103     102      105      CO2 13 - '22 30     28     23      '$ Calcium 8.7 - 10.7 9.6     9.6     9.9      Alkaline Phos 25 - 125 86     127     56      AST 14 - 40  27     39     29      ALT 10 - 40 U/L '21     29     22         '$ This result is from an external source.     No results found for: "CEA1", "CEA" / No results found for: "CEA1", "CEA" Lab Results  Component Value Date   PSA1 334.0 (H) 10/30/2021   No results found for: "LNL892" No results found for: "CAN125"  No results found for: "TOTALPROTELP", "ALBUMINELP", "A1GS", "A2GS", "BETS", "BETA2SER", "GAMS", "MSPIKE", "SPEI" No results found for: "TIBC", "FERRITIN", "IRONPCTSAT" No results found for: "LDH"  STUDIES:  No results found.    HISTORY:   Past Medical History:  Diagnosis Date   Hyperlipidemia    Malignant neoplasm of prostate ([place])    Supraventricular tachycardia     Past Surgical History:  Procedure Laterality Date   PROSTATECTOMY  07/2014   TONSILLECTOMY      Family History  Problem Relation Age of Onset   Breast cancer Mother 38   Ovarian cancer Mother 76   Breast cancer Paternal Grandmother 4    Social History:  reports that Marvin Johnson has never smoked. Marvin Johnson has never used smokeless tobacco. Marvin Johnson reports that Marvin Johnson does not currently use alcohol. Marvin Johnson reports that Marvin Johnson does not use drugs.The patient is alone today.  Allergies:  Allergies  Allergen Reactions   Atorvastatin    Rosuvastatin Other (See Comments)    Other reaction(s): Muscle pain   Simvastatin Other (See Comments)    Other reaction(s): Joint pain, Muscle pain   Sulfa Antibiotics    Sulfamethoxazole-Trimethoprim Other (See Comments)    Unknown as to interaction was a baby.    Current Medications: Current Outpatient Medications  Medication Sig Dispense Refill   dexamethasone (DECADRON) 4 MG tablet TAKE TWO TABLETS BY MOUTH TWICE A DAY (START THE DAY BEFORE CHEMO THEN TAKE DAILY FOR 2 DAYS STARTING THE DAY AFTER CHEMO)     Alirocumab (PRALUENT [place]) Inject into  the skin. One injection every 2 weeks     aspirin 81 MG EC tablet Take 81 mg by mouth daily.     Cholecalciferol 25 MCG (1000 UT) tablet Take by mouth.     clopidogrel (PLAVIX) 75 MG tablet Take 75 mg by mouth daily.     dexamethasone (DECADRON) 4 MG tablet Take 2 tabs by mouth 2 times daily starting day before chemo. Then take 2 tabs daily for 2 days starting day after chemo. Take with food. 30 tablet 1   diphenhydrAMINE (SOMINEX) 25 MG tablet Take 50 mg by mouth 2 (two) times daily.     ezetimibe (ZETIA) 10 MG tablet Take by mouth.     isosorbide mononitrate (IMDUR) 30 MG 24 hr tablet Take 1 tablet by mouth every 8 (eight) hours as needed.     leuprolide (LUPRON) 22.5 MG injection Inject 22.5 mg into the muscle every 3 (three) months.     metoprolol tartrate (LOPRESSOR) 25 MG tablet TAKE ONE-HALF TABLET BY MOUTH TWICE A DAY FOR HEART     morphine (MS CONTIN) 15 MG 12 hr tablet Take 1 tablet (15 mg total) by mouth every 12 (twelve) hours. 60 tablet 0   Multiple Vitamins-Minerals (MULTIVITAMIN WITH MINERALS) tablet Take 1 tablet by mouth daily.     nitroGLYCERIN (NITROSTAT) 0.4 MG SL tablet DISSOLVE ONE TABLET UNDER THE TONGUE EVERY 5 MINUTES AS NEEDED FOR CHEST PAIN (REPEAT EVERY 5 MINUTES  IF NEEDED FOR A TOTAL OF 3 TABLETS IN 15 MINUTES. IF NO RELIEF, CALL 911.)     Omega-3 Fatty Acids (FISH OIL) 1000 MG CAPS Take by mouth.     ondansetron (ZOFRAN) 8 MG tablet Take 1 tablet (8 mg total) by mouth every 8 (eight) hours as needed for nausea or vomiting. 30 tablet 1   predniSONE (DELTASONE) 5 MG tablet Take 1 tablet (5 mg total) by mouth in the morning and at bedtime. 60 tablet 11   prochlorperazine (COMPAZINE) 10 MG tablet Take 1 tablet (10 mg total) by mouth every 6 (six) hours as needed for nausea or vomiting. 30 tablet 1   Royal Jelly-Bee Pollen-Ginseng (KOREAN GINSENG COMPLEX) 150-250-50 MG CAPS Take by mouth.     senna (SENOKOT) 8.6 MG TABS tablet Take 1 tablet by mouth.     Zoledronic Acid  (ZOMETA) 4 MG/100ML IVPB Inject 4 mg into the vein.     No current facility-administered medications for this visit.

## 2022-01-08 ENCOUNTER — Inpatient Hospital Stay: Payer: No Typology Code available for payment source

## 2022-01-08 ENCOUNTER — Encounter: Payer: Self-pay | Admitting: Hematology and Oncology

## 2022-01-08 ENCOUNTER — Inpatient Hospital Stay (INDEPENDENT_AMBULATORY_CARE_PROVIDER_SITE_OTHER): Payer: No Typology Code available for payment source | Admitting: Hematology and Oncology

## 2022-01-08 ENCOUNTER — Other Ambulatory Visit: Payer: Non-veteran care

## 2022-01-08 ENCOUNTER — Ambulatory Visit: Payer: Non-veteran care | Admitting: Hematology and Oncology

## 2022-01-08 DIAGNOSIS — C7951 Secondary malignant neoplasm of bone: Secondary | ICD-10-CM

## 2022-01-08 DIAGNOSIS — C7952 Secondary malignant neoplasm of bone marrow: Secondary | ICD-10-CM

## 2022-01-08 DIAGNOSIS — C61 Malignant neoplasm of prostate: Secondary | ICD-10-CM

## 2022-01-08 DIAGNOSIS — Z5111 Encounter for antineoplastic chemotherapy: Secondary | ICD-10-CM | POA: Diagnosis not present

## 2022-01-08 LAB — BASIC METABOLIC PANEL
BUN: 24 — AB (ref 4–21)
CO2: 30 — AB (ref 13–22)
Chloride: 105 (ref 99–108)
Creatinine: 1 (ref 0.6–1.3)
Glucose: 125
Potassium: 4.1 mEq/L (ref 3.5–5.1)
Sodium: 141 (ref 137–147)

## 2022-01-08 LAB — CBC AND DIFFERENTIAL
HCT: 37 — AB (ref 41–53)
Hemoglobin: 12 — AB (ref 13.5–17.5)
Neutrophils Absolute: 11.17
Platelets: 150 10*3/uL (ref 150–400)
WBC: 15.3

## 2022-01-08 LAB — HEPATIC FUNCTION PANEL
ALT: 24 U/L (ref 10–40)
AST: 26 (ref 14–40)
Alkaline Phosphatase: 89 (ref 25–125)
Bilirubin, Total: 0.5

## 2022-01-08 LAB — COMPREHENSIVE METABOLIC PANEL
Albumin: 4.2 (ref 3.5–5.0)
Calcium: 9.3 (ref 8.7–10.7)

## 2022-01-08 LAB — CBC: RBC: 4.1 (ref 3.87–5.11)

## 2022-01-08 LAB — PSA: Prostatic Specific Antigen: 536.11 ng/mL — ABNORMAL HIGH (ref 0.00–4.00)

## 2022-01-08 MED ORDER — DEXAMETHASONE 4 MG PO TABS: ORAL_TABLET | ORAL | 1 refills | Status: DC

## 2022-01-08 NOTE — Assessment & Plan Note (Signed)
He continues zoledronic acid every 3 months in addition to docetaxel/prednisone.

## 2022-01-08 NOTE — Assessment & Plan Note (Addendum)
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 He had been on enzalutamide 160 mg daily in 2018, in addition to leuprolide 22.5 mg, and zoledronic acid, which is now being given every 3 months.  He has been on MS Contin 15 mg twice daily with occasional Tylenol for breakthrough with good control of this pain.  The PSA began to steadily rise and was up to 83.49 in November 2022.PSMAPET in October2022 revealed an oligometastasis skeletal lesion it in the anterior right 7th rib, with increased activity, andvery subtle sclerotic change at T10,butno evidence ofprogressive disease.The PSA hascontinued to fluctuate up and down. It was down to 31 in February 2023, then back up to 86 in May.  It then increased dramatically to 334 in August.  PSMA PET scan revealed severe progression of disease with several new tiny lung nodules, which did have hypermetabolic activity with SUV up to 5.  There was an increase in the bone lesions, which are now innumerable, including major lesions at L3, the right iliac wing, and the left pubic bone.  The PSA increased to 482 in September.  He is started palliative chemotherapy with docetaxel every 3 weeks with prednisone 5 mg twice daily) in September.  He had difficulty with fatigue, generalized pain and low-grade headaches with his first cycle, for which Tylenol was effective.  His PSA increased slightly to 531 prior to his 2nd cycle of chemotherapy.  He tolerated his 2nd cycle fairly well with fatigue, occasional leg pain and headaches, which he managed with Tylenol.  He will proceed with a 3rd cycle next week.  We will plan to see him back in 3 weeks prior to a 4th cycle.

## 2022-01-08 NOTE — Progress Notes (Signed)
[facility]  [address] [place],  [place]  [address] [phone]  Clinic Day:  01/08/2022  Referring physician: [name], MD  ASSESSMENT & PLAN:   Assessment & Plan: Malignant neoplasm of prostate 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 He had been on enzalutamide 160 mg daily in 2018, in addition to leuprolide 22.5 mg, and zoledronic acid, which is now being given every 3 months.  He has been on MS Contin 15 mg twice daily with occasional Tylenol for breakthrough with good control of this pain.  The PSA began to steadily rise and was up to 83.49 in November 2022.  PSMA PET in October 2022 revealed an oligometastasis skeletal lesion it in the anterior right 7th rib, with increased activity, and very subtle sclerotic change at T10, but no evidence of progressive disease.The PSA has continued to fluctuate up and down.  It was down to 31 in February 2023, then back up to 84 in May.  It then increased dramatically to 334 in August.  PSMA PET scan revealed severe progression of disease with several new tiny lung nodules, which did have hypermetabolic activity with SUV up to 5.  There was an increase in the bone lesions, which are now innumerable, including major lesions at L3, the right iliac wing, and the left pubic bone.  The PSA increased to 482 in September.  He is started palliative chemotherapy with docetaxel every 3 weeks with prednisone 5 mg twice daily) in September.  He had difficulty with fatigue, generalized pain and low-grade headaches with his first cycle, for which Tylenol was effective.  His PSA increased slightly to 531 prior to his 2nd cycle of chemotherapy.  He tolerated his 2nd cycle fairly well with fatigue, occasional leg pain and headaches, which he managed with Tylenol.  He will proceed with a 3rd cycle next week.  We will plan to see him back in 3 weeks prior to a 4th  cycle.  Secondary malignant neoplasm of bone and bone marrow ([place]) He continues zoledronic acid every 3 months in addition to docetaxel/prednisone.   The patient understand and his friend the plans discussed today and are in agreement with them.  They know to contact our office if he develops concerns prior to his next appointment.   I provided 15 minutes of face-to-face time during this encounter and > 50% was spent counseling as documented under my assessment and plan.    Marvia Pickles, [place] [address] Dept: [phone] A copy of the whole thing I just want a copy part of it Dept Fax: [phone]   Orders Placed This Encounter  Procedures   CBC and differential    This external order was created through the Results Console.   CBC    This external order was created through the Results Console.   Basic metabolic panel    This external order was created through the Results Console.   Comprehensive metabolic panel    This external order was created through the Results Console.   Hepatic function panel    This external order was created through the Results Console.      CHIEF COMPLAINT:  CC: Metastatic prostate cancer to bone and lung  Current Treatment: Docetaxel/prednisone every 3 weeks with zoledronic acid every 3 months  HISTORY OF PRESENT ILLNESS:  Marvin Perna  Johnson is a 71 year old male with a history of stage IV (T3b N1 M0) prostate cancer diagnosed in January 2016 when he was found to have a PSA of 51.8.  He was treated with a laparoscopic prostatectomy in May 2016.  Pathology revealed adenocarcinoma with a Gleason 8.  There was a positive posterior margin with extraprostatic extension, as well as left retro-urethral tissue positive and extension to the seminal vesicles bilaterally with multiple positive margins.  Two lymph nodes from the right side were negative,  but 2/4 lymph nodes from the left side were positive for metastasis. Repeat bone scan in June 2016 revealed a new lesion at T10, which was not present on his baseline bone scan.  PSA at Dr. [name] office in early August 2016 remained elevated at 56.4.   We began seeing him in August 2016. The PSA was up to 216.3 in late August 2016, so hormonal therapy was recommended  X-rays of the thoracic spine revealed osteoarthritis and scoliosis, as well as evidence of demineralization, but we could not visualize a metastatic lesion.[name] He started leuprolide in September 2016.  The PSA initially dropped steadily with leuprolide.  Bone density scan in November 2016 revealed osteopenia, with a T-score of -2.1 in the spine and a T-score of -0.9 in the femur.  He had been taking vitamin-D 1000 international units daily, but not calcium, so calcium 600 mg twice daily was added at that time.    Leuprolide was held in February 2017 because of chest pain.  EKG was unremarkable.  He went to the [place] and he was referred to the [facility] for placement of 2 coronary artery stents for 95% and 99% occlusions.  He then had a third coronary artery stent placed later in the summer.  Leuprolide was resumed in March 2017.  In September 2017, he had an increase in the PSA, so we repeated a bone scan.  This revealed intense uptake within the right posterior elements of T10, but no other areas of metastasis.  We then added bicalutamide 50 mg daily to his leuprolide.     He had a steadily increasing PSA despite the bicalutamide, so this was discontinued in March 2018.  He eventually was placed on MS Contin 15 mg twice daily, with oxycodone 10 mg every 4 hours as needed for breakthrough pain.  MRI thoracic spine in April 2018 revealed increasing tumor at T10 with epidural spread.  Bone scan at that time revealed increased activity in the T10 lesion, which was stable.  There was a questionable tiny focus in the right ischium/acetabular rim.  Plain  x-rays of the bilateral hips and pelvis did not reveal any focal abnormality. The PSA increased from 27.5 in March, to 72.2 in May, then to 92.2 in June 2018.  He was referred for radiotherapy to the T10 lesion due to the severe pain with improvement in his pain. He was started on zoledronic acid along with his leuprolide in June 2018.  He had a Port-A-Cath placed in anticipation of chemotherapy, but then we recommended that he be placed on enzalutamide 160 mg daily instead, in order to delay the time to initiation of chemotherapy.  He started enzalutamide in June 2018.  The PSA became undetectable in October 2018.  We continued MS Contin, but discontinued the oxycodone. He did not tolerate meloxicam, so uses Tylenol as needed for breakthrough pain.  He had a stress test in January 2020 and was referred to [facility] for coronary artery  stenting, which was done in February.  Zoledronic acid had been given monthly for over 2 years, so was changed to every 3 months in October 2020.   The PSA started to increase in January 2021, at which time it was 0.2.  CT chest, abdomen and pelvis in March 2021 not reveal any lymphadenopathy or soft tissue metastatic disease.  Hepatic steatosis was seen.  There was a stable sclerotic osseous lesion of the T10 vertebral body, as well as a subtle sclerotic lesion of the L3 vertebral body, also possibly a small metastatic lesion.  Whole body bone scan did not reveal continued increased activity of T10 and right acetabulum posteriorly.  The PSA was 0.5 in March.  PSA had increased to 3.0 in June.  Axumin PET imaging in July 2021 did not reveal any evidence of active prostate carcinoma, local recurrence, or metastatic disease. The PSA continued to slowly rise and was up to 5.9 in September, 10.9 in October, and 16.3 in December.     The PSA went up to 23.81 in March 2022.  Axumin PET scan revealed an oligometastasis skeletal lesion in the anterior right 7th rib with  very subtle sclerotic change at this level. PSA in June decreased to 18.6, but then in September increased to 58.  In view of the significant rise in his PSA, a prostate specific PET scan was ordered.  PSMA PET in October 2022 revealed a persistent focus of intense radiotracer activity in the anterior RIGHT seventh rib with increased in activity from comparison exam and subtle sclerotic changes on CT. Findings remained consistent with oligometastatic skeletal metastasis. Sclerotic lesions in the T10 vertebral body without radiotracer activity potentially represented prior treated prostate cancer metastasis.  We recommended he continue his current treatment with enzalutamide, leuprolide and zoledronic acid.  PSA in November continued to increase to 83.49.  Even though his PSA continued to steadily increase, we did not recommend a change in therapy based on this alone.  He had stable pain controlled with medication and good quality of life.  The PSA has continued to fluctuate up and down and was 31 in February and 78 in May.  It increased dramatically in August to 334.  PSMA PET scan then revealed significant progression of disease with several new tiny lung nodules, which did have hypermetabolic activity with SUV up to 5.  There was an increase in the bone lesions, which are now innumerable, including major lesions at L3, the right iliac wing, and the left pubic bone.    He is started palliative chemotherapy with docetaxel every 3 weeks with prednisone 5 mg twice daily) in September, at which time his PSA was 482.  The PSA went up slightly to 531 in October   Oncology History  Malignant neoplasm of prostate ([place])  03/30/2014 Cancer Staging   Staging form: Prostate, AJCC 8th Edition - Clinical stage from 03/30/2014: Stage IVA (cT3b, cN1, cM0, PSA: 51.8, Grade Group: 4) - Signed by [name], MD on 11/14/2020 Histopathologic type: Adenocarcinoma, NOS Stage prefix: Initial diagnosis Prostate specific  antigen (PSA) range: 20 or greater Gleason primary pattern: 4 Gleason secondary pattern: 4 Gleason score: 8 Histologic grading system: 5 grade system Location of positive needle core biopsies: Beyond primary site Prognostic indicators: May 2016 Robotic prostatectomy with mult. Pos margins, extra prostatic extension, 2/6 pos nodes. Scan June 2016 positive at T10  PSA up to 216.3 by August Placed on lupron and casodex Stage used in treatment planning: Yes National guidelines  used in treatment planning: Yes Type of national guideline used in treatment planning: NCCN   02/18/2020 Initial Diagnosis   Malignant neoplasm of prostate ([place])   12/01/2021 -  Chemotherapy   Patient is on Treatment Plan : PROSTATE Docetaxel (75) + Prednisone q21d     Secondary malignant neoplasm of bone and bone marrow ([place])  02/18/2020 Initial Diagnosis   Secondary malignant neoplasm of bone and bone marrow ([place])   12/01/2021 -  Chemotherapy   Patient is on Treatment Plan : PROSTATE Docetaxel (75) + Prednisone q21d         INTERVAL HISTORY:  Lynann Bologna is here today for repeat clinical assessment prior to her 3rd cycle of docetaxel/prednisone.  He also receives pegfilgrastim with each cycle to prevent complications of prolonged neutropenia.  He states he continues prednisone 5 mg twice daily. He denies fevers or chills. He denies pain. His appetite is good. His weight has decreased 7 pounds over last 2 weeks .  REVIEW OF SYSTEMS:  Review of Systems  Constitutional:  Negative for appetite change, chills, fatigue, fever and unexpected weight change.  HENT:   Negative for lump/mass, mouth sores and sore throat.   Respiratory:  Negative for cough and shortness of breath.   Cardiovascular:  Negative for chest pain and leg swelling.  Gastrointestinal:  Negative for abdominal pain, constipation, diarrhea, nausea and vomiting.  Genitourinary:  Negative for difficulty urinating, dysuria, frequency and hematuria.    Musculoskeletal:  Negative for arthralgias, back pain and myalgias.  Skin:  Negative for itching, rash and wound.  Neurological:  Negative for dizziness, extremity weakness, headaches, light-headedness and numbness.  Hematological:  Negative for adenopathy.  Psychiatric/Behavioral:  Negative for depression and sleep disturbance. The patient is not nervous/anxious.      VITALS:  Blood pressure 132/65, pulse 74, temperature 98.8 F (37.1 C), temperature source Oral, resp. rate 20, height 5' 5.5" (1.664 m), weight 186 lb 12.8 oz (84.7 kg), SpO2 95 %.  Wt Readings from Last 3 Encounters:  01/08/22 186 lb 12.8 oz (84.7 kg)  12/24/21 193 lb 8 oz (87.8 kg)  12/22/21 193 lb (87.5 kg)    Body mass index is 30.61 kg/m.  Performance status (ECOG): 1 - Symptomatic but completely ambulatory  PHYSICAL EXAM:  Physical Exam Vitals and nursing note reviewed.  Constitutional:      General: He is not in acute distress.    Appearance: Normal appearance. He is normal weight.  HENT:     Head: Normocephalic and atraumatic.     Mouth/Throat:     Mouth: Mucous membranes are moist.     Pharynx: Oropharynx is clear. No oropharyngeal exudate or posterior oropharyngeal erythema.  Eyes:     General: No scleral icterus.    Extraocular Movements: Extraocular movements intact.     Conjunctiva/sclera: Conjunctivae normal.     Pupils: Pupils are equal, round, and reactive to light.  Cardiovascular:     Rate and Rhythm: Normal rate and regular rhythm.     Heart sounds: Normal heart sounds. No murmur heard.    No friction rub. No gallop.  Pulmonary:     Effort: Pulmonary effort is normal.     Breath sounds: Normal breath sounds. No wheezing, rhonchi or rales.  Abdominal:     General: Bowel sounds are normal. There is no distension.     Palpations: Abdomen is soft. There is no hepatomegaly, splenomegaly or mass.     Tenderness: There is no abdominal tenderness.  Musculoskeletal:  General: Normal  range of motion.     Cervical back: Normal range of motion and neck supple. No tenderness.     Right lower leg: No edema.     Left lower leg: No edema.  Lymphadenopathy:     Cervical: No cervical adenopathy.     Upper Body:     Right upper body: No supraclavicular or axillary adenopathy.     Left upper body: No supraclavicular or axillary adenopathy.     Lower Body: No right inguinal adenopathy. No left inguinal adenopathy.  Skin:    General: Skin is warm and dry.     Coloration: Skin is not jaundiced.     Findings: No rash.  Neurological:     Mental Status: He is alert and oriented to person, place, and time.     Cranial Nerves: No cranial nerve deficit.  Psychiatric:        Mood and Affect: Mood normal.        Behavior: Behavior normal.        Thought Content: Thought content normal.     LABS:      Latest Ref Rng & Units 01/08/2022   12:00 AM 12/18/2021   12:00 AM 12/11/2021   12:00 AM  CBC  WBC  15.3     16.1     41.9      Hemoglobin 13.5 - 17.5 12.0     12.9     12.5      Hematocrit 41 - 53 37     40     39      Platelets 150 - 400 K/uL 150     176     196         This result is from an external source.      Latest Ref Rng & Units 01/08/2022   12:00 AM 12/18/2021   12:00 AM 12/11/2021   12:00 AM  CMP  BUN 4 - '21 24     20     20      '$ Creatinine 0.6 - 1.3 1.0     1.0     0.7      Sodium 137 - 147 141     137     137      Potassium 3.5 - 5.1 mEq/L 4.1     4.2     4.1      Chloride 99 - 108 105     103     102      CO2 13 - '22 30     30     28      '$ Calcium 8.7 - 10.7 9.3     9.6     9.6      Alkaline Phos 25 - 125 89     86     127      AST 14 - 40 26     27     39      ALT 10 - 40 U/L '24     21     29         '$ This result is from an external source.     No results found for: "CEA1", "CEA" / No results found for: "CEA1", "CEA" Lab Results  Component Value Date   PSA1 334.0 (H) 10/30/2021   No results found for: "AJO878" No results found for: "CAN125"  No  results found for: "TOTALPROTELP", "ALBUMINELP", "A1GS", "A2GS", "BETS", "BETA2SER", "  GAMS", "MSPIKE", "SPEI" No results found for: "TIBC", "FERRITIN", "IRONPCTSAT" No results found for: "LDH"  STUDIES:  No results found.    HISTORY:   Past Medical History:  Diagnosis Date   Hyperlipidemia    Malignant neoplasm of prostate ([place])    Supraventricular tachycardia     Past Surgical History:  Procedure Laterality Date   PROSTATECTOMY  07/2014   TONSILLECTOMY      Family History  Problem Relation Age of Onset   Breast cancer Mother 44   Ovarian cancer Mother 79   Breast cancer Paternal Grandmother 29    Social History:  reports that he has never smoked. He has never used smokeless tobacco. He reports that he does not currently use alcohol. He reports that he does not use drugs.The patient is accompanied by his friend, Elta Guadeloupe, today.  Allergies:  Allergies  Allergen Reactions   Atorvastatin    Rosuvastatin Other (See Comments)    Other reaction(s): Muscle pain   Simvastatin Other (See Comments)    Other reaction(s): Joint pain, Muscle pain   Sulfa Antibiotics    Sulfamethoxazole-Trimethoprim Other (See Comments)    Unknown as to interaction was a baby.    Current Medications: Current Outpatient Medications  Medication Sig Dispense Refill   Alirocumab (PRALUENT [place]) Inject into the skin. One injection every 2 weeks     aspirin 81 MG EC tablet Take 81 mg by mouth daily.     Cholecalciferol 25 MCG (1000 UT) tablet Take by mouth.     clopidogrel (PLAVIX) 75 MG tablet Take 75 mg by mouth daily.     dexamethasone (DECADRON) 4 MG tablet TAKE TWO TABLETS BY MOUTH TWICE A DAY (START THE DAY BEFORE CHEMO THEN TAKE DAILY FOR 2 DAYS STARTING THE DAY AFTER CHEMO)     dexamethasone (DECADRON) 4 MG tablet Take 2 tabs by mouth 2 times daily starting day before chemo. Then take 2 tabs daily for 2 days starting day after chemo. Take with food. 30 tablet 1   diphenhydrAMINE (SOMINEX) 25 MG  tablet Take 50 mg by mouth 2 (two) times daily.     ezetimibe (ZETIA) 10 MG tablet Take by mouth. (Patient not taking: Reported on 01/08/2022)     isosorbide mononitrate (IMDUR) 30 MG 24 hr tablet Take 1 tablet by mouth every 8 (eight) hours as needed.     leuprolide (LUPRON) 22.5 MG injection Inject 22.5 mg into the muscle every 3 (three) months.     metoprolol tartrate (LOPRESSOR) 25 MG tablet TAKE ONE-HALF TABLET BY MOUTH TWICE A DAY FOR HEART     morphine (MS CONTIN) 15 MG 12 hr tablet Take 1 tablet (15 mg total) by mouth every 12 (twelve) hours. 60 tablet 0   Multiple Vitamins-Minerals (MULTIVITAMIN WITH MINERALS) tablet Take 1 tablet by mouth daily.     nitroGLYCERIN (NITROSTAT) 0.4 MG SL tablet DISSOLVE ONE TABLET UNDER THE TONGUE EVERY 5 MINUTES AS NEEDED FOR CHEST PAIN (REPEAT EVERY 5 MINUTES IF NEEDED FOR A TOTAL OF 3 TABLETS IN 15 MINUTES. IF NO RELIEF, CALL 911.)     Omega-3 Fatty Acids (FISH OIL) 1000 MG CAPS Take by mouth.     ondansetron (ZOFRAN) 8 MG tablet Take 1 tablet (8 mg total) by mouth every 8 (eight) hours as needed for nausea or vomiting. 30 tablet 1   predniSONE (DELTASONE) 5 MG tablet Take 1 tablet (5 mg total) by mouth in the morning and at bedtime. 60 tablet 11   prochlorperazine (COMPAZINE)  10 MG tablet Take 1 tablet (10 mg total) by mouth every 6 (six) hours as needed for nausea or vomiting. 30 tablet 1   Royal Jelly-Bee Pollen-Ginseng (KOREAN GINSENG COMPLEX) 150-250-50 MG CAPS Take by mouth.     senna (SENOKOT) 8.6 MG TABS tablet Take 1 tablet by mouth.     Zoledronic Acid (ZOMETA) 4 MG/100ML IVPB Inject 4 mg into the vein.     No current facility-administered medications for this visit.

## 2022-01-09 ENCOUNTER — Encounter: Payer: Self-pay | Admitting: Oncology

## 2022-01-09 NOTE — Progress Notes (Signed)
[name]  [address] 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 He has been on enzalutamide 160 mg daily, as well as leuprolide 22.5 mg, and zoledronic acid every 3 months.  His pain is controlled with MS Contin 15 mg twice daily with occasional Tylenol for breakthrough.   He has been on enzalutamide since June 2018.  He then had a teadily rising PSA, up to 83.49 in November 2022.  PSMA PET in October 2022 revealed an oligometastasis skeletal lesion it in the anterior right 7th rib, with increased activity, and very subtle sclerotic change at T10, but no evidence of progressive disease.The PSA has continued to fluctuate up and down.  It was down to 31 in February of 2023, then back up to 3 in May.  It then increased dramatically to 334 in August and he was scheduled for a PSMA PET scan.  This revealed severe progression of disease with several tiny lung nodules which did have hypermetabolic activity with SUV up to 5.  He had increased bone lesions which are now innumerable, including major lesions at L3, the right iliac wing, and the left pubic bone. These were not visible on CT scan. He has started Taxotere chemotherapy in September and is tolerating well.  Secondary malignant neoplasm of bone and bone marrow ([place]) Hel continues zoledronic acid every 3 months in addition to his cancer therapy.  He now has marked progression of his bone metastases but minimal symptoms.  Anemia. This is a change and likely related to the chemotherapy. This went down to 12.5 at his nadir and now 12.9.  Leukocytosis This was due to the Neulasta injection and his white count was up to 40,000 last week, now 16,000.     He had a dramatic change in his bone  metastases and now there are several tiny but suspicious lung lesions.  His PSA had jumped from 78 to 334, which prompted the new scan.  We stopped his enzalutamide and started Taxotere chemotherapy every 3 weeks in September. By 9/13, his PSA was up to 438 and has continued to increase. He had a marked leukocytosis due to the Neulasta. He has mild anemia but says his fatigue is no worse than usual. He will return in 3 weeks with CBC, CMP and PSA and have his 3rd cycle. The patient understands the plans discussed today and is in agreement with them.  He knows to contact our office if he develops concerns prior to his next appointment.  Their questions were answered.   I provided 20 minutes of face-to-face time during this encounter and > 50% was spent counseling as documented under my assessment and plan.    [name], MD  [facility] [address] [place] [address] Dept: [phone] Dept Fax: [phone]   No orders of the defined types were placed in this encounter.      CHIEF COMPLAINT:  CC: Metastatic prostate cancer to bone, progressive  Current Treatment: Leuprolide/zoledronic acid and docetaxel  HISTORY OF PRESENT ILLNESS:  Marvin Johnson is a 71 year old male with a history of stage IV (T3b N1 M0) prostate cancer diagnosed in January 2016 when he was found to have a PSA of 51.8.  He was treated with a laparoscopic prostatectomy in May 2016.  Pathology revealed adenocarcinoma with a Gleason 8.  There was a positive posterior margin with extraprostatic extension, as well as left retro-urethral tissue positive and extension to the seminal vesicles bilaterally with multiple positive margins.  Two lymph nodes from the right side were negative, but 2/4 lymph nodes from the left side were positive for metastasis. Repeat bone scan in June 2016 revealed a new lesion at T10, which was not present on his  baseline bone scan.  PSA at Dr. [name] office in early August 2016 remained elevated at 56.4.   We began seeing him in August 2016. The PSA was up to 216.3 in late August 2016, so hormonal therapy was recommended  X-rays of the thoracic spine revealed osteoarthritis and scoliosis, as well as evidence of demineralization, but we could not visualize a metastatic lesion.[name] He started leuprolide in September 2016.  The PSA initially dropped steadily with leuprolide.  Bone density scan in November 2016 revealed osteopenia, with a T-score of -2.1 in the spine and a T-score of -0.9 in the femur.  He had been taking vitamin-D 1000 international units daily, but not calcium, so calcium 600 mg twice daily was added at that time.    Leuprolide was held in February 2017 because of chest pain.  EKG was unremarkable.  He went to the [place] and he was referred to the [facility] for placement of 2 coronary artery stents for 95% and 99% occlusions.  He then had a third coronary artery stent placed later in the summer.  Leuprolide was resumed in March 2017.  In September 2017, he had an increase in the PSA, so we repeated a bone scan.  This revealed intense uptake within the right posterior elements of T10, but no other areas of metastasis.  We then added bicalutamide 50 mg daily to his leuprolide.     He had a steadily increasing PSA despite the bicalutamide, so this was discontinued in March 2018.  He eventually was placed on MS Contin 15 mg twice daily, with oxycodone 10 mg every 4 hours as needed for breakthrough pain.  MRI thoracic spine in April 2018 revealed increasing tumor at T10 with epidural spread.  Bone scan at that time revealed increased activity in the T10 lesion, which was stable.  There was a questionable tiny focus in the right ischium/acetabular rim.  Plain x-rays of the bilateral hips and pelvis did not reveal any focal abnormality. The PSA increased from 27.5 in March, to 72.2 in May, then to 92.2 in June 2018.   He was referred for radiotherapy to the T10 lesion due to the severe pain with improvement in his pain. He was started on zoledronic acid along with his leuprolide in June 2018.  He had a Port-A-Cath placed in anticipation of chemotherapy, but then we recommended that he be placed on enzalutamide 160 mg daily instead, in order to delay the time to initiation of chemotherapy.  He started enzalutamide in June 2018.  The PSA became undetectable in October 2018.  We continued MS Contin, but discontinued the oxycodone. He did not tolerate meloxicam, so uses Tylenol as needed for breakthrough pain.     He had a stress test in January 2020 and was referred to [facility] for coronary artery stenting, which was done in February.  Zoledronic acid had been given monthly for over 2 years, so was changed to every 3 months in October 2020.  The PSA started to increase in January 2021 at which time it was 0.2.  CT chest, abdomen and pelvis in March 2021 not reveal any lymphadenopathy or soft tissue metastatic disease.  Hepatic steatosis was seen.  There was a stable sclerotic osseous lesion of the T10 vertebral body, as well as a subtle sclerotic lesion of the L3 vertebral body, also possibly a small metastatic lesion.  Whole body bone scan did not reveal continued increased activity of T10 and right acetabulum posteriorly.  The PSA was 0.5 in March.  PSA had increased to 3.0 in June.  Axumin PET imaging from July did not reveal any evidence of active prostate carcinoma, local recurrence, or metastatic disease. The PSA continued to slowly rise and was up to 5.9 in September, 10.9 in October, and 16.3 in December.     The PSA went up to 23.81 in March 2022, so an [facility] PET scan was ordered which revealed an oligometastasis skeletal lesion in the anterior right 7th rib with very subtle sclerotic change at this level. PSA in June decreased to 18.6, but then in September increased to 58.  In view of the  significant rise in his PSA, a prostate specific PET scan was ordered.  PSMA PET in October 2022 revealed a persistent focus of intense radiotracer activity in the anterior RIGHT seventh rib with increased in activity from comparison exam and subtle sclerotic changes on CT. Findings remain consistent with oligometastatic skeletal metastasis. Sclerotic lesions in the T10 vertebral body without radiotracer activity potentially represented prior treated prostate cancer metastasis.  We recommended he continue his current treatment with enzalutamide, leuprolide and zoledronic acid.  PSA in November continued to increase to 83.49.  Even though his PSA continued to steadily increase, we did not recommend a change in therapy based on this alone.  He had no severe pain and no increase in pain and was having a good quality of life.  The PSA has continued to fluctuate up and down and was 31 in February and 78 in May.   Oncology History  Malignant neoplasm of prostate ([place])  03/30/2014 Cancer Staging   Staging form: Prostate, AJCC 8th Edition - Clinical stage from 03/30/2014: Stage IVA (cT3b, cN1, cM0, PSA: 51.8, Grade Group: 4) - Signed by [name], MD on 11/14/2020 Histopathologic type: Adenocarcinoma, NOS Stage prefix: Initial diagnosis Prostate specific antigen (PSA) range: 20 or greater Gleason primary pattern: 4 Gleason secondary pattern: 4 Gleason score: 8 Histologic grading system: 5 grade system Location of positive needle core biopsies: Beyond primary site Prognostic indicators: May 2016 Robotic prostatectomy with mult. Pos margins, extra prostatic extension, 2/6 pos nodes. Scan June 2016 positive at T10  PSA up to 216.3 by August Placed on lupron and casodex Stage used in treatment planning: Yes National guidelines used in treatment planning: Yes Type of national guideline used in treatment planning: NCCN   02/18/2020 Initial Diagnosis   Malignant neoplasm of prostate ([place])    12/01/2021 -  Chemotherapy   Patient is on Treatment Plan : PROSTATE Docetaxel (75) + Prednisone q21d     Secondary malignant neoplasm of bone and bone marrow ([place])  02/18/2020 Initial Diagnosis   Secondary malignant neoplasm of bone and bone marrow ([place])   12/01/2021 -  Chemotherapy   Patient is on Treatment Plan : PROSTATE Docetaxel (75) + Prednisone q21d         INTERVAL HISTORY:  Cope is here today for repeat clinical assessment prior to his 2nd cycle of  Taxotere.  He had been on the enzalutamide for 5 years and it had kept his disease stable.  However his most recent PSA increased from 78.11 to 334.0 and so I ordered a PSMA PET scan. This revealed severe progression of disease with several tiny lung nodules which did have hypermetabolic activity with SUV up to 5.  He had increased bone lesions which are now innumerable, including major lesions at L3, the right iliac wing, and the left pubic bone. These were not visible on CT scan.  We stopped the enzalutamide and started him on Taxotere chemotherapy. By the time we started, his PSA was up to 438 on 9/13,  and has continued to steadily increase. He does note some fatigue.  He denies increasing pain.  He still remains on MS Contin 15 mg every 12 hours and only requires Tylenol for breakthrough pain.  He denies new symptoms.  He denies fevers or chills. He denies pain. His appetite is great. His hemoglobin has decreased from 13.6 to 12.5, likely from the chemotherapy, and today is 12.9. His WBC's increased to 41.900, from the Neulasta, but are back down to 16,000 today. His only complaint is constipation.  REVIEW OF SYSTEMS:  Review of Systems  Constitutional:  Negative for appetite change, chills, fever and unexpected weight change. Fatigue: Mild. HENT:   Negative for lump/mass, mouth sores and sore throat.   Respiratory:  Negative for cough and shortness of breath.   Cardiovascular:  Negative for chest pain and leg swelling.   Gastrointestinal:  Negative for abdominal pain, constipation, diarrhea, nausea and vomiting.  Genitourinary:  Negative for difficulty urinating, dysuria, frequency and hematuria.   Musculoskeletal:  Negative for arthralgias, back pain and myalgias.  Skin:  Negative for itching, rash and wound.  Neurological:  Negative for dizziness, extremity weakness, headaches, light-headedness and numbness.  Hematological:  Negative for adenopathy.  Psychiatric/Behavioral:  Negative for depression and sleep disturbance. The patient is not nervous/anxious.      VITALS:  Blood pressure (!) 146/69, pulse (!) 54, temperature 97.8 F (36.6 C), temperature source Oral, resp. rate 16, height 5' 5.5" (1.664 m), weight 189 lb 12.8 oz (86.1 kg), SpO2 95 %.  Wt Readings from Last 3 Encounters:  01/08/22 186 lb 12.8 oz (84.7 kg)  12/24/21 193 lb 8 oz (87.8 kg)  12/22/21 193 lb (87.5 kg)    Body mass index is 31.1 kg/m.  Performance status (ECOG): 1 - Symptomatic but completely ambulatory  PHYSICAL EXAM:  Physical Exam Vitals and nursing note reviewed.  Constitutional:      General: He is not in acute distress.    Appearance: Normal appearance. He is normal weight.  HENT:     Head: Normocephalic and atraumatic.     Mouth/Throat:     Mouth: Mucous membranes are moist.     Pharynx: Oropharynx is clear. No oropharyngeal exudate or posterior oropharyngeal erythema.  Eyes:     General: No scleral icterus.    Extraocular Movements: Extraocular movements intact.     Conjunctiva/sclera: Conjunctivae normal.     Pupils: Pupils are equal, round, and reactive to light.  Cardiovascular:     Rate and Rhythm: Normal rate and regular rhythm.     Heart sounds: Normal heart sounds. No murmur heard.    No friction rub. No gallop.  Pulmonary:     Effort: Pulmonary effort is normal.     Breath sounds: Normal breath sounds. No wheezing, rhonchi or rales.  Abdominal:  General: Bowel sounds are normal. There is no  distension.     Palpations: Abdomen is soft. There is no hepatomegaly, splenomegaly or mass.     Tenderness: There is no abdominal tenderness.  Musculoskeletal:        General: Normal range of motion.     Cervical back: Normal range of motion and neck supple. No tenderness.     Right lower leg: No edema.     Left lower leg: No edema.  Lymphadenopathy:     Cervical: No cervical adenopathy.     Upper Body:     Right upper body: No supraclavicular or axillary adenopathy.     Left upper body: No supraclavicular or axillary adenopathy.     Lower Body: No right inguinal adenopathy. No left inguinal adenopathy.  Skin:    General: Skin is warm and dry.     Coloration: Skin is not jaundiced.     Findings: No rash.  Neurological:     Mental Status: He is alert and oriented to person, place, and time.     Cranial Nerves: No cranial nerve deficit.  Psychiatric:        Mood and Affect: Mood normal.        Behavior: Behavior normal.        Thought Content: Thought content normal.    LABS:      Latest Ref Rng & Units 01/08/2022   12:00 AM 12/18/2021   12:00 AM 12/11/2021   12:00 AM  CBC  WBC  15.3     16.1     41.9      Hemoglobin 13.5 - 17.5 12.0     12.9     12.5      Hematocrit 41 - 53 37     40     39      Platelets 150 - 400 K/uL 150     176     196         This result is from an external source.      Latest Ref Rng & Units 01/08/2022   12:00 AM 12/18/2021   12:00 AM 12/11/2021   12:00 AM  CMP  BUN 4 - '21 24     20     20      '$ Creatinine 0.6 - 1.3 1.0     1.0     0.7      Sodium 137 - 147 141     137     137      Potassium 3.5 - 5.1 mEq/L 4.1     4.2     4.1      Chloride 99 - 108 105     103     102      CO2 13 - '22 30     30     28      '$ Calcium 8.7 - 10.7 9.3     9.6     9.6      Alkaline Phos 25 - 125 89     86     127      AST 14 - 40 26     27     39      ALT 10 - 40 U/L '24     21     29         '$ This result is from an external source.     No results found for:  "CEA1", "CEA" /  No results found for: "CEA1", "CEA" Lab Results  Component Value Date   PSA1 334.0 (H) 10/30/2021   No results found for: "UTM546" No results found for: "CAN125"  No results found for: "TOTALPROTELP", "ALBUMINELP", "A1GS", "A2GS", "BETS", "BETA2SER", "GAMS", "MSPIKE", "SPEI" No results found for: "TIBC", "FERRITIN", "IRONPCTSAT" No results found for: "LDH"  STUDIES:  No results found.    HISTORY:   Past Medical History:  Diagnosis Date   Hyperlipidemia    Malignant neoplasm of prostate ([place])    Supraventricular tachycardia     Past Surgical History:  Procedure Laterality Date   PROSTATECTOMY  07/2014   TONSILLECTOMY      Family History  Problem Relation Age of Onset   Breast cancer Mother 99   Ovarian cancer Mother 80   Breast cancer Paternal Grandmother 38    Social History:  reports that he has never smoked. He has never used smokeless tobacco. He reports that he does not currently use alcohol. He reports that he does not use drugs.The patient is alone today.  Allergies:  Allergies  Allergen Reactions   Atorvastatin    Rosuvastatin Other (See Comments)    Other reaction(s): Muscle pain   Simvastatin Other (See Comments)    Other reaction(s): Joint pain, Muscle pain   Sulfa Antibiotics    Sulfamethoxazole-Trimethoprim Other (See Comments)    Unknown as to interaction was a baby.    Current Medications: Current Outpatient Medications  Medication Sig Dispense Refill   Alirocumab (PRALUENT [place]) Inject into the skin. One injection every 2 weeks     aspirin 81 MG EC tablet Take 81 mg by mouth daily.     Cholecalciferol 25 MCG (1000 UT) tablet Take by mouth.     clopidogrel (PLAVIX) 75 MG tablet Take 75 mg by mouth daily.     dexamethasone (DECADRON) 4 MG tablet TAKE TWO TABLETS BY MOUTH TWICE A DAY (START THE DAY BEFORE CHEMO THEN TAKE DAILY FOR 2 DAYS STARTING THE DAY AFTER CHEMO)     dexamethasone (DECADRON) 4 MG tablet Take 2 tabs by mouth 2  times daily starting day before chemo. Then take 2 tabs daily for 2 days starting day after chemo. Take with food. 30 tablet 1   diphenhydrAMINE (SOMINEX) 25 MG tablet Take 50 mg by mouth 2 (two) times daily.     ezetimibe (ZETIA) 10 MG tablet Take by mouth. (Patient not taking: Reported on 01/08/2022)     isosorbide mononitrate (IMDUR) 30 MG 24 hr tablet Take 1 tablet by mouth every 8 (eight) hours as needed.     leuprolide (LUPRON) 22.5 MG injection Inject 22.5 mg into the muscle every 3 (three) months.     metoprolol tartrate (LOPRESSOR) 25 MG tablet TAKE ONE-HALF TABLET BY MOUTH TWICE A DAY FOR HEART     morphine (MS CONTIN) 15 MG 12 hr tablet Take 1 tablet (15 mg total) by mouth every 12 (twelve) hours. 60 tablet 0   Multiple Vitamins-Minerals (MULTIVITAMIN WITH MINERALS) tablet Take 1 tablet by mouth daily.     nitroGLYCERIN (NITROSTAT) 0.4 MG SL tablet DISSOLVE ONE TABLET UNDER THE TONGUE EVERY 5 MINUTES AS NEEDED FOR CHEST PAIN (REPEAT EVERY 5 MINUTES IF NEEDED FOR A TOTAL OF 3 TABLETS IN 15 MINUTES. IF NO RELIEF, CALL 911.)     Omega-3 Fatty Acids (FISH OIL) 1000 MG CAPS Take by mouth.     ondansetron (ZOFRAN) 8 MG tablet Take 1 tablet (8 mg total) by mouth every 8 (eight) hours  as needed for nausea or vomiting. 30 tablet 1   predniSONE (DELTASONE) 5 MG tablet Take 1 tablet (5 mg total) by mouth in the morning and at bedtime. 60 tablet 11   prochlorperazine (COMPAZINE) 10 MG tablet Take 1 tablet (10 mg total) by mouth every 6 (six) hours as needed for nausea or vomiting. 30 tablet 1   Royal Jelly-Bee Pollen-Ginseng (KOREAN GINSENG COMPLEX) 150-250-50 MG CAPS Take by mouth.     senna (SENOKOT) 8.6 MG TABS tablet Take 1 tablet by mouth.     Zoledronic Acid (ZOMETA) 4 MG/100ML IVPB Inject 4 mg into the vein.     No current facility-administered medications for this visit.

## 2022-01-11 MED FILL — Docetaxel Soln for IV Infusion 160 MG/16ML: INTRAVENOUS | Qty: 12 | Status: AC

## 2022-01-11 MED FILL — Dexamethasone Sodium Phosphate Inj 100 MG/10ML: INTRAMUSCULAR | Qty: 1 | Status: AC

## 2022-01-12 ENCOUNTER — Inpatient Hospital Stay: Payer: No Typology Code available for payment source

## 2022-01-12 VITALS — BP 137/71 | HR 61 | Temp 97.5°F | Resp 18 | Ht 65.5 in | Wt 191.2 lb

## 2022-01-12 DIAGNOSIS — C7951 Secondary malignant neoplasm of bone: Secondary | ICD-10-CM

## 2022-01-12 DIAGNOSIS — Z5111 Encounter for antineoplastic chemotherapy: Secondary | ICD-10-CM | POA: Diagnosis not present

## 2022-01-12 DIAGNOSIS — C61 Malignant neoplasm of prostate: Secondary | ICD-10-CM

## 2022-01-12 MED ORDER — HEPARIN SOD (PORK) LOCK FLUSH 100 UNIT/ML IV SOLN
500.0000 [IU] | Freq: Once | INTRAVENOUS | Status: AC | PRN
  Administered 2022-01-12: 500 [IU]

## 2022-01-12 MED ORDER — SODIUM CHLORIDE 0.9 % IV SOLN: Freq: Once | INTRAVENOUS | Status: AC

## 2022-01-12 MED ORDER — SODIUM CHLORIDE 0.9 % IV SOLN
60.0000 mg/m2 | Freq: Once | INTRAVENOUS | Status: AC
  Administered 2022-01-12: 120 mg via INTRAVENOUS
  Filled 2022-01-12: qty 12

## 2022-01-12 MED ORDER — SODIUM CHLORIDE 0.9% FLUSH
10.0000 mL | INTRAVENOUS | Status: DC | PRN
  Administered 2022-01-12: 10 mL

## 2022-01-12 MED ORDER — SODIUM CHLORIDE 0.9 % IV SOLN
10.0000 mg | Freq: Once | INTRAVENOUS | Status: AC
  Administered 2022-01-12: 10 mg via INTRAVENOUS
  Filled 2022-01-12: qty 10

## 2022-01-12 NOTE — Patient Instructions (Addendum)
Docetaxel Injection What is this medication? DOCETAXEL (doe se TAX el) treats some types of cancer. It works by slowing down the growth of cancer cells. This medicine may be used for other purposes; ask your health care provider or pharmacist if you have questions. COMMON BRAND NAME(S): Docefrez, Taxotere What should I tell my care team before I take this medication? They need to know if you have any of these conditions: Kidney disease Liver disease Low white blood cell levels Tingling of the fingers or toes or other nerve disorder An unusual or allergic reaction to docetaxel, polysorbate 80, other medications, foods, dyes, or preservatives Pregnant or trying to get pregnant Breast-feeding How should I use this medication? This medication is injected into a vein. It is given by your care team in a hospital or clinic setting. Talk to your care team about the use of this medication in children. Special care may be needed. Overdosage: If you think you have taken too much of this medicine contact a poison control center or emergency room at once. NOTE: This medicine is only for you. Do not share this medicine with others. What if I miss a dose? Keep appointments for follow-up doses. It is important not to miss your dose. Call your care team if you are unable to keep an appointment. What may interact with this medication? Do not take this medication with any of the following: Live virus vaccines This medication may also interact with the following: Certain antibiotics, such as clarithromycin, telithromycin Certain antivirals for HIV or hepatitis Certain medications for fungal infections, such as itraconazole, ketoconazole, voriconazole Grapefruit juice Nefazodone Supplements, such as St. John's wort This list may not describe all possible interactions. Give your health care provider a list of all the medicines, herbs, non-prescription drugs, or dietary supplements you use. Also tell them if  you smoke, drink alcohol, or use illegal drugs. Some items may interact with your medicine. What should I watch for while using this medication? This medication may make you feel generally unwell. This is not uncommon as chemotherapy can affect healthy cells as well as cancer cells. Report any side effects. Continue your course of treatment even though you feel ill unless your care team tells you to stop. You may need blood work done while you are taking this medication. This medication can cause serious side effects and infusion reactions. To reduce the risk, your care team may give you other medications to take before receiving this one. Be sure to follow the directions from your care team. This medication may increase your risk of getting an infection. Call your care team for advice if you get a fever, chills, sore throat, or other symptoms of a cold or flu. Do not treat yourself. Try to avoid being around people who are sick. Avoid taking medications that contain aspirin, acetaminophen, ibuprofen, naproxen, or ketoprofen unless instructed by your care team. These medications may hide a fever. Be careful brushing or flossing your teeth or using a toothpick because you may get an infection or bleed more easily. If you have any dental work done, tell your dentist you are receiving this medication. Some products may contain alcohol. Ask your care team if this medication contains alcohol. Be sure to tell all care teams you are taking this medicine. Certain medications, like metronidazole and disulfiram, can cause an unpleasant reaction when taken with alcohol. The reaction includes flushing, headache, nausea, vomiting, sweating, and increased thirst. The reaction can last from 30 minutes to several hours.  This medication may affect your coordination, reaction time, or judgement. Do not drive or operate machinery until you know how this medication affects you. Sit up or stand slowly to reduce the risk of  dizzy or fainting spells. Drinking alcohol with this medication can increase the risk of these side effects. Talk to your care team about your risk of cancer. You may be more at risk for certain types of cancer if you take this medication. Talk to your care team if you wish to become pregnant or think you might be pregnant. This medication can cause serious birth defects if taken during pregnancy or if you get pregnant within 2 months after stopping therapy. A negative pregnancy test is required before starting this medication. A reliable form of contraception is recommended while taking this medication and for 2 months after stopping it. Talk to your care team about reliable forms of contraception. Do not breast-feed while taking this medication and for 1 week after stopping therapy. Use a condom during sex and for 4 months after stopping therapy. Tell your care team right away if you think your partner might be pregnant. This medication can cause serious birth defects. This medication may cause infertility. Talk to your care team if you are concerned about your fertility. What side effects may I notice from receiving this medication? Side effects that you should report to your care team as soon as possible: Allergic reactions--skin rash, itching, hives, swelling of the face, lips, tongue, or throat Change in vision such as blurry vision, seeing halos around lights, vision loss Infection--fever, chills, cough, or sore throat Infusion reactions--chest pain, shortness of breath or trouble breathing, feeling faint or lightheaded Low red blood cell level--unusual weakness or fatigue, dizziness, headache, trouble breathing Pain, tingling, or numbness in the hands or feet Painful swelling, warmth, or redness of the skin, blisters or sores at the infusion site Redness, blistering, peeling, or loosening of the skin, including inside the mouth Sudden or severe stomach pain, bloody diarrhea, fever, nausea,  vomiting Swelling of the ankles, hands, or feet Tumor lysis syndrome (TLS)--nausea, vomiting, diarrhea, decrease in the amount of urine, dark urine, unusual weakness or fatigue, confusion, muscle pain or cramps, fast or irregular heartbeat, joint pain Unusual bruising or bleeding Side effects that usually do not require medical attention (report to your care team if they continue or are bothersome): Change in nail shape, thickness, or color Change in taste Hair loss Increased tears This list may not describe all possible side effects. Call your doctor for medical advice about side effects. You may report side effects to FDA at 1-800-FDA-1088. Where should I keep my medication? This medication is given in a hospital or clinic. It will not be stored at home. NOTE: This sheet is a summary. It may not cover all possible information. If you have questions about this medicine, talk to your doctor, pharmacist, or health care provider.  2023 Elsevier/Gold Standard (2007-04-22 00:00:00)   Marvin Johnson  Discharge Instructions: Thank you for choosing [facility] to provide your oncology and hematology care.  If you have a lab appointment with the [facility], please go directly to the [facility] and check in at the registration area.   Wear comfortable clothing and clothing appropriate for easy access to any Portacath or PICC line.   We strive to give you quality time with your provider. You may need to reschedule your appointment if you arrive late (15 or more minutes).  Arriving late affects you and other patients whose appointments are after yours.  Also, if you miss three or more appointments without notifying the office, you may be dismissed from the clinic at the provider's discretion.      For prescription refill requests, have your pharmacy contact our office and allow 72 hours for refills to be completed.    Today you received the following  chemotherapy and/or immunotherapy agents DOCETAXEL      To help prevent nausea and vomiting after your treatment, we encourage you to take your nausea medication as directed.  BELOW ARE SYMPTOMS THAT SHOULD BE REPORTED IMMEDIATELY: *FEVER GREATER THAN 100.4 F (38 C) OR HIGHER *CHILLS OR SWEATING *NAUSEA AND VOMITING THAT IS NOT CONTROLLED WITH YOUR NAUSEA MEDICATION *UNUSUAL SHORTNESS OF BREATH *UNUSUAL BRUISING OR BLEEDING *URINARY PROBLEMS (pain or burning when urinating, or frequent urination) *BOWEL PROBLEMS (unusual diarrhea, constipation, pain near the anus) TENDERNESS IN MOUTH AND THROAT WITH OR WITHOUT PRESENCE OF ULCERS (sore throat, sores in mouth, or a toothache) UNUSUAL RASH, SWELLING OR PAIN  UNUSUAL VAGINAL DISCHARGE OR ITCHING   Items with * indicate a potential emergency and should be followed up as soon as possible or go to the Emergency Department if any problems should occur.  Please show the CHEMOTHERAPY ALERT CARD or IMMUNOTHERAPY ALERT CARD at check-in to the Emergency Department and triage nurse.  Should you have questions after your visit or need to cancel or reschedule your appointment, please contact [facility]  Dept: [phone]  and follow the prompts.  Office hours are 8:00 a.m. to 4:30 p.m. Monday - Friday. Please note that voicemails left after 4:00 p.m. may not be returned until the following business day.  We are closed weekends and major holidays. You have access to a nurse at all times for urgent questions. Please call the main number to the clinic Dept: [phone] and follow the prompts.  For any non-urgent questions, you may also contact your provider using MyChart. We now offer e-Visits for anyone 75 and older to request care online for non-urgent symptoms. For details visit [email or web address].   Also download the MyChart app! Go to the app store, search "MyChart", open the app, select [facility], and log in with your  MyChart username and password.  Masks are optional in the cancer centers. If you would like for your care team to wear a mask while they are taking care of you, please let them know. You may have one support person who is at least 73 years old accompany you for your appointments.

## 2022-01-14 ENCOUNTER — Inpatient Hospital Stay: Payer: No Typology Code available for payment source | Attending: Hematology and Oncology

## 2022-01-14 VITALS — BP 136/62 | HR 63 | Temp 97.6°F | Resp 18 | Ht 65.5 in | Wt 193.5 lb

## 2022-01-14 DIAGNOSIS — Z5111 Encounter for antineoplastic chemotherapy: Secondary | ICD-10-CM | POA: Diagnosis present

## 2022-01-14 DIAGNOSIS — C7951 Secondary malignant neoplasm of bone: Secondary | ICD-10-CM | POA: Diagnosis not present

## 2022-01-14 DIAGNOSIS — C61 Malignant neoplasm of prostate: Secondary | ICD-10-CM | POA: Diagnosis not present

## 2022-01-14 MED ORDER — PEGFILGRASTIM-CBQV 6 MG/0.6ML ~~LOC~~ SOSY
6.0000 mg | PREFILLED_SYRINGE | Freq: Once | SUBCUTANEOUS | Status: AC
  Administered 2022-01-14: 6 mg via SUBCUTANEOUS
  Filled 2022-01-14: qty 0.6

## 2022-01-14 NOTE — Patient Instructions (Signed)

## 2022-01-26 ENCOUNTER — Other Ambulatory Visit: Payer: Self-pay

## 2022-01-26 DIAGNOSIS — C7951 Secondary malignant neoplasm of bone: Secondary | ICD-10-CM

## 2022-01-26 DIAGNOSIS — C61 Malignant neoplasm of prostate: Secondary | ICD-10-CM

## 2022-01-26 MED ORDER — MORPHINE SULFATE ER 15 MG PO TBCR: 15.0000 mg | EXTENDED_RELEASE_TABLET | Freq: Two times a day (BID) | ORAL | 0 refills | Status: DC

## 2022-01-29 ENCOUNTER — Inpatient Hospital Stay: Payer: No Typology Code available for payment source

## 2022-01-29 ENCOUNTER — Inpatient Hospital Stay (INDEPENDENT_AMBULATORY_CARE_PROVIDER_SITE_OTHER): Payer: No Typology Code available for payment source | Admitting: Oncology

## 2022-01-29 ENCOUNTER — Other Ambulatory Visit: Payer: Self-pay

## 2022-01-29 ENCOUNTER — Encounter: Payer: Self-pay | Admitting: Oncology

## 2022-01-29 ENCOUNTER — Other Ambulatory Visit: Payer: Self-pay | Admitting: Oncology

## 2022-01-29 VITALS — BP 132/62 | HR 63 | Temp 97.3°F | Resp 19 | Ht 65.5 in | Wt 194.1 lb

## 2022-01-29 DIAGNOSIS — C7800 Secondary malignant neoplasm of unspecified lung: Secondary | ICD-10-CM

## 2022-01-29 DIAGNOSIS — D61810 Antineoplastic chemotherapy induced pancytopenia: Secondary | ICD-10-CM | POA: Diagnosis not present

## 2022-01-29 DIAGNOSIS — Z5111 Encounter for antineoplastic chemotherapy: Secondary | ICD-10-CM | POA: Diagnosis not present

## 2022-01-29 DIAGNOSIS — C61 Malignant neoplasm of prostate: Secondary | ICD-10-CM | POA: Diagnosis not present

## 2022-01-29 DIAGNOSIS — T451X5A Adverse effect of antineoplastic and immunosuppressive drugs, initial encounter: Secondary | ICD-10-CM

## 2022-01-29 DIAGNOSIS — C7951 Secondary malignant neoplasm of bone: Secondary | ICD-10-CM

## 2022-01-29 DIAGNOSIS — C7952 Secondary malignant neoplasm of bone marrow: Secondary | ICD-10-CM

## 2022-01-29 LAB — IRON AND TIBC
Iron: 101 ug/dL (ref 45–182)
Saturation Ratios: 25 % (ref 17.9–39.5)
TIBC: 398 ug/dL (ref 250–450)
UIBC: 297 ug/dL

## 2022-01-29 LAB — FERRITIN: Ferritin: 206 ng/mL (ref 24–336)

## 2022-01-29 LAB — CMP (CANCER CENTER ONLY)
ALT: 22 U/L (ref 0–44)
AST: 19 U/L (ref 15–41)
Albumin: 3.7 g/dL (ref 3.5–5.0)
Alkaline Phosphatase: 93 U/L (ref 38–126)
Anion gap: 8 (ref 5–15)
BUN: 19 mg/dL (ref 8–23)
CO2: 28 mmol/L (ref 22–32)
Calcium: 9.1 mg/dL (ref 8.9–10.3)
Chloride: 106 mmol/L (ref 98–111)
Creatinine: 0.87 mg/dL (ref 0.61–1.24)
GFR, Estimated: >60 mL/min (ref >60)
Glucose, Bld: 101 mg/dL — ABNORMAL HIGH (ref 70–99)
Potassium: 3.6 mmol/L (ref 3.5–5.1)
Sodium: 142 mmol/L (ref 135–145)
Total Bilirubin: 0.3 mg/dL (ref 0.3–1.2)
Total Protein: 6.7 g/dL (ref 6.5–8.1)

## 2022-01-29 LAB — CBC AND DIFFERENTIAL
HCT: 35 — AB (ref 41–53)
Hemoglobin: 11.4 — AB (ref 13.5–17.5)
Neutrophils Absolute: 18.33
Platelets: 176 10*3/uL (ref 150–400)
WBC: 23.5

## 2022-01-29 LAB — CBC: RBC: 3.87 (ref 3.87–5.11)

## 2022-01-29 LAB — FOLATE: Folate: 28.4 ng/mL (ref >5.9)

## 2022-01-29 LAB — VITAMIN B12: Vitamin B-12: >7500 pg/mL — ABNORMAL HIGH (ref 180–914)

## 2022-01-29 LAB — PSA: Prostatic Specific Antigen: 400.75 ng/mL — ABNORMAL HIGH (ref 0.00–4.00)

## 2022-01-29 MED FILL — Docetaxel Soln for IV Infusion 160 MG/16ML: INTRAVENOUS | Qty: 12 | Status: AC

## 2022-01-29 MED FILL — Dexamethasone Sodium Phosphate Inj 100 MG/10ML: INTRAMUSCULAR | Qty: 1 | Status: AC

## 2022-01-29 NOTE — Progress Notes (Signed)
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 He has been on enzalutamide 160 mg daily, as well as leuprolide 22.5 mg, and zoledronic acid every 3 months.  His pain is controlled with MS Contin 15 mg twice daily with occasional Tylenol for breakthrough.   He has been on enzalutamide since June 2018.  He then had a teadily rising PSA, up to 83.49 in November 2022.  PSMA PET in October 2022 revealed an oligometastasis skeletal lesion it in the anterior right 7th rib, with increased activity, and very subtle sclerotic change at T10, but no evidence of progressive disease.The PSA has continued to fluctuate up and down.  It was down to 31 in February of 2023, then back up to 41 in May.  It then increased dramatically to 334 in August and he was scheduled for a PSMA PET scan.  This revealed severe progression of disease with several tiny lung nodules which did have hypermetabolic activity with SUV up to 5.  He had increased bone lesions which are now innumerable, including major lesions at L3, the right iliac wing, and the left pubic bone. These were not visible on CT scan. He started Taxotere chemotherapy in September 2023 and is tolerating well. His PSA continued to increase by approximately 100 points for September and October but the last one only went up by 5 points so hopefully he is stabilizing. This time his PSA went down from 536 to 400.75 after 3 cycles.  Secondary malignant neoplasm of bone and bone marrow ([place]) He continues zoledronic acid every 3 months in addition to his cancer therapy.  He now has marked progression of his bone metastases but minimal symptoms.  Anemia. This is a change and likely related to the  chemotherapy. This has gone down to 11.4 from 12.0.  Leukocytosis This was due to the Neulasta injection and his white count was up to 40,000 last week, then decreased to 16,000. Now is 23,500.     Plan:  He had a dramatic change in his bone metastases and now there are several tiny but suspicious lung lesions.  His PSA had jumped from 78 to 334, which prompted the new scan.  We stopped his enzalutamide and started Taxotere chemotherapy every 3 weeks in September, and will have his 4th cycle this week. By 9/13, his PSA was up to 438 and has continued to increase to 536. Today's PSA is pending. He has worsening anemia but says his fatigue is no worse than usual. He will return in 3 weeks with CBC, CMP and PSA and have his 5th cycle. The patient understands the plans discussed today and is in agreement with them.  He knows to contact our office if he develops concerns prior to his next appointment.   His questions were answered.   I provided 25 minutes of face-to-face time during this encounter and > 50% was spent counseling as documented under my assessment and plan.   ADDENDUM: His PSA has now decreased from 536 to 400.75.   [name], MD  [facility] [address] [place] [address] Dept: [phone] Dept Fax: [phone]   Orders Placed This Encounter  Procedures   CBC and  differential    This external order was created through the Results Console.   CBC    This external order was created through the Results Console.   I,Alexis Herring,acting as a scribe for [name], MD.,have documented all relevant documentation on the behalf of [name], MD,as directed by  [name], MD while in the presence of [name], MD.  I have reviewed this report as typed by the medical scribe, and it is complete and accurate.  [name]   02/18/22  7:20 AM     CHIEF  COMPLAINT:  CC: Metastatic prostate cancer to bone, progressive  Current Treatment: Leuprolide/zoledronic acid and docetaxel  HISTORY OF PRESENT ILLNESS:  Marvin Johnson is a 71 year old male with a history of stage IV (T3b N1 M0) prostate cancer diagnosed in January 2016 when he was found to have a PSA of 51.8.  He was treated with a laparoscopic prostatectomy in May 2016.  Pathology revealed adenocarcinoma with a Gleason 8.  There was a positive posterior margin with extraprostatic extension, as well as left retro-urethral tissue positive and extension to the seminal vesicles bilaterally with multiple positive margins.  Two lymph nodes from the right side were negative, but 2/4 lymph nodes from the left side were positive for metastasis. Repeat bone scan in June 2016 revealed a new lesion at T10, which was not present on his baseline bone scan.  PSA at Dr. [name] office in early August 2016 remained elevated at 56.4.   We began seeing him in August 2016. The PSA was up to 216.3 in late August 2016, so hormonal therapy was recommended  X-rays of the thoracic spine revealed osteoarthritis and scoliosis, as well as evidence of demineralization, but we could not visualize a metastatic lesion.[name] He started leuprolide in September 2016.  The PSA initially dropped steadily with leuprolide.  Bone density scan in November 2016 revealed osteopenia, with a T-score of -2.1 in the spine and a T-score of -0.9 in the femur.  He had been taking vitamin-D 1000 international units daily, but not calcium, so calcium 600 mg twice daily was added at that time.    Leuprolide was held in February 2017 because of chest pain.  EKG was unremarkable.  He went to the [place] and he was referred to the [facility] for placement of 2 coronary artery stents for 95% and 99% occlusions.  He then had a third coronary artery stent placed later in the summer.  Leuprolide was resumed in March 2017.  In September 2017, he had an increase in the  PSA, so we repeated a bone scan.  This revealed intense uptake within the right posterior elements of T10, but no other areas of metastasis.  We then added bicalutamide 50 mg daily to his leuprolide.     He had a steadily increasing PSA despite the bicalutamide, so this was discontinued in March 2018.  He eventually was placed on MS Contin 15 mg twice daily, with oxycodone 10 mg every 4 hours as needed for breakthrough pain.  MRI thoracic spine in April 2018 revealed increasing tumor at T10 with epidural spread.  Bone scan at that time revealed increased activity in the T10 lesion, which was stable.  There was a questionable tiny focus in the right ischium/acetabular rim.  Plain x-rays of the bilateral hips and pelvis did not reveal any focal abnormality. The PSA increased from 27.5 in March, to 72.2 in May, then to 92.2 in June  2018.  He was referred for radiotherapy to the T10 lesion due to the severe pain with improvement in his pain. He was started on zoledronic acid along with his leuprolide in June 2018.  He had a Port-A-Cath placed in anticipation of chemotherapy, but then we recommended that he be placed on enzalutamide 160 mg daily instead, in order to delay the time to initiation of chemotherapy.  He started enzalutamide in June 2018.  The PSA became undetectable in October 2018.  We continued MS Contin, but discontinued the oxycodone. He did not tolerate meloxicam, so uses Tylenol as needed for breakthrough pain.     He had a stress test in January 2020 and was referred to [facility] for coronary artery stenting, which was done in February.  Zoledronic acid had been given monthly for over 2 years, so was changed to every 3 months in October 2020.   The PSA started to increase in January 2021 at which time it was 0.2.  CT chest, abdomen and pelvis in March 2021 not reveal any lymphadenopathy or soft tissue metastatic disease.  Hepatic steatosis was seen.  There was a stable sclerotic  osseous lesion of the T10 vertebral body, as well as a subtle sclerotic lesion of the L3 vertebral body, also possibly a small metastatic lesion.  Whole body bone scan did not reveal continued increased activity of T10 and right acetabulum posteriorly.  The PSA was 0.5 in March.  PSA had increased to 3.0 in June.  Axumin PET imaging from July did not reveal any evidence of active prostate carcinoma, local recurrence, or metastatic disease. The PSA continued to slowly rise and was up to 5.9 in September, 10.9 in October, and 16.3 in December.     The PSA went up to 23.81 in March 2022, so an [place] PET scan was ordered which revealed an oligometastasis skeletal lesion in the anterior right 7th rib with very subtle sclerotic change at this level. PSA in June decreased to 18.6, but then in September increased to 58.  In view of the significant rise in his PSA, a prostate specific PET scan was ordered.  PSMA PET in October 2022 revealed a persistent focus of intense radiotracer activity in the anterior RIGHT seventh rib with increased in activity from comparison exam and subtle sclerotic changes on CT. Findings remain consistent with oligometastatic skeletal metastasis. Sclerotic lesions in the T10 vertebral body without radiotracer activity potentially represented prior treated prostate cancer metastasis.  We recommended he continue his current treatment with enzalutamide, leuprolide and zoledronic acid.  PSA in November continued to increase to 83.49.  Even though his PSA continued to steadily increase, we did not recommend a change in therapy based on this alone.  He had no severe pain and no increase in pain and was having a good quality of life.  The PSA has continued to fluctuate up and down and was 31 in February and 78 in May.   Oncology History  Malignant neoplasm of prostate ([place])  03/30/2014 Cancer Staging   Staging form: Prostate, AJCC 8th Edition - Clinical stage from 03/30/2014: Stage IVA (cT3b,  cN1, cM0, PSA: 51.8, Grade Group: 4) - Signed by [name], MD on 11/14/2020 Histopathologic type: Adenocarcinoma, NOS Stage prefix: Initial diagnosis Prostate specific antigen (PSA) range: 20 or greater Gleason primary pattern: 4 Gleason secondary pattern: 4 Gleason score: 8 Histologic grading system: 5 grade system Location of positive needle core biopsies: Beyond primary site Prognostic indicators: May 2016  Robotic prostatectomy with mult. Pos margins, extra prostatic extension, 2/6 pos nodes. Scan June 2016 positive at T10  PSA up to 216.3 by August Placed on lupron and casodex Stage used in treatment planning: Yes National guidelines used in treatment planning: Yes Type of national guideline used in treatment planning: NCCN   02/18/2020 Initial Diagnosis   Malignant neoplasm of prostate ([place])   12/01/2021 -  Chemotherapy   Patient is on Treatment Plan : PROSTATE Docetaxel (75) + Prednisone q21d     Secondary malignant neoplasm of bone and bone marrow ([place])  02/18/2020 Initial Diagnosis   Secondary malignant neoplasm of bone and bone marrow ([place])   12/01/2021 -  Chemotherapy   Patient is on Treatment Plan : PROSTATE Docetaxel (75) + Prednisone q21d         INTERVAL HISTORY:  Marvin Johnson is here today for repeat clinical assessment. His next Lupron is scheduled for 02/11/22. He had been on the enzalutamide for 5 years and it had kept his disease stable. However his PSA had increased from 78.11 to 334.0 and so I ordered a PSMA PET scan. This revealed severe progression of disease with several tiny lung nodules which did have hypermetabolic activity with SUV up to 5.  He had increased bone lesions which are now innumerable, including major lesions at L3, the right iliac wing, and the left pubic bone. These were not visible on CT scan.  We stopped the enzalutamide and started him on Taxotere chemotherapy in September of 2023. He has now completed 3 cycles of Taxotere and is due for  day 1, cycle 4 on 02/01/22. His PSA continued to rise to 430. His PSA on 12/18/21 was 531.36 and on 01/08/22 it was 536.11. His HGB was 12.9 and had decreased to 12.0. His HGB has again decreased to 11.4 today. He reports feeling fatigued often since starting chemotherapy, and has not been able to continue his exercise regimen of 3 days a week in the gym. He denies any neuropathy. He still remains on MS Contin '15mg'$  every 12 hours and only requires Tylenol for breakthrough pain.  He reports an episode of exertional fatigue and chest pain on 01/19/2022 which occurred while he was walking to his mailbox. The pain resolved after he took a NTG. He notes that he will call his cardiologist to follow up on this soon. I strongly encouraged him to call their office today. He denies signs of infection such as sore throat, sinus drainage, cough, or urinary symptoms.  He denies fevers or recurrent chills. He denies nausea, vomiting, chest pain, dyspnea or cough. His weight has been stable.   REVIEW OF SYSTEMS:  Review of Systems  Constitutional:  Positive for fatigue. Negative for appetite change, chills, fever and unexpected weight change.  HENT:  Negative.  Negative for lump/mass, mouth sores and sore throat.   Eyes: Negative.   Respiratory: Negative.  Negative for chest tightness, cough, hemoptysis, shortness of breath and wheezing.   Cardiovascular: Negative.  Negative for chest pain, leg swelling and palpitations.  Gastrointestinal: Negative.  Negative for abdominal distention, abdominal pain, blood in stool, constipation, diarrhea, nausea and vomiting.  Endocrine: Negative.  Negative for hot flashes.  Genitourinary: Negative.  Negative for difficulty urinating, dysuria, frequency and hematuria.   Musculoskeletal:  Positive for arthralgias and myalgias. Negative for flank pain and gait problem.  Skin: Negative.   Neurological: Negative.  Negative for dizziness, extremity weakness, gait problem, headaches,  light-headedness, numbness, seizures and speech difficulty.  Hematological: Negative.  Negative for adenopathy. Does not bruise/bleed easily.  Psychiatric/Behavioral: Negative.  Negative for depression and sleep disturbance. The patient is not nervous/anxious.      VITALS:  Blood pressure 132/62, pulse 63, temperature (!) 97.3 F (36.3 C), temperature source Oral, resp. rate 19, height 5' 5.5" (1.664 m), weight 194 lb 1.6 oz (88 kg), SpO2 96 %.  Wt Readings from Last 3 Encounters:  02/09/22 185 lb 9.6 oz (84.2 kg)  02/03/22 194 lb (88 kg)  02/01/22 194 lb 1.3 oz (88 kg)    Body mass index is 31.81 kg/m.  Performance status (ECOG): 1 - Symptomatic but completely ambulatory  PHYSICAL EXAM:  Physical Exam Vitals and nursing note reviewed.  Constitutional:      General: He is not in acute distress.    Appearance: Normal appearance. He is normal weight. He is not ill-appearing or toxic-appearing.  HENT:     Head: Normocephalic and atraumatic.     Mouth/Throat:     Mouth: Mucous membranes are moist.     Pharynx: Oropharynx is clear. No oropharyngeal exudate or posterior oropharyngeal erythema.     Comments: Tongue pallor Eyes:     General: No scleral icterus.    Extraocular Movements: Extraocular movements intact.     Conjunctiva/sclera: Conjunctivae normal.     Pupils: Pupils are equal, round, and reactive to light.  Cardiovascular:     Rate and Rhythm: Normal rate and regular rhythm.     Pulses: Normal pulses.     Heart sounds: Normal heart sounds. No murmur heard.    No friction rub. No gallop.  Pulmonary:     Effort: Pulmonary effort is normal. No respiratory distress.     Breath sounds: Normal breath sounds. No wheezing, rhonchi or rales.  Abdominal:     General: Bowel sounds are normal. There is no distension.     Palpations: Abdomen is soft. There is no hepatomegaly, splenomegaly or mass.     Tenderness: There is no abdominal tenderness.  Musculoskeletal:         General: No swelling or tenderness. Normal range of motion.     Cervical back: Normal range of motion and neck supple. No tenderness.     Right lower leg: No edema.     Left lower leg: No edema.  Lymphadenopathy:     Cervical: No cervical adenopathy.     Upper Body:     Right upper body: No supraclavicular or axillary adenopathy.     Left upper body: No supraclavicular or axillary adenopathy.     Lower Body: No right inguinal adenopathy. No left inguinal adenopathy.  Skin:    General: Skin is warm and dry.     Coloration: Skin is pale. Skin is not jaundiced.     Findings: No rash.  Neurological:     Mental Status: He is alert and oriented to person, place, and time.     Cranial Nerves: No cranial nerve deficit.  Psychiatric:        Mood and Affect: Mood normal.        Behavior: Behavior normal.        Thought Content: Thought content normal.    LABS:   Lab Results  Component Value Date   PSA1 334.0 (H) 10/30/2021   PSA1 31.6 (H) 05/07/2021   PSA1 48.3 (H) 11/12/2020        Latest Ref Rng & Units 02/09/2022   10:48 AM 02/09/2022    5:00 AM 02/08/2022    8:38  AM  CBC  WBC 4.0 - 10.5 K/uL 25.9  21.7  9.3   Hemoglobin 13.0 - 17.0 g/dL 10.6  10.3  10.9   Hematocrit 39.0 - 52.0 % 33.6  33.1  33.9   Platelets 150 - 400 K/uL 286  272  269       Latest Ref Rng & Units 02/09/2022    5:00 AM 02/08/2022    8:38 AM 02/07/2022   11:58 PM  CMP  Glucose 70 - 99 mg/dL 101  102  182   BUN 8 - 23 mg/dL '11  13  16   '$ Creatinine 0.61 - 1.24 mg/dL 0.86  0.73  0.88   Sodium 135 - 145 mmol/L 136  138  139   Potassium 3.5 - 5.1 mmol/L 3.6  3.7  3.8   Chloride 98 - 111 mmol/L 106  103  105   CO2 22 - 32 mmol/L '23  23  23   '$ Calcium 8.9 - 10.3 mg/dL 7.8  8.8  9.4      No results found for: "CEA1", "CEA" / No results found for: "CEA1", "CEA" Lab Results  Component Value Date   PSA1 334.0 (H) 10/30/2021   No results found for: "KPT465" No results found for: "CAN125"  No results  found for: "TOTALPROTELP", "ALBUMINELP", "A1GS", "A2GS", "BETS", "BETA2SER", "GAMS", "MSPIKE", "SPEI" Lab Results  Component Value Date   TIBC 398 01/29/2022   FERRITIN 206 01/29/2022   IRONPCTSAT 25 01/29/2022   No results found for: "LDH"  STUDIES:    NM PET (PSMA) SKULL TO MID THIGH Result Date: 11/23/2021 CLINICAL DATA:  Prostate carcinoma with biochemical recurrence. EXAM: NUCLEAR MEDICINE PET SKULL BASE TO THIGH TECHNIQUE: 9.4 mCi F18 Piflufolastat (Pylarify) was injected intravenously. Full-ring PET imaging was performed from the skull base to thigh after the radiotracer. CT data was obtained and used for attenuation correction and anatomic localization. COMPARISON:  12/24/2020 FINDINGS: NECK No radiotracer activity in neck lymph nodes. Incidental CT finding: None. CHEST No tracer avid supraclavicular, axillary, mediastinal, or hilar lymph nodes. Interval development of multiple small tracer pulmonary nodules which are concerning for metastatic disease. These nodules are all very small measuring (no more than 5 mm) and are technically too small to characterize by PET-CT. However a few of these nodules are tracer avid including: -Posteromedial right upper lobe nodule measures 5 mm with SUV max of 4.89, image 81/4. -Nodule within the inferior lingula measures 5 mm within SUV max of 5.1, image 94/4. -Nodule within the medial right apex measures 3 mm with SUV max of 3.18, image 65/4. Incidental CT finding: Aortic atherosclerosis and multi vessel coronary artery calcifications. ABDOMEN/PELVIS Prostate: No focal activity in the prostate bed. Lymph nodes: No abnormal radiotracer accumulation within pelvic or abdominal nodes. Liver: No evidence of liver metastasis. Incidental CT finding: None. [place] There is been marked interval progression of tracer avid bone metastases. Innumerable tracer avid bone lesions are now identified compared with solitary tracer avid bone metastases noted on the previous  exam. New index lesions include: -FDG avid lesion within the L3 vertebral body with SUV max of 36.52, image 139/4. No corresponding CT changes identified. -Tracer avid bone lesion within the right iliac wing has an SUV max of 34.4, image 165/4. No corresponding CT changes identified. -parasymphyseal lesion within the left pubic bone has an SUV max of 42.15, image 191/4. No corresponding CT changes noted. -Previous tracer avid sclerotic bone lesion involving the right seventh rib has an SUV max of  27.67 on today's study, image 110/4. Previously 27.6 on the previous exam. IMPRESSION: 1. Marked interval progression tracer avid bone metastases. 2. Interval development of multiple tiny lung nodules. Several of these nodules are tracer avid and are worrisome for pulmonary metastases. 3. Aortic Atherosclerosis (ICD10-I70.0). Coronary artery calcifications. Electronically Signed   By: Kerby Moors M.D.   On: 11/23/2021 10:50     HISTORY:   Past Medical History:  Diagnosis Date   Hyperlipidemia    Malignant neoplasm of prostate ([place])    Supraventricular tachycardia     Past Surgical History:  Procedure Laterality Date   CORONARY STENT INTERVENTION N/A 02/08/2022   Procedure: CORONARY STENT INTERVENTION;  Surgeon: Nelva Bush, MD;  Location: [facility];  Service: Cardiovascular;  Laterality: N/A;   LEFT HEART CATH AND CORONARY ANGIOGRAPHY N/A 02/08/2022   Procedure: LEFT HEART CATH AND CORONARY ANGIOGRAPHY;  Surgeon: Nelva Bush, MD;  Location: [facility];  Service: Cardiovascular;  Laterality: N/A;   PROSTATECTOMY  07/2014   TONSILLECTOMY      Family History  Problem Relation Age of Onset   Breast cancer Mother 26   Ovarian cancer Mother 62   Breast cancer Paternal Grandmother 73    Social History:  reports that he has never smoked. He has never used smokeless tobacco. He reports that he does not currently use alcohol. He reports that he does not use drugs.The patient  is alone today.  Allergies:  Allergies  Allergen Reactions   Atorvastatin    Rosuvastatin Other (See Comments)    Other reaction(s): Muscle pain   Simvastatin Other (See Comments)    Other reaction(s): Joint pain, Muscle pain   Sulfa Antibiotics    Sulfamethoxazole-Trimethoprim Other (See Comments)    Unknown as to interaction was a baby.    Current Medications: Current Outpatient Medications  Medication Sig Dispense Refill   dexamethasone (DECADRON) 4 MG tablet Take 8 mg by mouth See admin instructions. Take two tablets by mouth twice a day (start the day before chemo, then take daily for 2 days starting te day after chemo; take with food)     Alirocumab (PRALUENT Weed) Inject into the skin. One injection every 2 weeks Monday     aspirin 81 MG EC tablet Take 81 mg by mouth daily.     Cholecalciferol 25 MCG (1000 UT) tablet Take 1,000 Units by mouth in the morning and at bedtime.     clopidogrel (PLAVIX) 75 MG tablet Take 75 mg by mouth daily.     diphenhydrAMINE (SOMINEX) 25 MG tablet Take 50 mg by mouth at bedtime.     ezetimibe (ZETIA) 10 MG tablet Take 10 mg by mouth daily.     isosorbide mononitrate (IMDUR) 30 MG 24 hr tablet Take 1 tablet by mouth every 8 (eight) hours as needed. (Patient not taking: Reported on 02/08/2022)     leuprolide (LUPRON) 22.5 MG injection Inject 22.5 mg into the muscle every 3 (three) months.     metoprolol tartrate (LOPRESSOR) 25 MG tablet Take 12.5 mg by mouth 2 (two) times daily.     morphine (MS CONTIN) 15 MG 12 hr tablet Take 1 tablet (15 mg total) by mouth every 12 (twelve) hours. 60 tablet 0   Multiple Vitamins-Minerals (MULTIVITAMIN WITH MINERALS) tablet Take 1 tablet by mouth daily.     nitroGLYCERIN (NITROSTAT) 0.4 MG SL tablet Place 0.4 mg under the tongue every 5 (five) minutes as needed for chest pain. Repeat every 5 minutes  if needed for a total of 3 tablets in 15 minutes. If no relief, CALL 911.     Omega-3 Fatty Acids (FISH OIL) 1000 MG  CAPS Take 2,000 mg by mouth in the morning and at bedtime.     ondansetron (ZOFRAN) 8 MG tablet Take 1 tablet (8 mg total) by mouth every 8 (eight) hours as needed for nausea or vomiting. (Patient not taking: Reported on 02/08/2022) 30 tablet 1   predniSONE (DELTASONE) 5 MG tablet Take 1 tablet (5 mg total) by mouth in the morning and at bedtime. 60 tablet 11   Royal Jelly-Bee Pollen-Ginseng (KOREAN GINSENG COMPLEX) 150-250-50 MG CAPS Take 1 capsule by mouth daily.     senna (SENOKOT) 8.6 MG TABS tablet Take 4 tablets by mouth 2 (two) times daily as needed for moderate constipation.     Zoledronic Acid (ZOMETA) 4 MG/100ML IVPB Inject 4 mg into the vein every 3 (three) months.     No current facility-administered medications for this visit.

## 2022-01-30 ENCOUNTER — Other Ambulatory Visit: Payer: Self-pay

## 2022-02-01 ENCOUNTER — Ambulatory Visit: Payer: Non-veteran care | Admitting: Oncology

## 2022-02-01 ENCOUNTER — Telehealth: Payer: Self-pay

## 2022-02-01 ENCOUNTER — Other Ambulatory Visit: Payer: Non-veteran care

## 2022-02-01 ENCOUNTER — Inpatient Hospital Stay: Payer: No Typology Code available for payment source

## 2022-02-01 ENCOUNTER — Ambulatory Visit: Payer: Non-veteran care

## 2022-02-01 VITALS — BP 150/73 | HR 79 | Temp 97.9°F | Resp 18 | Ht 65.5 in | Wt 194.1 lb

## 2022-02-01 DIAGNOSIS — C61 Malignant neoplasm of prostate: Secondary | ICD-10-CM

## 2022-02-01 DIAGNOSIS — C7951 Secondary malignant neoplasm of bone: Secondary | ICD-10-CM

## 2022-02-01 DIAGNOSIS — Z5111 Encounter for antineoplastic chemotherapy: Secondary | ICD-10-CM | POA: Diagnosis not present

## 2022-02-01 MED ORDER — SODIUM CHLORIDE 0.9% FLUSH
10.0000 mL | INTRAVENOUS | Status: DC | PRN
  Administered 2022-02-01: 10 mL

## 2022-02-01 MED ORDER — HEPARIN SOD (PORK) LOCK FLUSH 100 UNIT/ML IV SOLN
500.0000 [IU] | Freq: Once | INTRAVENOUS | Status: AC | PRN
  Administered 2022-02-01: 500 [IU]

## 2022-02-01 MED ORDER — SODIUM CHLORIDE 0.9 % IV SOLN: Freq: Once | INTRAVENOUS | Status: AC

## 2022-02-01 MED ORDER — SODIUM CHLORIDE 0.9 % IV SOLN
60.0000 mg/m2 | Freq: Once | INTRAVENOUS | Status: AC
  Administered 2022-02-01: 120 mg via INTRAVENOUS
  Filled 2022-02-01: qty 12

## 2022-02-01 MED ORDER — SODIUM CHLORIDE 0.9 % IV SOLN
10.0000 mg | Freq: Once | INTRAVENOUS | Status: AC
  Administered 2022-02-01: 10 mg via INTRAVENOUS
  Filled 2022-02-01: qty 10

## 2022-02-01 NOTE — Patient Instructions (Signed)
Docetaxel Injection What is this medication? DOCETAXEL (doe se TAX el) treats some types of cancer. It works by slowing down the growth of cancer cells. This medicine may be used for other purposes; ask your health care provider or pharmacist if you have questions. COMMON BRAND NAME(S): Docefrez, Taxotere What should I tell my care team before I take this medication? They need to know if you have any of these conditions: Kidney disease Liver disease Low white blood cell levels Tingling of the fingers or toes or other nerve disorder An unusual or allergic reaction to docetaxel, polysorbate 80, other medications, foods, dyes, or preservatives Pregnant or trying to get pregnant Breast-feeding How should I use this medication? This medication is injected into a vein. It is given by your care team in a hospital or clinic setting. Talk to your care team about the use of this medication in children. Special care may be needed. Overdosage: If you think you have taken too much of this medicine contact a poison control center or emergency room at once. NOTE: This medicine is only for you. Do not share this medicine with others. What if I miss a dose? Keep appointments for follow-up doses. It is important not to miss your dose. Call your care team if you are unable to keep an appointment. What may interact with this medication? Do not take this medication with any of the following: Live virus vaccines This medication may also interact with the following: Certain antibiotics, such as clarithromycin, telithromycin Certain antivirals for HIV or hepatitis Certain medications for fungal infections, such as itraconazole, ketoconazole, voriconazole Grapefruit juice Nefazodone Supplements, such as St. John's wort This list may not describe all possible interactions. Give your health care provider a list of all the medicines, herbs, non-prescription drugs, or dietary supplements you use. Also tell them if  you smoke, drink alcohol, or use illegal drugs. Some items may interact with your medicine. What should I watch for while using this medication? This medication may make you feel generally unwell. This is not uncommon as chemotherapy can affect healthy cells as well as cancer cells. Report any side effects. Continue your course of treatment even though you feel ill unless your care team tells you to stop. You may need blood work done while you are taking this medication. This medication can cause serious side effects and infusion reactions. To reduce the risk, your care team may give you other medications to take before receiving this one. Be sure to follow the directions from your care team. This medication may increase your risk of getting an infection. Call your care team for advice if you get a fever, chills, sore throat, or other symptoms of a cold or flu. Do not treat yourself. Try to avoid being around people who are sick. Avoid taking medications that contain aspirin, acetaminophen, ibuprofen, naproxen, or ketoprofen unless instructed by your care team. These medications may hide a fever. Be careful brushing or flossing your teeth or using a toothpick because you may get an infection or bleed more easily. If you have any dental work done, tell your dentist you are receiving this medication. Some products may contain alcohol. Ask your care team if this medication contains alcohol. Be sure to tell all care teams you are taking this medicine. Certain medications, like metronidazole and disulfiram, can cause an unpleasant reaction when taken with alcohol. The reaction includes flushing, headache, nausea, vomiting, sweating, and increased thirst. The reaction can last from 30 minutes to several hours.  This medication may affect your coordination, reaction time, or judgement. Do not drive or operate machinery until you know how this medication affects you. Sit up or stand slowly to reduce the risk of  dizzy or fainting spells. Drinking alcohol with this medication can increase the risk of these side effects. Talk to your care team about your risk of cancer. You may be more at risk for certain types of cancer if you take this medication. Talk to your care team if you wish to become pregnant or think you might be pregnant. This medication can cause serious birth defects if taken during pregnancy or if you get pregnant within 2 months after stopping therapy. A negative pregnancy test is required before starting this medication. A reliable form of contraception is recommended while taking this medication and for 2 months after stopping it. Talk to your care team about reliable forms of contraception. Do not breast-feed while taking this medication and for 1 week after stopping therapy. Use a condom during sex and for 4 months after stopping therapy. Tell your care team right away if you think your partner might be pregnant. This medication can cause serious birth defects. This medication may cause infertility. Talk to your care team if you are concerned about your fertility. What side effects may I notice from receiving this medication? Side effects that you should report to your care team as soon as possible: Allergic reactions--skin rash, itching, hives, swelling of the face, lips, tongue, or throat Change in vision such as blurry vision, seeing halos around lights, vision loss Infection--fever, chills, cough, or sore throat Infusion reactions--chest pain, shortness of breath or trouble breathing, feeling faint or lightheaded Low red blood cell level--unusual weakness or fatigue, dizziness, headache, trouble breathing Pain, tingling, or numbness in the hands or feet Painful swelling, warmth, or redness of the skin, blisters or sores at the infusion site Redness, blistering, peeling, or loosening of the skin, including inside the mouth Sudden or severe stomach pain, bloody diarrhea, fever, nausea,  vomiting Swelling of the ankles, hands, or feet Tumor lysis syndrome (TLS)--nausea, vomiting, diarrhea, decrease in the amount of urine, dark urine, unusual weakness or fatigue, confusion, muscle pain or cramps, fast or irregular heartbeat, joint pain Unusual bruising or bleeding Side effects that usually do not require medical attention (report to your care team if they continue or are bothersome): Change in nail shape, thickness, or color Change in taste Hair loss Increased tears This list may not describe all possible side effects. Call your doctor for medical advice about side effects. You may report side effects to FDA at 1-800-FDA-1088. Where should I keep my medication? This medication is given in a hospital or clinic. It will not be stored at home. NOTE: This sheet is a summary. It may not cover all possible information. If you have questions about this medicine, talk to your doctor, pharmacist, or health care provider.  2023 Elsevier/Gold Standard (2007-04-22 00:00:00)

## 2022-02-01 NOTE — Telephone Encounter (Signed)
-----   Message from [name], MD sent at 02/01/2022  2:23 PM EST ----- Regarding: call Tell him treatment is working, PSA down from 536 to 400! BS 101, perfect. He is more anemic but vitamin levels all okay, is from his chemo. B12 is extremely high, doesn't need

## 2022-02-01 NOTE — Telephone Encounter (Signed)
Patient notified

## 2022-02-02 ENCOUNTER — Ambulatory Visit: Payer: Non-veteran care

## 2022-02-03 ENCOUNTER — Inpatient Hospital Stay: Payer: No Typology Code available for payment source

## 2022-02-03 ENCOUNTER — Ambulatory Visit: Payer: Non-veteran care

## 2022-02-03 VITALS — BP 137/84 | HR 84 | Temp 97.4°F | Resp 18 | Wt 194.0 lb

## 2022-02-03 DIAGNOSIS — Z5111 Encounter for antineoplastic chemotherapy: Secondary | ICD-10-CM | POA: Diagnosis not present

## 2022-02-03 DIAGNOSIS — C7951 Secondary malignant neoplasm of bone: Secondary | ICD-10-CM

## 2022-02-03 DIAGNOSIS — C61 Malignant neoplasm of prostate: Secondary | ICD-10-CM

## 2022-02-03 MED ORDER — PEGFILGRASTIM-CBQV 6 MG/0.6ML ~~LOC~~ SOSY
6.0000 mg | PREFILLED_SYRINGE | Freq: Once | SUBCUTANEOUS | Status: AC
  Administered 2022-02-03: 6 mg via SUBCUTANEOUS
  Filled 2022-02-03: qty 0.6

## 2022-02-03 NOTE — Patient Instructions (Signed)

## 2022-02-05 ENCOUNTER — Ambulatory Visit: Payer: Non-veteran care

## 2022-02-07 ENCOUNTER — Encounter (HOSPITAL_COMMUNITY): Payer: Self-pay

## 2022-02-07 ENCOUNTER — Other Ambulatory Visit: Payer: Self-pay

## 2022-02-07 ENCOUNTER — Inpatient Hospital Stay (HOSPITAL_COMMUNITY)
Admission: EM | Admit: 2022-02-07 | Discharge: 2022-02-09 | DRG: 321 | Disposition: A | Discharge status: Home / Self Care | Payer: No Typology Code available for payment source | Attending: Cardiology | Admitting: Cardiology

## 2022-02-07 DIAGNOSIS — Z888 Allergy status to other drugs, medicaments and biological substances status: Secondary | ICD-10-CM

## 2022-02-07 DIAGNOSIS — Z7902 Long term (current) use of antithrombotics/antiplatelets: Secondary | ICD-10-CM

## 2022-02-07 DIAGNOSIS — Z7982 Long term (current) use of aspirin: Secondary | ICD-10-CM

## 2022-02-07 DIAGNOSIS — Z79890 Hormone replacement therapy (postmenopausal): Secondary | ICD-10-CM

## 2022-02-07 DIAGNOSIS — Z7952 Long term (current) use of systemic steroids: Secondary | ICD-10-CM

## 2022-02-07 DIAGNOSIS — Y831 Surgical operation with implant of artificial internal device as the cause of abnormal reaction of the patient, or of later complication, without mention of misadventure at the time of the procedure: Secondary | ICD-10-CM | POA: Diagnosis present

## 2022-02-07 DIAGNOSIS — Z882 Allergy status to sulfonamides status: Secondary | ICD-10-CM

## 2022-02-07 DIAGNOSIS — I25110 Atherosclerotic heart disease of native coronary artery with unstable angina pectoris: Secondary | ICD-10-CM | POA: Diagnosis present

## 2022-02-07 DIAGNOSIS — D849 Immunodeficiency, unspecified: Secondary | ICD-10-CM | POA: Diagnosis present

## 2022-02-07 DIAGNOSIS — C61 Malignant neoplasm of prostate: Secondary | ICD-10-CM | POA: Diagnosis present

## 2022-02-07 DIAGNOSIS — G8929 Other chronic pain: Secondary | ICD-10-CM | POA: Diagnosis present

## 2022-02-07 DIAGNOSIS — Z79899 Other long term (current) drug therapy: Secondary | ICD-10-CM

## 2022-02-07 DIAGNOSIS — D72829 Elevated white blood cell count, unspecified: Secondary | ICD-10-CM | POA: Diagnosis not present

## 2022-02-07 DIAGNOSIS — I214 Non-ST elevation (NSTEMI) myocardial infarction: Secondary | ICD-10-CM | POA: Diagnosis not present

## 2022-02-07 DIAGNOSIS — I10 Essential (primary) hypertension: Secondary | ICD-10-CM | POA: Diagnosis present

## 2022-02-07 DIAGNOSIS — T82855A Stenosis of coronary artery stent, initial encounter: Secondary | ICD-10-CM | POA: Diagnosis present

## 2022-02-07 DIAGNOSIS — E785 Hyperlipidemia, unspecified: Secondary | ICD-10-CM | POA: Diagnosis present

## 2022-02-07 DIAGNOSIS — Z955 Presence of coronary angioplasty implant and graft: Secondary | ICD-10-CM

## 2022-02-07 DIAGNOSIS — Z803 Family history of malignant neoplasm of breast: Secondary | ICD-10-CM

## 2022-02-07 NOTE — ED Triage Notes (Signed)
Pt reports chest pain x 1 weeks and exertional SOB. He took one SL nitro tab prior to coming to the ER which resolved his chest pain. Hx of multiple cardiac stents.  He has prostate cancer, last chemo on Tuesday.

## 2022-02-07 NOTE — ED Provider Triage Note (Signed)
Emergency Medicine Provider Triage Evaluation Note  Marvin Johnson , a 71 y.o. male  was evaluated in triage.  Pt complains of chest pain.  States has been having chest issues/SOB.  Took 3 SL NTG today but did not improve.  He does have hx of CAD and stents x4.  Follows with cardiology at [facility].  Also undergoing chemotherapy for prostate cancer but denies fever/chills/sweats or other infectious symptoms.  Last chemo Tuesday 02/02/22.  Review of Systems  Positive: Chest pain, SOB Negative: fever  Physical Exam  BP (!) 154/80   Pulse (!) 107   Temp 99 F (37.2 C) (Oral)   Resp 16   SpO2 96%  Gen:   Awake, no distress   Resp:  Normal effort  MSK:   Moves extremities without difficulty  Other:    Medical Decision Making  Medically screening exam initiated at 11:44 PM.  Appropriate orders placed.  Finn Amos was informed that the remainder of the evaluation will be completed by another provider, this initial triage assessment does not replace that evaluation, and the importance of remaining in the ED until their evaluation is complete.  Chest pain, SOB.  Hx CAD and stents x4.  Follow[facility] with [facility]ology at [facility].  Also undergoing chemotherapy for prostate cancer but no fever/chills or other infectious symptoms.  EKG, labs, CXR ordered.   Larene Pickett, PA-C 02/07/22 2347

## 2022-02-08 ENCOUNTER — Encounter (HOSPITAL_COMMUNITY)
Admission: EM | Disposition: A | Discharge status: Home / Self Care | Payer: Self-pay | Source: Home / Self Care | Attending: Cardiology

## 2022-02-08 ENCOUNTER — Emergency Department (HOSPITAL_COMMUNITY): Payer: No Typology Code available for payment source

## 2022-02-08 ENCOUNTER — Inpatient Hospital Stay (HOSPITAL_COMMUNITY): Payer: No Typology Code available for payment source

## 2022-02-08 DIAGNOSIS — I25110 Atherosclerotic heart disease of native coronary artery with unstable angina pectoris: Secondary | ICD-10-CM | POA: Diagnosis present

## 2022-02-08 DIAGNOSIS — Z7982 Long term (current) use of aspirin: Secondary | ICD-10-CM | POA: Diagnosis not present

## 2022-02-08 DIAGNOSIS — Z7952 Long term (current) use of systemic steroids: Secondary | ICD-10-CM | POA: Diagnosis not present

## 2022-02-08 DIAGNOSIS — D849 Immunodeficiency, unspecified: Secondary | ICD-10-CM | POA: Diagnosis present

## 2022-02-08 DIAGNOSIS — T82855A Stenosis of coronary artery stent, initial encounter: Secondary | ICD-10-CM | POA: Diagnosis present

## 2022-02-08 DIAGNOSIS — I2510 Atherosclerotic heart disease of native coronary artery without angina pectoris: Secondary | ICD-10-CM | POA: Diagnosis not present

## 2022-02-08 DIAGNOSIS — Z79899 Other long term (current) drug therapy: Secondary | ICD-10-CM | POA: Diagnosis not present

## 2022-02-08 DIAGNOSIS — Z888 Allergy status to other drugs, medicaments and biological substances status: Secondary | ICD-10-CM | POA: Diagnosis not present

## 2022-02-08 DIAGNOSIS — Z803 Family history of malignant neoplasm of breast: Secondary | ICD-10-CM | POA: Diagnosis not present

## 2022-02-08 DIAGNOSIS — Y831 Surgical operation with implant of artificial internal device as the cause of abnormal reaction of the patient, or of later complication, without mention of misadventure at the time of the procedure: Secondary | ICD-10-CM | POA: Diagnosis present

## 2022-02-08 DIAGNOSIS — Z7902 Long term (current) use of antithrombotics/antiplatelets: Secondary | ICD-10-CM | POA: Diagnosis not present

## 2022-02-08 DIAGNOSIS — D72829 Elevated white blood cell count, unspecified: Secondary | ICD-10-CM | POA: Diagnosis not present

## 2022-02-08 DIAGNOSIS — I214 Non-ST elevation (NSTEMI) myocardial infarction: Secondary | ICD-10-CM | POA: Diagnosis present

## 2022-02-08 DIAGNOSIS — G8929 Other chronic pain: Secondary | ICD-10-CM | POA: Diagnosis present

## 2022-02-08 DIAGNOSIS — Z882 Allergy status to sulfonamides status: Secondary | ICD-10-CM | POA: Diagnosis not present

## 2022-02-08 DIAGNOSIS — E785 Hyperlipidemia, unspecified: Secondary | ICD-10-CM | POA: Diagnosis present

## 2022-02-08 DIAGNOSIS — C61 Malignant neoplasm of prostate: Secondary | ICD-10-CM | POA: Diagnosis present

## 2022-02-08 DIAGNOSIS — I10 Essential (primary) hypertension: Secondary | ICD-10-CM | POA: Diagnosis present

## 2022-02-08 DIAGNOSIS — Z79890 Hormone replacement therapy (postmenopausal): Secondary | ICD-10-CM | POA: Diagnosis not present

## 2022-02-08 HISTORY — PX: LEFT HEART CATH AND CORONARY ANGIOGRAPHY: CATH118249

## 2022-02-08 HISTORY — PX: CORONARY STENT INTERVENTION: CATH118234

## 2022-02-08 LAB — LIPID PANEL
Cholesterol: 178 mg/dL (ref 0–200)
HDL: 60 mg/dL (ref >40)
LDL Cholesterol: 94 mg/dL (ref 0–99)
Total CHOL/HDL Ratio: 3 RATIO
Triglycerides: 122 mg/dL (ref <150)
VLDL: 24 mg/dL (ref 0–40)

## 2022-02-08 LAB — CBC WITH DIFFERENTIAL/PLATELET
Abs Immature Granulocytes: 0.34 10*3/uL — ABNORMAL HIGH (ref 0.00–0.07)
Basophils Absolute: 0.1 10*3/uL (ref 0.0–0.1)
Basophils Relative: 2 %
Eosinophils Absolute: 0 10*3/uL (ref 0.0–0.5)
Eosinophils Relative: 1 %
HCT: 35.3 % — ABNORMAL LOW (ref 39.0–52.0)
Hemoglobin: 11.3 g/dL — ABNORMAL LOW (ref 13.0–17.0)
Immature Granulocytes: 6 %
Lymphocytes Relative: 32 %
Lymphs Abs: 1.9 10*3/uL (ref 0.7–4.0)
MCH: 29.8 pg (ref 26.0–34.0)
MCHC: 32 g/dL (ref 30.0–36.0)
MCV: 93.1 fL (ref 80.0–100.0)
Monocytes Absolute: 0.8 10*3/uL (ref 0.1–1.0)
Monocytes Relative: 14 %
Neutro Abs: 2.7 10*3/uL (ref 1.7–7.7)
Neutrophils Relative %: 45 %
Platelets: 270 10*3/uL (ref 150–400)
RBC: 3.79 MIL/uL — ABNORMAL LOW (ref 4.22–5.81)
RDW: 15.9 % — ABNORMAL HIGH (ref 11.5–15.5)
WBC: 5.8 10*3/uL (ref 4.0–10.5)
nRBC: 1.2 % — ABNORMAL HIGH (ref 0.0–0.2)

## 2022-02-08 LAB — BASIC METABOLIC PANEL
Anion gap: 11 (ref 5–15)
Anion gap: 12 (ref 5–15)
BUN: 13 mg/dL (ref 8–23)
BUN: 16 mg/dL (ref 8–23)
CO2: 23 mmol/L (ref 22–32)
CO2: 23 mmol/L (ref 22–32)
Calcium: 8.8 mg/dL — ABNORMAL LOW (ref 8.9–10.3)
Calcium: 9.4 mg/dL (ref 8.9–10.3)
Chloride: 103 mmol/L (ref 98–111)
Chloride: 105 mmol/L (ref 98–111)
Creatinine, Ser: 0.73 mg/dL (ref 0.61–1.24)
Creatinine, Ser: 0.88 mg/dL (ref 0.61–1.24)
GFR, Estimated: >60 mL/min (ref >60)
GFR, Estimated: >60 mL/min (ref >60)
Glucose, Bld: 102 mg/dL — ABNORMAL HIGH (ref 70–99)
Glucose, Bld: 182 mg/dL — ABNORMAL HIGH (ref 70–99)
Potassium: 3.7 mmol/L (ref 3.5–5.1)
Potassium: 3.8 mmol/L (ref 3.5–5.1)
Sodium: 138 mmol/L (ref 135–145)
Sodium: 139 mmol/L (ref 135–145)

## 2022-02-08 LAB — CBC
HCT: 33.9 % — ABNORMAL LOW (ref 39.0–52.0)
Hemoglobin: 10.9 g/dL — ABNORMAL LOW (ref 13.0–17.0)
MCH: 29.9 pg (ref 26.0–34.0)
MCHC: 32.2 g/dL (ref 30.0–36.0)
MCV: 92.9 fL (ref 80.0–100.0)
Platelets: 269 10*3/uL (ref 150–400)
RBC: 3.65 MIL/uL — ABNORMAL LOW (ref 4.22–5.81)
RDW: 15.9 % — ABNORMAL HIGH (ref 11.5–15.5)
WBC: 9.3 10*3/uL (ref 4.0–10.5)
nRBC: 1.6 % — ABNORMAL HIGH (ref 0.0–0.2)

## 2022-02-08 LAB — ECHOCARDIOGRAM COMPLETE
Area-P 1/2: 3.31 cm2
Height: 65.5 in
S' Lateral: 2.5 cm
Weight: 3104 oz

## 2022-02-08 LAB — POCT ACTIVATED CLOTTING TIME
Activated Clotting Time: 293 seconds
Activated Clotting Time: 299 seconds
Activated Clotting Time: 299 seconds
Activated Clotting Time: 257 seconds
Activated Clotting Time: 269 seconds

## 2022-02-08 LAB — BRAIN NATRIURETIC PEPTIDE: B Natriuretic Peptide: 81.6 pg/mL (ref 0.0–100.0)

## 2022-02-08 LAB — HEPARIN LEVEL (UNFRACTIONATED): Heparin Unfractionated: >1.1 IU/mL — ABNORMAL HIGH (ref 0.30–0.70)

## 2022-02-08 LAB — TROPONIN I (HIGH SENSITIVITY)
Troponin I (High Sensitivity): 300 ng/L (ref <18)
Troponin I (High Sensitivity): 375 ng/L (ref <18)

## 2022-02-08 LAB — PROTIME-INR
INR: 1.3 — ABNORMAL HIGH (ref 0.8–1.2)
Prothrombin Time: 15.7 seconds — ABNORMAL HIGH (ref 11.4–15.2)

## 2022-02-08 SURGERY — LEFT HEART CATH AND CORONARY ANGIOGRAPHY
Anesthesia: LOCAL

## 2022-02-08 MED ORDER — METOPROLOL TARTRATE 12.5 MG HALF TABLET
12.5000 mg | ORAL_TABLET | Freq: Two times a day (BID) | ORAL | Status: DC
  Administered 2022-02-08 – 2022-02-09 (×3): 12.5 mg via ORAL
  Filled 2022-02-08 (×4): qty 1

## 2022-02-08 MED ORDER — IOHEXOL 350 MG/ML SOLN
INTRAVENOUS | Status: DC | PRN
  Administered 2022-02-08: 255 mL

## 2022-02-08 MED ORDER — HEPARIN BOLUS VIA INFUSION
4000.0000 [IU] | Freq: Once | INTRAVENOUS | Status: AC
  Administered 2022-02-08: 4000 [IU] via INTRAVENOUS
  Filled 2022-02-08: qty 4000

## 2022-02-08 MED ORDER — SODIUM CHLORIDE 0.9 % IV SOLN: INTRAVENOUS | Status: AC

## 2022-02-08 MED ORDER — HEPARIN (PORCINE) IN NACL 1000-0.9 UT/500ML-% IV SOLN
INTRAVENOUS | Status: DC | PRN
  Administered 2022-02-08 (×2): 500 mL

## 2022-02-08 MED ORDER — HEPARIN (PORCINE) 25000 UT/250ML-% IV SOLN
1100.0000 [IU]/h | INTRAVENOUS | Status: DC
  Administered 2022-02-08: 1100 [IU]/h via INTRAVENOUS
  Filled 2022-02-08: qty 250

## 2022-02-08 MED ORDER — LIDOCAINE HCL (PF) 1 % IJ SOLN
INTRAMUSCULAR | Status: DC | PRN
  Administered 2022-02-08: 2 mL

## 2022-02-08 MED ORDER — SENNA 8.6 MG PO TABS: 4.0000 | ORAL_TABLET | Freq: Two times a day (BID) | ORAL | Status: DC | PRN

## 2022-02-08 MED ORDER — SODIUM CHLORIDE 0.9 % IV SOLN: 250.0000 mL | INTRAVENOUS | Status: DC | PRN

## 2022-02-08 MED ORDER — SODIUM CHLORIDE 0.9% FLUSH: 3.0000 mL | INTRAVENOUS | Status: DC | PRN

## 2022-02-08 MED ORDER — MIDAZOLAM HCL 2 MG/2ML IJ SOLN
INTRAMUSCULAR | Status: DC | PRN
  Administered 2022-02-08: 1 mg via INTRAVENOUS

## 2022-02-08 MED ORDER — NITROGLYCERIN 1 MG/10 ML FOR IR/CATH LAB
INTRA_ARTERIAL | Status: AC
  Filled 2022-02-08: qty 10

## 2022-02-08 MED ORDER — ACETAMINOPHEN 325 MG PO TABS
650.0000 mg | ORAL_TABLET | ORAL | Status: DC | PRN
  Administered 2022-02-08: 650 mg via ORAL
  Filled 2022-02-08: qty 2

## 2022-02-08 MED ORDER — NITROGLYCERIN 0.4 MG SL SUBL: 0.4000 mg | SUBLINGUAL_TABLET | SUBLINGUAL | Status: DC | PRN

## 2022-02-08 MED ORDER — MORPHINE SULFATE ER 15 MG PO TBCR
15.0000 mg | EXTENDED_RELEASE_TABLET | Freq: Two times a day (BID) | ORAL | Status: DC
  Administered 2022-02-08 – 2022-02-09 (×2): 15 mg via ORAL
  Filled 2022-02-08 (×2): qty 1

## 2022-02-08 MED ORDER — HEPARIN SODIUM (PORCINE) 1000 UNIT/ML IJ SOLN
INTRAMUSCULAR | Status: DC | PRN
  Administered 2022-02-08: 4500 [IU] via INTRAVENOUS
  Administered 2022-02-08 (×2): 2000 [IU] via INTRAVENOUS
  Administered 2022-02-08: 4500 [IU] via INTRAVENOUS
  Administered 2022-02-08: 2000 [IU] via INTRAVENOUS

## 2022-02-08 MED ORDER — NITROGLYCERIN 1 MG/10 ML FOR IR/CATH LAB
INTRA_ARTERIAL | Status: DC | PRN
  Administered 2022-02-08 (×3): 200 ug via INTRACORONARY
  Administered 2022-02-08: 100 ug via INTRACORONARY
  Administered 2022-02-08 (×3): 200 ug via INTRACORONARY

## 2022-02-08 MED ORDER — VERAPAMIL HCL 2.5 MG/ML IV SOLN
INTRAVENOUS | Status: AC
  Filled 2022-02-08: qty 2

## 2022-02-08 MED ORDER — LABETALOL HCL 5 MG/ML IV SOLN: 10.0000 mg | INTRAVENOUS | Status: AC | PRN

## 2022-02-08 MED ORDER — SODIUM CHLORIDE 0.9% FLUSH
10.0000 mL | Freq: Two times a day (BID) | INTRAVENOUS | Status: DC
  Administered 2022-02-08 – 2022-02-09 (×2): 10 mL

## 2022-02-08 MED ORDER — ASPIRIN 81 MG PO CHEW
324.0000 mg | CHEWABLE_TABLET | Freq: Once | ORAL | Status: AC
  Administered 2022-02-08: 324 mg via ORAL
  Filled 2022-02-08: qty 4

## 2022-02-08 MED ORDER — CLOPIDOGREL BISULFATE 75 MG PO TABS
75.0000 mg | ORAL_TABLET | Freq: Every day | ORAL | Status: DC
  Administered 2022-02-08 – 2022-02-09 (×2): 75 mg via ORAL
  Filled 2022-02-08 (×2): qty 1

## 2022-02-08 MED ORDER — HEPARIN SODIUM (PORCINE) 1000 UNIT/ML IJ SOLN
INTRAMUSCULAR | Status: AC
  Filled 2022-02-08: qty 10

## 2022-02-08 MED ORDER — MIDAZOLAM HCL 2 MG/2ML IJ SOLN
INTRAMUSCULAR | Status: AC
  Filled 2022-02-08: qty 2

## 2022-02-08 MED ORDER — SODIUM CHLORIDE 0.9% FLUSH: 10.0000 mL | INTRAVENOUS | Status: DC | PRN

## 2022-02-08 MED ORDER — FENTANYL CITRATE (PF) 100 MCG/2ML IJ SOLN
INTRAMUSCULAR | Status: DC | PRN
  Administered 2022-02-08: 25 ug via INTRAVENOUS

## 2022-02-08 MED ORDER — CLOPIDOGREL BISULFATE 300 MG PO TABS
ORAL_TABLET | ORAL | Status: AC
  Filled 2022-02-08: qty 1

## 2022-02-08 MED ORDER — SODIUM CHLORIDE 0.9% FLUSH: 3.0000 mL | Freq: Two times a day (BID) | INTRAVENOUS | Status: DC

## 2022-02-08 MED ORDER — CHLORHEXIDINE GLUCONATE CLOTH 2 % EX PADS
6.0000 | MEDICATED_PAD | Freq: Every day | CUTANEOUS | Status: DC
  Administered 2022-02-09: 6 via TOPICAL

## 2022-02-08 MED ORDER — EZETIMIBE 10 MG PO TABS
10.0000 mg | ORAL_TABLET | Freq: Every day | ORAL | Status: DC
  Administered 2022-02-08 – 2022-02-09 (×2): 10 mg via ORAL
  Filled 2022-02-08 (×2): qty 1

## 2022-02-08 MED ORDER — SODIUM CHLORIDE 0.9 % WEIGHT BASED INFUSION: 1.0000 mL/kg/h | INTRAVENOUS | Status: DC

## 2022-02-08 MED ORDER — CLOPIDOGREL BISULFATE 300 MG PO TABS
ORAL_TABLET | ORAL | Status: DC | PRN
  Administered 2022-02-08: 300 mg via ORAL

## 2022-02-08 MED ORDER — HYDRALAZINE HCL 20 MG/ML IJ SOLN: 10.0000 mg | INTRAMUSCULAR | Status: AC | PRN

## 2022-02-08 MED ORDER — ENOXAPARIN SODIUM 40 MG/0.4ML IJ SOSY
40.0000 mg | PREFILLED_SYRINGE | INTRAMUSCULAR | Status: DC
  Administered 2022-02-09: 40 mg via SUBCUTANEOUS
  Filled 2022-02-08: qty 0.4

## 2022-02-08 MED ORDER — LIDOCAINE HCL (PF) 1 % IJ SOLN
INTRAMUSCULAR | Status: AC
  Filled 2022-02-08: qty 30

## 2022-02-08 MED ORDER — VERAPAMIL HCL 2.5 MG/ML IV SOLN
INTRAVENOUS | Status: DC | PRN
  Administered 2022-02-08: 10 mL via INTRA_ARTERIAL

## 2022-02-08 MED ORDER — SODIUM CHLORIDE 0.9 % IV SOLN
INTRAVENOUS | Status: AC | PRN
  Administered 2022-02-08: 10 mL/h via INTRAVENOUS

## 2022-02-08 MED ORDER — ASPIRIN 81 MG PO TBEC
81.0000 mg | DELAYED_RELEASE_TABLET | Freq: Every day | ORAL | Status: DC
  Administered 2022-02-08 – 2022-02-09 (×2): 81 mg via ORAL
  Filled 2022-02-08 (×2): qty 1

## 2022-02-08 MED ORDER — SODIUM CHLORIDE 0.9% FLUSH
3.0000 mL | Freq: Two times a day (BID) | INTRAVENOUS | Status: DC
  Administered 2022-02-09: 3 mL via INTRAVENOUS

## 2022-02-08 MED ORDER — SODIUM CHLORIDE 0.9 % WEIGHT BASED INFUSION: 3.0000 mL/kg/h | INTRAVENOUS | Status: DC

## 2022-02-08 MED ORDER — HEPARIN (PORCINE) IN NACL 1000-0.9 UT/500ML-% IV SOLN
INTRAVENOUS | Status: AC
  Filled 2022-02-08: qty 1000

## 2022-02-08 MED ORDER — ONDANSETRON HCL 4 MG/2ML IJ SOLN: 4.0000 mg | Freq: Four times a day (QID) | INTRAMUSCULAR | Status: DC | PRN

## 2022-02-08 MED ORDER — FENTANYL CITRATE (PF) 100 MCG/2ML IJ SOLN
INTRAMUSCULAR | Status: AC
  Filled 2022-02-08: qty 2

## 2022-02-08 SURGICAL SUPPLY — 32 items
BALL SAPPHIRE NC24 2.25X10 (BALLOONS) ×1
BALL SAPPHIRE NC24 2.50X12 (BALLOONS) ×1
BALL SAPPHIRE NC24 2.50X18 (BALLOONS) ×1
BALLN EMERGE MR 2.0X12 (BALLOONS) ×1
BALLN EUPHORA RX 1.5X12 (BALLOONS) ×1
BALLOON EMERGE MR 2.0X12 (BALLOONS) IMPLANT
BALLOON EUPHORA RX 1.5X12 (BALLOONS) IMPLANT
BALLOON SAPPHIRE NC24 2.25X10 (BALLOONS) IMPLANT
BALLOON SAPPHIRE NC24 2.50X12 (BALLOONS) IMPLANT
BALLOON SAPPHIRE NC24 2.50X18 (BALLOONS) IMPLANT
CATH 5FR JL3.5 JR4 ANG PIG MP (CATHETERS) IMPLANT
CATH LAUNCHER 6FR JR4 (CATHETERS) IMPLANT
CATH VISTA GUIDE 6FR XB3.5 (CATHETERS) IMPLANT
DEVICE RAD COMP TR BAND LRG (VASCULAR PRODUCTS) IMPLANT
ELECT DEFIB PAD ADLT CADENCE (PAD) IMPLANT
GLIDESHEATH SLEND A-KIT 6F 22G (SHEATH) IMPLANT
GUIDEWIRE INQWIRE 1.5J.035X260 (WIRE) IMPLANT
INQWIRE 1.5J .035X260CM (WIRE) ×1
KIT ENCORE 26 ADVANTAGE (KITS) IMPLANT
KIT HEART LEFT (KITS) ×1 IMPLANT
PACK CARDIAC CATHETERIZATION (CUSTOM PROCEDURE TRAY) ×1 IMPLANT
STENT ONYX FRONTIER 2.0X12 (Permanent Stent) IMPLANT
STENT ONYX FRONTIER 2.0X15 (Permanent Stent) IMPLANT
STENT SYNERGY XD 2.25X24 (Permanent Stent) IMPLANT
STENT SYNERGY XD 2.50X24 (Permanent Stent) IMPLANT
SYNERGY XD 2.25X24 (Permanent Stent) ×1 IMPLANT
SYNERGY XD 2.50X24 (Permanent Stent) ×1 IMPLANT
TRANSDUCER W/STOPCOCK (MISCELLANEOUS) ×1 IMPLANT
TUBING CIL FLEX 10 FLL-RA (TUBING) ×1 IMPLANT
WIRE COUGAR XT STRL 190CM (WIRE) IMPLANT
WIRE RUNTHROUGH .014X180CM (WIRE) IMPLANT
WIRE RUNTHROUGH IZANAI 014 180 (WIRE) IMPLANT

## 2022-02-08 NOTE — H&P (View-Only) (Signed)
Rounding Note    Patient Name: Marvin Johnson Date of Encounter: 02/08/2022  [place] Cardiologist: None   Subjective   No chest pain this morning.   Inpatient Medications    Scheduled Meds:  aspirin EC  81 mg Oral Daily   Chlorhexidine Gluconate Cloth  6 each Topical Daily   clopidogrel  75 mg Oral Daily   ezetimibe  10 mg Oral Daily   metoprolol tartrate  12.5 mg Oral BID   morphine  15 mg Oral Q12H   sodium chloride flush  10-40 mL Intracatheter Q12H   Continuous Infusions:  heparin 1,100 Units/hr (02/08/22 0243)   PRN Meds: acetaminophen, nitroGLYCERIN, ondansetron (ZOFRAN) IV, senna, sodium chloride flush   Vital Signs    Vitals:   02/08/22 0400 02/08/22 0740 02/08/22 0741 02/08/22 0742  BP: 137/76 124/75    Pulse: 100 73 72 78  Resp: '20 16 16 17  '$ Temp: 98.5 F (36.9 C)   98.7 F (37.1 C)  TempSrc:      SpO2: 97% 96% 96% 96%  Weight:      Height:       No intake or output data in the 24 hours ending 02/08/22 0925    02/07/2022   11:45 PM 02/03/2022   11:20 AM 02/01/2022    9:00 AM  Last 3 Weights  Weight (lbs) 194 lb 194 lb 194 lb 1.3 oz  Weight (kg) 87.998 kg 87.998 kg 88.034 kg      Telemetry    Sinus Rhythm - Personally Reviewed  ECG    Sinus Rhythm, 44m STE in lead III, aVR, STD lead I, II, aVL, v2 - Personally Reviewed  Physical Exam   GEN: No acute distress.   Neck: No JVD Cardiac: RRR, no murmurs, rubs, or gallops.  Respiratory: Clear to auscultation bilaterally. GI: Soft, nontender, non-distended  MS: No edema; No deformity. Neuro:  Nonfocal  Psych: Normal affect   Labs    High Sensitivity Troponin:   Recent Labs  Lab 02/07/22 2358 02/08/22 0213  TROPONINIHS 300* 375*     Chemistry Recent Labs  Lab 02/07/22 2358  NA 139  K 3.8  CL 105  CO2 23  GLUCOSE 182*  BUN 16  CREATININE 0.88  CALCIUM 9.4  GFRNONAA >60  ANIONGAP 11    Lipids No results for input(s): "CHOL", "TRIG", "HDL",  "LABVLDL", "LDLCALC", "CHOLHDL" in the last 168 hours.  Hematology Recent Labs  Lab 02/07/22 2358 02/08/22 0838  WBC 5.8 9.3  RBC 3.79* 3.65*  HGB 11.3* 10.9*  HCT 35.3* 33.9*  MCV 93.1 92.9  MCH 29.8 29.9  MCHC 32.0 32.2  RDW 15.9* 15.9*  PLT 270 269   Thyroid No results for input(s): "TSH", "FREET4" in the last 168 hours.  BNP Recent Labs  Lab 02/07/22 2358  BNP 81.6    DDimer No results for input(s): "DDIMER" in the last 168 hours.   Radiology    DG Chest 2 View  Result Date: 02/08/2022 CLINICAL DATA:  Chest pain EXAM: CHEST - 2 VIEW COMPARISON:  11/20/2021 FINDINGS: Cardiac shadow is mildly enlarged. The lungs are well aerated without focal infiltrate or effusion. Right chest wall port is seen. No bony abnormality is noted. IMPRESSION: No acute abnormality noted Electronically Signed   By: MInez CatalinaM.D.   On: 02/08/2022 00:14    Cardiac Studies   Echo pending  Patient Profile     71y.o. male  with CAD s/p RCA PCI (  50% Lcx, OM! 30% treated medical on last cath 2020), htn, hld, prostate cancer who was seen 02/08/2022 for the evaluation of chest pain/NSTEMI.   Assessment & Plan    NSTEMI -- hsTn 300>>375, has been having unstable angina over the past 2-3 weeks. EKG shows concern for 1 mm STE in inferior leads, with ST depression in lateral leads. Plans for cardiac cath today -- remains on IV heparin,  ASA, plavix, metoprolol, Zetia -- echo pending  Shared Decision Making/Informed Consent The risks [stroke (1 in 1000), death (1 in 1000), kidney failure [usually temporary] (1 in 500), bleeding (1 in 200), allergic reaction [possibly serious] (1 in 200)], benefits (diagnostic support and management of coronary artery disease) and alternatives of a cardiac catheterization were discussed in detail with Mr. Mom and he is willing to proceed.   HTN -- stable -- continue metoprolol 12.'5mg'$  BID  HLD -- statin intolerant -- continue Zetia, has been on praluent  but not taken in several weeks  Prostate CA  -- followed in [place], initially dx 2015 -- s/p 4 weeks of chemo  For questions or updates, please contact [place] Please consult [email or web address] for contact info under        Signed, Reino Bellis, NP  02/08/2022, 9:25 AM   Agree with note by Reino Bellis NP-C  Patient with known history of CAD status post multiple interventions in the past admitted with unstable angina/non-STEMI.  His other t problems include history of hypertension and hyperlipidemia.  He is currently pain-free on IV heparin.  Vital signs are stable.'s EKG did show subtle ST segment depression inferolaterally.  His exam is benign.  Plan diagnostic coronary angiogram today. The patient understands that risks included but are not limited to stroke (1 in 1000), death (1 in 80), kidney failure [usually temporary] (1 in 500), bleeding (1 in 200), allergic reaction [possibly serious] (1 in 200).  The patient understands and agrees to proceed   Lorretta Harp, M.D., [place], [facility], [place], [place] [address] [place], [place]  [address]  [phone] 02/08/2022 9:53 AM

## 2022-02-08 NOTE — ED Provider Notes (Signed)
[facility] EMERGENCY DEPARTMENT Provider Note   CSN: [id number] Arrival date & time: 02/07/22  2317     History  Chief Complaint  Patient presents with   Chest Pain    Marvin Johnson is a 71 y.o. male.  71 yo M w/ long h/o ACS s/p multiple stents here with a week or so of chest pain. Worse with exertion. Some dyspnea. Better with rest and NTG, getting more and more frequent so came here for eval. No cough, fever, nausea, vomiting or other associated symptoms.    Chest Pain      Home Medications Prior to Admission medications   Medication Sig Start Date End Date Taking? Authorizing Provider  Alirocumab (PRALUENT Talty) Inject into the skin. One injection every 2 weeks Monday   Yes [provider]  aspirin 81 MG EC tablet Take 81 mg by mouth daily.   Yes [provider]  Cholecalciferol 25 MCG (1000 UT) tablet Take 1,000 Units by mouth in the morning and at bedtime.   Yes [provider]  clopidogrel (PLAVIX) 75 MG tablet Take 75 mg by mouth daily.   Yes [provider]  dexamethasone (DECADRON) 4 MG tablet Take 8 mg by mouth See admin instructions. Take two tablets by mouth twice a day (start the day before chemo, then take daily for 2 days starting te day after chemo; take with food) 01/08/22  Yes [provider]  diphenhydrAMINE (SOMINEX) 25 MG tablet Take 50 mg by mouth at bedtime.   Yes [provider]  ezetimibe (ZETIA) 10 MG tablet Take 10 mg by mouth daily. 11/14/19  Yes [provider]  leuprolide (LUPRON) 22.5 MG injection Inject 22.5 mg into the muscle every 3 (three) months.   Yes [provider]  metoprolol tartrate (LOPRESSOR) 25 MG tablet Take 12.5 mg by mouth 2 (two) times daily. 11/13/20  Yes [provider]  morphine (MS CONTIN) 15 MG 12 hr tablet Take 1 tablet (15 mg total) by mouth every 12 (twelve) hours. 01/26/22  Yes Mosher, Vida Roller A, PA-C  Multiple  Vitamins-Minerals (MULTIVITAMIN WITH MINERALS) tablet Take 1 tablet by mouth daily.   Yes [provider]  nitroGLYCERIN (NITROSTAT) 0.4 MG SL tablet Place 0.4 mg under the tongue every 5 (five) minutes as needed for chest pain. Repeat every 5 minutes if needed for a total of 3 tablets in 15 minutes. If no relief, CALL 911. 04/09/21  Yes [provider]  Omega-3 Fatty Acids (FISH OIL) 1000 MG CAPS Take 2,000 mg by mouth in the morning and at bedtime.   Yes [provider]  predniSONE (DELTASONE) 5 MG tablet Take 1 tablet (5 mg total) by mouth in the morning and at bedtime. 11/26/21  Yes Mosher, Thalia Bloodgood, PA-C  Royal Jelly-Bee Pollen-Ginseng (KOREAN GINSENG COMPLEX) 150-250-50 MG CAPS Take 1 capsule by mouth daily.   Yes [provider]  senna (SENOKOT) 8.6 MG TABS tablet Take 4 tablets by mouth 2 (two) times daily as needed for moderate constipation.   Yes [provider]  Zoledronic Acid (ZOMETA) 4 MG/100ML IVPB Inject 4 mg into the vein every 3 (three) months.   Yes [provider]  isosorbide mononitrate (IMDUR) 30 MG 24 hr tablet Take 1 tablet by mouth every 8 (eight) hours as needed. Patient not taking: Reported on 02/08/2022 03/23/18   [provider]  ondansetron (ZOFRAN) 8 MG tablet Take 1 tablet (8 mg total) by mouth every 8 (eight)  hours as needed for nausea or vomiting. Patient not taking: Reported on 02/08/2022 11/26/21   Rosanne Sack A, PA-C  prochlorperazine (COMPAZINE) 10 MG tablet Take 1 tablet (10 mg total) by mouth every 6 (six) hours as needed for nausea or vomiting. Patient not taking: Reported on 02/08/2022 11/26/21   Rosanne Sack A, PA-C      Allergies    Atorvastatin, Rosuvastatin, Simvastatin, Sulfa antibiotics, and Sulfamethoxazole-trimethoprim    Review of Systems   Review of Systems  Cardiovascular:  Positive for chest pain.    Physical Exam Updated Vital Signs BP (!) 140/84   Pulse 99   Temp 99 F (37.2  C) (Oral)   Resp 19   Ht 5' 5.5" (1.664 m)   Wt 88 kg   SpO2 96%   BMI 31.79 kg/m  Physical Exam Vitals and nursing note reviewed.  Constitutional:      Appearance: He is well-developed.  HENT:     Head: Normocephalic and atraumatic.  Cardiovascular:     Rate and Rhythm: Normal rate.  Pulmonary:     Effort: Pulmonary effort is normal. No respiratory distress.  Chest:     Chest wall: No tenderness.  Abdominal:     General: There is no distension.     Palpations: Abdomen is soft.  Musculoskeletal:        General: Normal range of motion.     Cervical back: Normal range of motion.  Skin:    General: Skin is warm and dry.  Neurological:     Mental Status: He is alert.     ED Results / Procedures / Treatments   Labs (all labs ordered are listed, but only abnormal results are displayed) Labs Reviewed  CBC WITH DIFFERENTIAL/PLATELET - Abnormal; Notable for the following components:      Result Value   RBC 3.79 (*)    Hemoglobin 11.3 (*)    HCT 35.3 (*)    RDW 15.9 (*)    nRBC 1.2 (*)    Abs Immature Granulocytes 0.34 (*)    All other components within normal limits  BASIC METABOLIC PANEL - Abnormal; Notable for the following components:   Glucose, Bld 182 (*)    All other components within normal limits  TROPONIN I (HIGH SENSITIVITY) - Abnormal; Notable for the following components:   Troponin I (High Sensitivity) 300 (*)    All other components within normal limits  TROPONIN I (HIGH SENSITIVITY) - Abnormal; Notable for the following components:   Troponin I (High Sensitivity) 375 (*)    All other components within normal limits  BRAIN NATRIURETIC PEPTIDE  PATHOLOGIST SMEAR REVIEW  HEPARIN LEVEL (UNFRACTIONATED)  CBC  LIPOPROTEIN A (LPA)  BASIC METABOLIC PANEL  LIPID PANEL  CBC  PROTIME-INR  HEMOGLOBIN A1C    EKG EKG Interpretation  Date/Time:  Sunday February 07 2022 23:27:56 EST Ventricular Rate:  104 PR Interval:  198 QRS Duration: 92 QT  Interval:  328 QTC Calculation: 431 R Axis:   65 Text Interpretation: Sinus tachycardia Inferior-posterior infarct , age undetermined Marked ST abnormality, possible lateral subendocardial injury Abnormal ECG No previous ECGs available Confirmed by Merrily Pew [phone]) on 02/08/2022 2:22:31 AM  Radiology DG Chest 2 View  Result Date: 02/08/2022 CLINICAL DATA:  Chest pain EXAM: CHEST - 2 VIEW COMPARISON:  11/20/2021 FINDINGS: Cardiac shadow is mildly enlarged. The lungs are well aerated without focal infiltrate or effusion. Right chest wall port is seen. No bony abnormality is noted. IMPRESSION: No acute abnormality noted  Electronically Signed   By: Inez Catalina M.D.   On: 02/08/2022 00:14    Procedures .Critical Care  Performed by: Merrily Pew, MD Authorized by: Merrily Pew, MD   Critical care provider statement:    Critical care time (minutes):  30   Critical care was necessary to treat or prevent imminent or life-threatening deterioration of the following conditions:  Cardiac failure   Critical care was time spent personally by me on the following activities:  Development of treatment plan with patient or surrogate, discussions with consultants, evaluation of patient's response to treatment, examination of patient, ordering and review of laboratory studies, ordering and review of radiographic studies, ordering and performing treatments and interventions, pulse oximetry, re-evaluation of patient's condition and review of old charts     Medications Ordered in ED Medications  heparin ADULT infusion 100 units/mL (25000 units/245m) (1,100 Units/hr Intravenous New Bag/Given 02/08/22 0243)  sodium chloride flush (NS) 0.9 % injection 10-40 mL ( Intracatheter Not Given 02/08/22 0336)  sodium chloride flush (NS) 0.9 % injection 10-40 mL (has no administration in time range)  Chlorhexidine Gluconate Cloth 2 % PADS 6 each (has no administration in time range)  aspirin EC tablet 81 mg (has no  administration in time range)  morphine (MS CONTIN) 12 hr tablet 15 mg (has no administration in time range)  ezetimibe (ZETIA) tablet 10 mg (has no administration in time range)  metoprolol tartrate (LOPRESSOR) tablet 12.5 mg (has no administration in time range)  senna (SENOKOT) tablet 34.4 mg (has no administration in time range)  clopidogrel (PLAVIX) tablet 75 mg (has no administration in time range)  nitroGLYCERIN (NITROSTAT) SL tablet 0.4 mg (has no administration in time range)  acetaminophen (TYLENOL) tablet 650 mg (has no administration in time range)  ondansetron (ZOFRAN) injection 4 mg (has no administration in time range)  aspirin chewable tablet 324 mg (324 mg Oral Given 02/08/22 0231)  heparin bolus via infusion 4,000 Units (4,000 Units Intravenous Bolus from Bag 02/08/22 0244)    ED Course/ Medical Decision Making/ A&P                           Medical Decision Making Risk OTC drugs. Decision regarding hospitalization.   71yo M here with NSTEMI likely starting around a week ago. Trop high. Started heparin. ASA given. Consulted cards for admission.   Final Clinical Impression(s) / ED Diagnoses Final diagnoses:  NSTEMI (non-ST elevated myocardial infarction) ([facility]    Rx / [email or web address] Dis[place] Orders     None         Henrietta Cieslewicz, JCorene Cornea MD 02/08/22 [phone]

## 2022-02-08 NOTE — H&P (Signed)
Cardiology Admission History and Physical   Patient ID: Marvin Johnson MRN: [id number]; DOB: May 13, 1950   Admission date: 02/07/2022  PCP:  Earline Mayotte, MD   [place] [facility] Cardiologist:  None        Chief Complaint:  chest pain and shortness of breath  Patient Profile:   Marvin Johnson is a 71 y.o. male with CAD (prior PCI), htn, hld, prostate cancer who is being seen 02/08/2022 for the evaluation of chest pain/NSTEMI.  History of Present Illness:   Marvin Johnson is a 71 yo male with CAD (prior PCI), HTN, HLD, prostate cancer who presents to the emergency department with chest pain. Notes that he has been having chest pain and shortness of breath over the last week that has been progressive. Yesterday was his worse day of symptoms. He took 3 SL NTG without relief and thus presented to the ED. Denies any other symptoms.   On arrival, he was hemodynamically stable.  Initial troponin 300.  ECG with Q waves inferiorly and global ST depression with elevation in aVR.  Started on heparin in the emergency department.  He is under active treatment for prostate cancer.  No infectious symptoms.  Hemoglobin has been stable.  No known metastasis to his brain.   Past Medical History:  Diagnosis Date   Hyperlipidemia    Malignant neoplasm of prostate ([place])    Supraventricular tachycardia     Past Surgical History:  Procedure Laterality Date   PROSTATECTOMY  07/2014   TONSILLECTOMY       Medications Prior to Admission: Prior to Admission medications   Medication Sig Start Date End Date Taking? Authorizing Provider  Alirocumab (PRALUENT [place]) Inject into the skin. One injection every 2 weeks Monday   Yes [provider]  aspirin 81 MG EC tablet Take 81 mg by mouth daily.   Yes [provider]  Cholecalciferol 25 MCG (1000 UT) tablet Take 1,000 Units by mouth in the morning and at bedtime.   Yes [provider]  clopidogrel  (PLAVIX) 75 MG tablet Take 75 mg by mouth daily.   Yes [provider]  dexamethasone (DECADRON) 4 MG tablet TAKE TWO TABLETS BY MOUTH TWICE A DAY (START THE DAY BEFORE CHEMO THEN TAKE DAILY FOR 2 DAYS STARTING THE DAY AFTER CHEMO; TAKE WITH FOOD) 01/08/22  Yes [provider]  diphenhydrAMINE (SOMINEX) 25 MG tablet Take 50 mg by mouth at bedtime.   Yes [provider]  ezetimibe (ZETIA) 10 MG tablet Take 10 mg by mouth daily. 11/14/19  Yes [provider]  leuprolide (LUPRON) 22.5 MG injection Inject 22.5 mg into the muscle every 3 (three) months.   Yes [provider]  isosorbide mononitrate (IMDUR) 30 MG 24 hr tablet Take 1 tablet by mouth every 8 (eight) hours as needed. 03/23/18   [provider]  metoprolol tartrate (LOPRESSOR) 25 MG tablet TAKE ONE-HALF TABLET BY MOUTH TWICE A DAY FOR HEART 11/13/20   [provider]  morphine (MS CONTIN) 15 MG 12 hr tablet Take 1 tablet (15 mg total) by mouth every 12 (twelve) hours. 01/26/22   Mosher, Vida Roller A, PA-C  Multiple Vitamins-Minerals (MULTIVITAMIN WITH MINERALS) tablet Take 1 tablet by mouth daily.    [provider]  nitroGLYCERIN (NITROSTAT) 0.4 MG SL tablet DISSOLVE ONE TABLET UNDER THE TONGUE EVERY 5 MINUTES AS NEEDED FOR CHEST PAIN (REPEAT EVERY 5 MINUTES IF NEEDED FOR A TOTAL OF 3 TABLETS IN 15 MINUTES. IF NO  RELIEF, CALL 911.) 04/09/21   [provider]  Omega-3 Fatty Acids (FISH OIL) 1000 MG CAPS Take by mouth.    [provider]  ondansetron (ZOFRAN) 8 MG tablet Take 1 tablet (8 mg total) by mouth every 8 (eight) hours as needed for nausea or vomiting. 11/26/21   Mosher, Vida Roller A, PA-C  predniSONE (DELTASONE) 5 MG tablet Take 1 tablet (5 mg total) by mouth in the morning and at bedtime. 11/26/21   Mosher, Vida Roller A, PA-C  prochlorperazine (COMPAZINE) 10 MG tablet Take 1 tablet (10 mg total) by mouth every 6 (six) hours as needed for nausea or vomiting. 11/26/21    Mosher, Thalia Bloodgood, PA-C  Royal Jelly-Bee Pollen-Ginseng (KOREAN GINSENG COMPLEX) 150-250-50 MG CAPS Take by mouth.    [provider]  senna (SENOKOT) 8.6 MG TABS tablet Take 1 tablet by mouth.    [provider]  Zoledronic Acid (ZOMETA) 4 MG/100ML IVPB Inject 4 mg into the vein.    [provider]     Allergies:    Allergies  Allergen Reactions   Atorvastatin    Rosuvastatin Other (See Comments)    Other reaction(s): Muscle pain   Simvastatin Other (See Comments)    Other reaction(s): Joint pain, Muscle pain   Sulfa Antibiotics    Sulfamethoxazole-Trimethoprim Other (See Comments)    Unknown as to interaction was a baby.    Social History:   Social History   Socioeconomic History   Marital status: Divorced    Spouse name: Not on file   Number of children: Not on file   Years of education: Not on file   Highest education level: Not on file  Occupational History   Not on file  Tobacco Use   Smoking status: Never   Smokeless tobacco: Never  Substance and Sexual Activity   Alcohol use: Not Currently   Drug use: Never   Sexual activity: Not on file  Other Topics Concern   Not on file  Social History Narrative   Not on file   Social Determinants of Health   Financial Resource Strain: Not on file  Food Insecurity: Not on file  Transportation Needs: Not on file  Physical Activity: Not on file  Stress: Not on file  Social Connections: Not on file  Intimate Partner Violence: Not on file    Family History:   The patient's family history includes Breast cancer (age of onset: 45) in his paternal grandmother; Breast cancer (age of onset: 83) in his mother; Ovarian cancer (age of onset: 66) in his mother.    ROS:  Please see the history of present illness.  All other ROS reviewed and negative.     Physical Exam/Data:   Vitals:   02/08/22 0203 02/08/22 0205 02/08/22 0230 02/08/22 0400  BP: (!) 142/75  (!) 140/84 137/76  Pulse: 95 97 99 100   Resp: (!) '23 19 19 20  '$ Temp:    98.5 F (36.9 C)  TempSrc:      SpO2: 95% 96% 96% 97%  Weight:      Height:       No intake or output data in the 24 hours ending 02/08/22 0418    02/07/2022   11:45 PM 02/03/2022   11:20 AM 02/01/2022    9:00 AM  Last 3 Weights  Weight (lbs) 194 lb 194 lb 194 lb 1.3 oz  Weight (kg) 87.998 kg 87.998 kg 88.034 kg     Body mass index is 31.79 kg/m.  General:   in no acute distress  HEENT: normal Neck: no JVD Vascular: No carotid bruits; Distal pulses 2+ bilaterally   Cardiac:  normal S1, S2; RRR; no murmur  Lungs:  clear to auscultation bilaterally, no wheezing, rhonchi or rales  Abd: soft, nontender, no hepatomegaly  Ext: no edema Musculoskeletal:  No deformities, BUE and BLE strength normal and equal Skin: warm and dry  Neuro:  CNs 2-12 intact, no focal abnormalities noted Psych:  Normal affect    EKG:  The ECG that was done  was personally reviewed and demonstrates U waves in the inferior leads, ST elevation in aVR, and ST depression globally.  Relevant CV Studies: None  Laboratory Data:  High Sensitivity Troponin:   Recent Labs  Lab 02/07/22 2358 02/08/22 0213  TROPONINIHS 300* 375*      Chemistry Recent Labs  Lab 02/07/22 2358  NA 139  K 3.8  CL 105  CO2 23  GLUCOSE 182*  BUN 16  CREATININE 0.88  CALCIUM 9.4  GFRNONAA >60  ANIONGAP 11    No results for input(s): "PROT", "ALBUMIN", "AST", "ALT", "ALKPHOS", "BILITOT" in the last 168 hours. Lipids No results for input(s): "CHOL", "TRIG", "HDL", "LABVLDL", "LDLCALC", "CHOLHDL" in the last 168 hours. Hematology Recent Labs  Lab 02/07/22 2358  WBC 5.8  RBC 3.79*  HGB 11.3*  HCT 35.3*  MCV 93.1  MCH 29.8  MCHC 32.0  RDW 15.9*  PLT 270   Thyroid No results for input(s): "TSH", "FREET4" in the last 168 hours. BNP Recent Labs  Lab 02/07/22 2358  BNP 81.6    DDimer No results for input(s): "DDIMER" in the last 168 hours.   Radiology/Studies:  DG Chest  2 View  Result Date: 02/08/2022 CLINICAL DATA:  Chest pain EXAM: CHEST - 2 VIEW COMPARISON:  11/20/2021 FINDINGS: Cardiac shadow is mildly enlarged. The lungs are well aerated without focal infiltrate or effusion. Right chest wall port is seen. No bony abnormality is noted. IMPRESSION: No acute abnormality noted Electronically Signed   By: Inez Catalina M.D.   On: 02/08/2022 00:14     Assessment and Plan:   #NSTEMI - npo after midnight for lhc tomorrow - echo ordered for tomorrow - heparin for anticoagulation - loaded 325 mg ASA; continue 81 mg daily  - chronically on plavix; continued plavix  - not tolerant of statins, on zetia and praluent though has not taken praluent in 3 weeks; check lipid panel and LP(a) - continue metoprolol - will check CBC, CMP, INR, hemoglobin A1c, - referral for cardiac rehab - admit for cardiac tele; strict I&Os; daily weights  - sublingual NTG PRN for pain - can escalate to nitro gtt if needed   # chronic pain - continue ms contin 15 mg q12h    Risk Assessment/Risk Scores:    TIMI Risk Score for Unstable Angina or Non-ST Elevation MI:   The patient's TIMI risk score is 7, which indicates a 41% risk of all cause mortality, new or recurrent myocardial infarction or need for urgent revascularization in the next 14 days.{       Severity of Illness: The appropriate patient status for this patient is INPATIENT. Inpatient status is judged to be reasonable and necessary in order to provide the required intensity of service to ensure the patient's safety. The patient's presenting symptoms, physical exam findings, and initial radiographic and laboratory data in the context of their chronic comorbidities is felt to place them at high risk for further clinical deterioration.  Furthermore, it is not anticipated that the patient will be medically stable for discharge from the hospital within 2 midnights of admission.   * I certify that at the point of admission it  is my clinical judgment that the patient will require inpatient hospital care spanning beyond 2 midnights from the point of admission due to high intensity of service, high risk for further deterioration and high frequency of surveillance required.*   For questions or updates, please contact [place] Please consult [email or web address] for contact info under     Signed, Doyne Keel, MD  02/08/2022 4:18 AM

## 2022-02-08 NOTE — Progress Notes (Signed)
  Echocardiogram 2D Echocardiogram has been performed.  Marvin Johnson 02/08/2022, 12:06 PM

## 2022-02-08 NOTE — Interval H&P Note (Signed)
History and Physical Interval Note:  02/08/2022 2:17 PM  Marvin Johnson  has presented today for surgery, with the diagnosis of NSTEMI.  The various methods of treatment have been discussed with the patient and family. After consideration of risks, benefits and other options for treatment, the patient has consented to  Procedure(s): LEFT HEART CATH AND CORONARY ANGIOGRAPHY (N/A) as a surgical intervention.  The patient's history has been reviewed, patient examined, no change in status, stable for surgery.  I have reviewed the patient's chart and labs.  Questions were answered to the patient's satisfaction.    Cath Lab Visit (complete for each Cath Lab visit)  Clinical Evaluation Leading to the Procedure:   ACS: Yes.    Non-ACS:  N/A  Amarissa Koerner

## 2022-02-08 NOTE — Progress Notes (Addendum)
Rounding Note    Patient Name: Marvin Johnson Date of Encounter: 02/08/2022  [place] Cardiologist: None   Subjective   No chest pain this morning.   Inpatient Medications    Scheduled Meds:  aspirin EC  81 mg Oral Daily   Chlorhexidine Gluconate Cloth  6 each Topical Daily   clopidogrel  75 mg Oral Daily   ezetimibe  10 mg Oral Daily   metoprolol tartrate  12.5 mg Oral BID   morphine  15 mg Oral Q12H   sodium chloride flush  10-40 mL Intracatheter Q12H   Continuous Infusions:  heparin 1,100 Units/hr (02/08/22 0243)   PRN Meds: acetaminophen, nitroGLYCERIN, ondansetron (ZOFRAN) IV, senna, sodium chloride flush   Vital Signs    Vitals:   02/08/22 0400 02/08/22 0740 02/08/22 0741 02/08/22 0742  BP: 137/76 124/75    Pulse: 100 73 72 78  Resp: '20 16 16 17  '$ Temp: 98.5 F (36.9 C)   98.7 F (37.1 C)  TempSrc:      SpO2: 97% 96% 96% 96%  Weight:      Height:       No intake or output data in the 24 hours ending 02/08/22 0925    02/07/2022   11:45 PM 02/03/2022   11:20 AM 02/01/2022    9:00 AM  Last 3 Weights  Weight (lbs) 194 lb 194 lb 194 lb 1.3 oz  Weight (kg) 87.998 kg 87.998 kg 88.034 kg      Telemetry    Sinus Rhythm - Personally Reviewed  ECG    Sinus Rhythm, 63m STE in lead III, aVR, STD lead I, II, aVL, v2 - Personally Reviewed  Physical Exam   GEN: No acute distress.   Neck: No JVD Cardiac: RRR, no murmurs, rubs, or gallops.  Respiratory: Clear to auscultation bilaterally. GI: Soft, nontender, non-distended  MS: No edema; No deformity. Neuro:  Nonfocal  Psych: Normal affect   Labs    High Sensitivity Troponin:   Recent Labs  Lab 02/07/22 2358 02/08/22 0213  TROPONINIHS 300* 375*     Chemistry Recent Labs  Lab 02/07/22 2358  NA 139  K 3.8  CL 105  CO2 23  GLUCOSE 182*  BUN 16  CREATININE 0.88  CALCIUM 9.4  GFRNONAA >60  ANIONGAP 11    Lipids No results for input(s): "CHOL", "TRIG", "HDL",  "LABVLDL", "LDLCALC", "CHOLHDL" in the last 168 hours.  Hematology Recent Labs  Lab 02/07/22 2358 02/08/22 0838  WBC 5.8 9.3  RBC 3.79* 3.65*  HGB 11.3* 10.9*  HCT 35.3* 33.9*  MCV 93.1 92.9  MCH 29.8 29.9  MCHC 32.0 32.2  RDW 15.9* 15.9*  PLT 270 269   Thyroid No results for input(s): "TSH", "FREET4" in the last 168 hours.  BNP Recent Labs  Lab 02/07/22 2358  BNP 81.6    DDimer No results for input(s): "DDIMER" in the last 168 hours.   Radiology    DG Chest 2 View  Result Date: 02/08/2022 CLINICAL DATA:  Chest pain EXAM: CHEST - 2 VIEW COMPARISON:  11/20/2021 FINDINGS: Cardiac shadow is mildly enlarged. The lungs are well aerated without focal infiltrate or effusion. Right chest wall port is seen. No bony abnormality is noted. IMPRESSION: No acute abnormality noted Electronically Signed   By: MInez CatalinaM.D.   On: 02/08/2022 00:14    Cardiac Studies   Echo pending  Patient Profile     71y.o. male  with CAD s/p RCA PCI (  50% Lcx, OM! 30% treated medical on last cath 2020), htn, hld, prostate cancer who was seen 02/08/2022 for the evaluation of chest pain/NSTEMI.   Assessment & Plan    NSTEMI -- hsTn 300>>375, has been having unstable angina over the past 2-3 weeks. EKG shows concern for 1 mm STE in inferior leads, with ST depression in lateral leads. Plans for cardiac cath today -- remains on IV heparin,  ASA, plavix, metoprolol, Zetia -- echo pending  Shared Decision Making/Informed Consent The risks [stroke (1 in 1000), death (1 in 1000), kidney failure [usually temporary] (1 in 500), bleeding (1 in 200), allergic reaction [possibly serious] (1 in 200)], benefits (diagnostic support and management of coronary artery disease) and alternatives of a cardiac catheterization were discussed in detail with Marvin Johnson and he is willing to proceed.   HTN -- stable -- continue metoprolol 12.'5mg'$  BID  HLD -- statin intolerant -- continue Zetia, has been on praluent  but not taken in several weeks  Prostate CA  -- followed in [place], initially dx 2015 -- s/p 4 weeks of chemo  For questions or updates, please contact [employer] Please consult [email or web address] for contact info under        Signed, Reino Bellis, NP  02/08/2022, 9:25 AM   Agree with note by Reino Bellis NP-C  Patient with known history of CAD status post multiple interventions in the past admitted with unstable angina/non-STEMI.  His other t problems include history of hypertension and hyperlipidemia.  He is currently pain-free on IV heparin.  Vital signs are stable.'s EKG did show subtle ST segment depression inferolaterally.  His exam is benign.  Plan diagnostic coronary angiogram today. The patient understands that risks included but are not limited to stroke (1 in 1000), death (1 in 73), kidney failure [usually temporary] (1 in 500), bleeding (1 in 200), allergic reaction [possibly serious] (1 in 200).  The patient understands and agrees to proceed   Lorretta Harp, M.D., [place], [facility], [place], [place] [address] [place], [place]  [id number]  [phone] 02/08/2022 9:53 AM

## 2022-02-08 NOTE — Progress Notes (Signed)
ANTICOAGULATION CONSULT NOTE - Initial Consult  Pharmacy Consult for heparin Indication: chest pain/ACS  Allergies  Allergen Reactions   Atorvastatin    Rosuvastatin Other (See Comments)    Other reaction(s): Muscle pain   Simvastatin Other (See Comments)    Other reaction(s): Joint pain, Muscle pain   Sulfa Antibiotics    Sulfamethoxazole-Trimethoprim Other (See Comments)    Unknown as to interaction was a baby.    Patient Measurements: Height: 5' 5.5" (166.4 cm) Weight: 88 kg (194 lb) IBW/kg (Calculated) : 62.65 Heparin Dosing Weight: 81 Kg  Vital Signs: Temp: 99 F (37.2 C) (11/26 2341) Temp Source: Oral (11/26 2341) BP: 142/75 (11/27 0203) Pulse Rate: 97 (11/27 0205)  Labs: Recent Labs    02/07/22 2358  HGB 11.3*  HCT 35.3*  PLT 270  CREATININE 0.88  TROPONINIHS 300*    Estimated Creatinine Clearance: 78.1 mL/min (by C-G formula based on SCr of 0.88 mg/dL).   Medical History: Past Medical History:  Diagnosis Date   Hyperlipidemia    Malignant neoplasm of prostate ([place])    Supraventricular tachycardia     Assessment: 71 yo male presenting with chest pain x1 week and exertional shortness of breath.Pt has history of multiple cardiac stents and currently bing treated for prostate cancer. No anticoagulation reported prior to admission. Hgb 11.3, PLT 270 Pharmacy to dose heparin.   Goal of Therapy:  Heparin level 0.3-0.7 units/ml Monitor platelets by anticoagulation protocol: Yes   Plan:  Give 4000 units bolus x 1 Start heparin infusion at 1100 units/hr Check anti-Xa level in 6 hours and daily while on heparin Continue to monitor H&H and platelets  Georga Bora, PharmD Clinical Pharmacist 02/08/2022 2:27 AM Please check AMION for all [place] numbers\

## 2022-02-09 ENCOUNTER — Encounter (HOSPITAL_COMMUNITY): Payer: Self-pay | Admitting: Internal Medicine

## 2022-02-09 ENCOUNTER — Other Ambulatory Visit: Payer: Non-veteran care

## 2022-02-09 ENCOUNTER — Other Ambulatory Visit: Payer: Self-pay | Admitting: Pharmacist

## 2022-02-09 DIAGNOSIS — C61 Malignant neoplasm of prostate: Secondary | ICD-10-CM

## 2022-02-09 DIAGNOSIS — I10 Essential (primary) hypertension: Secondary | ICD-10-CM | POA: Insufficient documentation

## 2022-02-09 DIAGNOSIS — C7951 Secondary malignant neoplasm of bone: Secondary | ICD-10-CM

## 2022-02-09 DIAGNOSIS — E785 Hyperlipidemia, unspecified: Secondary | ICD-10-CM | POA: Insufficient documentation

## 2022-02-09 DIAGNOSIS — I214 Non-ST elevation (NSTEMI) myocardial infarction: Secondary | ICD-10-CM | POA: Diagnosis not present

## 2022-02-09 LAB — BASIC METABOLIC PANEL
Anion gap: 7 (ref 5–15)
BUN: 11 mg/dL (ref 8–23)
CO2: 23 mmol/L (ref 22–32)
Calcium: 7.8 mg/dL — ABNORMAL LOW (ref 8.9–10.3)
Chloride: 106 mmol/L (ref 98–111)
Creatinine, Ser: 0.86 mg/dL (ref 0.61–1.24)
GFR, Estimated: >60 mL/min (ref >60)
Glucose, Bld: 101 mg/dL — ABNORMAL HIGH (ref 70–99)
Potassium: 3.6 mmol/L (ref 3.5–5.1)
Sodium: 136 mmol/L (ref 135–145)

## 2022-02-09 LAB — URINALYSIS, ROUTINE W REFLEX MICROSCOPIC
Bilirubin Urine: NEGATIVE
Glucose, UA: NEGATIVE mg/dL
Hgb urine dipstick: NEGATIVE
Ketones, ur: NEGATIVE mg/dL
Leukocytes,Ua: NEGATIVE
Nitrite: NEGATIVE
Protein, ur: NEGATIVE mg/dL
Specific Gravity, Urine: 1.012 (ref 1.005–1.030)
pH: 7 (ref 5.0–8.0)

## 2022-02-09 LAB — PATHOLOGIST SMEAR REVIEW

## 2022-02-09 LAB — CBC
HCT: 33.1 % — ABNORMAL LOW (ref 39.0–52.0)
HCT: 33.6 % — ABNORMAL LOW (ref 39.0–52.0)
Hemoglobin: 10.3 g/dL — ABNORMAL LOW (ref 13.0–17.0)
Hemoglobin: 10.6 g/dL — ABNORMAL LOW (ref 13.0–17.0)
MCH: 29.4 pg (ref 26.0–34.0)
MCH: 29.5 pg (ref 26.0–34.0)
MCHC: 31.1 g/dL (ref 30.0–36.0)
MCHC: 31.5 g/dL (ref 30.0–36.0)
MCV: 94.6 fL (ref 80.0–100.0)
MCV: 93.6 fL (ref 80.0–100.0)
Platelets: 272 10*3/uL (ref 150–400)
Platelets: 286 10*3/uL (ref 150–400)
RBC: 3.5 MIL/uL — ABNORMAL LOW (ref 4.22–5.81)
RBC: 3.59 MIL/uL — ABNORMAL LOW (ref 4.22–5.81)
RDW: 16.3 % — ABNORMAL HIGH (ref 11.5–15.5)
RDW: 16.1 % — ABNORMAL HIGH (ref 11.5–15.5)
WBC: 21.7 10*3/uL — ABNORMAL HIGH (ref 4.0–10.5)
WBC: 25.9 10*3/uL — ABNORMAL HIGH (ref 4.0–10.5)
nRBC: 1.9 % — ABNORMAL HIGH (ref 0.0–0.2)
nRBC: 1.6 % — ABNORMAL HIGH (ref 0.0–0.2)

## 2022-02-09 LAB — PROCALCITONIN: Procalcitonin: 0.38 ng/mL

## 2022-02-09 LAB — HEMOGLOBIN A1C
Hgb A1c MFr Bld: 6.5 % — ABNORMAL HIGH (ref 4.8–5.6)
Mean Plasma Glucose: 140 mg/dL

## 2022-02-09 LAB — LIPOPROTEIN A (LPA): Lipoprotein (a): 30.8 nmol/L — ABNORMAL HIGH (ref <75.0)

## 2022-02-09 MED ORDER — HEPARIN SOD (PORK) LOCK FLUSH 100 UNIT/ML IV SOLN
500.0000 [IU] | INTRAVENOUS | Status: AC | PRN
  Administered 2022-02-09: 500 [IU]

## 2022-02-09 NOTE — Progress Notes (Addendum)
Rounding Note    Patient Name: Marvin Johnson Date of Encounter: 02/09/2022  [place] Cardiologist: None   Subjective   Feeling well this morning. No chest pain. Spike in WBC overnight, but denies any symptoms. Last chemo treatment was 11/20.  Inpatient Medications    Scheduled Meds:  aspirin EC  81 mg Oral Daily   Chlorhexidine Gluconate Cloth  6 each Topical Daily   clopidogrel  75 mg Oral Daily   enoxaparin (LOVENOX) injection  40 mg Subcutaneous Q24H   ezetimibe  10 mg Oral Daily   metoprolol tartrate  12.5 mg Oral BID   morphine  15 mg Oral Q12H   sodium chloride flush  10-40 mL Intracatheter Q12H   sodium chloride flush  3 mL Intravenous Q12H   sodium chloride flush  3 mL Intravenous Q12H   Continuous Infusions:  sodium chloride     sodium chloride     PRN Meds: sodium chloride, sodium chloride, acetaminophen, nitroGLYCERIN, ondansetron (ZOFRAN) IV, senna, sodium chloride flush, sodium chloride flush, sodium chloride flush   Vital Signs    Vitals:   02/08/22 1806 02/08/22 1934 02/09/22 0504 02/09/22 0756  BP: 124/65 122/66 111/69 123/68  Pulse: 77 75 84 86  Resp:  18  15  Temp:  98.4 F (36.9 C) 98.8 F (37.1 C) 97.6 F (36.4 C)  TempSrc:  Oral Oral Oral  SpO2: 96% 95% 96% 97%  Weight:   84.2 kg   Height:        Intake/Output Summary (Last 24 hours) at 02/09/2022 0959 Last data filed at 02/09/2022 0138 Gross per 24 hour  Intake 792.11 ml  Output --  Net 792.11 ml      02/09/2022    5:04 AM 02/07/2022   11:45 PM 02/03/2022   11:20 AM  Last 3 Weights  Weight (lbs) 185 lb 9.6 oz 194 lb 194 lb  Weight (kg) 84.188 kg 87.998 kg 87.998 kg      Telemetry    Sinus Rhythm-->Sinus Tachycardia - Personally Reviewed  ECG    Sinus Rhythm, 80 bpm baseline artifact - Personally Reviewed  Physical Exam   GEN: No acute distress.   Neck: No JVD Cardiac: RRR, no murmurs, rubs, or gallops.  Respiratory: Clear to auscultation  bilaterally. GI: Soft, nontender, non-distended  MS: No edema; No deformity. Right radial cath site stable.  Neuro:  Nonfocal  Psych: Normal affect   Labs    High Sensitivity Troponin:   Recent Labs  Lab 02/07/22 2358 02/08/22 0213  TROPONINIHS 300* 375*     Chemistry Recent Labs  Lab 02/07/22 2358 02/08/22 0838 02/09/22 0500  NA 139 138 136  K 3.8 3.7 3.6  CL 105 103 106  CO2 '23 23 23  '$ GLUCOSE 182* 102* 101*  BUN '16 13 11  '$ CREATININE 0.88 0.73 0.86  CALCIUM 9.4 8.8* 7.8*  GFRNONAA >60 >60 >60  ANIONGAP '11 12 7    '$ Lipids  Recent Labs  Lab 02/08/22 0838  CHOL 178  TRIG 122  HDL 60  LDLCALC 94  CHOLHDL 3.0    Hematology Recent Labs  Lab 02/07/22 2358 02/08/22 0838 02/09/22 0500  WBC 5.8 9.3 21.7*  RBC 3.79* 3.65* 3.50*  HGB 11.3* 10.9* 10.3*  HCT 35.3* 33.9* 33.1*  MCV 93.1 92.9 94.6  MCH 29.8 29.9 29.4  MCHC 32.0 32.2 31.1  RDW 15.9* 15.9* 16.3*  PLT 270 269 272   Thyroid No results for input(s): "TSH", "FREET4" in the  last 168 hours.  BNP Recent Labs  Lab 02/07/22 2358  BNP 81.6    DDimer No results for input(s): "DDIMER" in the last 168 hours.   Radiology    CARDIAC CATHETERIZATION  Result Date: 02/08/2022 Conclusions: Severe two-vessel coronary artery disease, including severe lesions distal to previously stented OM1 and mid LCx as well as sequential 80-90% and 70% stenoses in RPLA. Patent ostial through mid LAD stent with up to 30% in-stent proximal in-stent restenosis. Patent LCx/OM1 bifurcation stenting with severe disease distal to both stent margins.  There is also 30% in-stent restenosis in the mid LCx just after the takeoff of OM1. Widely patent proximal/mid and distal RCA stents. Normal left ventricular systolic function (LVEF 02-77%) with mildly elevated filling pressure (LVEDP 18 mmHg). Successful PCI to OM1 using Synergy 2.25 x 24 mm drug-eluting stent that overlaps previously placed stent with 0% residual stenosis and TIMI-3 flow.  Successful PCI to distal LCx using Onyx Frontier 2.0 x 15 mm drug-eluting stent that overlaps previously placed stent with 0% residual stenosis and TIMI-3 flow.  Jailed small OM2 branch shows 90% ostial stenosis but TIMI-3 flow. Successful PCI to RPLA/PL2 using overlapping Synergy 2.5 x 24 mm and Onyx Frontier 2.0 x 12 mm drug-eluting stents with 0% residual stenosis and TIMI-3 flow. Recommendations: Continue indefinite dual antiplatelet therapy with aspirin and clopidogrel. Aggressive secondary prevention of CAD. Gentle post catheterization hydration. Nelva Bush, MD [facility]  ECHOCARDIOGRAM COMPLETE  Result Date: 02/08/2022    ECHOCARDIOGRAM REPORT   Patient Name:   Marvin Johnson Date of Exam: 02/08/2022 Medical Rec #:  [id number]           Height:       65.5 in Accession #:    [id number]          Weight:       194.0 lb Date of Birth:  1950-10-20           BSA:          1.964 m Patient Age:    39 years            BP:           113/68 mmHg Patient Gender: M                   HR:           71 bpm. Exam Location:  Inpatient Procedure: 2D Echo Indications:    acute myocardial infarction  History:        Patient has no prior history of Echocardiogram examinations.                 CAD; cancer.  Sonographer:    Johny Chess RDCS Referring Phys: [id number] DENNIS NARCISSE JR  Sonographer Comments: Global longitudinal strain was attempted. IMPRESSIONS  1. Left ventricular ejection fraction, by estimation, is 55 to 60%. The left ventricle has normal function. The left ventricle has no regional wall motion abnormalities. Left ventricular diastolic parameters are consistent with Grade I diastolic dysfunction (impaired relaxation).  2. Right ventricular systolic function is normal. The right ventricular size is normal. Tricuspid regurgitation signal is inadequate for assessing PA pressure.  3. The mitral valve is normal in structure. Trivial mitral valve regurgitation. No evidence of mitral stenosis.  4.  The aortic valve is tricuspid. There is mild calcification of the aortic valve. Aortic valve regurgitation is not visualized. No aortic stenosis is present.  5. The inferior vena cava is normal in  size with greater than 50% respiratory variability, suggesting right atrial pressure of 3 mmHg. FINDINGS  Left Ventricle: Left ventricular ejection fraction, by estimation, is 55 to 60%. The left ventricle has normal function. The left ventricle has no regional wall motion abnormalities. The left ventricular internal cavity size was normal in size. There is  no left ventricular hypertrophy. Left ventricular diastolic parameters are consistent with Grade I diastolic dysfunction (impaired relaxation). Right Ventricle: The right ventricular size is normal. No increase in right ventricular wall thickness. Right ventricular systolic function is normal. Tricuspid regurgitation signal is inadequate for assessing PA pressure. Left Atrium: Left atrial size was normal in size. Right Atrium: Right atrial size was normal in size. Pericardium: There is no evidence of pericardial effusion. Mitral Valve: The mitral valve is normal in structure. Trivial mitral valve regurgitation. No evidence of mitral valve stenosis. Tricuspid Valve: The tricuspid valve is normal in structure. Tricuspid valve regurgitation is not demonstrated. Aortic Valve: The aortic valve is tricuspid. There is mild calcification of the aortic valve. Aortic valve regurgitation is not visualized. No aortic stenosis is present. Pulmonic Valve: The pulmonic valve was normal in structure. Pulmonic valve regurgitation is trivial. Aorta: The aortic root is normal in size and structure. Venous: The inferior vena cava is normal in size with greater than 50% respiratory variability, suggesting right atrial pressure of 3 mmHg. IAS/Shunts: No atrial level shunt detected by color flow Doppler.  LEFT VENTRICLE PLAX 2D LVIDd:         4.30 cm   Diastology LVIDs:         2.50 cm   LV  e' medial:    8.86 cm/s LV PW:         1.10 cm   LV E/e' medial:  8.7 LV IVS:        1.00 cm   LV e' lateral:   9.01 cm/s LVOT diam:     1.80 cm   LV E/e' lateral: 8.6 LV SV:         45 LV SV Index:   23 LVOT Area:     2.54 cm  RIGHT VENTRICLE             IVC RV S prime:     16.30 cm/s  IVC diam: 1.20 cm TAPSE (M-mode): 1.4 cm LEFT ATRIUM             Index        RIGHT ATRIUM           Index LA diam:        3.40 cm 1.73 cm/m   RA Area:     11.60 cm LA Vol (A2C):   27.8 ml 14.16 ml/m  RA Volume:   24.50 ml  12.48 ml/m LA Vol (A4C):   39.7 ml 20.22 ml/m LA Biplane Vol: 36.1 ml 18.38 ml/m  AORTIC VALVE LVOT Vmax:   87.70 cm/s LVOT Vmean:  60.600 cm/s LVOT VTI:    0.178 m  AORTA Ao Root diam: 3.30 cm Ao Asc diam:  3.20 cm MITRAL VALVE MV Area (PHT): 3.31 cm    SHUNTS MV Decel Time: 229 msec    Systemic VTI:  0.18 m MV E velocity: 77.10 cm/s  Systemic Diam: 1.80 cm MV A velocity: 77.80 cm/s MV E/A ratio:  0.99 Dalton McleanMD Electronically signed by Franki Monte Signature Date/Time: 02/08/2022/2:14:32 PM    Final    DG Chest 2 View  Result Date: 02/08/2022 CLINICAL DATA:  Chest pain  EXAM: CHEST - 2 VIEW COMPARISON:  11/20/2021 FINDINGS: Cardiac shadow is mildly enlarged. The lungs are well aerated without focal infiltrate or effusion. Right chest wall port is seen. No bony abnormality is noted. IMPRESSION: No acute abnormality noted Electronically Signed   By: Inez Catalina M.D.   On: 02/08/2022 00:14    Cardiac Studies   Cath: 02/08/22  Conclusions: Severe two-vessel coronary artery disease, including severe lesions distal to previously stented OM1 and mid LCx as well as sequential 80-90% and 70% stenoses in RPLA. Patent ostial through mid LAD stent with up to 30% in-stent proximal in-stent restenosis. Patent LCx/OM1 bifurcation stenting with severe disease distal to both stent margins.  There is also 30% in-stent restenosis in the mid LCx just after the takeoff of OM1. Widely patent  proximal/mid and distal RCA stents. Normal left ventricular systolic function (LVEF 19-50%) with mildly elevated filling pressure (LVEDP 18 mmHg). Successful PCI to OM1 using Synergy 2.25 x 24 mm drug-eluting stent that overlaps previously placed stent with 0% residual stenosis and TIMI-3 flow. Successful PCI to distal LCx using Onyx Frontier 2.0 x 15 mm drug-eluting stent that overlaps previously placed stent with 0% residual stenosis and TIMI-3 flow.  Jailed small OM2 branch shows 90% ostial stenosis but TIMI-3 flow. Successful PCI to RPLA/PL2 using overlapping Synergy 2.5 x 24 mm and Onyx Frontier 2.0 x 12 mm drug-eluting stents with 0% residual stenosis and TIMI-3 flow.   Recommendations: Continue indefinite dual antiplatelet therapy with aspirin and clopidogrel. Aggressive secondary prevention of CAD. Gentle post catheterization hydration.   Nelva Bush, MD [facility]  Diagnostic Dominance: Right  Intervention    Patient Profile     71 y.o. male with CAD s/p RCA PCI (50% Lcx, OM! 30% treated medical on last cath 2020), htn, hld, prostate cancer who was seen 02/08/2022 for the evaluation of chest pain/NSTEMI.    Assessment & Plan    NSTEMI -- hsTn 300>>375, has been having unstable angina over the past 2-3 weeks. EKG shows concern for 1 mm STE in inferior leads, with ST depression in lateral leads. Underwent cardiac cath noted above with PCI/DES x1 to OM1, DESx1 to dLCx (jailed small OM2), PCI/overlapping stents to RPLA/PL2. Plan for DAPT with ASA/plavix indefinitely. No recurrent chest pain. Echo showed LVEF of 55-60%, no rWMA, g1DD -- continue ASA, plavix, metoprolol, Zetia   HTN -- stable -- continue metoprolol 12.'5mg'$  BID   HLD -- statin intolerant -- continue Zetia, has been on praluent but not taken in several weeks   Prostate CA  -- followed in , initially dx 2015 -- s/p 4 weeks of chemo, last treatment was 11/20  Leukocytosis -- WBC spike from  9>>21 this morning. Afebrile, no infectious symptoms. Given his immunocompromised state need to rule out underlying infection -- check pro-calcitonin, recheck CBC, UA/culture -- will consult IM for further recommendations   For questions or updates, please contact [place] Please consult [email or web address] for contact info under        Signed, Reino Bellis, NP  02/09/2022, 9:59 AM     Agree with note by Reino Bellis NP-C  Mr. Cedrik was admitted with a non-STEMI.  He has had prior intervention.  He underwent multivessel intervention by Dr. Saunders Revel yesterday of an obtuse marginal branch, distal AV groove circumflex and PLA branch successfully.  He is asymptomatic today.  His white count is significantly for unclear reasons.  He is afebrile otherwise asymptomatic.  He is on dual antiplatelet therapy.  I want to get internal medicine's input about etiology and potential treatment prior to discharge.  We will consult them today and defer discharge until further recommendations are obtained.  Lorretta Harp, M.D., [place], [facility], [place] [address] [place], [place]  [address]  [phone] 02/09/2022 10:56 AM

## 2022-02-09 NOTE — Care Management (Addendum)
  Transition of Care [facility]) Screening Note   Patient Details  Name: Marvin Johnson Date of Birth: 18-Oct-1950   Transition of Care [facility]) CM/SW Contact:    Bethena Roys, RN Phone Number: 02/09/2022, 12:34 PM    Transition of Care Department [facility]) has reviewed the patient and no TOC needs have been identified at this time. Case Manager called the [place] Coordinator to make them aware of hospitalization. Case Manager awaiting call back from the [facility]. Case Manager will continue to monitor patient advancement through interdisciplinary progression rounds. If new patient transition needs arise, please place a TOC consult.   1330 11-28 Patient gets his care at the [facility]- PCP is Earline Mayotte and CSW is Eddie North [phone] ext [phone] for any discharge planning needs.

## 2022-02-09 NOTE — Discharge Summary (Addendum)
Discharge Summary    Patient ID: Marvin Johnson MRN: [id number]; DOB: July 02, 1950  Admit date: 02/07/2022 Discharge date: 02/09/2022  PCP:  Earline Mayotte, Lillie Providers Cardiologist:  Quay Burow, MD   {Also follows with cardiology through the [facility]   Discharge Diagnoses    Principal Problem:   NSTEMI (non-ST elevated myocardial infarction) [facility]) Active Problems:   Hyperlipidemia   Hypertension  Diagnostic Studies/Procedures    Cath: 02/08/22   Conclusions: Severe two-vessel coronary artery disease, including severe lesions distal to previously stented OM1 and mid LCx as well as sequential 80-90% and 70% stenoses in RPLA. Patent ostial through mid LAD stent with up to 30% in-stent proximal in-stent restenosis. Patent LCx/OM1 bifurcation stenting with severe disease distal to both stent margins.  There is also 30% in-stent restenosis in the mid LCx just after the takeoff of OM1. Widely patent proximal/mid and distal RCA stents. Normal left ventricular systolic function (LVEF 81-82%) with mildly elevated filling pressure (LVEDP 18 mmHg). Successful PCI to OM1 using Synergy 2.25 x 24 mm drug-eluting stent that overlaps previously placed stent with 0% residual stenosis and TIMI-3 flow. Successful PCI to distal LCx using Onyx Frontier 2.0 x 15 mm drug-eluting stent that overlaps previously placed stent with 0% residual stenosis and TIMI-3 flow.  Jailed small OM2 branch shows 90% ostial stenosis but TIMI-3 flow. Successful PCI to RPLA/PL2 using overlapping Synergy 2.5 x 24 mm and Onyx Frontier 2.0 x 12 mm drug-eluting stents with 0% residual stenosis and TIMI-3 flow.   Recommendations: Continue indefinite dual antiplatelet therapy with aspirin and clopidogrel. Aggressive secondary prevention of CAD. Gentle post catheterization hydration.   Nelva Bush, MD [facility]   Diagnostic Dominance: Right  Intervention    _____________    History of Present Illness     Marvin Johnson is a 71 y.o. male with CAD (prior PCIs), HTN, HLD, prostate cancer who presents to the emergency department with chest pain. Noted that he had been having chest pain and shortness of breath over the last week prior to admission that had been progressive. The day prior to admission was his worse day of symptoms. He took 3 SL NTG without relief and thus presented to the ED. Denies any other symptoms.    On arrival, he was hemodynamically stable.  Initial troponin 300.  ECG with Q waves inferiorly and global ST depression with elevation in aVR.  Started on heparin in the emergency department.   He is under active treatment for prostate cancer, followed in [place].  No infectious symptoms.  Hemoglobin has been stable.  No known metastasis to his brain.  Hospital Course     Consultants: Curbside to oncology  NSTEMI -- hsTn 300>>375, had been having unstable angina over the past 2-3 weeks. EKG shows concern for 1 mm STE in inferior leads, with ST depression in lateral leads. Underwent cardiac cath noted above with PCI/DES x1 to OM1, DESx1 to dLCx (jailed small OM2), PCI/overlapping stents to RPLA/PL2. Plan for DAPT with ASA/plavix indefinitely. No recurrent chest pain. Echo showed LVEF of 55-60%, no rWMA, g1DD. Seen by cardiac rehab, no recurrent chest pain. -- continue ASA, plavix, metoprolol, Zetia   HTN -- stable -- continue metoprolol 12.74m BID   HLD -- statin intolerant -- continue Zetia, has been on praluent but not taken in several weeks   Prostate CA  -- followed in [place], initially dx 2015 -- s/p 4 weeks of chemo, last treatment was 11/20  Leukocytosis -- WBC spike from 9>>21 this morning. Afebrile, no infectious symptoms. Given his immunocompromised state need to rule out underlying infection. Pro-calcitonin negative. UA WNL. Spoke with IM with rec's to reach out to oncology. Spoke with his Oncologist, informed he is  receiving Neupogen which is likely the cause of the elevated WBC.  -- rechecked later in the afternoon, remained afebrile. Felt well. Instructed to keep scheduled follow up with oncology and monitor for any change/development of symptoms  Did the patient have an acute coronary syndrome (MI, NSTEMI, STEMI, etc) this admission?:  Yes                               AHA/ACC Clinical Performance & Quality Measures: Aspirin prescribed? - Yes ADP Receptor Inhibitor (Plavix/Clopidogrel, Brilinta/Ticagrelor or Effient/Prasugrel) prescribed (includes medically managed patients)? - Yes Beta Blocker prescribed? - Yes High Intensity Statin (Lipitor 40-53m or Crestor 20-460m prescribed? - No - statin intolerant, on praluent EF assessed during THIS hospitalization? - Yes For EF <40%, was ACEI/ARB prescribed? - Not Applicable (EF >/= 4093%For EF <40%, Aldosterone Antagonist (Spironolactone or Eplerenone) prescribed? - Not Applicable (EF >/= 4071%Cardiac Rehab Phase II ordered (including medically managed patients)? - Yes   Patient was seen by Dr. BeGwenlyn Foundnd deemed stable for discharge home. Follow up arranged in the [place] office as [facility]     The patient will be scheduled for a TOC follow up appointment in 10-14 days.  [facility] at the office where the patient should be seen for follow up.  _____________  Discharge Vitals Blood pressure 119/71, pulse 77, temperature 97.6 F (36.4 C), temperature source Oral, resp. rate 15, height 5' 5.5" (1.664 m), weight 84.2 kg, SpO2 95 %.  Filed Weights   02/07/22 2345 02/09/22 0504  Weight: 88 kg 84.2 kg    Labs & Radiologic Studies    CBC Recent Labs    02/07/22 2358 02/08/22 0838 02/09/22 0500 02/09/22 1048  WBC 5.8   < > 21.7* 25.9*  NEUTROABS 2.7  --   --   --   HGB 11.3*   < > 10.3* 10.6*  HCT 35.3*   < > 33.1* 33.6*  MCV 93.1    < > 94.6 93.6  PLT 270   < > 272 286   < > = values in this interval not displayed.   Basic Metabolic Panel Recent Labs    02/08/22 0838 02/09/22 0500  NA 138 136  K 3.7 3.6  CL 103 106  CO2 23 23  GLUCOSE 102* 101*  BUN 13 11  CREATININE 0.73 0.86  CALCIUM 8.8* 7.8*   Liver Function Tests No results for input(s): "AST", "ALT", "ALKPHOS", "BILITOT", "PROT", "ALBUMIN" in the last 72 hours. No results for input(s): "LIPASE", "AMYLASE" in the last 72 hours. High Sensitivity Troponin:   Recent Labs  Lab 02/07/22 2358 02/08/22 0213  TROPONINIHS 300* 375*    BNP Invalid input(s): "POCBNP" D-Dimer No results for input(s): "DDIMER" in the last 72 hours. Hemoglobin A1C Recent Labs    02/08/22 0838  HGBA1C 6.5*   Fasting Lipid Panel Recent Labs    02/08/22 0838  CHOL 178  HDL 60  LDLCALC 94  TRIG 122  CHOLHDL 3.0   Thyroid Function Tests No results for input(s): "TSH", "T4TOTAL", "  T3FREE", "THYROIDAB" in the last 72 hours.  Invalid input(s): "FREET3" _____________  CARDIAC CATHETERIZATION  Result Date: 02/08/2022 Conclusions: Severe two-vessel coronary artery disease, including severe lesions distal to previously stented OM1 and mid LCx as well as sequential 80-90% and 70% stenoses in RPLA. Patent ostial through mid LAD stent with up to 30% in-stent proximal in-stent restenosis. Patent LCx/OM1 bifurcation stenting with severe disease distal to both stent margins.  There is also 30% in-stent restenosis in the mid LCx just after the takeoff of OM1. Widely patent proximal/mid and distal RCA stents. Normal left ventricular systolic function (LVEF 42-87%) with mildly elevated filling pressure (LVEDP 18 mmHg). Successful PCI to OM1 using Synergy 2.25 x 24 mm drug-eluting stent that overlaps previously placed stent with 0% residual stenosis and TIMI-3 flow. Successful PCI to distal LCx using Onyx Frontier 2.0 x 15 mm drug-eluting stent that overlaps previously placed stent with  0% residual stenosis and TIMI-3 flow.  Jailed small OM2 branch shows 90% ostial stenosis but TIMI-3 flow. Successful PCI to RPLA/PL2 using overlapping Synergy 2.5 x 24 mm and Onyx Frontier 2.0 x 12 mm drug-eluting stents with 0% residual stenosis and TIMI-3 flow. Recommendations: Continue indefinite dual antiplatelet therapy with aspirin and clopidogrel. Aggressive secondary prevention of CAD. Gentle post catheterization hydration. Nelva Bush, MD [facility]  ECHOCARDIOGRAM COMPLETE  Result Date: 02/08/2022    ECHOCARDIOGRAM REPORT   Patient Name:   Marvin Johnson Date of Exam: 02/08/2022 Medical Rec #:  [id number]           Height:       65.5 in Accession #:    [id number]          Weight:       194.0 lb Date of Birth:  August 20, 1950           BSA:          1.964 m Patient Age:    67 years            BP:           113/68 mmHg Patient Gender: M                   HR:           71 bpm. Exam Location:  Inpatient Procedure: 2D Echo Indications:    acute myocardial infarction  History:        Patient has no prior history of Echocardiogram examinations.                 CAD; cancer.  Sonographer:    Johny Chess RDCS Referring Phys: [id number] DENNIS NARCISSE JR  Sonographer Comments: Global longitudinal strain was attempted. IMPRESSIONS  1. Left ventricular ejection fraction, by estimation, is 55 to 60%. The left ventricle has normal function. The left ventricle has no regional wall motion abnormalities. Left ventricular diastolic parameters are consistent with Grade I diastolic dysfunction (impaired relaxation).  2. Right ventricular systolic function is normal. The right ventricular size is normal. Tricuspid regurgitation signal is inadequate for assessing PA pressure.  3. The mitral valve is normal in structure. Trivial mitral valve regurgitation. No evidence of mitral stenosis.  4. The aortic valve is tricuspid. There is mild calcification of the aortic valve. Aortic valve regurgitation is not  visualized. No aortic stenosis is present.  5. The inferior vena cava is normal in size with greater than 50% respiratory variability, suggesting right atrial pressure of 3 mmHg. FINDINGS  Left Ventricle: Left ventricular ejection  fraction, by estimation, is 55 to 60%. The left ventricle has normal function. The left ventricle has no regional wall motion abnormalities. The left ventricular internal cavity size was normal in size. There is  no left ventricular hypertrophy. Left ventricular diastolic parameters are consistent with Grade I diastolic dysfunction (impaired relaxation). Right Ventricle: The right ventricular size is normal. No increase in right ventricular wall thickness. Right ventricular systolic function is normal. Tricuspid regurgitation signal is inadequate for assessing PA pressure. Left Atrium: Left atrial size was normal in size. Right Atrium: Right atrial size was normal in size. Pericardium: There is no evidence of pericardial effusion. Mitral Valve: The mitral valve is normal in structure. Trivial mitral valve regurgitation. No evidence of mitral valve stenosis. Tricuspid Valve: The tricuspid valve is normal in structure. Tricuspid valve regurgitation is not demonstrated. Aortic Valve: The aortic valve is tricuspid. There is mild calcification of the aortic valve. Aortic valve regurgitation is not visualized. No aortic stenosis is present. Pulmonic Valve: The pulmonic valve was normal in structure. Pulmonic valve regurgitation is trivial. Aorta: The aortic root is normal in size and structure. Venous: The inferior vena cava is normal in size with greater than 50% respiratory variability, suggesting right atrial pressure of 3 mmHg. IAS/Shunts: No atrial level shunt detected by color flow Doppler.  LEFT VENTRICLE PLAX 2D LVIDd:         4.30 cm   Diastology LVIDs:         2.50 cm   LV e' medial:    8.86 cm/s LV PW:         1.10 cm   LV E/e' medial:  8.7 LV IVS:        1.00 cm   LV e' lateral:    9.01 cm/s LVOT diam:     1.80 cm   LV E/e' lateral: 8.6 LV SV:         45 LV SV Index:   23 LVOT Area:     2.54 cm  RIGHT VENTRICLE             IVC RV S prime:     16.30 cm/s  IVC diam: 1.20 cm TAPSE (M-mode): 1.4 cm LEFT ATRIUM             Index        RIGHT ATRIUM           Index LA diam:        3.40 cm 1.73 cm/m   RA Area:     11.60 cm LA Vol (A2C):   27.8 ml 14.16 ml/m  RA Volume:   24.50 ml  12.48 ml/m LA Vol (A4C):   39.7 ml 20.22 ml/m LA Biplane Vol: 36.1 ml 18.38 ml/m  AORTIC VALVE LVOT Vmax:   87.70 cm/s LVOT Vmean:  60.600 cm/s LVOT VTI:    0.178 m  AORTA Ao Root diam: 3.30 cm Ao Asc diam:  3.20 cm MITRAL VALVE MV Area (PHT): 3.31 cm    SHUNTS MV Decel Time: 229 msec    Systemic VTI:  0.18 m MV E velocity: 77.10 cm/s  Systemic Diam: 1.80 cm MV A velocity: 77.80 cm/s MV E/A ratio:  0.99 Marvin McleanMD Electronically signed by Franki Monte Signature Date/Time: 02/08/2022/2:14:32 PM    Final    DG Chest 2 View  Result Date: 02/08/2022 CLINICAL DATA:  Chest pain EXAM: CHEST - 2 VIEW COMPARISON:  11/20/2021 FINDINGS: Cardiac shadow is mildly enlarged. The lungs are well aerated without focal  infiltrate or effusion. Right chest wall port is seen. No bony abnormality is noted. IMPRESSION: No acute abnormality noted Electronically Signed   By: Inez Catalina M.D.   On: 02/08/2022 00:14   Disposition   Pt is being discharged home today in good condition.  Follow-up Plans & Appointments     Follow-up Information     Almyra Deforest, [place] Follow up on 02/24/2022.   Specialties: Cardiology, Radiology Why: at 1:55pm for your follow up appt with Dr. Rachel Bo' [place] information: [address] [place] [address] [phone]                Discharge Instructions     Amb Referral to Cardiac Rehabilitation   Complete by: As directed    [facility]   Diagnosis: Coronary Stents   After initial evaluation and assessments completed: Virtual Based Care may be  provided alone or in conjunction with Phase 2 Cardiac Rehab based on patient barriers.: Yes   Intensive Cardiac Rehabilitation (ICR) [place] location only OR Traditional Cardiac Rehabilitation (TCR) *If criteria for ICR are not met will enroll in TCR [facility] only): Yes   Call MD for:  difficulty breathing, headache or visual disturbances   Complete by: As directed    Call MD for:  persistant dizziness or light-headedness   Complete by: As directed    Call MD for:  redness, tenderness, or signs of infection (pain, swelling, redness, odor or green/yellow discharge around incision site)   Complete by: As directed    Diet - low sodium heart healthy   Complete by: As directed    Discharge instructions   Complete by: As directed    Radial Site Care Refer to this sheet in the next few weeks. These instructions provide you with information on caring for yourself after your procedure. Your caregiver may also give you more specific instructions. Your treatment has been planned according to current medical practices, but problems sometimes occur. Call your caregiver if you have any problems or questions after your procedure. HOME CARE INSTRUCTIONS You may shower the day after the procedure. Remove the bandage (dressing) and gently wash the site with plain soap and water. Gently pat the site dry.  Do not apply powder or lotion to the site.  Do not submerge the affected site in water for 3 to 5 days.  Inspect the site at least twice daily.  Do not flex or bend the affected arm for 24 hours.  No lifting over 5 pounds (2.3 kg) for 5 days after your procedure.  Do not drive home if you are discharged the same day of the procedure. Have someone else drive you.  You may drive 24 hours after the procedure unless otherwise instructed by your caregiver.  What to expect: Any bruising will usually fade within 1 to 2 weeks.  Blood that collects in the tissue (hematoma) may be painful to the touch. It should usually  decrease in size and tenderness within 1 to 2 weeks.  SEEK IMMEDIATE MEDICAL CARE IF: You have unusual pain at the radial site.  You have redness, warmth, swelling, or pain at the radial site.  You have drainage (other than a small amount of blood on the dressing).  You have chills.  You have a fever or persistent symptoms for more than 72 hours.  You have a fever and your symptoms suddenly get worse.  Your arm becomes pale, cool, tingly, or numb.  You have heavy bleeding from the  site. Hold pressure on the site.   PLEASE DO NOT MISS ANY DOSES OF YOUR PLAVIX!!!!! Also keep a log of you blood pressures and bring back to your follow up appt. Please call the office with any questions.   Patients taking blood thinners should generally stay away from medicines like ibuprofen, Advil, Motrin, naproxen, and Aleve due to risk of stomach bleeding. You may take Tylenol as directed or talk to your primary doctor about alternatives.   PLEASE ENSURE THAT YOU DO NOT RUN OUT OF YOUR PLAVIX. This medication is very important to remain on for at least one year. IF you have issues obtaining this medication due to cost please CALL the office 3-5 business days prior to running out in order to prevent missing doses of this medication.   Increase activity slowly   Complete by: As directed         Discharge Medications   Allergies as of 02/09/2022       Reactions   Atorvastatin    Rosuvastatin Other (See Comments)   Other reaction(s): Muscle pain   Simvastatin Other (See Comments)   Other reaction(s): Joint pain, Muscle pain   Sulfa Antibiotics    Sulfamethoxazole-trimethoprim Other (See Comments)   Unknown as to interaction was a baby.        Medication List     STOP taking these medications    prochlorperazine 10 MG tablet Commonly known as: COMPAZINE       TAKE these medications    aspirin EC 81 MG tablet Take 81 mg by mouth daily.   Cholecalciferol 25 MCG (1000 UT) tablet Take  1,000 Units by mouth in the morning and at bedtime.   clopidogrel 75 MG tablet Commonly known as: PLAVIX Take 75 mg by mouth daily.   dexamethasone 4 MG tablet Commonly known as: DECADRON Take 8 mg by mouth See admin instructions. Take two tablets by mouth twice a day (start the day before chemo, then take daily for 2 days starting te day after chemo; take with food)   diphenhydrAMINE 25 MG tablet Commonly known as: SOMINEX Take 50 mg by mouth at bedtime.   ezetimibe 10 MG tablet Commonly known as: ZETIA Take 10 mg by mouth daily.   Fish Oil 1000 MG Caps Take 2,000 mg by mouth in the morning and at bedtime.   isosorbide mononitrate 30 MG 24 hr tablet Commonly known as: IMDUR Take 1 tablet by mouth every 8 (eight) hours as needed.   [place] Ginseng Complex 150-250-50 MG Caps Take 1 capsule by mouth daily.   leuprolide 22.5 MG injection Commonly known as: LUPRON Inject 22.5 mg into the muscle every 3 (three) months.   metoprolol tartrate 25 MG tablet Commonly known as: LOPRESSOR Take 12.5 mg by mouth 2 (two) times daily.   morphine 15 MG 12 hr tablet Commonly known as: MS CONTIN Take 1 tablet (15 mg total) by mouth every 12 (twelve) hours.   multivitamin with minerals tablet Take 1 tablet by mouth daily.   nitroGLYCERIN 0.4 MG SL tablet Commonly known as: NITROSTAT Place 0.4 mg under the tongue every 5 (five) minutes as needed for chest pain. Repeat every 5 minutes if needed for a total of 3 tablets in 15 minutes. If no relief, CALL 911.   ondansetron 8 MG tablet Commonly known as: Zofran Take 1 tablet (8 mg total) by mouth every 8 (eight) hours as needed for nausea or vomiting.   PRALUENT [place] Inject into the skin. One  injection every 2 weeks Monday   predniSONE 5 MG tablet Commonly known as: DELTASONE Take 1 tablet (5 mg total) by mouth in the morning and at bedtime.   senna 8.6 MG Tabs tablet Commonly known as: SENOKOT Take 4 tablets by mouth 2 (two) times  daily as needed for moderate constipation.   Zoledronic Acid 4 MG/100ML IVPB Commonly known as: ZOMETA Inject 4 mg into the vein every 3 (three) months.        Outstanding Labs/Studies   N/a   Duration of Discharge Encounter   Greater than 30 minutes including physician time.  Signed, Reino Bellis, NP 02/09/2022, 5:03 PM  Agree with note by Reino Bellis NP-C  Mr. Karene Fry was admitted with a non-STEMI.  He had prior intervention in the past of his LAD and circumflex.  He underwent multivessel intervention by Dr. Saunders Revel yesterday of his circumflex obtuse marginal branch, distal circumflex and PLA branches successfully.  He was asymptomatic this morning.  He is on dual antiplatelet therapy.  His white count had increased to 21,000 but he is a oncology patient and we reached out to his oncologist who felt this was consistent with his recent chemotherapy I was going to follow him as an outpatient.  He stable for discharge from a cardiology point of view.  We will arrange outpatient follow-up in our [facility] line clinic.  Lorretta Harp, M.D., [place], [facility], Laverta [place] Hodge [address] [place], [place]  [address]  [phone] 02/09/2022 5:09 PM

## 2022-02-09 NOTE — Progress Notes (Signed)
CARDIAC REHAB PHASE I   PRE:  Rate/Rhythm: 93 SR with PVCs  BP:  Sitting: 123/62      SaO2: 96 RA  MODE:  Ambulation: 470 ft   POST:  Rate/Rhythm: 134 ST  BP:  Sitting: 145/77      SaO2: 96 RA  Pt ambulated very well, denies CP and SOB, RPE 11. Pt educated on NSTEMI, stent procedure, asa and plavix, wt restrictions, no baths/daily wash-ups, s/s of infection, ex guidelines (progressive walking and light resistance exercise), s/s tro stop exercising, and ntg use and calling 911, heart healthy and diabetic diet, risk factors(high A1C), and CRPII. Pt will be referred to [facility].   [id number]  Marvin Johnson  9:49 AM 02/09/2022

## 2022-02-10 LAB — URINE CULTURE: Culture: NO GROWTH

## 2022-02-11 ENCOUNTER — Ambulatory Visit: Payer: Non-veteran care

## 2022-02-15 ENCOUNTER — Encounter: Payer: Self-pay | Admitting: Oncology

## 2022-02-18 ENCOUNTER — Encounter: Payer: Self-pay | Admitting: Oncology

## 2022-02-18 NOTE — Progress Notes (Signed)
[place]  [address] 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 He has been on enzalutamide 160 mg daily, as well as leuprolide 22.5 mg, and zoledronic acid every 3 months.  His pain is controlled with MS Contin 15 mg twice daily with occasional Tylenol for breakthrough.   He has been on enzalutamide since June 2018.  He then had a teadily rising PSA, up to 83.49 in November 2022.  PSMA PET in October 2022 revealed an oligometastasis skeletal lesion it in the anterior right 7th rib, with increased activity, and very subtle sclerotic change at T10, but no evidence of progressive disease.The PSA has continued to fluctuate up and down.  It was down to 31 in February of 2023, then back up to 43 in May.  It then increased dramatically to 334 in August and he was scheduled for a PSMA PET scan.  This revealed severe progression of disease with several tiny lung nodules which did have hypermetabolic activity with SUV up to 5.  He had increased bone lesions which are now innumerable, including major lesions at L3, the right iliac wing, and the left pubic bone. These were not visible on CT scan. He started Taxotere chemotherapy in September 2023 and is tolerating well. His PSA continued to increase by approximately 100 points for September and October but the last one only went up by 5 points so hopefully he is stabilizing. Last time his PSA went down from 536 to 400.75 after 3 cycles.  Secondary malignant neoplasm of bone and bone marrow ([place]) He continues zoledronic acid every 3 months in addition to his cancer therapy.  He now has marked progression of his bone metastases but minimal symptoms.  Anemia. This is a change and likely related to the  chemotherapy. This has gone down to 10.6 in November but is up to 11.0 today.  Leukocytosis This was due to the Neulasta injection and his white count was up to 40,000 last week, then decreased to 16,000.  It fluctuates up and down but is normal today.  Non-STEMI He was admitted to the hospital on an emergency basis and had 4 stents placed about 2 weeks ago (November 2023).  He is feeling much better and denies any pain     Plan:  He had a dramatic change in his bone metastases and now there are several tiny but suspicious lung lesions.  His PSA had jumped from 78 to 334, which prompted the new scan.  We stopped his enzalutamide and started Taxotere chemotherapy every 3 weeks in September, and will have his 4th cycle this week.  This was delayed due to his non-STEMI but he seems well recovered.  We will proceed with his Lupron and Zometa this week by 9/13, his PSA was up to 438 and has continued to increase to 536. Today's PSA is pending. He has worsening anemia but says his fatigue is no worse than usual. He will return in 3 weeks with CBC, CMP and PSA and have his 5th cycle. The patient understands the plans discussed today and is in agreement with them.  He knows to contact our office if he develops concerns prior to his next appointment.   His questions were answered.   I provided 25 minutes of face-to-face time  during this encounter and > 50% was spent counseling as documented under my assessment and plan.   ADDENDUM: His PSA is stable at 403.39, as compared to 400.75 in November.   [name], MD  [facility] AT [facility] [address] [place] [address] Dept: [phone] Dept Fax: [phone]    CHIEF COMPLAINT:  CC: Metastatic prostate cancer to bone, progressive  Current Treatment: Leuprolide/zoledronic acid and docetaxel  HISTORY OF PRESENT ILLNESS:  Daymeon Fischman is a 71 year old male with a history of  stage IV (T3b N1 M0) prostate cancer diagnosed in January 2016 when he was found to have a PSA of 51.8.  He was treated with a laparoscopic prostatectomy in May 2016.  Pathology revealed adenocarcinoma with a Gleason 8.  There was a positive posterior margin with extraprostatic extension, as well as left retro-urethral tissue positive and extension to the seminal vesicles bilaterally with multiple positive margins.  Two lymph nodes from the right side were negative, but 2/4 lymph nodes from the left side were positive for metastasis. Repeat bone scan in June 2016 revealed a new lesion at T10, which was not present on his baseline bone scan.  PSA at Dr. [name] office in early August 2016 remained elevated at 56.4.   We began seeing him in August 2016. The PSA was up to 216.3 in late August 2016, so hormonal therapy was recommended  X-rays of the thoracic spine revealed osteoarthritis and scoliosis, as well as evidence of demineralization, but we could not visualize a metastatic lesion.[name] He started leuprolide in September 2016.  The PSA initially dropped steadily with leuprolide.  Bone density scan in November 2016 revealed osteopenia, with a T-score of -2.1 in the spine and a T-score of -0.9 in the femur.  He had been taking vitamin-D 1000 international units daily, but not calcium, so calcium 600 mg twice daily was added at that time.    Leuprolide was held in February 2017 because of chest pain.  EKG was unremarkable.  He went to the [place] and he was referred to the [facility] for placement of 2 coronary artery stents for 95% and 99% occlusions.  He then had a third coronary artery stent placed later in the summer.  Leuprolide was resumed in March 2017.  In September 2017, he had an increase in the PSA, so we repeated a bone scan.  This revealed intense uptake within the right posterior elements of T10, but no other areas of metastasis.  We then added bicalutamide 50 mg daily to his leuprolide.     He had a  steadily increasing PSA despite the bicalutamide, so this was discontinued in March 2018.  He eventually was placed on MS Contin 15 mg twice daily, with oxycodone 10 mg every 4 hours as needed for breakthrough pain.  MRI thoracic spine in April 2018 revealed increasing tumor at T10 with epidural spread.  Bone scan at that time revealed increased activity in the T10 lesion, which was stable.  There was a questionable tiny focus in the right ischium/acetabular rim.  Plain x-rays of the bilateral hips and pelvis did not reveal any focal abnormality. The PSA increased from 27.5 in March, to 72.2 in May, then to 92.2 in June 2018.  He was referred for radiotherapy to the T10 lesion due to the severe pain with improvement in his pain. He was started on zoledronic acid along with his leuprolide in June 2018.  He had  a Port-A-Cath placed in anticipation of chemotherapy, but then we recommended that he be placed on enzalutamide 160 mg daily instead, in order to delay the time to initiation of chemotherapy.  He started enzalutamide in June 2018.  The PSA became undetectable in October 2018.  We continued MS Contin, but discontinued the oxycodone. He did not tolerate meloxicam, so uses Tylenol as needed for breakthrough pain.     He had a stress test in January 2020 and was referred to [facility] for coronary artery stenting, which was done in February.  Zoledronic acid had been given monthly for over 2 years, so was changed to every 3 months in October 2020.   The PSA started to increase in January 2021 at which time it was 0.2.  CT chest, abdomen and pelvis in March 2021 not reveal any lymphadenopathy or soft tissue metastatic disease.  Hepatic steatosis was seen.  There was a stable sclerotic osseous lesion of the T10 vertebral body, as well as a subtle sclerotic lesion of the L3 vertebral body, also possibly a small metastatic lesion.  Whole body bone scan did not reveal continued increased activity of  T10 and right acetabulum posteriorly.  The PSA was 0.5 in March.  PSA had increased to 3.0 in June.  Axumin PET imaging from July did not reveal any evidence of active prostate carcinoma, local recurrence, or metastatic disease. The PSA continued to slowly rise and was up to 5.9 in September, 10.9 in October, and 16.3 in December.     The PSA went up to 23.81 in March 2022, so an [place] PET scan was ordered which revealed an oligometastasis skeletal lesion in the anterior right 7th rib with very subtle sclerotic change at this level. PSA in June decreased to 18.6, but then in September increased to 58.  In view of the significant rise in his PSA, a prostate specific PET scan was ordered.  PSMA PET in October 2022 revealed a persistent focus of intense radiotracer activity in the anterior RIGHT seventh rib with increased in activity from comparison exam and subtle sclerotic changes on CT. Findings remain consistent with oligometastatic skeletal metastasis. Sclerotic lesions in the T10 vertebral body without radiotracer activity potentially represented prior treated prostate cancer metastasis.  We recommended he continue his current treatment with enzalutamide, leuprolide and zoledronic acid.  PSA in November continued to increase to 83.49.  Even though his PSA continued to steadily increase, we did not recommend a change in therapy based on this alone.  He had no severe pain and no increase in pain and was having a good quality of life.   Oncology History  Malignant neoplasm of prostate ([place])  03/30/2014 Cancer Staging   Staging form: Prostate, AJCC 8th Edition - Clinical stage from 03/30/2014: Stage IVA (cT3b, cN1, cM0, PSA: 51.8, Grade Group: 4) - Signed by [name], MD on 11/14/2020 Histopathologic type: Adenocarcinoma, NOS Stage prefix: Initial diagnosis Prostate specific antigen (PSA) range: 20 or greater Gleason primary pattern: 4 Gleason secondary pattern: 4 Gleason score: 8 Histologic  grading system: 5 grade system Location of positive needle core biopsies: Beyond primary site Prognostic indicators: May 2016 Robotic prostatectomy with mult. Pos margins, extra prostatic extension, 2/6 pos nodes. Scan June 2016 positive at T10  PSA up to 216.3 by August Placed on lupron and casodex Stage used in treatment planning: Yes National guidelines used in treatment planning: Yes Type of national guideline used in treatment planning: NCCN   02/18/2020 Initial  Diagnosis   Malignant neoplasm of prostate ([place])   12/01/2021 -  Chemotherapy   Patient is on Treatment Plan : PROSTATE Docetaxel (73) + Prednisone q21d     Secondary malignant neoplasm of bone and bone marrow ([place])  02/18/2020 Initial Diagnosis   Secondary malignant neoplasm of bone and bone marrow ([place])   12/01/2021 -  Chemotherapy   Patient is on Treatment Plan : PROSTATE Docetaxel (73) + Prednisone q21d         INTERVAL HISTORY:  Lavaris is here today for repeat clinical assessment of his prostate cancer metastatic to bone.  He had his Lupron last week.  He was on enzalutamide for 5 years but this was stopped when his disease progressed in August.  His PSMA PET scan revealed severe progression of disease with several tiny lung nodules which did have hypermetabolic activity with SUV up to 5.  He had increased bone lesions which are now innumerable, including major lesions at L3, the right iliac wing, and the left pubic bone. These were not visible on CT scan. We stopped the enzalutamide and started him on Taxotere chemotherapy in September of 2023. He has now completed 3 cycles of Taxotere and treatment was interrupted due to a non-STEMI myocardial infarction.  He had several stents placed in November. His PSA continued to rise, on 12/18/21 was 531.36 and on 01/08/22 it was 536.11. His HGB was 12.9 and had decreased again decreased to 11.4 and then 10.6 in November. He reports feeling fatigued often since starting chemotherapy.He  denies any neuropathy. He still remains on MS Contin '15mg'$  every 12 hours and only requires Tylenol for breakthrough pain. He denies signs of infection such as sore throat, sinus drainage, cough, or urinary symptoms.  He denies fevers or recurrent chills. He denies nausea, vomiting, chest pain, dyspnea or cough. His weight has been stable.   REVIEW OF SYSTEMS:  Review of Systems  Constitutional:  Positive for fatigue. Negative for appetite change, chills, fever and unexpected weight change.  HENT:  Negative.  Negative for lump/mass, mouth sores and sore throat.   Eyes: Negative.   Respiratory: Negative.  Negative for chest tightness, cough, hemoptysis, shortness of breath and wheezing.   Cardiovascular: Negative.  Negative for chest pain, leg swelling and palpitations.  Gastrointestinal: Negative.  Negative for abdominal distention, abdominal pain, blood in stool, constipation, diarrhea, nausea and vomiting.  Endocrine: Negative.  Negative for hot flashes.  Genitourinary: Negative.  Negative for difficulty urinating, dysuria, frequency and hematuria.   Musculoskeletal:  Positive for arthralgias and myalgias. Negative for flank pain and gait problem.  Skin: Negative.   Neurological: Negative.  Negative for dizziness, extremity weakness, gait problem, headaches, light-headedness, numbness, seizures and speech difficulty.  Hematological: Negative.  Negative for adenopathy. Does not bruise/bleed easily.  Psychiatric/Behavioral: Negative.  Negative for depression and sleep disturbance. The patient is not nervous/anxious.      VITALS:  Blood pressure 120/65, pulse 75, temperature 98.7 F (37.1 C), resp. rate 18, height 5' 5.5" (1.664 m), weight 189 lb 6.4 oz (85.9 kg), SpO2 98 %.  Wt Readings from Last 3 Encounters:  03/18/22 193 lb 8 oz (87.8 kg)  03/16/22 193 lb 12 oz (87.9 kg)  03/12/22 192 lb 6.4 oz (87.3 kg)    Body mass index is 31.04 kg/m.  Performance status (ECOG): 1 - Symptomatic but  completely ambulatory  PHYSICAL EXAM:  Physical Exam Vitals and nursing note reviewed.  Constitutional:      General: He is not  in acute distress.    Appearance: Normal appearance. He is normal weight. He is not ill-appearing or toxic-appearing.  HENT:     Head: Normocephalic and atraumatic.     Mouth/Throat:     Mouth: Mucous membranes are moist.     Pharynx: Oropharynx is clear. No oropharyngeal exudate or posterior oropharyngeal erythema.     Comments: Tongue pallor Eyes:     General: No scleral icterus.    Extraocular Movements: Extraocular movements intact.     Conjunctiva/sclera: Conjunctivae normal.     Pupils: Pupils are equal, round, and reactive to light.  Cardiovascular:     Rate and Rhythm: Normal rate and regular rhythm.     Pulses: Normal pulses.     Heart sounds: Normal heart sounds. No murmur heard.    No friction rub. No gallop.  Pulmonary:     Effort: Pulmonary effort is normal. No respiratory distress.     Breath sounds: Normal breath sounds. No wheezing, rhonchi or rales.  Abdominal:     General: Bowel sounds are normal. There is no distension.     Palpations: Abdomen is soft. There is no hepatomegaly, splenomegaly or mass.     Tenderness: There is no abdominal tenderness.  Musculoskeletal:        General: No swelling or tenderness. Normal range of motion.     Cervical back: Normal range of motion and neck supple. No tenderness.     Right lower leg: No edema.     Left lower leg: No edema.  Lymphadenopathy:     Cervical: No cervical adenopathy.     Upper Body:     Right upper body: No supraclavicular or axillary adenopathy.     Left upper body: No supraclavicular or axillary adenopathy.     Lower Body: No right inguinal adenopathy. No left inguinal adenopathy.  Skin:    General: Skin is warm and dry.     Coloration: Skin is pale. Skin is not jaundiced.     Findings: No rash.  Neurological:     Mental Status: He is alert and oriented to person, place,  and time.     Cranial Nerves: No cranial nerve deficit.  Psychiatric:        Mood and Affect: Mood normal.        Behavior: Behavior normal.        Thought Content: Thought content normal.    LABS:   Lab Results  Component Value Date   PSA1 334.0 (H) 10/30/2021   PSA1 31.6 (H) 05/07/2021   PSA1 48.3 (H) 11/12/2020        Latest Ref Rng & Units 03/12/2022   12:00 AM 02/19/2022   12:00 AM 02/09/2022   10:48 AM  CBC  WBC  11.9     18.2     25.9   Hemoglobin 13.5 - 17.5 11.7     11.0     10.6   Hematocrit 41 - 53 36     34     33.6   Platelets 150 - 400 K/uL 238     285     286      This result is from an external source.      Latest Ref Rng & Units 03/12/2022    8:55 AM 02/19/2022   10:09 AM 02/09/2022    5:00 AM  CMP  Glucose 70 - 99 mg/dL 85  156  101   BUN 8 - 23 mg/dL 14  18  11  Creatinine 0.61 - 1.24 mg/dL 0.89  0.98  0.86   Sodium 135 - 145 mmol/L 141  140  136   Potassium 3.5 - 5.1 mmol/L 3.8  3.8  3.6   Chloride 98 - 111 mmol/L 108  104  106   CO2 22 - 32 mmol/L '26  25  23   '$ Calcium 8.9 - 10.3 mg/dL 9.0  9.1  7.8   Total Protein 6.5 - 8.1 g/dL 7.7  6.8    Total Bilirubin 0.3 - 1.2 mg/dL 0.4  0.5    Alkaline Phos 38 - 126 U/L 62  73    AST 15 - 41 U/L 22  18    ALT 0 - 44 U/L 21  20       No results found for: "CEA1", "CEA" / No results found for: "CEA1", "CEA" Lab Results  Component Value Date   PSA1 334.0 (H) 10/30/2021   No results found for: "VPX106" No results found for: "CAN125"  No results found for: "TOTALPROTELP", "ALBUMINELP", "A1GS", "A2GS", "BETS", "BETA2SER", "GAMS", "MSPIKE", "SPEI" Lab Results  Component Value Date   TIBC 398 01/29/2022   FERRITIN 206 01/29/2022   IRONPCTSAT 25 01/29/2022   No results found for: "LDH"  STUDIES:  EXAM:02/08/22 CHEST 2-VIEW COMPARISON:  11/20/2021   FINDINGS: Cardiac shadow is mildly enlarged. The lungs are well aerated without focal infiltrate or effusion. Right chest wall port is seen. No  bony abnormality is noted.   IMPRESSION: No acute abnormality noted  NM PET (PSMA) SKULL TO MID THIGH Result Date: 11/23/2021 CLINICAL DATA:  Prostate carcinoma with biochemical recurrence. EXAM: NUCLEAR MEDICINE PET SKULL BASE TO THIGH TECHNIQUE: 9.4 mCi F18 Piflufolastat (Pylarify) was injected intravenously. Full-ring PET imaging was performed from the skull base to thigh after the radiotracer. CT data was obtained and used for attenuation correction and anatomic localization. COMPARISON:  12/24/2020 FINDINGS: NECK No radiotracer activity in neck lymph nodes. Incidental CT finding: None. CHEST No tracer avid supraclavicular, axillary, mediastinal, or hilar lymph nodes. Interval development of multiple small tracer pulmonary nodules which are concerning for metastatic disease. These nodules are all very small measuring (no more than 5 mm) and are technically too small to characterize by PET-CT. However a few of these nodules are tracer avid including: -Posteromedial right upper lobe nodule measures 5 mm with SUV max of 4.89, image 81/4. -Nodule within the inferior lingula measures 5 mm within SUV max of 5.1, image 94/4. -Nodule within the medial right apex measures 3 mm with SUV max of 3.18, image 65/4. Incidental CT finding: Aortic atherosclerosis and multi vessel coronary artery calcifications. ABDOMEN/PELVIS Prostate: No focal activity in the prostate bed. Lymph nodes: No abnormal radiotracer accumulation within pelvic or abdominal nodes. Liver: No evidence of liver metastasis. Incidental CT finding: None. SKELETON There is been marked interval progression of tracer avid bone metastases. Innumerable tracer avid bone lesions are now identified compared with solitary tracer avid bone metastases noted on the previous exam. New index lesions include: -FDG avid lesion within the L3 vertebral body with SUV max of 36.52, image 139/4. No corresponding CT changes identified. -Tracer avid bone lesion within the  right iliac wing has an SUV max of 34.4, image 165/4. No corresponding CT changes identified. -parasymphyseal lesion within the left pubic bone has an SUV max of 42.15, image 191/4. No corresponding CT changes noted. -Previous tracer avid sclerotic bone lesion involving the right seventh rib has an SUV max of 27.67 on today's study, image 110/4.  Previously 27.6 on the previous exam. IMPRESSION: 1. Marked interval progression tracer avid bone metastases. 2. Interval development of multiple tiny lung nodules. Several of these nodules are tracer avid and are worrisome for pulmonary metastases. 3. Aortic Atherosclerosis (ICD10-I70.0). Coronary artery calcifications. Electronically Signed   By: Kerby Moors M.D.   On: 11/23/2021 10:50     HISTORY:   Past Medical History:  Diagnosis Date   Hyperlipidemia    Malignant neoplasm of prostate ([place])    Supraventricular tachycardia     Past Surgical History:  Procedure Laterality Date   CORONARY STENT INTERVENTION N/A 02/08/2022   Procedure: CORONARY STENT INTERVENTION;  Surgeon: Nelva Bush, MD;  Location: [facility];  Service: Cardiovascular;  Laterality: N/A;   LEFT HEART CATH AND CORONARY ANGIOGRAPHY N/A 02/08/2022   Procedure: LEFT HEART CATH AND CORONARY ANGIOGRAPHY;  Surgeon: Nelva Bush, MD;  Location: [facility];  Service: Cardiovascular;  Laterality: N/A;   PROSTATECTOMY  07/2014   TONSILLECTOMY      Family History  Problem Relation Age of Onset   Breast cancer Mother 56   Ovarian cancer Mother 60   Breast cancer Paternal Grandmother 75    Social History:  reports that he has never smoked. He has never used smokeless tobacco. He reports that he does not currently use alcohol. He reports that he does not use drugs.The patient is alone today.  Allergies:  Allergies  Allergen Reactions   Atorvastatin    Rosuvastatin Other (See Comments)    Other reaction(s): Muscle pain   Simvastatin Other (See Comments)     Other reaction(s): Joint pain, Muscle pain   Sulfa Antibiotics    Sulfamethoxazole-Trimethoprim Other (See Comments)    Unknown as to interaction was a baby.    Current Medications: Current Outpatient Medications  Medication Sig Dispense Refill   Alirocumab (PRALUENT [place]) Inject into the skin. One injection every 2 weeks Monday     aspirin 81 MG EC tablet Take 81 mg by mouth daily.     Cholecalciferol 25 MCG (1000 UT) tablet Take 1,000 Units by mouth in the morning and at bedtime.     clopidogrel (PLAVIX) 75 MG tablet Take 75 mg by mouth daily.     dexamethasone (DECADRON) 4 MG tablet Take 8 mg by mouth See admin instructions. Take two tablets by mouth twice a day (start the day before chemo, then take daily for 2 days starting te day after chemo; take with food)     diphenhydrAMINE (SOMINEX) 25 MG tablet Take 50 mg by mouth at bedtime.     ezetimibe (ZETIA) 10 MG tablet Take 10 mg by mouth daily.     isosorbide mononitrate (IMDUR) 30 MG 24 hr tablet Take 1 tablet by mouth every 8 (eight) hours as needed.     leuprolide (LUPRON) 22.5 MG injection Inject 22.5 mg into the muscle every 3 (three) months.     metoprolol tartrate (LOPRESSOR) 25 MG tablet Take 12.5 mg by mouth 2 (two) times daily.     morphine (MS CONTIN) 15 MG 12 hr tablet Take 1 tablet (15 mg total) by mouth every 12 (twelve) hours. 60 tablet 0   Multiple Vitamins-Minerals (MULTIVITAMIN WITH MINERALS) tablet Take 1 tablet by mouth daily.     nitroGLYCERIN (NITROSTAT) 0.4 MG SL tablet Place 0.4 mg under the tongue every 5 (five) minutes as needed for chest pain. Repeat every 5 minutes if needed for a total of 3 tablets in 15 minutes. If  no relief, CALL 911.     Omega-3 Fatty Acids (FISH OIL) 1000 MG CAPS Take 2,000 mg by mouth in the morning and at bedtime.     predniSONE (DELTASONE) 5 MG tablet Take 1 tablet (5 mg total) by mouth in the morning and at bedtime. 60 tablet 11   Royal Jelly-Bee Pollen-Ginseng (KOREAN GINSENG COMPLEX)  150-250-50 MG CAPS Take 1 capsule by mouth daily.     senna (SENOKOT) 8.6 MG TABS tablet Take 4 tablets by mouth 2 (two) times daily as needed for moderate constipation.     Zoledronic Acid (ZOMETA) 4 MG/100ML IVPB Inject 4 mg into the vein every 3 (three) months.     No current facility-administered medications for this visit.

## 2022-02-19 ENCOUNTER — Encounter: Payer: Self-pay | Admitting: Oncology

## 2022-02-19 ENCOUNTER — Other Ambulatory Visit: Payer: Self-pay | Admitting: Oncology

## 2022-02-19 ENCOUNTER — Inpatient Hospital Stay (INDEPENDENT_AMBULATORY_CARE_PROVIDER_SITE_OTHER): Payer: No Typology Code available for payment source | Admitting: Oncology

## 2022-02-19 ENCOUNTER — Inpatient Hospital Stay: Payer: No Typology Code available for payment source | Attending: Hematology and Oncology

## 2022-02-19 VITALS — BP 120/65 | HR 75 | Temp 98.7°F | Resp 18 | Ht 65.5 in | Wt 189.4 lb

## 2022-02-19 DIAGNOSIS — C7951 Secondary malignant neoplasm of bone: Secondary | ICD-10-CM | POA: Insufficient documentation

## 2022-02-19 DIAGNOSIS — Z79818 Long term (current) use of other agents affecting estrogen receptors and estrogen levels: Secondary | ICD-10-CM | POA: Diagnosis not present

## 2022-02-19 DIAGNOSIS — C61 Malignant neoplasm of prostate: Secondary | ICD-10-CM

## 2022-02-19 DIAGNOSIS — C7800 Secondary malignant neoplasm of unspecified lung: Secondary | ICD-10-CM | POA: Diagnosis not present

## 2022-02-19 DIAGNOSIS — C7952 Secondary malignant neoplasm of bone marrow: Secondary | ICD-10-CM

## 2022-02-19 LAB — CBC: RBC: 3.75 — AB (ref 3.87–5.11)

## 2022-02-19 LAB — CMP (CANCER CENTER ONLY)
ALT: 20 U/L (ref 0–44)
AST: 18 U/L (ref 15–41)
Albumin: 3.6 g/dL (ref 3.5–5.0)
Alkaline Phosphatase: 73 U/L (ref 38–126)
Anion gap: 11 (ref 5–15)
BUN: 18 mg/dL (ref 8–23)
CO2: 25 mmol/L (ref 22–32)
Calcium: 9.1 mg/dL (ref 8.9–10.3)
Chloride: 104 mmol/L (ref 98–111)
Creatinine: 0.98 mg/dL (ref 0.61–1.24)
GFR, Estimated: >60 mL/min (ref >60)
Glucose, Bld: 156 mg/dL — ABNORMAL HIGH (ref 70–99)
Potassium: 3.8 mmol/L (ref 3.5–5.1)
Sodium: 140 mmol/L (ref 135–145)
Total Bilirubin: 0.5 mg/dL (ref 0.3–1.2)
Total Protein: 6.8 g/dL (ref 6.5–8.1)

## 2022-02-19 LAB — CBC AND DIFFERENTIAL
HCT: 34 — AB (ref 41–53)
Hemoglobin: 11 — AB (ref 13.5–17.5)
Neutrophils Absolute: 14.01
Platelets: 285 10*3/uL (ref 150–400)
WBC: 18.2

## 2022-02-19 LAB — PSA: Prostatic Specific Antigen: 403.39 ng/mL — ABNORMAL HIGH (ref 0.00–4.00)

## 2022-02-19 LAB — CBC W DIFFERENTIAL (~~LOC~~ CC SCANNED REPORT)

## 2022-02-21 ENCOUNTER — Other Ambulatory Visit: Payer: Self-pay

## 2022-02-23 ENCOUNTER — Ambulatory Visit: Payer: Non-veteran care

## 2022-02-23 ENCOUNTER — Telehealth (HOSPITAL_COMMUNITY): Payer: Self-pay

## 2022-02-23 ENCOUNTER — Telehealth: Payer: Self-pay

## 2022-02-23 NOTE — Telephone Encounter (Signed)
Phase II referral for Cardiac Rehab faxed to [facility]. 

## 2022-02-23 NOTE — Telephone Encounter (Signed)
-----   Message from [name], MD sent at 02/22/2022  7:44 PM EST ----- Regarding: call Tell him PSA stable at 403 and rest looks good, BS 156

## 2022-02-23 NOTE — Telephone Encounter (Signed)
Patient notified of his lab results.

## 2022-02-24 ENCOUNTER — Encounter: Payer: Self-pay | Admitting: Physician Assistant

## 2022-02-24 ENCOUNTER — Encounter: Payer: Self-pay | Admitting: Oncology

## 2022-02-24 ENCOUNTER — Ambulatory Visit: Payer: Non-veteran care | Attending: Physician Assistant | Admitting: Physician Assistant

## 2022-02-24 VITALS — BP 126/64 | HR 57 | Ht 65.5 in | Wt 192.2 lb

## 2022-02-24 DIAGNOSIS — I10 Essential (primary) hypertension: Secondary | ICD-10-CM

## 2022-02-24 DIAGNOSIS — I2510 Atherosclerotic heart disease of native coronary artery without angina pectoris: Secondary | ICD-10-CM | POA: Diagnosis not present

## 2022-02-24 DIAGNOSIS — E785 Hyperlipidemia, unspecified: Secondary | ICD-10-CM | POA: Diagnosis not present

## 2022-02-24 NOTE — Patient Instructions (Signed)
Medication Instructions:   Your physician recommends that you continue on your current medications as directed. Please refer to the Current Medication list given to you today.  *If you need a refill on your cardiac medications before your next appointment, please call your pharmacy*  Lab Work: Your physician recommends that you return for lab work on day that you follow up with Dr. Gwenlyn Found:  Fasting Lipid Panel-DO NOT eat or drink past midnight. Okay to have water to drink the morning of.  Hepatic (Liver) Function Test  If you have labs (blood work) drawn today and your tests are completely normal, you will receive your results only by: MyChart Message (if you have MyChart) OR A paper copy in the mail If you have any lab test that is abnormal or we need to change your treatment, we will call you to review the results.  Testing/Procedures: NONE ordered at this time of appointment   Follow-Up: At [facility], you and your health needs are our priority.  As part of our continuing mission to provide you with exceptional heart care, we have created designated Provider Care Teams.  These Care Teams include your primary Cardiologist (physician) and Advanced Practice Providers (APPs -  Physician Assistants and Nurse Practitioners) who all work together to provide you with the care you need, when you need it.    Your next appointment:   3 month(s)  The format for your next appointment:   In Person  Provider:   Quay Burow, MD     Other Instructions   Important Information About Sugar

## 2022-02-24 NOTE — Progress Notes (Signed)
Cardiology Office Note:    Date:  02/26/2022   ID:  Marvin Johnson, DOB 05-14-1950, MRN [id number]  PCP:  Earline Mayotte, [employer] Providers Cardiologist:  Quay Burow, MD     Referring MD: Earline Mayotte, MD   Chief Complaint  Patient presents with   Follow-up    Seen for Dr. Gwenlyn Found    History of Present Illness:    Marvin Johnson is a 71 y.o. male with a hx of CAD, hypertension, hyperlipidemia and history of prostate cancer.  Patient had a history of DES to LAD, RCA and the distal left circumflex artery in 2017 at [place] in [place] in 2020 was abnormal, he subsequently underwent cardiac catheterization on 04/21/2018 which showed obstructive distal RCA lesion that was treated with PCI.  The last time he was seen by cardiology service at [facility] was on 07/31/2018.  More recently, he was admitted to the hospital on 02/08/2022 with NSTEMI.  Initial EKG showed Q waves in the inferior lead and a global ST depression with elevation in the aVR.  Echocardiogram obtained on the day of arrival showed EF 55 to 60%, grade 1 DD, trivial MR.  Subsequent cardiac catheterization performed on 02/08/2022 showed severe two-vessel CAD with severe lesion in the previously stented OM1 and the mid left circumflex as well as sequential 80 to 90% lesion in RPL A.  EF 55 to 65%, LVEDP 18 mmHg.  Patient underwent successful PCI to OM1 with Synergy 2.25 x 24 mm DES that was overlapped with the previously placed stent, he also received Onyx frontier 2.0 x 15 mm DES to distal left circumflex artery that overlapped with previously placed stent.  Patient had a jailed small OM2 branch that had 90% ostial stenosis but had TIMI-3 flow.  Postprocedure, patient was placed on aspirin and Plavix with recommendation to continue dual antiplatelet therapy indefinitely.  He has a history of intolerance to statin, he was previously placed on Praluent however did not take Praluent  for several weeks prior to arrival.  On the day of discharge, he had a white blood cell count spike from baseline of 9 up to 21.  He was afebrile with no infectious symptoms.  Procalcitonin was negative.  Repeat CBC showed white blood cell count of 25.9.  Urinalysis was negative.  Urine culture was negative.  Since discharge, patient has been seen by oncology/hematology service, white blood cell count has decreased to 18.2 on repeat blood work on 02/19/2022.  Patient presents today for follow-up along with his son.  He denies any chest pain or shortness of breath.  He has been compliant with aspirin and Plavix.  He has not received Praluent from [facility] in over a month.  His son is going to call the [facility] to see if they can get the Praluent sooner.  Overall, he is doing well and can follow-up with Dr. Gwenlyn Found in February, he will need a fasting lipid panel and LFT at the time.   Past Medical History:  Diagnosis Date   Hyperlipidemia    Malignant neoplasm of prostate ([place])    Supraventricular tachycardia     Past Surgical History:  Procedure Laterality Date   CORONARY STENT INTERVENTION N/A 02/08/2022   Procedure: CORONARY STENT INTERVENTION;  Surgeon: Nelva Bush, MD;  Location: [place] [facility];  Service: Cardiovascular;  Laterality: N/A;   LEFT HEART CATH AND CORONARY ANGIOGRAPHY N/A 02/08/2022   Procedure: LEFT HEART CATH  AND CORONARY ANGIOGRAPHY;  Surgeon: Nelva Bush, MD;  Location: [place] [facility];  Service: Cardiovascular;  Laterality: N/A;   PROSTATECTOMY  07/2014   TONSILLECTOMY      Current Medications: Current Meds  Medication Sig   Alirocumab (PRALUENT [place]) Inject into the skin. One injection every 2 weeks Monday   aspirin 81 MG EC tablet Take 81 mg by mouth daily.   Cholecalciferol 25 MCG (1000 UT) tablet Take 1,000 Units by mouth in the morning and at bedtime.   clopidogrel (PLAVIX) 75 MG tablet Take 75 mg by mouth daily.   dexamethasone (DECADRON) 4 MG tablet  Take 8 mg by mouth See admin instructions. Take two tablets by mouth twice a day (start the day before chemo, then take daily for 2 days starting te day after chemo; take with food)   diphenhydrAMINE (SOMINEX) 25 MG tablet Take 50 mg by mouth at bedtime.   ezetimibe (ZETIA) 10 MG tablet Take 10 mg by mouth daily.   isosorbide mononitrate (IMDUR) 30 MG 24 hr tablet Take 1 tablet by mouth every 8 (eight) hours as needed.   leuprolide (LUPRON) 22.5 MG injection Inject 22.5 mg into the muscle every 3 (three) months.   metoprolol tartrate (LOPRESSOR) 25 MG tablet Take 12.5 mg by mouth 2 (two) times daily.   Multiple Vitamins-Minerals (MULTIVITAMIN WITH MINERALS) tablet Take 1 tablet by mouth daily.   nitroGLYCERIN (NITROSTAT) 0.4 MG SL tablet Place 0.4 mg under the tongue every 5 (five) minutes as needed for chest pain. Repeat every 5 minutes if needed for a total of 3 tablets in 15 minutes. If no relief, CALL 911.   Omega-3 Fatty Acids (FISH OIL) 1000 MG CAPS Take 2,000 mg by mouth in the morning and at bedtime.   predniSONE (DELTASONE) 5 MG tablet Take 1 tablet (5 mg total) by mouth in the morning and at bedtime.   Royal Jelly-Bee Pollen-Ginseng (KOREAN GINSENG COMPLEX) 150-250-50 MG CAPS Take 1 capsule by mouth daily.   senna (SENOKOT) 8.6 MG TABS tablet Take 4 tablets by mouth 2 (two) times daily as needed for moderate constipation.   Zoledronic Acid (ZOMETA) 4 MG/100ML IVPB Inject 4 mg into the vein every 3 (three) months.   [DISCONTINUED] morphine (MS CONTIN) 15 MG 12 hr tablet Take 1 tablet (15 mg total) by mouth every 12 (twelve) hours.     Allergies:   Atorvastatin, Rosuvastatin, Simvastatin, Sulfa antibiotics, and Sulfamethoxazole-trimethoprim   Social History   Socioeconomic History   Marital status: Divorced    Spouse name: Not on file   Number of children: Not on file   Years of education: Not on file   Highest education level: Not on file  Occupational History   Not on file   Tobacco Use   Smoking status: Never   Smokeless tobacco: Never  Substance and Sexual Activity   Alcohol use: Not Currently   Drug use: Never   Sexual activity: Not on file  Other Topics Concern   Not on file  Social History Narrative   Not on file   Social Determinants of Health   Financial Resource Strain: Not on file  Food Insecurity: No Food Insecurity (02/08/2022)   Hunger Vital Sign    Worried About Running Out of Food in the Last Year: Never true    Ran Out of Food in the Last Year: Never true  Transportation Needs: No Transportation Needs (02/08/2022)   PRAPARE - Hydrologist (Medical): No  Lack of Transportation (Non-Medical): No  Physical Activity: Not on file  Stress: Not on file  Social Connections: Not on file     Family History: The patient's family history includes Breast cancer (age of onset: 65) in his paternal grandmother; Breast cancer (age of onset: 30) in his mother; Ovarian cancer (age of onset: 53) in his mother.  ROS:   Please see the history of present illness.     All other systems reviewed and are negative.  EKGs/Labs/Other Studies Reviewed:    The following studies were reviewed today:  Echo 02/08/2022 1. Left ventricular ejection fraction, by estimation, is 55 to 60%. The  left ventricle has normal function. The left ventricle has no regional  wall motion abnormalities. Left ventricular diastolic parameters are  consistent with Grade I diastolic  dysfunction (impaired relaxation).   2. Right ventricular systolic function is normal. The right ventricular  size is normal. Tricuspid regurgitation signal is inadequate for assessing  PA pressure.   3. The mitral valve is normal in structure. Trivial mitral valve  regurgitation. No evidence of mitral stenosis.   4. The aortic valve is tricuspid. There is mild calcification of the  aortic valve. Aortic valve regurgitation is not visualized. No aortic  stenosis is  present.   5. The inferior vena cava is normal in size with greater than 50%  respiratory variability, suggesting right atrial pressure of 3 mmHg.    Cath 02/08/2022 Conclusions: Severe two-vessel coronary artery disease, including severe lesions distal to previously stented OM1 and mid LCx as well as sequential 80-90% and 70% stenoses in RPLA. Patent ostial through mid LAD stent with up to 30% in-stent proximal in-stent restenosis. Patent LCx/OM1 bifurcation stenting with severe disease distal to both stent margins.  There is also 30% in-stent restenosis in the mid LCx just after the takeoff of OM1. Widely patent proximal/mid and distal RCA stents. Normal left ventricular systolic function (LVEF 03-00%) with mildly elevated filling pressure (LVEDP 18 mmHg). Successful PCI to OM1 using Synergy 2.25 x 24 mm drug-eluting stent that overlaps previously placed stent with 0% residual stenosis and TIMI-3 flow. Successful PCI to distal LCx using Onyx Frontier 2.0 x 15 mm drug-eluting stent that overlaps previously placed stent with 0% residual stenosis and TIMI-3 flow.  Jailed small OM2 branch shows 90% ostial stenosis but TIMI-3 flow. Successful PCI to RPLA/PL2 using overlapping Synergy 2.5 x 24 mm and Onyx Frontier 2.0 x 12 mm drug-eluting stents with 0% residual stenosis and TIMI-3 flow.   Recommendations: Continue indefinite dual antiplatelet therapy with aspirin and clopidogrel. Aggressive secondary prevention of CAD. Gentle post catheterization hydration.  EKG:  EKG is ordered today.  The ekg ordered today demonstrates normal sinus rhythm, no significant ST-T wave changes.  Recent Labs: 02/07/2022: B Natriuretic Peptide 81.6 02/19/2022: ALT 20; BUN 18; Creatinine 0.98; Hemoglobin 11.0; Platelets 285; Potassium 3.8; Sodium 140  Recent Lipid Panel    Component Value Date/Time   CHOL 178 02/08/2022 0838   TRIG 122 02/08/2022 0838   HDL 60 02/08/2022 0838   CHOLHDL 3.0 02/08/2022 0838    VLDL 24 02/08/2022 0838   LDLCALC 94 02/08/2022 0838     Risk Assessment/Calculations:           Physical Exam:    VS:  BP 126/64   Pulse (!) 57   Ht 5' 5.5" (1.664 m)   Wt 192 lb 3.2 oz (87.2 kg)   SpO2 96%   BMI 31.50 kg/m  Wt Readings from Last 3 Encounters:  02/25/22 191 lb 12 oz (87 kg)  02/24/22 192 lb 3.2 oz (87.2 kg)  02/19/22 189 lb 6.4 oz (85.9 kg)     GEN:  Well nourished, well developed in no acute distress HEENT: Normal NECK: No JVD; No carotid bruits LYMPHATICS: No lymphadenopathy CARDIAC: RRR, no murmurs, rubs, gallops RESPIRATORY:  Clear to auscultation without rales, wheezing or rhonchi  ABDOMEN: Soft, non-tender, non-distended MUSCULOSKELETAL:  No edema; No deformity  SKIN: Warm and dry NEUROLOGIC:  Alert and oriented x 3 PSYCHIATRIC:  Normal affect   ASSESSMENT:    1. Coronary artery disease involving native coronary artery of native heart without angina pectoris   2. Hyperlipidemia, unspecified hyperlipidemia type   3. Primary hypertension    PLAN:    In order of problems listed above:  CAD: Recently underwent DES to OM1 and distal left circumflex artery.  He denies any chest pain and has been compliant with aspirin and Plavix.  Hyperlipidemia: His son will [facility] to get [facility]uent from [facility].  Hypertension: Blood pressure stable.           Medication Adjustments/Labs and Tests Ordered: Current medicines are reviewed at length with the patient today.  Concerns regarding medicines are outlined above.  Orders Placed This Encounter  Procedures   Lipid panel   Hepatic function panel   EKG 12-Lead   No orders of the defined types were placed in this encounter.   Patient Instructions  Medication Instructions:   Your physician recommends that you continue on your current medications as directed. Please refer to the Current Medication list given to you today.  *If you need a refill on your cardiac medications before your  next appointment, please call your pharmacy*  Lab Work: Your physician recommends that you return for lab work on day that you follow up with Dr. Gwenlyn Found:  Fasting Lipid Panel-DO NOT eat or drink past midnight. Okay to have water to drink the morning of.  Hepatic (Liver) Function Test  If you have labs (blood work) drawn today and your tests are completely normal, you will receive your results only by: MyChart Message (if you have MyChart) OR A paper copy in the mail If you have any lab test that is abnormal or we need to change your treatment, we will call you to review the results.  Testing/Procedures: NONE ordered at this time of appointment   Follow-Up: At [facility], you and your health needs are our priority.  As part of our continuing mission to provide you with exceptional heart care, we have created designated Provider Care Teams.  These Care Teams include your primary Cardiologist (physician) and Advanced Practice Providers (APPs -  Physician Assistants and Nurse Practitioners) who all work together to provide you with the care you need, when you need it.    Your next appointment:   3 month(s)  The format for your next appointment:   In Person  Provider:   Quay Burow, MD     Other Instructions   Important Information About Sugar         Hilbert Corrigan, [place]  02/26/2022 9:50 PM    [place]

## 2022-02-25 ENCOUNTER — Inpatient Hospital Stay: Payer: No Typology Code available for payment source

## 2022-02-25 ENCOUNTER — Ambulatory Visit: Payer: Non-veteran care

## 2022-02-25 ENCOUNTER — Other Ambulatory Visit: Payer: Self-pay

## 2022-02-25 ENCOUNTER — Encounter: Payer: Self-pay | Admitting: Oncology

## 2022-02-25 VITALS — BP 131/55 | HR 56 | Temp 97.9°F | Resp 18 | Ht 65.5 in | Wt 191.8 lb

## 2022-02-25 DIAGNOSIS — C61 Malignant neoplasm of prostate: Secondary | ICD-10-CM | POA: Diagnosis not present

## 2022-02-25 MED ORDER — SODIUM CHLORIDE 0.9 % IV SOLN: Freq: Once | INTRAVENOUS | Status: AC

## 2022-02-25 MED ORDER — HEPARIN SOD (PORK) LOCK FLUSH 100 UNIT/ML IV SOLN
500.0000 [IU] | Freq: Once | INTRAVENOUS | Status: AC | PRN
  Administered 2022-02-25: 500 [IU]

## 2022-02-25 MED ORDER — SODIUM CHLORIDE 0.9% FLUSH
10.0000 mL | Freq: Once | INTRAVENOUS | Status: AC | PRN
  Administered 2022-02-25: 10 mL

## 2022-02-25 MED ORDER — ZOLEDRONIC ACID 4 MG/100ML IV SOLN
4.0000 mg | Freq: Once | INTRAVENOUS | Status: AC
  Administered 2022-02-25: 4 mg via INTRAVENOUS
  Filled 2022-02-25: qty 100

## 2022-02-25 MED ORDER — LEUPROLIDE ACETATE (3 MONTH) 22.5 MG IM KIT
22.5000 mg | PACK | Freq: Once | INTRAMUSCULAR | Status: AC
  Administered 2022-02-25: 22.5 mg via INTRAMUSCULAR
  Filled 2022-02-25: qty 22.5

## 2022-02-25 NOTE — Patient Instructions (Signed)

## 2022-02-26 ENCOUNTER — Other Ambulatory Visit: Payer: Self-pay

## 2022-02-26 ENCOUNTER — Encounter: Payer: Self-pay | Admitting: Physician Assistant

## 2022-02-26 ENCOUNTER — Other Ambulatory Visit: Payer: Self-pay | Admitting: Hematology and Oncology

## 2022-02-26 DIAGNOSIS — C7951 Secondary malignant neoplasm of bone: Secondary | ICD-10-CM

## 2022-02-26 DIAGNOSIS — C61 Malignant neoplasm of prostate: Secondary | ICD-10-CM

## 2022-02-26 MED ORDER — MORPHINE SULFATE ER 15 MG PO TBCR: 15.0000 mg | EXTENDED_RELEASE_TABLET | Freq: Two times a day (BID) | ORAL | 0 refills | Status: DC

## 2022-02-26 NOTE — Telephone Encounter (Signed)
Opened in error

## 2022-03-12 ENCOUNTER — Inpatient Hospital Stay: Payer: No Typology Code available for payment source

## 2022-03-12 ENCOUNTER — Inpatient Hospital Stay (INDEPENDENT_AMBULATORY_CARE_PROVIDER_SITE_OTHER): Payer: No Typology Code available for payment source | Admitting: Oncology

## 2022-03-12 ENCOUNTER — Encounter: Payer: Self-pay | Admitting: Oncology

## 2022-03-12 ENCOUNTER — Other Ambulatory Visit (INDEPENDENT_AMBULATORY_CARE_PROVIDER_SITE_OTHER): Payer: No Typology Code available for payment source | Admitting: Oncology

## 2022-03-12 ENCOUNTER — Telehealth: Payer: Self-pay | Admitting: Oncology

## 2022-03-12 VITALS — BP 128/79 | HR 68 | Temp 97.9°F | Resp 17 | Ht 65.5 in | Wt 192.4 lb

## 2022-03-12 DIAGNOSIS — C7951 Secondary malignant neoplasm of bone: Secondary | ICD-10-CM

## 2022-03-12 DIAGNOSIS — C7952 Secondary malignant neoplasm of bone marrow: Secondary | ICD-10-CM

## 2022-03-12 DIAGNOSIS — C61 Malignant neoplasm of prostate: Secondary | ICD-10-CM

## 2022-03-12 DIAGNOSIS — C7800 Secondary malignant neoplasm of unspecified lung: Secondary | ICD-10-CM

## 2022-03-12 LAB — CMP (CANCER CENTER ONLY)
ALT: 21 U/L (ref 0–44)
AST: 22 U/L (ref 15–41)
Albumin: 3.4 g/dL — ABNORMAL LOW (ref 3.5–5.0)
Alkaline Phosphatase: 62 U/L (ref 38–126)
Anion gap: 7 (ref 5–15)
BUN: 14 mg/dL (ref 8–23)
CO2: 26 mmol/L (ref 22–32)
Calcium: 9 mg/dL (ref 8.9–10.3)
Chloride: 108 mmol/L (ref 98–111)
Creatinine: 0.89 mg/dL (ref 0.61–1.24)
GFR, Estimated: >60 mL/min (ref >60)
Glucose, Bld: 85 mg/dL (ref 70–99)
Potassium: 3.8 mmol/L (ref 3.5–5.1)
Sodium: 141 mmol/L (ref 135–145)
Total Bilirubin: 0.4 mg/dL (ref 0.3–1.2)
Total Protein: 7.7 g/dL (ref 6.5–8.1)

## 2022-03-12 LAB — CBC AND DIFFERENTIAL
HCT: 36 — AB (ref 41–53)
Hemoglobin: 11.7 — AB (ref 13.5–17.5)
Neutrophils Absolute: 6.55
Platelets: 238 10*3/uL (ref 150–400)
WBC: 11.9

## 2022-03-12 LAB — PSA: Prostatic Specific Antigen: 284.59 ng/mL — ABNORMAL HIGH (ref 0.00–4.00)

## 2022-03-12 LAB — CBC: RBC: 4.01 (ref 3.87–5.11)

## 2022-03-12 MED FILL — Docetaxel Soln for IV Infusion 160 MG/16ML: INTRAVENOUS | Qty: 12 | Status: AC

## 2022-03-12 MED FILL — Dexamethasone Sodium Phosphate Inj 100 MG/10ML: INTRAMUSCULAR | Qty: 1 | Status: AC

## 2022-03-12 NOTE — Progress Notes (Signed)
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 He has been on Enzalutamide 160 mg daily, as well as leuprolide 22.5 mg, and zoledronic acid every 3 months.  His pain is controlled with MS Contin 15 mg twice daily with occasional Tylenol for breakthrough.   He has been on Enzalutamide since June 2018.  He then had a teadily rising PSA, up to 83.49 in November 2022.  PSMA PET in October 2022 revealed an oligometastasis skeletal lesion it in the anterior right 7th rib, with increased activity, and very subtle sclerotic change at T10, but no evidence of progressive disease.The PSA has continued to fluctuate up and down.  It was down to 31 in February of 2023, then back up to 2 in May.  It then increased dramatically to 334 in August and he was scheduled for a PSMA PET scan.  This revealed severe progression of disease with several tiny lung nodules which did have hypermetabolic activity with SUV up to 5.  He had increased bone lesions which are now innumerable, including major lesions at L3, the right iliac wing, and the left pubic bone. These were not visible on CT scan. He stopped the Enzalutamide and started the Taxotere chemotherapy in September 2023 and is tolerating well. H His PSA went down from 536 to 400.75 after 3 cycles and remains stable at 403.39 after the 4th cycle.   Secondary malignant neoplasm of bone and bone marrow ([place]) He continues zoledronic acid every 3 months in addition to his cancer therapy. He last received this on February 11, 2022.  He now has marked progression of his bone metastases but minimal symptoms.  Anemia. This is steadily improving, so may have been more related to his bone metastases rather  than chemotherapy. Hemoglobin is up from 11.0 to 11.7.  Leukocytosis This was due to the Neulasta injection and his white count 11.9 today.    Plan:  He had a dramatic change in his bone metastases in August and now there are several tiny but suspicious lung lesions.  His PSA had jumped from 78 to 334, which prompted the new scan.  We stopped his enzalutamide and started Taxotere chemotherapy every 3 weeks in September, and he will have his 5th cycle next week. By October, his PSA was up to 536 but came down to 400.75 and remains stable after the 3rd cycle.. Today's PSA is pending. He has improving anemia so may have been more related to his bone metastases rather than chemotherapy. He will return in 3 weeks with CBC, CMP and PSA and have his 6th cycle. The patient understands the plans discussed today and is in agreement with them.  He knows to contact our office if he develops concerns prior to his next appointment.   His questions were answered.   I provided 25 minutes of face-to-face time during this encounter and > 50% was spent counseling as documented under my assessment and plan.    ADDENDUM: His PSA came down nicely to 284.59 from 403.39.   [name], MD  [facility] [address] [place] [address] Dept: [phone] Dept Fax: [phone]   Orders Placed This Encounter  Procedures  CBC and differential    This external order was created through the Results Console.   CBC    This external order was created through the Results Console.   I,Alexis Herring,acting as a scribe for [name], MD.,have documented all relevant documentation on the behalf of [name], MD,as directed by  [name], MD while in the presence of [name], MD.  I have reviewed this report as typed by the medical scribe, and it is complete and accurate.  [name]   04/04/22   7:29 PM     CHIEF COMPLAINT:  CC: Metastatic prostate cancer to bone, progressive  Current Treatment: Leuprolide/zoledronic acid and docetaxel  HISTORY OF PRESENT ILLNESS:  Marvin Johnson is a 71 year old male with a history of stage IV (T3b N1 M0) prostate cancer diagnosed in January 2016 when he was found to have a PSA of 51.8.  He was treated with a laparoscopic prostatectomy in May 2016.  Pathology revealed adenocarcinoma with a Gleason 8.  There was a positive posterior margin with extraprostatic extension, as well as left retro-urethral tissue positive and extension to the seminal vesicles bilaterally with multiple positive margins.  Two lymph nodes from the right side were negative, but 2/4 lymph nodes from the left side were positive for metastasis. Repeat bone scan in June 2016 revealed a new lesion at T10, which was not present on his baseline bone scan.  PSA at Dr. [name] office in early August 2016 remained elevated at 56.4.   We began seeing him in August 2016. The PSA was up to 216.3 in late August 2016, so hormonal therapy was recommended  X-rays of the thoracic spine revealed osteoarthritis and scoliosis, as well as evidence of demineralization, but we could not visualize a metastatic lesion.[name] He started leuprolide in September 2016.  The PSA initially dropped steadily with leuprolide.  Bone density scan in November 2016 revealed osteopenia, with a T-score of -2.1 in the spine and a T-score of -0.9 in the femur.  He had been taking vitamin-D 1000 international units daily, but not calcium, so calcium 600 mg twice daily was added at that time.    Leuprolide was held in February 2017 because of chest pain.  EKG was unremarkable.  He went to the [place] and he was referred to the [facility] for placement of 2 coronary artery stents for 95% and 99% occlusions.  He then had a third coronary artery stent placed later in the summer.  Leuprolide was resumed in March 2017.  In September 2017, he  had an increase in the PSA, so we repeated a bone scan.  This revealed intense uptake within the right posterior elements of T10, but no other areas of metastasis.  We then added bicalutamide 50 mg daily to his leuprolide.     He had a steadily increasing PSA despite the bicalutamide, so this was discontinued in March 2018.  He eventually was placed on MS Contin 15 mg twice daily, with oxycodone 10 mg every 4 hours as needed for breakthrough pain.  MRI thoracic spine in April 2018 revealed increasing tumor at T10 with epidural spread.  Bone scan at that time revealed increased activity in the T10 lesion, which was stable.  There was a questionable tiny focus in the right ischium/acetabular rim.  Plain x-rays of the bilateral hips and pelvis did not reveal any focal abnormality. The PSA increased from 27.5 in March, to 72.2 in May, then to 92.2  in June 2018.  He was referred for radiotherapy to the T10 lesion due to the severe pain with improvement in his pain. He was started on zoledronic acid along with his leuprolide in June 2018.  He had a Port-A-Cath placed in anticipation of chemotherapy, but then we recommended that he be placed on enzalutamide 160 mg daily instead, in order to delay the time to initiation of chemotherapy.  He started enzalutamide in June 2018.  The PSA became undetectable in October 2018.  We continued MS Contin, but discontinued the oxycodone. He did not tolerate meloxicam, so uses Tylenol as needed for breakthrough pain.     He had a stress test in January 2020 and was referred to [place] for coronary artery stenting, which was done in February.  Zoledronic acid had been given monthly for over 2 years, so was changed to every 3 months in October 2020.   The PSA started to increase in January 2021 at which time it was 0.2.  CT chest, abdomen and pelvis in March 2021 not reveal any lymphadenopathy or soft tissue metastatic disease.  Hepatic steatosis was seen.  There  was a stable sclerotic osseous lesion of the T10 vertebral body, as well as a subtle sclerotic lesion of the L3 vertebral body, also possibly a small metastatic lesion.  Whole body bone scan did not reveal continued increased activity of T10 and right acetabulum posteriorly.  The PSA was 0.5 in March.  PSA had increased to 3.0 in June.  Axumin PET imaging from July did not reveal any evidence of active prostate carcinoma, local recurrence, or metastatic disease. The PSA continued to slowly rise and was up to 5.9 in September, 10.9 in October, and 16.3 in December.     The PSA went up to 23.81 in March 2022, so an [place] PET scan was ordered which revealed an oligometastasis skeletal lesion in the anterior right 7th rib with very subtle sclerotic change at this level. PSA in June decreased to 18.6, but then in September increased to 58.  In view of the significant rise in his PSA, a prostate specific PET scan was ordered.  PSMA PET in October 2022 revealed a persistent focus of intense radiotracer activity in the anterior RIGHT seventh rib with increased in activity from comparison exam and subtle sclerotic changes on CT. Findings remain consistent with oligometastatic skeletal metastasis. Sclerotic lesions in the T10 vertebral body without radiotracer activity potentially represented prior treated prostate cancer metastasis.  We recommended he continue his current treatment with enzalutamide, leuprolide and zoledronic acid.  PSA in November continued to increase to 83.49.  Even though his PSA continued to steadily increase, we did not recommend a change in therapy based on this alone.  He had no severe pain and no increase in pain and was having a good quality of life.  The PSA has continued to fluctuate up and down and was 31 in February and 78 in May.   Oncology History  Malignant neoplasm of prostate ([place])  03/30/2014 Cancer Staging   Staging form: Prostate, AJCC 8th Edition - Clinical stage from  03/30/2014: Stage IVA (cT3b, cN1, cM0, PSA: 51.8, Grade Group: 4) - Signed by [name], MD on 11/14/2020 Histopathologic type: Adenocarcinoma, NOS Stage prefix: Initial diagnosis Prostate specific antigen (PSA) range: 20 or greater Gleason primary pattern: 4 Gleason secondary pattern: 4 Gleason score: 8 Histologic grading system: 5 grade system Location of positive needle core biopsies: Beyond primary site Prognostic indicators:  May 2016 Robotic prostatectomy with mult. Pos margins, extra prostatic extension, 2/6 pos nodes. Scan June 2016 positive at T10  PSA up to 216.3 by August Placed on lupron and casodex Stage used in treatment planning: Yes National guidelines used in treatment planning: Yes Type of national guideline used in treatment planning: NCCN   02/18/2020 Initial Diagnosis   Malignant neoplasm of prostate ([place])   12/01/2021 -  Chemotherapy   Patient is on Treatment Plan : PROSTATE Docetaxel (75) + Prednisone q21d     Secondary malignant neoplasm of bone and bone marrow ([place])  02/18/2020 Initial Diagnosis   Secondary malignant neoplasm of bone and bone marrow ([place])   12/01/2021 -  Chemotherapy   Patient is on Treatment Plan : PROSTATE Docetaxel (75) + Prednisone q21d         INTERVAL HISTORY:  Marvin Johnson is here today for repeat clinical assessment. In September we stopped the Enzalutamide and started him on Taxotere chemotherapy. He states that he is feeling well and he doesn't experience anymore chest pain. Although his arthritis is still giving him problems he shows no signs of neuropathy. In November he had a non-STEMI and had stents placed. He has done well since that time. His Lupron was given on 02/11/22. His upcoming Day 1 Cycle 5 is scheduled on 03/16/22. His next Cycle 6 will be scheduled on 04/08/22. Since he is tolerating it well I suggest we go past cycle 6 as long as his PSA continues to go down and he has no unacceptable toxicities. His last PSA is at  403.39 as compared to 400 the time before. His hemoglobin continues to improve from 11.0 to 11.7. I will see him in 3 weeks with CBC, CMP, and PSA. He still remains on MS Contin '15mg'$  every 12 hours and only requires Tylenol for breakthrough pain.He denies signs of infection such as sore throat, sinus drainage, cough, or urinary symptoms.  He denies fevers or recurrent chills. He denies pain. He denies nausea, vomiting, chest pain, dyspnea or cough. His weight has been stable.  REVIEW OF SYSTEMS:  Review of Systems  Constitutional:  Negative for appetite change, chills, diaphoresis, fatigue, fever and unexpected weight change.  HENT:  Negative.  Negative for hearing loss, lump/mass, mouth sores, nosebleeds, sore throat, tinnitus, trouble swallowing and voice change.   Eyes: Negative.  Negative for eye problems and icterus.  Respiratory: Negative.  Negative for chest tightness, cough, hemoptysis, shortness of breath and wheezing.   Cardiovascular: Negative.  Negative for chest pain, leg swelling and palpitations.  Gastrointestinal:  Negative for abdominal distention, abdominal pain, blood in stool, constipation, diarrhea, nausea, rectal pain and vomiting.  Endocrine: Negative.  Negative for hot flashes.  Genitourinary: Negative.  Negative for bladder incontinence, difficulty urinating, dysuria, frequency, hematuria, nocturia, pelvic pain and penile discharge.   Musculoskeletal:  Positive for arthralgias and myalgias. Negative for back pain, flank pain, gait problem, neck pain and neck stiffness.  Skin: Negative.  Negative for itching, rash and wound.  Neurological: Negative.  Negative for dizziness, extremity weakness, gait problem, headaches, light-headedness, numbness, seizures and speech difficulty.  Hematological: Negative.  Negative for adenopathy. Does not bruise/bleed easily.  Psychiatric/Behavioral: Negative.  Negative for confusion, decreased concentration, depression, sleep disturbance and  suicidal ideas. The patient is not nervous/anxious.      VITALS:  Blood pressure 128/79, pulse 68, temperature 97.9 F (36.6 C), temperature source Oral, resp. rate 17, height 5' 5.5" (1.664 m), weight 192 lb 6.4 oz (87.3 kg), SpO2  97 %.  Wt Readings from Last 3 Encounters:  04/02/22 193 lb 4.8 oz (87.7 kg)  03/18/22 193 lb 8 oz (87.8 kg)  03/16/22 193 lb 12 oz (87.9 kg)    Body mass index is 31.53 kg/m.  Performance status (ECOG): 1 - Symptomatic but completely ambulatory  PHYSICAL EXAM:  Physical Exam Vitals and nursing note reviewed.  Constitutional:      General: He is not in acute distress.    Appearance: Normal appearance. He is normal weight. He is not ill-appearing or toxic-appearing.  HENT:     Head: Normocephalic and atraumatic.     Mouth/Throat:     Mouth: Mucous membranes are moist.     Pharynx: Oropharynx is clear. No oropharyngeal exudate or posterior oropharyngeal erythema.  Eyes:     General: No scleral icterus.    Extraocular Movements: Extraocular movements intact.     Conjunctiva/sclera: Conjunctivae normal.     Pupils: Pupils are equal, round, and reactive to light.  Neck:     Vascular: No carotid bruit.  Cardiovascular:     Rate and Rhythm: Normal rate and regular rhythm.     Pulses: Normal pulses.     Heart sounds: Normal heart sounds. No murmur heard.    No friction rub. No gallop.  Pulmonary:     Effort: Pulmonary effort is normal. No respiratory distress.     Breath sounds: Normal breath sounds. No stridor. No wheezing, rhonchi or rales.  Chest:     Chest wall: No tenderness.  Abdominal:     General: Bowel sounds are normal. There is no distension.     Palpations: Abdomen is soft. There is no hepatomegaly, splenomegaly or mass.     Tenderness: There is no abdominal tenderness. There is no right CVA tenderness, left CVA tenderness, guarding or rebound.     Hernia: No hernia is present.  Musculoskeletal:        General: No swelling or  tenderness. Normal range of motion.     Cervical back: Normal range of motion and neck supple. No rigidity or tenderness.     Right lower leg: No edema.     Left lower leg: No edema.  Lymphadenopathy:     Cervical: No cervical adenopathy.     Upper Body:     Right upper body: No supraclavicular or axillary adenopathy.     Left upper body: No supraclavicular or axillary adenopathy.     Lower Body: No right inguinal adenopathy. No left inguinal adenopathy.  Skin:    General: Skin is warm and dry.     Coloration: Skin is not jaundiced or pale.     Findings: No bruising, erythema, lesion or rash.  Neurological:     Mental Status: He is alert and oriented to person, place, and time.     Cranial Nerves: No cranial nerve deficit.     Sensory: No sensory deficit.     Motor: No weakness.     Coordination: Coordination normal.     Gait: Gait normal.     Deep Tendon Reflexes: Reflexes normal.  Psychiatric:        Mood and Affect: Mood normal.        Behavior: Behavior normal.        Thought Content: Thought content normal.    LABS:   Lab Results  Component Value Date   PSA1 334.0 (H) 10/30/2021   PSA1 31.6 (H) 05/07/2021   PSA1 48.3 (H) 11/12/2020  Latest Ref Rng & Units 04/02/2022   12:00 AM 03/12/2022   12:00 AM 02/19/2022   12:00 AM  CBC  WBC  18.2     11.9     18.2      Hemoglobin 13.5 - 17.5 11.7     11.7     11.0      Hematocrit 41 - 53 37     36     34      Platelets 150 - 400 K/uL 227     238     285         This result is from an external source.      Latest Ref Rng & Units 04/02/2022   12:00 AM 03/12/2022    8:55 AM 02/19/2022   10:09 AM  CMP  Glucose 70 - 99 mg/dL  85  156   BUN 4 - '21 17     14  18   '$ Creatinine 0.6 - 1.3 0.8     0.89  0.98   Sodium 137 - 147 139     141  140   Potassium 3.5 - 5.1 mEq/L 3.9     3.8  3.8   Chloride 99 - 108 104     108  104   CO2 13 - '22 28     26  25   '$ Calcium 8.7 - 10.7 9.1     9.0  9.1   Total Protein 6.5 - 8.1 g/dL   7.7  6.8   Total Bilirubin 0.3 - 1.2 mg/dL  0.4  0.5   Alkaline Phos 25 - 125 101     62  73   AST 14 - 40 34     22  18   ALT 10 - 40 U/L '28     21  20      '$ This result is from an external source.     No results found for: "CEA1", "CEA" / No results found for: "CEA1", "CEA" Lab Results  Component Value Date   PSA1 334.0 (H) 10/30/2021   Component Ref Range & Units 3 wk ago (02/19/22) 1 mo ago (01/29/22) 2 mo ago (01/08/22) 2 mo ago (12/18/21) 3 mo ago (12/11/21) 3 mo ago (11/25/21) 7 mo ago (07/30/21)  Prostatic Specific Antigen 0.00 - 4.00 ng/mL 403.39 High  400.75 High  CM 536.11 High  CM 531.36 High  CM 482.63 High  CM 438.87 High  CM 78.11 High  CM    No results found for: "AYT016" No results found for: "CAN125"  No results found for: "TOTALPROTELP", "ALBUMINELP", "A1GS", "A2GS", "BETS", "BETA2SER", "GAMS", "MSPIKE", "SPEI" Lab Results  Component Value Date   TIBC 398 01/29/2022   FERRITIN 206 01/29/2022   IRONPCTSAT 25 01/29/2022   No results found for: "LDH"  STUDIES:  EXAM:: CHEST - 2 VIEW COMPARISON: 11/20/2021 FINDINGS: Cardiac shadow is mildly enlarged. The lungs are well aerated without focal infiltrate or effusion. Right chest wall port is seen. No bony abnormality is noted. IMPRESSION: No acute abnormality noted   NM PET (PSMA) SKULL TO MID THIGH Result Date: 11/23/2021 CLINICAL DATA:  Prostate carcinoma with biochemical recurrence. EXAM: NUCLEAR MEDICINE PET SKULL BASE TO THIGH TECHNIQUE: 9.4 mCi F18 Piflufolastat (Pylarify) was injected intravenously. Full-ring PET imaging was performed from the skull base to thigh after the radiotracer. CT data was obtained and used for attenuation correction and anatomic localization. COMPARISON:  12/24/2020 FINDINGS: NECK No radiotracer activity in neck lymph  nodes. Incidental CT finding: None. CHEST No tracer avid supraclavicular, axillary, mediastinal, or hilar lymph nodes. Interval development of multiple small tracer  pulmonary nodules which are concerning for metastatic disease. These nodules are all very small measuring (no more than 5 mm) and are technically too small to characterize by PET-CT. However a few of these nodules are tracer avid including: -Posteromedial right upper lobe nodule measures 5 mm with SUV max of 4.89, image 81/4. -Nodule within the inferior lingula measures 5 mm within SUV max of 5.1, image 94/4. -Nodule within the medial right apex measures 3 mm with SUV max of 3.18, image 65/4. Incidental CT finding: Aortic atherosclerosis and multi vessel coronary artery calcifications. ABDOMEN/PELVIS Prostate: No focal activity in the prostate bed. Lymph nodes: No abnormal radiotracer accumulation within pelvic or abdominal nodes. Liver: No evidence of liver metastasis. Incidental CT finding: None. SKELETON There is been marked interval progression of tracer avid bone metastases. Innumerable tracer avid bone lesions are now identified compared with solitary tracer avid bone metastases noted on the previous exam. New index lesions include: -FDG avid lesion within the L3 vertebral body with SUV max of 36.52, image 139/4. No corresponding CT changes identified. -Tracer avid bone lesion within the right iliac wing has an SUV max of 34.4, image 165/4. No corresponding CT changes identified. -parasymphyseal lesion within the left pubic bone has an SUV max of 42.15, image 191/4. No corresponding CT changes noted. -Previous tracer avid sclerotic bone lesion involving the right seventh rib has an SUV max of 27.67 on today's study, image 110/4. Previously 27.6 on the previous exam. IMPRESSION: 1. Marked interval progression tracer avid bone metastases. 2. Interval development of multiple tiny lung nodules. Several of these nodules are tracer avid and are worrisome for pulmonary metastases. 3. Aortic Atherosclerosis (ICD10-I70.0). Coronary artery calcifications. Electronically Signed   By: Kerby Moors M.D.   On: 11/23/2021  10:50     HISTORY:   Past Medical History:  Diagnosis Date   Hyperlipidemia    Malignant neoplasm of prostate ([place])    Supraventricular tachycardia     Past Surgical History:  Procedure Laterality Date   CORONARY STENT INTERVENTION N/A 02/08/2022   Procedure: CORONARY STENT INTERVENTION;  Surgeon: Nelva Bush, MD;  Location: [facility];  Service: Cardiovascular;  Laterality: N/A;   LEFT HEART CATH AND CORONARY ANGIOGRAPHY N/A 02/08/2022   Procedure: LEFT HEART CATH AND CORONARY ANGIOGRAPHY;  Surgeon: Nelva Bush, MD;  Location: [facility];  Service: Cardiovascular;  Laterality: N/A;   PROSTATECTOMY  07/2014   TONSILLECTOMY      Family History  Problem Relation Age of Onset   Breast cancer Mother 55   Ovarian cancer Mother 26   Breast cancer Paternal Grandmother 56    Social History:  reports that he has never smoked. He has never used smokeless tobacco. He reports that he does not currently use alcohol. He reports that he does not use drugs.The patient is alone today.  Allergies:  Allergies  Allergen Reactions   Atorvastatin    Rosuvastatin Other (See Comments)    Other reaction(s): Muscle pain   Simvastatin Other (See Comments)    Other reaction(s): Joint pain, Muscle pain   Sulfa Antibiotics    Sulfamethoxazole-Trimethoprim Other (See Comments)    Unknown as to interaction was a baby.    Current Medications: Current Outpatient Medications  Medication Sig Dispense Refill   Alirocumab (PRALUENT [place]) Inject into the skin. One injection every 2 weeks Monday  aspirin 81 MG EC tablet Take 81 mg by mouth daily.     Cholecalciferol 25 MCG (1000 UT) tablet Take 1,000 Units by mouth in the morning and at bedtime.     clopidogrel (PLAVIX) 75 MG tablet Take 75 mg by mouth daily.     dexamethasone (DECADRON) 4 MG tablet Take 8 mg by mouth See admin instructions. Take two tablets by mouth twice a day (start the day before chemo, then take daily for 2  days starting te day after chemo; take with food)     diphenhydrAMINE (BENADRYL) 50 MG capsule Take 50 mg by mouth at bedtime as needed.     ezetimibe (ZETIA) 10 MG tablet Take 10 mg by mouth daily.     isosorbide mononitrate (IMDUR) 30 MG 24 hr tablet Take 1 tablet by mouth every 8 (eight) hours as needed.     leuprolide (LUPRON) 22.5 MG injection Inject 22.5 mg into the muscle every 3 (three) months.     metoprolol tartrate (LOPRESSOR) 25 MG tablet Take 12.5 mg by mouth 2 (two) times daily.     morphine (MS CONTIN) 15 MG 12 hr tablet Take 1 tablet (15 mg total) by mouth every 12 (twelve) hours. 60 tablet 0   Multiple Vitamins-Minerals (MULTIVITAMIN WITH MINERALS) tablet Take 1 tablet by mouth daily.     nitroGLYCERIN (NITROSTAT) 0.4 MG SL tablet Place 0.4 mg under the tongue every 5 (five) minutes as needed for chest pain. Repeat every 5 minutes if needed for a total of 3 tablets in 15 minutes. If no relief, CALL 911.     Omega-3 Fatty Acids (FISH OIL) 1000 MG CAPS Take 2,000 mg by mouth in the morning and at bedtime.     predniSONE (DELTASONE) 5 MG tablet Take 1 tablet (5 mg total) by mouth in the morning and at bedtime. 60 tablet 11   Royal Jelly-Bee Pollen-Ginseng (KOREAN GINSENG COMPLEX) 150-250-50 MG CAPS Take 1 capsule by mouth daily.     senna (SENOKOT) 8.6 MG TABS tablet Take 4 tablets by mouth 2 (two) times daily as needed for moderate constipation.     Zoledronic Acid (ZOMETA) 4 MG/100ML IVPB Inject 4 mg into the vein every 3 (three) months.     No current facility-administered medications for this visit.      I,Jasmine M Lassiter,acting as a scribe for [name], MD.,have documented all relevant documentation on the behalf of [name], MD,as directed by  [name], MD while in the presence of [name], MD.

## 2022-03-12 NOTE — Telephone Encounter (Signed)
03/12/22 Next appt scheduled and confirmed with patient

## 2022-03-16 ENCOUNTER — Inpatient Hospital Stay: Payer: No Typology Code available for payment source | Attending: Hematology and Oncology

## 2022-03-16 VITALS — BP 125/61 | HR 65 | Temp 97.5°F | Resp 18 | Ht 65.5 in | Wt 193.8 lb

## 2022-03-16 DIAGNOSIS — C7951 Secondary malignant neoplasm of bone: Secondary | ICD-10-CM

## 2022-03-16 DIAGNOSIS — Z5111 Encounter for antineoplastic chemotherapy: Secondary | ICD-10-CM | POA: Diagnosis present

## 2022-03-16 DIAGNOSIS — C61 Malignant neoplasm of prostate: Secondary | ICD-10-CM | POA: Diagnosis not present

## 2022-03-16 DIAGNOSIS — Z79899 Other long term (current) drug therapy: Secondary | ICD-10-CM | POA: Diagnosis not present

## 2022-03-16 DIAGNOSIS — Z5189 Encounter for other specified aftercare: Secondary | ICD-10-CM | POA: Diagnosis not present

## 2022-03-16 MED ORDER — SODIUM CHLORIDE 0.9 % IV SOLN
60.0000 mg/m2 | Freq: Once | INTRAVENOUS | Status: AC
  Administered 2022-03-16: 120 mg via INTRAVENOUS
  Filled 2022-03-16: qty 12

## 2022-03-16 MED ORDER — SODIUM CHLORIDE 0.9 % IV SOLN: Freq: Once | INTRAVENOUS | Status: AC

## 2022-03-16 MED ORDER — SODIUM CHLORIDE 0.9% FLUSH
10.0000 mL | INTRAVENOUS | Status: DC | PRN
  Administered 2022-03-16: 10 mL

## 2022-03-16 MED ORDER — SODIUM CHLORIDE 0.9 % IV SOLN
10.0000 mg | Freq: Once | INTRAVENOUS | Status: AC
  Administered 2022-03-16: 10 mg via INTRAVENOUS
  Filled 2022-03-16: qty 10

## 2022-03-16 MED ORDER — HEPARIN SOD (PORK) LOCK FLUSH 100 UNIT/ML IV SOLN
500.0000 [IU] | Freq: Once | INTRAVENOUS | Status: AC | PRN
  Administered 2022-03-16: 500 [IU]

## 2022-03-16 NOTE — Patient Instructions (Signed)
Docetaxel Injection What is this medication? DOCETAXEL (doe se TAX el) treats some types of cancer. It works by slowing down the growth of cancer cells. This medicine may be used for other purposes; ask your health care provider or pharmacist if you have questions. COMMON BRAND NAME(S): Docefrez, Taxotere What should I tell my care team before I take this medication? They need to know if you have any of these conditions: Kidney disease Liver disease Low white blood cell levels Tingling of the fingers or toes or other nerve disorder An unusual or allergic reaction to docetaxel, polysorbate 80, other medications, foods, dyes, or preservatives Pregnant or trying to get pregnant Breast-feeding How should I use this medication? This medication is injected into a vein. It is given by your care team in a hospital or clinic setting. Talk to your care team about the use of this medication in children. Special care may be needed. Overdosage: If you think you have taken too much of this medicine contact a poison control center or emergency room at once. NOTE: This medicine is only for you. Do not share this medicine with others. What if I miss a dose? Keep appointments for follow-up doses. It is important not to miss your dose. Call your care team if you are unable to keep an appointment. What may interact with this medication? Do not take this medication with any of the following: Live virus vaccines This medication may also interact with the following: Certain antibiotics, such as clarithromycin, telithromycin Certain antivirals for HIV or hepatitis Certain medications for fungal infections, such as itraconazole, ketoconazole, voriconazole Grapefruit juice Nefazodone Supplements, such as St. John's wort This list may not describe all possible interactions. Give your health care provider a list of all the medicines, herbs, non-prescription drugs, or dietary supplements you use. Also tell them  if you smoke, drink alcohol, or use illegal drugs. Some items may interact with your medicine. What should I watch for while using this medication? This medication may make you feel generally unwell. This is not uncommon as chemotherapy can affect healthy cells as well as cancer cells. Report any side effects. Continue your course of treatment even though you feel ill unless your care team tells you to stop. You may need blood work done while you are taking this medication. This medication can cause serious side effects and infusion reactions. To reduce the risk, your care team may give you other medications to take before receiving this one. Be sure to follow the directions from your care team. This medication may increase your risk of getting an infection. Call your care team for advice if you get a fever, chills, sore throat, or other symptoms of a cold or flu. Do not treat yourself. Try to avoid being around people who are sick. Avoid taking medications that contain aspirin, acetaminophen, ibuprofen, naproxen, or ketoprofen unless instructed by your care team. These medications may hide a fever. Be careful brushing or flossing your teeth or using a toothpick because you may get an infection or bleed more easily. If you have any dental work done, tell your dentist you are receiving this medication. Some products may contain alcohol. Ask your care team if this medication contains alcohol. Be sure to tell all care teams you are taking this medicine. Certain medications, like metronidazole and disulfiram, can cause an unpleasant reaction when taken with alcohol. The reaction includes flushing, headache, nausea, vomiting, sweating, and increased thirst. The reaction can last from 30 minutes to several hours.  This medication may affect your coordination, reaction time, or judgement. Do not drive or operate machinery until you know how this medication affects you. Sit up or stand slowly to reduce the risk of  dizzy or fainting spells. Drinking alcohol with this medication can increase the risk of these side effects. Talk to your care team about your risk of cancer. You may be more at risk for certain types of cancer if you take this medication. Talk to your care team if you wish to become pregnant or think you might be pregnant. This medication can cause serious birth defects if taken during pregnancy or if you get pregnant within 2 months after stopping therapy. A negative pregnancy test is required before starting this medication. A reliable form of contraception is recommended while taking this medication and for 2 months after stopping it. Talk to your care team about reliable forms of contraception. Do not breast-feed while taking this medication and for 1 week after stopping therapy. Use a condom during sex and for 4 months after stopping therapy. Tell your care team right away if you think your partner might be pregnant. This medication can cause serious birth defects. This medication may cause infertility. Talk to your care team if you are concerned about your fertility. What side effects may I notice from receiving this medication? Side effects that you should report to your care team as soon as possible: Allergic reactions--skin rash, itching, hives, swelling of the face, lips, tongue, or throat Change in vision such as blurry vision, seeing halos around lights, vision loss Infection--fever, chills, cough, or sore throat Infusion reactions--chest pain, shortness of breath or trouble breathing, feeling faint or lightheaded Low red blood cell level--unusual weakness or fatigue, dizziness, headache, trouble breathing Pain, tingling, or numbness in the hands or feet Painful swelling, warmth, or redness of the skin, blisters or sores at the infusion site Redness, blistering, peeling, or loosening of the skin, including inside the mouth Sudden or severe stomach pain, bloody diarrhea, fever, nausea,  vomiting Swelling of the ankles, hands, or feet Tumor lysis syndrome (TLS)--nausea, vomiting, diarrhea, decrease in the amount of urine, dark urine, unusual weakness or fatigue, confusion, muscle pain or cramps, fast or irregular heartbeat, joint pain Unusual bruising or bleeding Side effects that usually do not require medical attention (report to your care team if they continue or are bothersome): Change in nail shape, thickness, or color Change in taste Hair loss Increased tears This list may not describe all possible side effects. Call your doctor for medical advice about side effects. You may report side effects to FDA at 1-800-FDA-1088. Where should I keep my medication? This medication is given in a hospital or clinic. It will not be stored at home. NOTE: This sheet is a summary. It may not cover all possible information. If you have questions about this medicine, talk to your doctor, pharmacist, or health care provider.  2023 Elsevier/Gold Standard (2007-04-22 00:00:00) [facility]  Discharge Instructions: Thank you for choosing [facility] to provide your oncology and hematology care.  If you have a lab appointment with the [facility], please go directly to the [facility] and check in at the registration area.   Wear comfortable clothing and clothing appropriate for easy access to any Portacath or PICC line.   We strive to give you quality time with your provider. You may need to reschedule your appointment if you arrive late (15 or more minutes).  Arriving  late affects you and other patients whose appointments are after yours.  Also, if you miss three or more appointments without notifying the office, you may be dismissed from the clinic at the provider's discretion.      For prescription refill requests, have your pharmacy contact our office and allow 72 hours for refills to be completed.    Today you received the following  chemotherapy and/or immunotherapy agents docetaxel      To help prevent nausea and vomiting after your treatment, we encourage you to take your nausea medication as directed.  BELOW ARE SYMPTOMS THAT SHOULD BE REPORTED IMMEDIATELY: *FEVER GREATER THAN 100.4 F (38 C) OR HIGHER *CHILLS OR SWEATING *NAUSEA AND VOMITING THAT IS NOT CONTROLLED WITH YOUR NAUSEA MEDICATION *UNUSUAL SHORTNESS OF BREATH *UNUSUAL BRUISING OR BLEEDING *URINARY PROBLEMS (pain or burning when urinating, or frequent urination) *BOWEL PROBLEMS (unusual diarrhea, constipation, pain near the anus) TENDERNESS IN MOUTH AND THROAT WITH OR WITHOUT PRESENCE OF ULCERS (sore throat, sores in mouth, or a toothache) UNUSUAL RASH, SWELLING OR PAIN  UNUSUAL VAGINAL DISCHARGE OR ITCHING   Items with * indicate a potential emergency and should be followed up as soon as possible or go to the Emergency Department if any problems should occur.  Please show the CHEMOTHERAPY ALERT CARD or IMMUNOTHERAPY ALERT CARD at check-in to the Emergency Department and triage nurse.  Should you have questions after your visit or need to cancel or reschedule your appointment, please contact [facility]  Dept: [phone]  and follow the prompts.  Office hours are 8:00 a.m. to 4:30 p.m. Monday - Friday. Please note that voicemails left after 4:00 p.m. may not be returned until the following business day.  We are closed weekends and major holidays. You have access to a nurse at all times for urgent questions. Please call the main number to the clinic Dept: [phone] and follow the prompts.  For any non-urgent questions, you may also contact your provider using MyChart. We now offer e-Visits for anyone 44 and older to request care online for non-urgent symptoms. For details visit [email or web address].   Also download the MyChart app! Go to the app store, search "MyChart", open the app, select [facility], and log in with your  MyChart username and password.

## 2022-03-18 ENCOUNTER — Inpatient Hospital Stay: Payer: No Typology Code available for payment source

## 2022-03-18 VITALS — BP 130/66 | HR 61 | Temp 98.7°F | Resp 18 | Ht 65.5 in | Wt 193.5 lb

## 2022-03-18 DIAGNOSIS — C7951 Secondary malignant neoplasm of bone: Secondary | ICD-10-CM

## 2022-03-18 DIAGNOSIS — Z5111 Encounter for antineoplastic chemotherapy: Secondary | ICD-10-CM | POA: Diagnosis not present

## 2022-03-18 DIAGNOSIS — C61 Malignant neoplasm of prostate: Secondary | ICD-10-CM

## 2022-03-18 MED ORDER — PEGFILGRASTIM-CBQV 6 MG/0.6ML ~~LOC~~ SOSY
6.0000 mg | PREFILLED_SYRINGE | Freq: Once | SUBCUTANEOUS | Status: AC
  Administered 2022-03-18: 6 mg via SUBCUTANEOUS
  Filled 2022-03-18: qty 0.6

## 2022-03-18 NOTE — Patient Instructions (Signed)

## 2022-03-22 ENCOUNTER — Encounter: Payer: Self-pay | Admitting: Oncology

## 2022-03-29 ENCOUNTER — Other Ambulatory Visit: Payer: Self-pay

## 2022-03-29 DIAGNOSIS — C61 Malignant neoplasm of prostate: Secondary | ICD-10-CM

## 2022-03-29 DIAGNOSIS — C7951 Secondary malignant neoplasm of bone: Secondary | ICD-10-CM

## 2022-03-29 MED ORDER — MORPHINE SULFATE ER 15 MG PO TBCR: 15.0000 mg | EXTENDED_RELEASE_TABLET | Freq: Two times a day (BID) | ORAL | 0 refills | Status: DC

## 2022-03-30 ENCOUNTER — Encounter: Payer: Self-pay | Admitting: Oncology

## 2022-04-02 ENCOUNTER — Inpatient Hospital Stay (INDEPENDENT_AMBULATORY_CARE_PROVIDER_SITE_OTHER): Payer: No Typology Code available for payment source | Admitting: Hematology and Oncology

## 2022-04-02 ENCOUNTER — Encounter: Payer: Self-pay | Admitting: Hematology and Oncology

## 2022-04-02 ENCOUNTER — Inpatient Hospital Stay: Payer: No Typology Code available for payment source

## 2022-04-02 ENCOUNTER — Telehealth: Payer: Self-pay | Admitting: Hematology and Oncology

## 2022-04-02 VITALS — BP 114/59 | HR 60 | Temp 98.1°F | Resp 20 | Ht 65.5 in | Wt 193.3 lb

## 2022-04-02 DIAGNOSIS — C7951 Secondary malignant neoplasm of bone: Secondary | ICD-10-CM | POA: Diagnosis not present

## 2022-04-02 DIAGNOSIS — C61 Malignant neoplasm of prostate: Secondary | ICD-10-CM

## 2022-04-02 DIAGNOSIS — Z5111 Encounter for antineoplastic chemotherapy: Secondary | ICD-10-CM | POA: Diagnosis not present

## 2022-04-02 DIAGNOSIS — D72829 Elevated white blood cell count, unspecified: Secondary | ICD-10-CM | POA: Insufficient documentation

## 2022-04-02 DIAGNOSIS — C7952 Secondary malignant neoplasm of bone marrow: Secondary | ICD-10-CM

## 2022-04-02 DIAGNOSIS — D72828 Other elevated white blood cell count: Secondary | ICD-10-CM

## 2022-04-02 DIAGNOSIS — D649 Anemia, unspecified: Secondary | ICD-10-CM | POA: Diagnosis not present

## 2022-04-02 LAB — CBC AND DIFFERENTIAL
HCT: 37 — AB (ref 41–53)
Hemoglobin: 11.7 — AB (ref 13.5–17.5)
MCV: 90 (ref 80–94)
Neutrophils Absolute: 12.74
Platelets: 227 10*3/uL (ref 150–400)
WBC: 18.2

## 2022-04-02 LAB — HEPATIC FUNCTION PANEL
ALT: 28 U/L (ref 10–40)
AST: 34 (ref 14–40)
Alkaline Phosphatase: 101 (ref 25–125)
Bilirubin, Total: 0.4

## 2022-04-02 LAB — BASIC METABOLIC PANEL
BUN: 17 (ref 4–21)
CO2: 28 — AB (ref 13–22)
Chloride: 104 (ref 99–108)
Creatinine: 0.8 (ref 0.6–1.3)
Glucose: 131
Potassium: 3.9 mEq/L (ref 3.5–5.1)
Sodium: 139 (ref 137–147)

## 2022-04-02 LAB — COMPREHENSIVE METABOLIC PANEL
Albumin: 4.1 (ref 3.5–5.0)
Calcium: 9.1 (ref 8.7–10.7)

## 2022-04-02 LAB — CBC: RBC: 4.1 (ref 3.87–5.11)

## 2022-04-02 NOTE — Progress Notes (Signed)
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 He had been on enzalutamide 160 mg daily in 2018, in addition to leuprolide 22.5 mg, and zoledronic acid, which is now being given every 3 months.  He has been on MS Contin 15 mg twice daily with occasional Tylenol for breakthrough with good control of this pain.  The PSA began to steadily rise and was up to 83.49 in November 2022.  PSMA PET in October 2022 revealed an oligometastasis skeletal lesion it in the anterior right 7th rib, with increased activity, and very subtle sclerotic change at T10, but no evidence of progressive disease.The PSA has continued to fluctuate up and down.  It was down to 31 in February 2023, then back up to 73 in May.  It then increased dramatically to 334 in August.  PSMA PET scan revealed severe progression of disease with several new tiny lung nodules, which did have hypermetabolic activity with SUV up to 5.  There was an increase in the bone lesions, which are now innumerable, including major lesions at L3, the right iliac wing, and the left pubic bone.  The PSA increased to 482 in September.  He is receiving palliative chemotherapy with docetaxel every 3 weeks with prednisone 5 mg twice daily and is tolerating this fairly well.  He will proceed with a 6th cycle next week.  He will continue to receive pegfilgrastim with each cycle to prevent complications of prolonged neutropenia. As he will be out of town from February 3 to February 17, we will plan to see him back on February 19 with a CBC, comprehensive metabolic panel and PSA prior to a 7th cycle docetaxel.  Secondary malignant neoplasm of bone and bone marrow ([place]) He  continues zoledronic acid every 3 months in addition to docetaxel/prednisone.  Anemia Chronic anemia, which is stable, likely related to chemotherapy, but also possibly bone metastasis.  Leucocytosis Leukocytosis secondary to pegfilgrastim.  The patient does not have signs or symptoms of infection.    The patient understands the plans discussed today and is in agreement with them.  He knows to contact our office if he develops concerns prior to his next appointment.   I provided 15 minutes of face-to-face time during this encounter and > 50% was spent counseling as documented under my assessment and plan.    Marvia Pickles, PA-C  [facility] AT [facility] [address] [place] [address] Dept: [phone] Dept Fax: [phone]   Orders Placed This Encounter  Procedures   CBC and differential    This external order was created through the Results Console.   CBC    This external order was created through the Results Console.   Basic metabolic panel    This external order was created through the Results Console.   Comprehensive metabolic panel    This external order was created through the Results Console.   Hepatic function panel    This external order was created through the Results Console.      CHIEF COMPLAINT:  CC: Metastatic prostate cancer  Current Treatment: Docetaxel/prednisone  HISTORY OF PRESENT ILLNESS:  Marvin Johnson is a 72 year old male with a history of stage  IV (T3b N1 M0) prostate cancer diagnosed in January 2016 when he was found to have a PSA of 51.8.  He was treated with a laparoscopic prostatectomy in May 2016.  Pathology revealed adenocarcinoma with a Gleason 8.  There was a positive posterior margin with extraprostatic extension, as well as left retro-urethral tissue positive and extension to the seminal vesicles bilaterally with multiple positive margins.  Two lymph nodes from the right side  were negative, but 2/4 lymph nodes from the left side were positive for metastasis. Repeat bone scan in June 2016 revealed a new lesion at T10, which was not present on his baseline bone scan.  PSA at Dr. [name] office in early August 2016 remained elevated at 56.4.   We began seeing him in August 2016. The PSA was up to 216.3 in late August 2016, so hormonal therapy was recommended  X-rays of the thoracic spine revealed osteoarthritis and scoliosis, as well as evidence of demineralization, but we could not visualize a metastatic lesion.[name] He started leuprolide in September 2016.  The PSA initially dropped steadily with leuprolide.  Bone density scan in November 2016 revealed osteopenia, with a T-score of -2.1 in the spine and a T-score of -0.9 in the femur.  He had been taking vitamin-D 1000 international units daily, but not calcium, so calcium 600 mg twice daily was added at that time.    Leuprolide was held in February 2017 because of chest pain.  EKG was unremarkable.  He went to the [place] and he was referred to the [facility] for placement of 2 coronary artery stents for 95% and 99% occlusions.  He then had a third coronary artery stent placed later in the summer.  Leuprolide was resumed in March 2017.  In September 2017, he had an increase in the PSA, so we repeated a bone scan.  This revealed intense uptake within the right posterior elements of T10, but no other areas of metastasis.  We then added bicalutamide 50 mg daily to his leuprolide.     He had a steadily increasing PSA despite the bicalutamide, so this was discontinued in March 2018.  He eventually was placed on MS Contin 15 mg twice daily, with oxycodone 10 mg every 4 hours as needed for breakthrough pain.  MRI thoracic spine in April 2018 revealed increasing tumor at T10 with epidural spread.  Bone scan at that time revealed increased activity in the T10 lesion, which was stable.  There was a questionable tiny focus in the right ischium/acetabular  rim.  Plain x-rays of the bilateral hips and pelvis did not reveal any focal abnormality. The PSA increased from 27.5 in March, to 72.2 in May, then to 92.2 in June 2018.  He was referred for radiotherapy to the T10 lesion due to the severe pain with improvement in his pain. He was started on zoledronic acid along with his leuprolide in June 2018.  He had a Port-A-Cath placed in anticipation of chemotherapy, but then we recommended that he be placed on enzalutamide 160 mg daily instead, in order to delay the time to initiation of chemotherapy.  He started enzalutamide in June 2018.  The PSA became undetectable in October 2018.  We continued MS Contin, but discontinued the oxycodone. He did not tolerate meloxicam, so uses Tylenol as needed for breakthrough pain.  He had a stress test in January 2020 and was referred to [facility] for coronary artery stenting, which was done in February.  Zoledronic acid had  been given monthly for over 2 years, so was changed to every 3 months in October 2020.   The PSA started to increase in January 2021 at which time it was 0.2.  CT chest, abdomen and pelvis in March 2021 not reveal any lymphadenopathy or soft tissue metastatic disease.  Hepatic steatosis was seen.  There was a stable sclerotic osseous lesion of the T10 vertebral body, as well as a subtle sclerotic lesion of the L3 vertebral body, also possibly a small metastatic lesion.  Whole body bone scan did not reveal continued increased activity of T10 and right acetabulum posteriorly.  The PSA was 0.5 in March.  PSA had increased to 3.0 in June.  Axumin PET imaging from July did not reveal any evidence of active prostate carcinoma, local recurrence, or metastatic disease. The PSA continued to slowly rise and was up to 5.9 in September, 10.9 in October, and 16.3 in December.     The PSA went up to 23.81 in March 2022, so an [place] PET scan was ordered which revealed an oligometastasis skeletal lesion in  the anterior right 7th rib with very subtle sclerotic change at this level. PSA in June decreased to 18.6, but then in September increased to 58.  In view of the significant rise in his PSA, a prostate specific PET scan was ordered.  PSMA PET in October 2022 revealed a persistent focus of intense radiotracer activity in the anterior RIGHT seventh rib with increased in activity from comparison exam and subtle sclerotic changes on CT. Findings remain consistent with oligometastatic skeletal metastasis. Sclerotic lesions in the T10 vertebral body without radiotracer activity potentially represented prior treated prostate cancer metastasis.  We recommended he continue his current treatment with enzalutamide, leuprolide and zoledronic acid.  PSA in November continued to increase to 83.49.  Even though his PSA continued to steadily increase, we did not recommend a change in therapy based on this alone.  He had no significant pain or increase in pain and was having a good quality of life.  The PSA was was 31 in February 2023, then 50 in May.  There was a significant increase in the PSA in August to 334.  PSMA PET in September 2023 revealed significant progression of disease with several tiny lung nodules, all 5 mm or less, some of which had hypermetabolic activity with SUV up to 5.  There were increased bone lesions, now innumerable, including major lesions at L3, the right iliac wing, and the left pubic bone. These lesions were not appreciated on on CT scan.  Enzalutamide was discontinued.  He was started on chemotherapy with docetaxel/prednisone in September.  He suffered an non-STEMI in November and had stents placed.  He has done well since.  Treatment was delayed until he recovered from this.     Oncology History  Malignant neoplasm of prostate ([place])  03/30/2014 Cancer Staging   Staging form: Prostate, AJCC 8th Edition - Clinical stage from 03/30/2014: Stage IVA (cT3b, cN1, cM0, PSA: 51.8, Grade Group: 4) -  Signed by [name], MD on 11/14/2020 Histopathologic type: Adenocarcinoma, NOS Stage prefix: Initial diagnosis Prostate specific antigen (PSA) range: 20 or greater Gleason primary pattern: 4 Gleason secondary pattern: 4 Gleason score: 8 Histologic grading system: 5 grade system Location of positive needle core biopsies: Beyond primary site Prognostic indicators: May 2016 Robotic prostatectomy with mult. Pos margins, extra prostatic extension, 2/6 pos nodes. Scan June 2016 positive at T10  PSA up to 216.3 by August Placed on  lupron and casodex Stage used in treatment planning: Yes National guidelines used in treatment planning: Yes Type of national guideline used in treatment planning: NCCN   02/18/2020 Initial Diagnosis   Malignant neoplasm of prostate ([place])   12/01/2021 -  Chemotherapy   Patient is on Treatment Plan : PROSTATE Docetaxel (73) + Prednisone q21d     Secondary malignant neoplasm of bone and bone marrow ([place])  02/18/2020 Initial Diagnosis   Secondary malignant neoplasm of bone and bone marrow ([place])   12/01/2021 -  Chemotherapy   Patient is on Treatment Plan : PROSTATE Docetaxel (73) + Prednisone q21d         INTERVAL HISTORY:  Omero is here today for repeat clinical assessment prior to a 6th cycle of docetaxel.  He states he continues to tolerate this without difficulty.  He has constipation which he manages with senna.  He denies episodes of chest pain or shortness of breath.. He denies fevers or chills. He denies pain. His appetite is good. His weight has been stable.  [place] so we will need to delay his 7th cycle of docetaxel.    REVIEW OF SYSTEMS:  Review of Systems  Constitutional:  Negative for appetite change, chills, fatigue, fever and unexpected weight change.  HENT:   Negative for lump/mass, mouth sores and sore throat.   Respiratory:  Negative for cough and shortness of breath.    Cardiovascular:  Negative for chest pain and leg swelling.  Gastrointestinal:  Negative for abdominal pain, constipation, diarrhea, nausea and vomiting.  Genitourinary:  Negative for difficulty urinating, dysuria, frequency and hematuria.   Musculoskeletal:  Negative for arthralgias, back pain and myalgias.  Skin:  Negative for itching, rash and wound.  Neurological:  Negative for dizziness, extremity weakness, headaches, light-headedness and numbness.  Hematological:  Negative for adenopathy.  Psychiatric/Behavioral:  Negative for depression and sleep disturbance. The patient is not nervous/anxious.      VITALS:  Blood pressure (!) 114/59, pulse 60, temperature 98.1 F (36.7 C), temperature source Oral, resp. rate 20, height 5' 5.5" (1.664 m), weight 193 lb 4.8 oz (87.7 kg), SpO2 99 %.  Wt Readings from Last 3 Encounters:  04/02/22 193 lb 4.8 oz (87.7 kg)  03/18/22 193 lb 8 oz (87.8 kg)  03/16/22 193 lb 12 oz (87.9 kg)    Body mass index is 31.68 kg/m.  Performance status (ECOG): 0 - Asymptomatic  PHYSICAL EXAM:  Physical Exam Vitals and nursing note reviewed.  Constitutional:      General: He is not in acute distress.    Appearance: Normal appearance. He is normal weight.  HENT:     Head: Normocephalic and atraumatic.     Mouth/Throat:     Mouth: Mucous membranes are moist.     Pharynx: Oropharynx is clear. No oropharyngeal exudate or posterior oropharyngeal erythema.  Eyes:     General: No scleral icterus.    Extraocular Movements: Extraocular movements intact.     Conjunctiva/sclera: Conjunctivae normal.     Pupils: Pupils are equal, round, and reactive to light.  Cardiovascular:     Rate and Rhythm: Normal rate and regular rhythm.     Heart sounds: Normal heart sounds. No murmur heard.    No friction rub. No gallop.  Pulmonary:     Effort: Pulmonary effort is normal.     Breath sounds: Normal breath sounds. No wheezing, rhonchi or rales.  Abdominal:  General:  Bowel sounds are normal. There is no distension.     Palpations: Abdomen is soft. There is no hepatomegaly, splenomegaly or mass.     Tenderness: There is no abdominal tenderness.  Musculoskeletal:        General: Normal range of motion.     Cervical back: Normal range of motion and neck supple. No tenderness.     Right lower leg: No edema.     Left lower leg: No edema.  Lymphadenopathy:     Cervical: No cervical adenopathy.     Upper Body:     Right upper body: No supraclavicular or axillary adenopathy.     Left upper body: No supraclavicular or axillary adenopathy.     Lower Body: No right inguinal adenopathy. No left inguinal adenopathy.  Skin:    General: Skin is warm and dry.     Coloration: Skin is not jaundiced.     Findings: No rash.  Neurological:     Mental Status: He is alert and oriented to person, place, and time.     Cranial Nerves: No cranial nerve deficit.  Psychiatric:        Mood and Affect: Mood normal.        Behavior: Behavior normal.        Thought Content: Thought content normal.     LABS:      Latest Ref Rng & Units 04/02/2022   12:00 AM 03/12/2022   12:00 AM 02/19/2022   12:00 AM  CBC  WBC  18.2     11.9     18.2      Hemoglobin 13.5 - 17.5 11.7     11.7     11.0      Hematocrit 41 - 53 37     36     34      Platelets 150 - 400 K/uL 227     238     285         This result is from an external source.      Latest Ref Rng & Units 04/02/2022   12:00 AM 03/12/2022    8:55 AM 02/19/2022   10:09 AM  CMP  Glucose 70 - 99 mg/dL  85  156   BUN 4 - '21 17     14  18   '$ Creatinine 0.6 - 1.3 0.8     0.89  0.98   Sodium 137 - 147 139     141  140   Potassium 3.5 - 5.1 mEq/L 3.9     3.8  3.8   Chloride 99 - 108 104     108  104   CO2 13 - '22 28     26  25   '$ Calcium 8.7 - 10.7 9.1     9.0  9.1   Total Protein 6.5 - 8.1 g/dL  7.7  6.8   Total Bilirubin 0.3 - 1.2 mg/dL  0.4  0.5   Alkaline Phos 25 - 125 101     62  73   AST 14 - 40 34     22  18   ALT 10  - 40 U/L '28     21  20      '$ This result is from an external source.     No results found for: "CEA1", "CEA" / No results found for: "CEA1", "CEA" Lab Results  Component Value Date   PSA1 334.0 (H) 10/30/2021   No results  found for: "CAN199" No results found for: "CAN125"  No results found for: "TOTALPROTELP", "ALBUMINELP", "A1GS", "A2GS", "BETS", "BETA2SER", "GAMS", "MSPIKE", "SPEI" Lab Results  Component Value Date   TIBC 398 01/29/2022   FERRITIN 206 01/29/2022   IRONPCTSAT 25 01/29/2022   No results found for: "LDH"  STUDIES:  No results found.    HISTORY:   Past Medical History:  Diagnosis Date   Hyperlipidemia    Malignant neoplasm of prostate ([place])    Supraventricular tachycardia     Past Surgical History:  Procedure Laterality Date   CORONARY STENT INTERVENTION N/A 02/08/2022   Procedure: CORONARY STENT INTERVENTION;  Surgeon: Nelva Bush, MD;  Location: [place] [facility];  Service: Cardiovascular;  Laterality: N/A;   LEFT HEART CATH AND CORONARY ANGIOGRAPHY N/A 02/08/2022   Procedure: LEFT HEART CATH AND CORONARY ANGIOGRAPHY;  Surgeon: Nelva Bush, MD;  Location: [place] [facility];  Service: Cardiovascular;  Laterality: N/A;   PROSTATECTOMY  07/2014   TONSILLECTOMY      Family History  Problem Relation Age of Onset   Breast cancer Mother 60   Ovarian cancer Mother 20   Breast cancer Paternal Grandmother 42    Social History:  reports that he has never smoked. He has never used smokeless tobacco. He reports that he does not currently use alcohol. He reports that he does not use drugs.The patient is alone today.  Allergies:  Allergies  Allergen Reactions   Atorvastatin    Rosuvastatin Other (See Comments)    Other reaction(s): Muscle pain   Simvastatin Other (See Comments)    Other reaction(s): Joint pain, Muscle pain   Sulfa Antibiotics    Sulfamethoxazole-Trimethoprim Other (See Comments)    Unknown as to interaction was a baby.     Current Medications: Current Outpatient Medications  Medication Sig Dispense Refill   diphenhydrAMINE (BENADRYL) 50 MG capsule Take 50 mg by mouth at bedtime as needed.     Alirocumab (PRALUENT [place]) Inject into the skin. One injection every 2 weeks Monday     aspirin 81 MG EC tablet Take 81 mg by mouth daily.     Cholecalciferol 25 MCG (1000 UT) tablet Take 1,000 Units by mouth in the morning and at bedtime.     clopidogrel (PLAVIX) 75 MG tablet Take 75 mg by mouth daily.     dexamethasone (DECADRON) 4 MG tablet Take 8 mg by mouth See admin instructions. Take two tablets by mouth twice a day (start the day before chemo, then take daily for 2 days starting te day after chemo; take with food)     ezetimibe (ZETIA) 10 MG tablet Take 10 mg by mouth daily.     isosorbide mononitrate (IMDUR) 30 MG 24 hr tablet Take 1 tablet by mouth every 8 (eight) hours as needed.     leuprolide (LUPRON) 22.5 MG injection Inject 22.5 mg into the muscle every 3 (three) months.     metoprolol tartrate (LOPRESSOR) 25 MG tablet Take 12.5 mg by mouth 2 (two) times daily.     morphine (MS CONTIN) 15 MG 12 hr tablet Take 1 tablet (15 mg total) by mouth every 12 (twelve) hours. 60 tablet 0   Multiple Vitamins-Minerals (MULTIVITAMIN WITH MINERALS) tablet Take 1 tablet by mouth daily.     nitroGLYCERIN (NITROSTAT) 0.4 MG SL tablet Place 0.4 mg under the tongue every 5 (five) minutes as needed for chest pain. Repeat every 5 minutes if needed for a total of 3 tablets in 15 minutes. If  no relief, CALL 911.     Omega-3 Fatty Acids (FISH OIL) 1000 MG CAPS Take 2,000 mg by mouth in the morning and at bedtime.     predniSONE (DELTASONE) 5 MG tablet Take 1 tablet (5 mg total) by mouth in the morning and at bedtime. 60 tablet 11   Royal Jelly-Bee Pollen-Ginseng (KOREAN GINSENG COMPLEX) 150-250-50 MG CAPS Take 1 capsule by mouth daily.     senna (SENOKOT) 8.6 MG TABS tablet Take 4 tablets by mouth 2 (two) times daily as needed for  moderate constipation.     Zoledronic Acid (ZOMETA) 4 MG/100ML IVPB Inject 4 mg into the vein every 3 (three) months.     No current facility-administered medications for this visit.

## 2022-04-02 NOTE — Assessment & Plan Note (Signed)
He continues zoledronic acid every 3 months in addition to docetaxel/prednisone.

## 2022-04-02 NOTE — Assessment & Plan Note (Addendum)
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 He had been on enzalutamide 160 mg daily in 2018, in addition to leuprolide 22.5 mg, and zoledronic acid, which is now being given every 3 months.  He has been on MS Contin 15 mg twice daily with occasional Tylenol for breakthrough with good control of this pain.  The PSA began to steadily rise and was up to 83.49 in November 2022.  PSMA PET in October 2022 revealed an oligometastasis skeletal lesion it in the anterior right 7th rib, with increased activity, and very subtle sclerotic change at T10, but no evidence of progressive disease.The PSA has continued to fluctuate up and down.  It was down to 31 in February 2023, then back up to 23 in May.  It then increased dramatically to 334 in August.  PSMA PET scan revealed severe progression of disease with several new tiny lung nodules, which did have hypermetabolic activity with SUV up to 5.  There was an increase in the bone lesions, which are now innumerable, including major lesions at L3, the right iliac wing, and the left pubic bone.  The PSA increased to 482 in September.  He is receiving palliative chemotherapy with docetaxel every 3 weeks with prednisone 5 mg twice daily and is tolerating this fairly well.  He will proceed with a 6th cycle next week.  He will continue to receive pegfilgrastim with each cycle to prevent complications of prolonged neutropenia. As he will be out of town from February 3 to February 17, we will plan to see him back on February 19 with a CBC, comprehensive metabolic panel and PSA prior to a 7th cycle docetaxel.

## 2022-04-02 NOTE — Telephone Encounter (Signed)
04/02/22 Next appt moved out 2weeks Per Vida Roller.Patient aware

## 2022-04-02 NOTE — Assessment & Plan Note (Signed)
Chronic anemia, which is stable, likely related to chemotherapy, but also possibly bone metastasis.

## 2022-04-02 NOTE — Assessment & Plan Note (Signed)
Leukocytosis secondary to pegfilgrastim.  The patient does not have signs or symptoms of infection.

## 2022-04-03 LAB — PSA: Prostatic Specific Antigen: 406 ng/mL — ABNORMAL HIGH (ref 0.00–4.00)

## 2022-04-04 ENCOUNTER — Encounter: Payer: Self-pay | Admitting: Oncology

## 2022-04-05 ENCOUNTER — Telehealth: Payer: Self-pay

## 2022-04-05 NOTE — Telephone Encounter (Signed)
-----  Message from [name], MD sent at 04/04/2022  7:28 PM EST ----- Regarding: call Tell him PSA went back up to 406 but I rec we keep going and see what it does

## 2022-04-05 NOTE — Telephone Encounter (Signed)
Patient notified and is fine with continuing

## 2022-04-06 ENCOUNTER — Inpatient Hospital Stay: Payer: No Typology Code available for payment source

## 2022-04-06 VITALS — BP 134/71 | HR 63 | Temp 97.8°F | Resp 18 | Ht 65.5 in | Wt 195.0 lb

## 2022-04-06 DIAGNOSIS — C61 Malignant neoplasm of prostate: Secondary | ICD-10-CM

## 2022-04-06 DIAGNOSIS — Z5111 Encounter for antineoplastic chemotherapy: Secondary | ICD-10-CM | POA: Diagnosis not present

## 2022-04-06 DIAGNOSIS — C7951 Secondary malignant neoplasm of bone: Secondary | ICD-10-CM

## 2022-04-06 MED ORDER — SODIUM CHLORIDE 0.9 % IV SOLN
10.0000 mg | Freq: Once | INTRAVENOUS | Status: AC
  Administered 2022-04-06: 10 mg via INTRAVENOUS
  Filled 2022-04-06: qty 10

## 2022-04-06 MED ORDER — SODIUM CHLORIDE 0.9% FLUSH
10.0000 mL | INTRAVENOUS | Status: DC | PRN
  Administered 2022-04-06 (×2): 10 mL

## 2022-04-06 MED ORDER — HEPARIN SOD (PORK) LOCK FLUSH 100 UNIT/ML IV SOLN
500.0000 [IU] | Freq: Once | INTRAVENOUS | Status: AC | PRN
  Administered 2022-04-06: 500 [IU]

## 2022-04-06 MED ORDER — SODIUM CHLORIDE 0.9 % IV SOLN: Freq: Once | INTRAVENOUS | Status: AC

## 2022-04-06 MED ORDER — SODIUM CHLORIDE 0.9 % IV SOLN
60.0000 mg/m2 | Freq: Once | INTRAVENOUS | Status: AC
  Administered 2022-04-06: 120 mg via INTRAVENOUS
  Filled 2022-04-06: qty 12

## 2022-04-06 NOTE — Patient Instructions (Signed)
Docetaxel Injection What is this medication? DOCETAXEL (doe se TAX el) treats some types of cancer. It works by slowing down the growth of cancer cells. This medicine may be used for other purposes; ask your health care provider or pharmacist if you have questions. COMMON BRAND NAME(S): Docefrez, Taxotere What should I tell my care team before I take this medication? They need to know if you have any of these conditions: Kidney disease Liver disease Low white blood cell levels Tingling of the fingers or toes or other nerve disorder An unusual or allergic reaction to docetaxel, polysorbate 80, other medications, foods, dyes, or preservatives Pregnant or trying to get pregnant Breast-feeding How should I use this medication? This medication is injected into a vein. It is given by your care team in a hospital or clinic setting. Talk to your care team about the use of this medication in children. Special care may be needed. Overdosage: If you think you have taken too much of this medicine contact a poison control center or emergency room at once. NOTE: This medicine is only for you. Do not share this medicine with others. What if I miss a dose? Keep appointments for follow-up doses. It is important not to miss your dose. Call your care team if you are unable to keep an appointment. What may interact with this medication? Do not take this medication with any of the following: Live virus vaccines This medication may also interact with the following: Certain antibiotics, such as clarithromycin, telithromycin Certain antivirals for HIV or hepatitis Certain medications for fungal infections, such as itraconazole, ketoconazole, voriconazole Grapefruit juice Nefazodone Supplements, such as St. John's wort This list may not describe all possible interactions. Give your health care provider a list of all the medicines, herbs, non-prescription drugs, or dietary supplements you use. Also tell them if  you smoke, drink alcohol, or use illegal drugs. Some items may interact with your medicine. What should I watch for while using this medication? This medication may make you feel generally unwell. This is not uncommon as chemotherapy can affect healthy cells as well as cancer cells. Report any side effects. Continue your course of treatment even though you feel ill unless your care team tells you to stop. You may need blood work done while you are taking this medication. This medication can cause serious side effects and infusion reactions. To reduce the risk, your care team may give you other medications to take before receiving this one. Be sure to follow the directions from your care team. This medication may increase your risk of getting an infection. Call your care team for advice if you get a fever, chills, sore throat, or other symptoms of a cold or flu. Do not treat yourself. Try to avoid being around people who are sick. Avoid taking medications that contain aspirin, acetaminophen, ibuprofen, naproxen, or ketoprofen unless instructed by your care team. These medications may hide a fever. Be careful brushing or flossing your teeth or using a toothpick because you may get an infection or bleed more easily. If you have any dental work done, tell your dentist you are receiving this medication. Some products may contain alcohol. Ask your care team if this medication contains alcohol. Be sure to tell all care teams you are taking this medicine. Certain medications, like metronidazole and disulfiram, can cause an unpleasant reaction when taken with alcohol. The reaction includes flushing, headache, nausea, vomiting, sweating, and increased thirst. The reaction can last from 30 minutes to several hours.  This medication may affect your coordination, reaction time, or judgement. Do not drive or operate machinery until you know how this medication affects you. Sit up or stand slowly to reduce the risk of  dizzy or fainting spells. Drinking alcohol with this medication can increase the risk of these side effects. Talk to your care team about your risk of cancer. You may be more at risk for certain types of cancer if you take this medication. Talk to your care team if you wish to become pregnant or think you might be pregnant. This medication can cause serious birth defects if taken during pregnancy or if you get pregnant within 2 months after stopping therapy. A negative pregnancy test is required before starting this medication. A reliable form of contraception is recommended while taking this medication and for 2 months after stopping it. Talk to your care team about reliable forms of contraception. Do not breast-feed while taking this medication and for 1 week after stopping therapy. Use a condom during sex and for 4 months after stopping therapy. Tell your care team right away if you think your partner might be pregnant. This medication can cause serious birth defects. This medication may cause infertility. Talk to your care team if you are concerned about your fertility. What side effects may I notice from receiving this medication? Side effects that you should report to your care team as soon as possible: Allergic reactions--skin rash, itching, hives, swelling of the face, lips, tongue, or throat Change in vision such as blurry vision, seeing halos around lights, vision loss Infection--fever, chills, cough, or sore throat Infusion reactions--chest pain, shortness of breath or trouble breathing, feeling faint or lightheaded Low red blood cell level--unusual weakness or fatigue, dizziness, headache, trouble breathing Pain, tingling, or numbness in the hands or feet Painful swelling, warmth, or redness of the skin, blisters or sores at the infusion site Redness, blistering, peeling, or loosening of the skin, including inside the mouth Sudden or severe stomach pain, bloody diarrhea, fever, nausea,  vomiting Swelling of the ankles, hands, or feet Tumor lysis syndrome (TLS)--nausea, vomiting, diarrhea, decrease in the amount of urine, dark urine, unusual weakness or fatigue, confusion, muscle pain or cramps, fast or irregular heartbeat, joint pain Unusual bruising or bleeding Side effects that usually do not require medical attention (report to your care team if they continue or are bothersome): Change in nail shape, thickness, or color Change in taste Hair loss Increased tears This list may not describe all possible side effects. Call your doctor for medical advice about side effects. You may report side effects to FDA at 1-800-FDA-1088. Where should I keep my medication? This medication is given in a hospital or clinic. It will not be stored at home. NOTE: This sheet is a summary. It may not cover all possible information. If you have questions about this medicine, talk to your doctor, pharmacist, or health care provider.  2023 Elsevier/Gold Standard (2007-04-22 00:00:00)

## 2022-04-08 ENCOUNTER — Inpatient Hospital Stay: Payer: No Typology Code available for payment source

## 2022-04-08 VITALS — BP 137/62 | HR 78 | Temp 98.2°F | Resp 18 | Wt 196.1 lb

## 2022-04-08 DIAGNOSIS — C61 Malignant neoplasm of prostate: Secondary | ICD-10-CM

## 2022-04-08 DIAGNOSIS — C7951 Secondary malignant neoplasm of bone: Secondary | ICD-10-CM

## 2022-04-08 DIAGNOSIS — Z5111 Encounter for antineoplastic chemotherapy: Secondary | ICD-10-CM | POA: Diagnosis not present

## 2022-04-08 MED ORDER — PEGFILGRASTIM-CBQV 6 MG/0.6ML ~~LOC~~ SOSY
6.0000 mg | PREFILLED_SYRINGE | Freq: Once | SUBCUTANEOUS | Status: AC
  Administered 2022-04-08: 6 mg via SUBCUTANEOUS
  Filled 2022-04-08: qty 0.6

## 2022-04-08 NOTE — Patient Instructions (Signed)

## 2022-04-23 ENCOUNTER — Other Ambulatory Visit: Payer: Self-pay

## 2022-04-23 DIAGNOSIS — C7951 Secondary malignant neoplasm of bone: Secondary | ICD-10-CM

## 2022-04-23 DIAGNOSIS — C61 Malignant neoplasm of prostate: Secondary | ICD-10-CM

## 2022-04-23 MED ORDER — MORPHINE SULFATE ER 15 MG PO TBCR: 15.0000 mg | EXTENDED_RELEASE_TABLET | Freq: Two times a day (BID) | ORAL | 0 refills | Status: DC

## 2022-04-26 ENCOUNTER — Encounter: Payer: Self-pay | Admitting: Oncology

## 2022-04-26 ENCOUNTER — Other Ambulatory Visit: Payer: Self-pay

## 2022-04-26 DIAGNOSIS — C7951 Secondary malignant neoplasm of bone: Secondary | ICD-10-CM

## 2022-04-26 DIAGNOSIS — M898X9 Other specified disorders of bone, unspecified site: Secondary | ICD-10-CM

## 2022-04-26 DIAGNOSIS — C61 Malignant neoplasm of prostate: Secondary | ICD-10-CM

## 2022-04-26 MED ORDER — MORPHINE SULFATE ER 15 MG PO TBCR: 15.0000 mg | EXTENDED_RELEASE_TABLET | Freq: Two times a day (BID) | ORAL | 0 refills | Status: DC

## 2022-04-30 NOTE — Progress Notes (Signed)
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 He has been on Enzalutamide 160 mg daily, as well as leuprolide 22.5 mg, and zoledronic acid every 3 months.  His pain is controlled with MS Contin 15 mg twice daily with occasional Tylenol for breakthrough.   He was on Enzalutamide since June 2018.  He then had a steadily rising PSA, up to 83.49 in November 2022.  PSMA PET in October 2022 revealed an oligometastasis skeletal lesion it in the anterior right 7th rib, with increased activity, and very subtle sclerotic change at T10, but no evidence of progressive disease.The PSA has continued to fluctuate up and down.  It was down to 31 in February of 2023, then back up to 79 in May.  It then increased dramatically to 334 in August and he was scheduled for a PSMA PET scan.  This revealed severe progression of disease with several tiny lung nodules which did have hypermetabolic activity, with SUV up to 5.  He had increased bone lesions which are now innumerable, including major lesions at L3, the right iliac wing, and the left pubic bone. These were not visible on CT scan. He stopped the Enzalutamide and started the Taxotere chemotherapy in September 2023 and is tolerating this well. His PSA went down from 536 to 400.75 after 3 cycles and went down to 284 with the 5th cycle, but then back up to 406 with his 6th cycle.    Secondary malignant neoplasm of bone and bone marrow ([place]) He continues zoledronic acid every 3 months in addition to his cancer therapy. He last received this on February 11, 2022.  He now has marked progression of his bone metastases but minimal symptoms.  Anemia. This is steadily improving, so may have been more  related to his bone metastases rather than chemotherapy. Hemoglobin is up from 11.3 to 12.3.  Leukocytosis This was due to the Neulasta injection and his white count 11.9 today.    Plan:  He was recently hospitalized overnight on 04/17/22 for severe nausea and vomiting and treated symptomatically.  He had a dramatic leukocytosis but this was more likely from his Neulasta injection and cultures were negative.  At discharge they recommended he start miraLAX daily.  His WBC today is at 12.9, hemoglobin at 12.3, HCT at 38. Over the past 3 months, his PSA went from 403.39 down to 284.59 and is now up again at 406.00 and this concerns me. His CMP is unremarkable. We discussed different treatment options and injectable medication such as Xofigo if he has become resistant to the Taxotere. He will get his treatment on 05/05/2022 and Neulasta on 05/07/2022. We may be stopping this medicine after that dose depending on today's PSA. I will see him back in 3 weeks with CBC, CMP, and PSA.  The patient understands the plans discussed today and is in agreement with them.  He knows to contact our office if he develops concerns prior to his next appointment.   His questions were answered.   I provided 25 minutes of face-to-face time during this encounter and > 50% was spent counseling as documented under my assessment and plan.    ADDENDUM: His PSA came back down from 406 to 235 so we  will continue the Taxotere for now since he has little toxicity.   [name], MD  [facility] [address] [place] [address] Dept: [phone] Dept Fax: [phone]   Orders Placed This Encounter  Procedures   CBC and differential    This external order was created through the Results Console.   CBC    This external order was created through the Results Console.   Basic metabolic panel    This external order was created through the Results  Console.   Comprehensive metabolic panel    This external order was created through the Results Console.   Hepatic function panel    This external order was created through the Results Console.   I have reviewed this report as typed by the medical scribe, and it is complete and accurate.  [name]   05/17/22  5:52 PM     CHIEF COMPLAINT:  CC: Metastatic prostate cancer to bone, progressive  Current Treatment: Leuprolide/zoledronic acid and docetaxel  HISTORY OF PRESENT ILLNESS:  Marvin Johnson is a 72 year old male with a history of stage IV (T3b N1 M0) prostate cancer diagnosed in January 2016 when he was found to have a PSA of 51.8.  He was treated with a laparoscopic prostatectomy in May 2016.  Pathology revealed adenocarcinoma with a Gleason 8.  There was a positive posterior margin with extraprostatic extension, as well as left retro-urethral tissue positive and extension to the seminal vesicles bilaterally with multiple positive margins.  Two lymph nodes from the right side were negative, but 2/4 lymph nodes from the left side were positive for metastasis. Repeat bone scan in June 2016 revealed a new lesion at T10, which was not present on his baseline bone scan.  PSA at Dr. [name] office in early August 2016 remained elevated at 56.4.   We began seeing him in August 2016. The PSA was up to 216.3 in late August 2016, so hormonal therapy was recommended  X-rays of the thoracic spine revealed osteoarthritis and scoliosis, as well as evidence of demineralization, but we could not visualize a metastatic lesion.[name] He started leuprolide in September 2016.  The PSA initially dropped steadily with leuprolide.  Bone density scan in November 2016 revealed osteopenia, with a T-score of -2.1 in the spine and a T-score of -0.9 in the femur.  He had been taking vitamin-D 1000 international units daily, but not calcium, so calcium 600 mg twice daily was added at that time.    Leuprolide  was held in February 2017 because of chest pain.  EKG was unremarkable.  He went to the [place] and he was referred to the [facility] for placement of 2 coronary artery stents for 95% and 99% occlusions.  He then had a third coronary artery stent placed later in the summer.  Leuprolide was resumed in March 2017.  In September 2017, he had an increase in the PSA, so we repeated a bone scan.  This revealed intense uptake within the right posterior elements of T10, but no other areas of metastasis.  We then added bicalutamide 50 mg daily to his leuprolide.     He had a steadily increasing PSA despite the bicalutamide, so this was discontinued in March 2018.  He eventually was placed on MS Contin 15 mg twice daily, with oxycodone 10 mg every 4 hours as needed for breakthrough pain.  MRI thoracic spine in April 2018 revealed increasing tumor  at T10 with epidural spread.  Bone scan at that time revealed increased activity in the T10 lesion, which was stable.  There was a questionable tiny focus in the right ischium/acetabular rim.  Plain x-rays of the bilateral hips and pelvis did not reveal any focal abnormality. The PSA increased from 27.5 in March, to 72.2 in May, then to 92.2 in June 2018.  He was referred for radiotherapy to the T10 lesion due to the severe pain with improvement in his pain. He was started on zoledronic acid along with his leuprolide in June 2018.  He had a Port-A-Cath placed in anticipation of chemotherapy, but then we recommended that he be placed on enzalutamide 160 mg daily instead, in order to delay the time to initiation of chemotherapy.  He started enzalutamide in June 2018.  The PSA became undetectable in October 2018.  We continued MS Contin, but discontinued the oxycodone. He did not tolerate meloxicam, so uses Tylenol as needed for breakthrough pain.     He had a stress test in January 2020 and was referred to [facility] for coronary artery stenting, which was done in  February.  Zoledronic acid had been given monthly for over 2 years, so was changed to every 3 months in October 2020.   The PSA started to increase in January 2021 at which time it was 0.2.  CT chest, abdomen and pelvis in March 2021 not reveal any lymphadenopathy or soft tissue metastatic disease.  Hepatic steatosis was seen.  There was a stable sclerotic osseous lesion of the T10 vertebral body, as well as a subtle sclerotic lesion of the L3 vertebral body, also possibly a small metastatic lesion.  Whole body bone scan did not reveal continued increased activity of T10 and right acetabulum posteriorly.  The PSA was 0.5 in March.  PSA had increased to 3.0 in June.  Axumin PET imaging from July did not reveal any evidence of active prostate carcinoma, local recurrence, or metastatic disease. The PSA continued to slowly rise and was up to 5.9 in September, 10.9 in October, and 16.3 in December.     The PSA went up to 23.81 in March 2022, so an [place] PET scan was ordered which revealed an oligometastasis skeletal lesion in the anterior right 7th rib with very subtle sclerotic change at this level. PSA in June decreased to 18.6, but then in September increased to 58.  In view of the significant rise in his PSA, a prostate specific PET scan was ordered.  PSMA PET in October 2022 revealed a persistent focus of intense radiotracer activity in the anterior RIGHT seventh rib with increased in activity from comparison exam and subtle sclerotic changes on CT. Findings remain consistent with oligometastatic skeletal metastasis. Sclerotic lesions in the T10 vertebral body without radiotracer activity potentially represented prior treated prostate cancer metastasis.  We recommended he continue his current treatment with enzalutamide, leuprolide and zoledronic acid.  PSA in November continued to increase to 83.49.  Even though his PSA continued to steadily increase, we did not recommend a change in therapy based on this  alone.  He had no severe pain and no increase in pain and was having a good quality of life.  The PSA has continued to fluctuate up and down and was 31 in February and 78 in May.   Oncology History  Malignant neoplasm of prostate ([place])  03/30/2014 Cancer Staging   Staging form: Prostate, AJCC 8th Edition - Clinical stage from 03/30/2014: Stage  IVA (cT3b, cN1, cM0, PSA: 51.8, Grade Group: 4) - Signed by [name], MD on 11/14/2020 Histopathologic type: Adenocarcinoma, NOS Stage prefix: Initial diagnosis Prostate specific antigen (PSA) range: 20 or greater Gleason primary pattern: 4 Gleason secondary pattern: 4 Gleason score: 8 Histologic grading system: 5 grade system Location of positive needle core biopsies: Beyond primary site Prognostic indicators: May 2016 Robotic prostatectomy with mult. Pos margins, extra prostatic extension, 2/6 pos nodes. Scan June 2016 positive at T10  PSA up to 216.3 by August Placed on lupron and casodex Stage used in treatment planning: Yes National guidelines used in treatment planning: Yes Type of national guideline used in treatment planning: NCCN   02/18/2020 Initial Diagnosis   Malignant neoplasm of prostate ([place])   12/01/2021 -  Chemotherapy   Patient is on Treatment Plan : PROSTATE Docetaxel (73) + Prednisone q21d     Secondary malignant neoplasm of bone and bone marrow ([place])  02/18/2020 Initial Diagnosis   Secondary malignant neoplasm of bone and bone marrow ([place])   12/01/2021 -  Chemotherapy   Patient is on Treatment Plan : PROSTATE Docetaxel (75) + Prednisone q21d         INTERVAL HISTORY:  Adean is here today for repeat clinical assessment for progressive metastatic prostate cancer to bone. On 04/17/2022 he was admitted into the ER for diarrhea and vomiting, his potassium was low at 3.1 and they gave him IV fluids and potassium that rose to 4.2 by discharge. His COVID, RSV, and influenza tests were negative. His chest x-ray looked  normal and CT scan showed colitis and new nodules in the lung measuring 86m. I believe it to be an infection rather than malignancy but we will follow-up with repeat scans later this year. He has fatty liver. His WBC was at 36.8 with a left shift, hemoglobin was at 11.3 and the WBC was still elevated at 35 by discharge. At discharge they recommended he start miraLAX daily.  His WBC today is at 12.9, hemoglobin at 12.3, HCT at 38. Over the past 3 months, his PSA went from 403.39 down to 284.59 and is now up again at 406.00 and this concerns me. His CMP is unremarkable. We discussed different treatment options and injectable medication such as Xofigo if he has become resistant to the Taxotere. He will get his treatment on 05/05/2022 and Neulasta on 05/07/2022. We may be stopping this medicine after that dose depending on today's PSA. I will see him back in 3 weeks with CBC, CMP, and PSA. He denies signs of infection such as sore throat, sinus drainage, cough, or urinary symptoms.  He denies fevers or recurrent chills. He denies pain. He denies nausea, vomiting, chest pain, dyspnea or cough. His appetite is good and his weight has decreased 2 pounds over last month .  REVIEW OF SYSTEMS:  Review of Systems  Constitutional: Negative.  Negative for appetite change, chills, diaphoresis, fatigue, fever and unexpected weight change.  HENT:  Negative.  Negative for hearing loss, lump/mass, mouth sores, nosebleeds, sore throat, tinnitus, trouble swallowing and voice change.   Eyes: Negative.  Negative for eye problems and icterus.  Respiratory: Negative.  Negative for chest tightness, cough, hemoptysis, shortness of breath and wheezing.   Cardiovascular: Negative.  Negative for chest pain, leg swelling and palpitations.  Gastrointestinal: Negative.  Negative for abdominal distention, abdominal pain, blood in stool, constipation, diarrhea, nausea, rectal pain and vomiting.  Endocrine: Negative.  Negative for hot  flashes.  Genitourinary: Negative.  Negative  for bladder incontinence, difficulty urinating, dyspareunia, dysuria, frequency, hematuria, nocturia, pelvic pain and penile discharge.   Musculoskeletal:  Positive for arthralgias. Negative for back pain, flank pain, gait problem, myalgias, neck pain and neck stiffness.  Skin: Negative.  Negative for itching, rash and wound.  Neurological: Negative.  Negative for dizziness, extremity weakness, gait problem, headaches, light-headedness, numbness, seizures and speech difficulty.  Hematological: Negative.  Negative for adenopathy. Does not bruise/bleed easily.  Psychiatric/Behavioral: Negative.  Negative for confusion, decreased concentration, depression, sleep disturbance and suicidal ideas. The patient is not nervous/anxious.      VITALS:  Blood pressure (!) 143/77, pulse 83, temperature (!) 97.5 F (36.4 C), temperature source Oral, resp. rate 18, height 5' 5.5" (1.664 m), weight 193 lb (87.5 kg), SpO2 98 %.  Wt Readings from Last 3 Encounters:  05/07/22 195 lb 8 oz (88.7 kg)  05/05/22 193 lb 4 oz (87.7 kg)  05/03/22 193 lb (87.5 kg)    Body mass index is 31.63 kg/m.  Performance status (ECOG): 1 - Symptomatic but completely ambulatory  PHYSICAL EXAM:  Physical Exam Vitals and nursing note reviewed.  Constitutional:      General: He is not in acute distress.    Appearance: Normal appearance. He is normal weight. He is not ill-appearing, toxic-appearing or diaphoretic.  HENT:     Head: Normocephalic and atraumatic.     Right Ear: Tympanic membrane, ear canal and external ear normal. There is no impacted cerumen.     Left Ear: Tympanic membrane, ear canal and external ear normal. There is no impacted cerumen.     Nose: Nose normal. No congestion or rhinorrhea.     Mouth/Throat:     Mouth: Mucous membranes are moist.     Pharynx: Oropharynx is clear. No oropharyngeal exudate or posterior oropharyngeal erythema.  Eyes:     General: No  scleral icterus.       Left eye: No discharge.     Extraocular Movements: Extraocular movements intact.     Conjunctiva/sclera: Conjunctivae normal.     Pupils: Pupils are equal, round, and reactive to light.  Neck:     Vascular: No carotid bruit.  Cardiovascular:     Rate and Rhythm: Normal rate and regular rhythm.     Pulses: Normal pulses.     Heart sounds: Normal heart sounds. No murmur heard.    No friction rub. No gallop.  Pulmonary:     Effort: Pulmonary effort is normal. No respiratory distress.     Breath sounds: Normal breath sounds. No stridor. No wheezing, rhonchi or rales.  Chest:     Chest wall: No tenderness.  Abdominal:     General: Bowel sounds are normal. There is no distension.     Palpations: Abdomen is soft. There is no hepatomegaly, splenomegaly or mass.     Tenderness: There is no abdominal tenderness. There is no right CVA tenderness, left CVA tenderness, guarding or rebound.     Hernia: No hernia is present.  Musculoskeletal:        General: No swelling, tenderness, deformity or signs of injury. Normal range of motion.     Cervical back: Normal range of motion and neck supple. No rigidity or tenderness.     Right lower leg: No edema.     Left lower leg: No edema.  Lymphadenopathy:     Cervical: No cervical adenopathy.     Upper Body:     Right upper body: No supraclavicular or axillary adenopathy.  Left upper body: No supraclavicular or axillary adenopathy.     Lower Body: No right inguinal adenopathy. No left inguinal adenopathy.  Skin:    General: Skin is warm and dry.     Coloration: Skin is not jaundiced or pale.     Findings: No bruising, erythema, lesion or rash.  Neurological:     Mental Status: He is alert and oriented to person, place, and time.     Cranial Nerves: No cranial nerve deficit.     Sensory: No sensory deficit.     Motor: No weakness.     Coordination: Coordination normal.     Gait: Gait normal.     Deep Tendon Reflexes:  Reflexes normal.  Psychiatric:        Mood and Affect: Mood normal.        Behavior: Behavior normal.        Thought Content: Thought content normal.        Judgment: Judgment normal.    LABS:       Latest Ref Rng & Units 05/03/2022   12:00 AM 04/02/2022   12:00 AM 03/12/2022   12:00 AM  CBC  WBC  12.9     18.2     11.9      Hemoglobin 13.5 - 17.5 12.3     11.7     11.7      Hematocrit 41 - 53 38     37     36      Platelets 150 - 400 K/uL 297     227     238         This result is from an external source.      Latest Ref Rng & Units 05/03/2022   12:00 AM 04/02/2022   12:00 AM 03/12/2022    8:55 AM  CMP  Glucose 70 - 99 mg/dL   85   BUN 4 - '21 16     17     14   '$ Creatinine 0.6 - 1.3 0.7     0.8     0.89   Sodium 137 - 147 139     139     141   Potassium 3.5 - 5.1 mEq/L 3.5     3.9     3.8   Chloride 99 - 108 103     104     108   CO2 13 - '22 28     28     26   '$ Calcium 8.7 - 10.7 9.3     9.1     9.0   Total Protein 6.5 - 8.1 g/dL   7.7   Total Bilirubin 0.3 - 1.2 mg/dL   0.4   Alkaline Phos 25 - 125 57     101     62   AST 14 - 40 29     34     22   ALT 10 - 40 U/L '28     28     21      '$ This result is from an external source.   Component Ref Range & Units 4 wk ago (04/02/22) 1 mo ago (03/12/22) 2 mo ago (02/19/22) 3 mo ago (01/29/22) 3 mo ago (01/08/22)  Prostatic Specific Antigen 0.00 - 4.00 ng/mL 406.00 High  284.59 High  CM 403.39 High  CM 400.75 High  CM 536.11 High  CM     No results found for: "CEA1", "CEA" / No  results found for: "CEA1", "CEA" Lab Results  Component Value Date   PSA1 334.0 (H) 10/30/2021    No results found for: "[id number]" No results found for: "CAN125"  No results found for: "TOTALPROTELP", "ALBUMINELP", "A1GS", "A2GS", "BETS", "BETA2SER", "GAMS", "MSPIKE", "SPEI" Lab Results  Component Value Date   TIBC 398 01/29/2022   FERRITIN 206 01/29/2022   IRONPCTSAT 25 01/29/2022   No results found for: "LDH"  STUDIES:  Exam:  04/17/2022 CT abdomen and Pelvis without Contrast Impression: Decreased sensitivity exam secondary to lack of contrast and minimal motion degradation. Fluid-filled colon, suggesting a diarrheal state. No other explanation for patient's acute symptoms. Prostatectomy. Sclerotic lesions including within the anterior seventh right rib and right iliac are suspicious for progressive osseous metastasis. No findings of extraosseous metastatic disease within the abdomen or pelvis. New lingular nodules up to 59m are indeterminate. Consider non-emergent, outpatient chest CT to evaluate for pulmonary metastasis. Small bowel non rotation. Hepatic Steatosis.   Exam: 02/08/2022 Chest 2 view Impression: No acute abnormality.  Exam: 11/20/2021 NM PET (PSMA) SKULL TO MID THIGH Impression:  CLINICAL DATA:  Prostate carcinoma with biochemical recurrence. EXAM: NUCLEAR MEDICINE PET SKULL BASE TO THIGH TECHNIQUE: 9.4 mCi F18 Piflufolastat (Pylarify) was injected intravenously. Full-ring PET imaging was performed from the skull base to thigh after the radiotracer. CT data was obtained and used for attenuation correction and anatomic localization. COMPARISON:  12/24/2020 FINDINGS: NECK No radiotracer activity in neck lymph nodes. Incidental CT finding: None. CHEST No tracer avid supraclavicular, axillary, mediastinal, or hilar lymph nodes. Interval development of multiple small tracer pulmonary nodules which are concerning for metastatic disease. These nodules are all very small measuring (no more than 5 mm) and are technically too small to characterize by PET-CT. However a few of these nodules are tracer avid including: -Posteromedial right upper lobe nodule measures 5 mm with SUV max of 4.89, image 81/4. -Nodule within the inferior lingula measures 5 mm within SUV max of 5.1, image 94/4. -Nodule within the medial right apex measures 3 mm with SUV max of 3.18, image 65/4. Incidental CT finding: Aortic atherosclerosis and  multi vessel coronary artery calcifications. ABDOMEN/PELVIS Prostate: No focal activity in the prostate bed. Lymph nodes: No abnormal radiotracer accumulation within pelvic or abdominal nodes. Liver: No evidence of liver metastasis. Incidental CT finding: None. SKELETON There is been marked interval progression of tracer avid bone metastases. Innumerable tracer avid bone lesions are now identified compared with solitary tracer avid bone metastases noted on the previous exam. New index lesions include: -FDG avid lesion within the L3 vertebral body with SUV max of 36.52, image 139/4. No corresponding CT changes identified. -Tracer avid bone lesion within the right iliac wing has an SUV max of 34.4, image 165/4. No corresponding CT changes identified. -parasymphyseal lesion within the left pubic bone has an SUV max of 42.15, image 191/4. No corresponding CT changes noted. -Previous tracer avid sclerotic bone lesion involving the right seventh rib has an SUV max of 27.67 on today's study, image 110/4. Previously 27.6 on the previous exam. IMPRESSION: 1. Marked interval progression tracer avid bone metastases. 2. Interval development of multiple tiny lung nodules. Several of these nodules are tracer avid and are worrisome for pulmonary metastases. 3. Aortic Atherosclerosis (ICD10-I70.0). Coronary artery calcifications. Electronically Signed   By: TKerby MoorsM.D.   On: 11/23/2021 10:50     HISTORY:   Past Medical History:  Diagnosis Date   Hyperlipidemia    Malignant [place] of prostate ([place]  Supraventricular tachycardia     Past Surgical History:  Procedure Laterality Date   CORONARY STENT INTERVENTION N/A 02/08/2022   Procedure: CORONARY STENT INTERVENTION;  Surgeon: Nelva Bush, MD;  Location: [facility];  Service: Cardiovascular;  Laterality: N/A;   LEFT HEART CATH AND CORONARY ANGIOGRAPHY N/A 02/08/2022   Procedure: LEFT HEART CATH AND CORONARY ANGIOGRAPHY;  Surgeon: Nelva Bush, MD;  Location: [facility];  Service: Cardiovascular;  Laterality: N/A;   PROSTATECTOMY  07/2014   TONSILLECTOMY      Family History  Problem Relation Age of Onset   Breast cancer Mother 59   Ovarian cancer Mother 75   Breast cancer Paternal Grandmother 73    Social History:  reports that he has never smoked. He has never used smokeless tobacco. He reports that he does not currently use alcohol. He reports that he does not use drugs.The patient is alone today.  Allergies:  Allergies  Allergen Reactions   Atorvastatin    Rosuvastatin Other (See Comments)    Other reaction(s): Muscle pain   Simvastatin Other (See Comments)    Other reaction(s): Joint pain, Muscle pain   Sulfa Antibiotics    Sulfamethoxazole-Trimethoprim Other (See Comments)    Unknown as to interaction was a baby.    Current Medications: Current Outpatient Medications  Medication Sig Dispense Refill   Alirocumab (PRALUENT [place]) Inject into the skin. One injection every 2 weeks Monday     aspirin 81 MG EC tablet Take 81 mg by mouth daily.     Cholecalciferol 25 MCG (1000 UT) tablet Take 1,000 Units by mouth in the morning and at bedtime.     clopidogrel (PLAVIX) 75 MG tablet Take 75 mg by mouth daily.     dexamethasone (DECADRON) 4 MG tablet Take 8 mg by mouth See admin instructions. Take two tablets by mouth twice a day (start the day before chemo, then take daily for 2 days starting te day after chemo; take with food)     diphenhydrAMINE (BENADRYL) 50 MG capsule Take 50 mg by mouth at bedtime as needed.     ezetimibe (ZETIA) 10 MG tablet Take 10 mg by mouth daily.     gabapentin (NEURONTIN) 300 MG capsule Take 1 capsule (300 mg total) by mouth at bedtime. 90 capsule 0   isosorbide mononitrate (IMDUR) 30 MG 24 hr tablet Take 1 tablet by mouth every 8 (eight) hours as needed.     leuprolide (LUPRON) 22.5 MG injection Inject 22.5 mg into the muscle every 3 (three) months.     metoprolol tartrate  (LOPRESSOR) 25 MG tablet Take 12.5 mg by mouth 2 (two) times daily.     morphine (MS CONTIN) 15 MG 12 hr tablet Take 1 tablet (15 mg total) by mouth every 12 (twelve) hours. 60 tablet 0   Multiple Vitamins-Minerals (MULTIVITAMIN WITH MINERALS) tablet Take 1 tablet by mouth daily.     nitroGLYCERIN (NITROSTAT) 0.4 MG SL tablet Place 0.4 mg under the tongue every 5 (five) minutes as needed for chest pain. Repeat every 5 minutes if needed for a total of 3 tablets in 15 minutes. If no relief, CALL 911.     Omega-3 Fatty Acids (FISH OIL) 1000 MG CAPS Take 2,000 mg by mouth in the morning and at bedtime.     predniSONE (DELTASONE) 5 MG tablet Take 1 tablet (5 mg total) by mouth in the morning and at bedtime. 60 tablet 11   Royal Jelly-Bee Pollen-Ginseng (KOREAN GINSENG COMPLEX)  150-250-50 MG CAPS Take 1 capsule by mouth daily.     senna (SENOKOT) 8.6 MG TABS tablet Take 4 tablets by mouth 2 (two) times daily as needed for moderate constipation.     Zoledronic Acid (ZOMETA) 4 MG/100ML IVPB Inject 4 mg into the vein every 3 (three) months.     No current facility-administered medications for this visit.     I,Jasmine M Lassiter,acting as a scribe for [name], MD.,have documented all relevant documentation on the behalf of [name], MD,as directed by  [name], MD while in the presence of [name], MD.

## 2022-05-03 ENCOUNTER — Encounter: Payer: Self-pay | Admitting: Oncology

## 2022-05-03 ENCOUNTER — Inpatient Hospital Stay: Payer: No Typology Code available for payment source | Attending: Hematology and Oncology | Admitting: Oncology

## 2022-05-03 ENCOUNTER — Other Ambulatory Visit: Payer: Self-pay | Admitting: Oncology

## 2022-05-03 ENCOUNTER — Inpatient Hospital Stay: Payer: No Typology Code available for payment source | Attending: Hematology and Oncology

## 2022-05-03 VITALS — BP 143/77 | HR 83 | Temp 97.5°F | Resp 18 | Ht 65.5 in | Wt 193.0 lb

## 2022-05-03 DIAGNOSIS — D649 Anemia, unspecified: Secondary | ICD-10-CM | POA: Diagnosis not present

## 2022-05-03 DIAGNOSIS — Z5189 Encounter for other specified aftercare: Secondary | ICD-10-CM | POA: Diagnosis not present

## 2022-05-03 DIAGNOSIS — C7800 Secondary malignant neoplasm of unspecified lung: Secondary | ICD-10-CM | POA: Diagnosis not present

## 2022-05-03 DIAGNOSIS — C7951 Secondary malignant neoplasm of bone: Secondary | ICD-10-CM | POA: Diagnosis not present

## 2022-05-03 DIAGNOSIS — Z7901 Long term (current) use of anticoagulants: Secondary | ICD-10-CM | POA: Diagnosis not present

## 2022-05-03 DIAGNOSIS — C7952 Secondary malignant neoplasm of bone marrow: Secondary | ICD-10-CM

## 2022-05-03 DIAGNOSIS — C61 Malignant neoplasm of prostate: Secondary | ICD-10-CM

## 2022-05-03 DIAGNOSIS — Z5111 Encounter for antineoplastic chemotherapy: Secondary | ICD-10-CM | POA: Diagnosis not present

## 2022-05-03 DIAGNOSIS — Z7902 Long term (current) use of antithrombotics/antiplatelets: Secondary | ICD-10-CM | POA: Insufficient documentation

## 2022-05-03 DIAGNOSIS — R112 Nausea with vomiting, unspecified: Secondary | ICD-10-CM | POA: Diagnosis not present

## 2022-05-03 DIAGNOSIS — Z79899 Other long term (current) drug therapy: Secondary | ICD-10-CM | POA: Diagnosis not present

## 2022-05-03 LAB — CBC AND DIFFERENTIAL
HCT: 38 — AB (ref 41–53)
Hemoglobin: 12.3 — AB (ref 13.5–17.5)
Neutrophils Absolute: 8
Platelets: 297 10*3/uL (ref 150–400)
WBC: 12.9

## 2022-05-03 LAB — COMPREHENSIVE METABOLIC PANEL
Albumin: 4.3 (ref 3.5–5.0)
Calcium: 9.3 (ref 8.7–10.7)

## 2022-05-03 LAB — PSA: Prostatic Specific Antigen: 235 ng/mL — ABNORMAL HIGH (ref 0.00–4.00)

## 2022-05-03 LAB — HEPATIC FUNCTION PANEL
ALT: 28 U/L (ref 10–40)
AST: 29 (ref 14–40)
Alkaline Phosphatase: 57 (ref 25–125)
Bilirubin, Total: 0.4

## 2022-05-03 LAB — BASIC METABOLIC PANEL
BUN: 16 (ref 4–21)
CO2: 28 — AB (ref 13–22)
Chloride: 103 (ref 99–108)
Creatinine: 0.7 (ref 0.6–1.3)
Glucose: 140
Potassium: 3.5 mEq/L (ref 3.5–5.1)
Sodium: 139 (ref 137–147)

## 2022-05-03 LAB — CBC: RBC: 4.32 (ref 3.87–5.11)

## 2022-05-04 ENCOUNTER — Telehealth: Payer: Self-pay

## 2022-05-04 MED FILL — Dexamethasone Sodium Phosphate Inj 100 MG/10ML: INTRAMUSCULAR | Qty: 1 | Status: AC

## 2022-05-04 NOTE — Telephone Encounter (Signed)
-----   Message from Belva Chimes, LPN sent at [id number]  8:11 AM EST ----- Regarding: FW: call  ----- Message ----- From: [name], MD Sent: 05/03/2022   6:08 PM EST To: Belva Chimes, LPN Subject: call                                           Tell Lynann Bologna I don't understand it but PSA went back down to 235. So I rec. we continue the Taxotere

## 2022-05-04 NOTE — Telephone Encounter (Signed)
Patient notified and voiced understanding will continue treatment/medication.

## 2022-05-05 ENCOUNTER — Inpatient Hospital Stay: Payer: No Typology Code available for payment source

## 2022-05-05 VITALS — BP 150/79 | HR 74 | Temp 97.9°F | Resp 18 | Ht 65.5 in | Wt 193.2 lb

## 2022-05-05 DIAGNOSIS — C61 Malignant neoplasm of prostate: Secondary | ICD-10-CM

## 2022-05-05 DIAGNOSIS — Z5111 Encounter for antineoplastic chemotherapy: Secondary | ICD-10-CM | POA: Diagnosis not present

## 2022-05-05 DIAGNOSIS — C7951 Secondary malignant neoplasm of bone: Secondary | ICD-10-CM

## 2022-05-05 MED ORDER — SODIUM CHLORIDE 0.9 % IV SOLN: Freq: Once | INTRAVENOUS | Status: AC

## 2022-05-05 MED ORDER — HEPARIN SOD (PORK) LOCK FLUSH 100 UNIT/ML IV SOLN
500.0000 [IU] | Freq: Once | INTRAVENOUS | Status: AC | PRN
  Administered 2022-05-05: 500 [IU]

## 2022-05-05 MED ORDER — SODIUM CHLORIDE 0.9% FLUSH
10.0000 mL | INTRAVENOUS | Status: DC | PRN
  Administered 2022-05-05: 10 mL

## 2022-05-05 MED ORDER — SODIUM CHLORIDE 0.9 % IV SOLN
10.0000 mg | Freq: Once | INTRAVENOUS | Status: AC
  Administered 2022-05-05: 10 mg via INTRAVENOUS
  Filled 2022-05-05: qty 10

## 2022-05-05 MED ORDER — SODIUM CHLORIDE 0.9 % IV SOLN
59.5000 mg/m2 | Freq: Once | INTRAVENOUS | Status: AC
  Administered 2022-05-05: 120 mg via INTRAVENOUS
  Filled 2022-05-05: qty 12

## 2022-05-05 NOTE — Patient Instructions (Signed)
[facility]  Discharge Instructions: Thank you for choosing [facility] to provide your oncology and hematology care.  If you have a lab appointment with the [facility], please go directly to the [facility] and check in at the registration area.   Wear comfortable clothing and clothing appropriate for easy access to any Portacath or PICC line.   We strive to give you quality time with your provider. You may need to reschedule your appointment if you arrive late (15 or more minutes).  Arriving late affects you and other patients whose appointments are after yours.  Also, if you miss three or more appointments without notifying the office, you may be dismissed from the clinic at the provider's discretion.      For prescription refill requests, have your pharmacy contact our office and allow 72 hours for refills to be completed.    Today you received the following chemotherapy and/or immunotherapy agents taxotere      To help prevent nausea and vomiting after your treatment, we encourage you to take your nausea medication as directed.  BELOW ARE SYMPTOMS THAT SHOULD BE REPORTED IMMEDIATELY: *FEVER GREATER THAN 100.4 F (38 C) OR HIGHER *CHILLS OR SWEATING *NAUSEA AND VOMITING THAT IS NOT CONTROLLED WITH YOUR NAUSEA MEDICATION *UNUSUAL SHORTNESS OF BREATH *UNUSUAL BRUISING OR BLEEDING *URINARY PROBLEMS (pain or burning when urinating, or frequent urination) *BOWEL PROBLEMS (unusual diarrhea, constipation, pain near the anus) TENDERNESS IN MOUTH AND THROAT WITH OR WITHOUT PRESENCE OF ULCERS (sore throat, sores in mouth, or a toothache) UNUSUAL RASH, SWELLING OR PAIN  UNUSUAL VAGINAL DISCHARGE OR ITCHING   Items with * indicate a potential emergency and should be followed up as soon as possible or go to the Emergency Department if any problems should occur.  Please show the CHEMOTHERAPY ALERT CARD or IMMUNOTHERAPY ALERT CARD at check-in to the  Emergency Department and triage nurse.  Should you have questions after your visit or need to cancel or reschedule your appointment, please contact [facility]  Dept: [phone]  and follow the prompts.  Office hours are 8:00 a.m. to 4:30 p.m. Monday - Friday. Please note that voicemails left after 4:00 p.m. may not be returned until the following business day.  We are closed weekends and major holidays. You have access to a nurse at all times for urgent questions. Please call the main number to the clinic Dept: [phone] and follow the prompts.  For any non-urgent questions, you may also contact your provider using MyChart. We now offer e-Visits for anyone 44 and older to request care online for non-urgent symptoms. For details visit [email or web address].   Also download the MyChart app! Go to the app store, search "MyChart", open the app, select [facility], and log in with your MyChart username and password.

## 2022-05-07 ENCOUNTER — Inpatient Hospital Stay: Payer: No Typology Code available for payment source

## 2022-05-07 VITALS — BP 126/59 | HR 55 | Temp 97.6°F | Resp 18 | Ht 65.5 in | Wt 195.5 lb

## 2022-05-07 DIAGNOSIS — C61 Malignant neoplasm of prostate: Secondary | ICD-10-CM

## 2022-05-07 DIAGNOSIS — Z5111 Encounter for antineoplastic chemotherapy: Secondary | ICD-10-CM | POA: Diagnosis not present

## 2022-05-07 DIAGNOSIS — C7951 Secondary malignant neoplasm of bone: Secondary | ICD-10-CM

## 2022-05-07 MED ORDER — PEGFILGRASTIM-CBQV 6 MG/0.6ML ~~LOC~~ SOSY
6.0000 mg | PREFILLED_SYRINGE | Freq: Once | SUBCUTANEOUS | Status: AC
  Administered 2022-05-07: 6 mg via SUBCUTANEOUS
  Filled 2022-05-07: qty 0.6

## 2022-05-14 ENCOUNTER — Encounter: Payer: Self-pay | Admitting: Oncology

## 2022-05-14 ENCOUNTER — Telehealth: Payer: Self-pay

## 2022-05-14 ENCOUNTER — Other Ambulatory Visit: Payer: Self-pay | Admitting: Hematology and Oncology

## 2022-05-14 DIAGNOSIS — C7951 Secondary malignant neoplasm of bone: Secondary | ICD-10-CM

## 2022-05-14 DIAGNOSIS — C61 Malignant neoplasm of prostate: Secondary | ICD-10-CM

## 2022-05-14 MED ORDER — GABAPENTIN 300 MG PO CAPS: 300.0000 mg | ORAL_CAPSULE | Freq: Every day | ORAL | 0 refills | Status: DC

## 2022-05-14 NOTE — Telephone Encounter (Signed)
-----   Message from Marvia Pickles, PA-C sent at 05/14/2022  8:23 AM EST ----- Would you call him and let him know I sent in gabapentin at bedtime. This can make him sleepy so have him be careful getting up at night. Thanks ----- Message ----- From: [name], MD Sent: 05/13/2022   5:22 PM EST To: Marvia Pickles, PA-C  I just told him this could happen. Let's start gabapentin hs 1st & see how he tolerates ----- Message ----- From: Marvia Pickles, PA-C Sent: 05/13/2022   3:03 PM EST To: [name], MD  Patient called and somehow got my office. He reports burning pain toes and states you said to call if this worsened. I did not see anything in the note. Would you like to start him on gabapentin? Would you just start with bedtime dosing or have him titrate up immediately?

## 2022-05-17 ENCOUNTER — Encounter: Payer: Self-pay | Admitting: Oncology

## 2022-05-21 ENCOUNTER — Telehealth: Payer: Self-pay

## 2022-05-21 ENCOUNTER — Encounter: Payer: Self-pay | Admitting: Oncology

## 2022-05-21 NOTE — Telephone Encounter (Signed)
Spoke with Dr. Hinton Rao she suggests holding gabapentin and increase fluids over the weekend.  If he gets to feeling worse then present to the ED.  He has an appointment with her on Monday 05/24/2022.  Called Marvin Johnson and he stopped the gabapentin on Monday 05/17/22 because he thought that could be causing the dizziness.    He assured me that he would go to ED if symptoms get worse.

## 2022-05-24 ENCOUNTER — Inpatient Hospital Stay: Payer: No Typology Code available for payment source | Admitting: Oncology

## 2022-05-24 ENCOUNTER — Other Ambulatory Visit: Payer: Self-pay | Admitting: Oncology

## 2022-05-24 ENCOUNTER — Encounter: Payer: Self-pay | Admitting: Oncology

## 2022-05-24 ENCOUNTER — Inpatient Hospital Stay: Payer: No Typology Code available for payment source | Attending: Hematology and Oncology

## 2022-05-24 VITALS — BP 138/85 | HR 91 | Temp 98.1°F | Resp 20 | Ht 65.5 in | Wt 194.1 lb

## 2022-05-24 DIAGNOSIS — C7951 Secondary malignant neoplasm of bone: Secondary | ICD-10-CM | POA: Insufficient documentation

## 2022-05-24 DIAGNOSIS — G629 Polyneuropathy, unspecified: Secondary | ICD-10-CM | POA: Insufficient documentation

## 2022-05-24 DIAGNOSIS — D72829 Elevated white blood cell count, unspecified: Secondary | ICD-10-CM | POA: Diagnosis not present

## 2022-05-24 DIAGNOSIS — C61 Malignant neoplasm of prostate: Secondary | ICD-10-CM

## 2022-05-24 DIAGNOSIS — D649 Anemia, unspecified: Secondary | ICD-10-CM | POA: Insufficient documentation

## 2022-05-24 DIAGNOSIS — I2510 Atherosclerotic heart disease of native coronary artery without angina pectoris: Secondary | ICD-10-CM | POA: Diagnosis not present

## 2022-05-24 DIAGNOSIS — C7800 Secondary malignant neoplasm of unspecified lung: Secondary | ICD-10-CM | POA: Diagnosis not present

## 2022-05-24 DIAGNOSIS — C7952 Secondary malignant neoplasm of bone marrow: Secondary | ICD-10-CM | POA: Diagnosis not present

## 2022-05-24 DIAGNOSIS — D75839 Thrombocytosis, unspecified: Secondary | ICD-10-CM | POA: Insufficient documentation

## 2022-05-24 LAB — BASIC METABOLIC PANEL
BUN: 15 (ref 4–21)
CO2: 28 — AB (ref 13–22)
Chloride: 106 (ref 99–108)
Creatinine: 1 (ref 0.6–1.3)
Glucose: 140
Potassium: 4.3 mEq/L (ref 3.5–5.1)
Sodium: 140 (ref 137–147)

## 2022-05-24 LAB — COMPREHENSIVE METABOLIC PANEL
Albumin: 4.2 (ref 3.5–5.0)
Calcium: 9.3 (ref 8.7–10.7)

## 2022-05-24 LAB — CBC AND DIFFERENTIAL
HCT: 22 — AB (ref 41–53)
Hemoglobin: 7 — AB (ref 13.5–17.5)
Neutrophils Absolute: 23.39
Platelets: 419 10*3/uL — AB (ref 150–400)
WBC: 27.2

## 2022-05-24 LAB — HEPATIC FUNCTION PANEL
ALT: 24 U/L (ref 10–40)
AST: 125 — AB (ref 14–40)
Alkaline Phosphatase: 76 (ref 25–125)
Bilirubin, Total: 0.5

## 2022-05-24 LAB — PSA: Prostatic Specific Antigen: 203.44 ng/mL — ABNORMAL HIGH (ref 0.00–4.00)

## 2022-05-24 LAB — CBC: RBC: 2.49 — AB (ref 3.87–5.11)

## 2022-05-24 NOTE — Progress Notes (Signed)
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 He has been on Enzalutamide 160 mg daily, as well as leuprolide 22.5 mg, and zoledronic acid every 3 months.  His pain is controlled with MS Contin 15 mg twice daily with occasional Tylenol for breakthrough.   He was on Enzalutamide since June 2018.  He then had a steadily rising PSA, up to 83.49 in November 2022.  PSMA PET in October 2022 revealed an oligometastasis skeletal lesion it in the anterior right 7th rib, with increased activity, and very subtle sclerotic change at T10, but no evidence of progressive disease.The PSA has continued to fluctuate up and down.  It was down to 31 in February of 2023, then back up to 66 in May.  It then increased dramatically to 334 in August and he was scheduled for a PSMA PET scan.  This revealed severe progression of disease with several tiny lung nodules which did have hypermetabolic activity, with SUV up to 5.  He had increased bone lesions which are now innumerable, including major lesions at L3, the right iliac wing, and the left pubic bone. These were not visible on CT scan. He stopped the Enzalutamide and started the Taxotere chemotherapy in September 2023 and is tolerating this well. His PSA went down from 536 to 400.75 after 3 cycles and went down to 284 with the 5th cycle, but then back up to 406 with his 6th cycle.  His latest one was 235 in February, 2024.   Secondary malignant neoplasm of bone and bone marrow ([place]) He continues zoledronic acid every 3 months in addition to his cancer therapy. He last received this on February 11, 2022.  He now has marked progression of his bone metastases but minimal symptoms.  Anemia. He has had a  dramatic drop in his hemoglobin from 12.3 to 7.0 in 3 weeks time, associated with reported melena. In view of his cardiac history, I feel he needs to be admitted and transfused in a safe environment. He is very symptomatic. Hopefully he will just be in for 1-2 nights and I will follow-up next week. We will need to schedule GI follow-up as an outpatient.   Leukocytosis This is partially due to the Neulasta injection, but I think also reactive to his GI hemorrhage.   Thrombocytosis I feel this is also reactive to his GI bleed which is likely upper GI but we do not have a Copywriter, advertising available here at [facility] so I will plan outpatient referral once stabilized.     Coronary Artery Disease He had an MI in November, 2023 and has had 9 cardiac stents total.  Peripheral Neuropathy This is from his Taxotere and quite mild. I had prescribed Gabapentin but he stopped that a week ago, thinking it was related to his current symptoms of severe fatigue and weakness. Eventually this will result in cessation of his chemotherapy, but we may be able to get in 1-2 more doses, depending on his PSA response. It will be on hold this week.   Plan:  Patient had constipation that resulted in black stool. His symptoms started about 6 days ago and he has had no further melena. His WBC is at 27.2 and platelet count at 419 today, his hemoglobin dropped from  12.3 to 7.0. His last PSA on 05/03/2022 was at 235.00 as this drop has happened in 3 weeks time and he does have a history of cardiac disease. He has had a prior Myocardial infarction in November, 2023 and has had 9 cardiac stents total. He stopped taking Gabapentin for 1 week now. I advised him to stop taking arthritis medications but he has not taken any recently. He is currently taking an injection every 2 weeks for his cholesterol. There is no GI doctor available at the moment until next week. However, I spoke with Dr. Chilton Si and he will be admitted into the hospital  for transfusion and stabilization. His PSA today is pending. I will see him back in 1 week with CBC, CMP, and type and hold. The patient understands the plans discussed today and is in agreement with them.  He knows to contact our office if he develops concerns prior to his next appointment.   His questions were answered.   I provided 45 minutes of face-to-face time during this encounter and > 50% was spent counseling as documented under my assessment and plan.     [name], MD  [facility] AT [facility] [address] [place] [address] Dept: [phone] Dept Fax: [phone]   Orders Placed This Encounter  Procedures   CBC and differential    This external order was created through the Results Console.   CBC    This external order was created through the Results Console.   Basic metabolic panel    This external order was created through the Results Console.   Comprehensive metabolic panel    This external order was created through the Results Console.   Hepatic function panel    This external order was created through the Results Console.   I have reviewed this report as typed by the medical scribe, and it is complete and accurate.  [name]   06/18/22  8:45 AM     CHIEF COMPLAINT:  CC: Metastatic prostate cancer to bone, progressive  Current Treatment: Leuprolide/zoledronic acid and docetaxel  HISTORY OF PRESENT ILLNESS:  Marvin Johnson is a 72 year old male with a history of stage IV (T3b N1 M0) prostate cancer diagnosed in January 2016 when he was found to have a PSA of 51.8.  He was treated with a laparoscopic prostatectomy in May 2016.  Pathology revealed adenocarcinoma with a Gleason 8.  There was a positive posterior margin with extraprostatic extension, as well as left retro-urethral tissue positive and extension to the seminal vesicles bilaterally with multiple positive margins.  Two  lymph nodes from the right side were negative, but 2/4 lymph nodes from the left side were positive for metastasis. Repeat bone scan in June 2016 revealed a new lesion at T10, which was not present on his baseline bone scan.  PSA at Dr. Cindee Salt office in early August 2016 remained elevated at 56.4.   We began seeing him in August 2016. The PSA was up to 216.3 in late August 2016, so hormonal therapy was recommended  X-rays of the thoracic spine revealed osteoarthritis and scoliosis, as well as evidence of demineralization, but we could not visualize a metastatic lesion.[name] He started leuprolide in September 2016.  The PSA initially dropped steadily with leuprolide.  Bone density scan in November 2016 revealed osteopenia, with a T-score of -2.1 in the spine and a T-score of -0.9 in the femur.  He had been taking vitamin-D  1000 international units daily, but not calcium, so calcium 600 mg twice daily was added at that time.    Leuprolide was held in February 2017 because of chest pain.  EKG was unremarkable.  He went to the [place] and he was referred to the [facility] for placement of 2 coronary artery stents for 95% and 99% occlusions.  He then had a third coronary artery stent placed later in the summer.  Leuprolide was resumed in March 2017.  In September 2017, he had an increase in the PSA, so we repeated a bone scan.  This revealed intense uptake within the right posterior elements of T10, but no other areas of metastasis.  We then added bicalutamide 50 mg daily to his leuprolide.     He had a steadily increasing PSA despite the bicalutamide, so this was discontinued in March 2018.  He eventually was placed on MS Contin 15 mg twice daily, with oxycodone 10 mg every 4 hours as needed for breakthrough pain.  MRI thoracic spine in April 2018 revealed increasing tumor at T10 with epidural spread.  Bone scan at that time revealed increased activity in the T10 lesion, which was stable.  There was a questionable tiny focus  in the right ischium/acetabular rim.  Plain x-rays of the bilateral hips and pelvis did not reveal any focal abnormality. The PSA increased from 27.5 in March, to 72.2 in May, then to 92.2 in June 2018.  He was referred for radiotherapy to the T10 lesion due to the severe pain with improvement in his pain. He was started on zoledronic acid along with his leuprolide in June 2018.  He had a Port-A-Cath placed in anticipation of chemotherapy, but then we recommended that he be placed on enzalutamide 160 mg daily instead, in order to delay the time to initiation of chemotherapy.  He started enzalutamide in June 2018.  The PSA became undetectable in October 2018.  We continued MS Contin, but discontinued the oxycodone. He did not tolerate meloxicam, so uses Tylenol as needed for breakthrough pain.     He had a stress test in January 2020 and was referred to [facility] for coronary artery stenting, which was done in February.  Zoledronic acid had been given monthly for over 2 years, so was changed to every 3 months in October 2020.   The PSA started to increase in January 2021 at which time it was 0.2.  CT chest, abdomen and pelvis in March 2021 not reveal any lymphadenopathy or soft tissue metastatic disease.  Hepatic steatosis was seen.  There was a stable sclerotic osseous lesion of the T10 vertebral body, as well as a subtle sclerotic lesion of the L3 vertebral body, also possibly a small metastatic lesion.  Whole body bone scan did not reveal continued increased activity of T10 and right acetabulum posteriorly.  The PSA was 0.5 in March.  PSA had increased to 3.0 in June.  Axumin PET imaging from July did not reveal any evidence of active prostate carcinoma, local recurrence, or metastatic disease. The PSA continued to slowly rise and was up to 5.9 in September, 10.9 in October, and 16.3 in December.     The PSA went up to 23.81 in March 2022, so an Axumin PET scan was ordered which revealed an  oligometastasis skeletal lesion in the anterior right 7th rib with very subtle sclerotic change at this level. PSA in June decreased to 18.6, but then in September increased to 58.  In  view of the significant rise in his PSA, a prostate specific PET scan was ordered.  PSMA PET in October 2022 revealed a persistent focus of intense radiotracer activity in the anterior RIGHT seventh rib with increased in activity from comparison exam and subtle sclerotic changes on CT. Findings remain consistent with oligometastatic skeletal metastasis. Sclerotic lesions in the T10 vertebral body without radiotracer activity potentially represented prior treated prostate cancer metastasis.  We recommended he continue his current treatment with enzalutamide, leuprolide and zoledronic acid.  PSA in November continued to increase to 83.49.  Even though his PSA continued to steadily increase, we did not recommend a change in therapy based on this alone.  He had no severe pain and no increase in pain and was having a good quality of life.  The PSA has continued to fluctuate up and down and was 31 in February and 78 in May.   Oncology History  Malignant neoplasm of prostate  03/30/2014 Cancer Staging   Staging form: Prostate, AJCC 8th Edition - Clinical stage from 03/30/2014: Stage IVA (cT3b, cN1, cM0, PSA: 51.8, Grade Group: 4) - Signed by [name], MD on 11/14/2020 Histopathologic type: Adenocarcinoma, NOS Stage prefix: Initial diagnosis Prostate specific antigen (PSA) range: 20 or greater Gleason primary pattern: 4 Gleason secondary pattern: 4 Gleason score: 8 Histologic grading system: 5 grade system Location of positive needle core biopsies: Beyond primary site Prognostic indicators: May 2016 Robotic prostatectomy with mult. Pos margins, extra prostatic extension, 2/6 pos nodes. Scan June 2016 positive at T10  PSA up to 216.3 by August Placed on lupron and casodex Stage used in treatment planning:  Yes National guidelines used in treatment planning: Yes Type of national guideline used in treatment planning: NCCN   02/18/2020 Initial Diagnosis   Malignant neoplasm of prostate (HCC)   12/01/2021 -  Chemotherapy   Patient is on Treatment Plan : PROSTATE Docetaxel (75) + Prednisone q21d     Secondary malignant neoplasm of bone and bone marrow  02/18/2020 Initial Diagnosis   Secondary malignant neoplasm of bone and bone marrow (HCC)   12/01/2021 -  Chemotherapy   Patient is on Treatment Plan : PROSTATE Docetaxel (75) + Prednisone q21d         INTERVAL HISTORY:  Cannen is here today for repeat clinical assessment for progressive metastatic prostate cancer to bone. Patient states that he feels very fatigued and complains of lower back pain at night. Patient had constipation that resulted in black stool. His symptoms started about 6 days ago and he has had no further melena. His WBC is at 27.2 and platelet count at 419 today, his hemoglobin dropped from 12.3 to 7.0. His last PSA on 05/03/2022 was at 235.00. I feel as he will need a transfusion of packed red blood cells. I am concerned it could be an ulcer and that he needs to be in the hospital as this drop happened in 3 weeks time and he does have a history of cardiac disease. He has had a prior Myocardial infarction in November, 2023 and has had 9 cardiac stents total. He stopped taking Gabapentin for 1 week now. I advised him to stop taking arthritis medications but he has not taken any recently. He is currently taking an injection every 2 weeks for his cholesterol. There is no GI doctor available at the moment until next week. However, I spoke with Dr. Chilton Si and he will be admitted into the hospital for transfusion and stabilization. His PSA today is pending.  I will see him back in 1 week with CBC, CMP, and type and hold. His CMP is normal other than an SGOT of 125.  He denies signs of infection such as sore throat, sinus drainage, cough, or  urinary symptoms.  He denies fevers or recurrent chills. He denies pain. He denies nausea, vomiting, chest pain, dyspnea or cough. His weight has increased 1 pounds over last 2.5 weeks . He is accompanied at today's visit with his friend.    REVIEW OF SYSTEMS:  Review of Systems  Constitutional:  Positive for fatigue. Negative for appetite change, chills, diaphoresis, fever and unexpected weight change.  HENT:  Negative.  Negative for hearing loss, lump/mass, mouth sores, nosebleeds, sore throat, tinnitus, trouble swallowing and voice change.   Eyes: Negative.  Negative for eye problems and icterus.  Respiratory:  Positive for shortness of breath. Negative for chest tightness, cough, hemoptysis and wheezing.   Cardiovascular: Negative.  Negative for chest pain, leg swelling and palpitations.  Gastrointestinal:  Positive for blood in stool. Negative for abdominal distention, abdominal pain, constipation, diarrhea, nausea, rectal pain and vomiting.  Endocrine: Negative.  Negative for hot flashes.  Genitourinary: Negative.  Negative for bladder incontinence, difficulty urinating, dyspareunia, dysuria, frequency, hematuria, nocturia, pelvic pain and penile discharge.   Musculoskeletal:  Positive for arthralgias and back pain (lower back pain). Negative for flank pain, gait problem, myalgias, neck pain and neck stiffness.  Skin: Negative.  Negative for itching, rash and wound.       Pale  Neurological:  Positive for extremity weakness. Negative for dizziness, gait problem, headaches, light-headedness, numbness, seizures and speech difficulty.  Hematological: Negative.  Negative for adenopathy. Does not bruise/bleed easily.  Psychiatric/Behavioral: Negative.  Negative for confusion, decreased concentration, depression, sleep disturbance and suicidal ideas. The patient is not nervous/anxious.     VITALS:  Blood pressure 138/85, pulse 91, temperature 98.1 F (36.7 C), temperature source Oral, resp.  rate 20, height 5' 5.5" (1.664 m), weight 194 lb 1.6 oz (88 kg), SpO2 99 %.  Wt Readings from Last 3 Encounters:  06/11/22 193 lb 1.6 oz (87.6 kg)  06/02/22 192 lb 9.6 oz (87.4 kg)  05/24/22 194 lb 1.6 oz (88 kg)    Body mass index is 31.81 kg/m.  Performance status (ECOG): 1 - Symptomatic but completely ambulatory  PHYSICAL EXAM:  Physical Exam Vitals and nursing note reviewed. Exam conducted with a chaperone present.  Constitutional:      General: He is not in acute distress.    Appearance: Normal appearance. He is normal weight. He is not ill-appearing, toxic-appearing or diaphoretic.  HENT:     Head: Normocephalic and atraumatic.     Right Ear: Tympanic membrane, ear canal and external ear normal. There is no impacted cerumen.     Left Ear: Tympanic membrane, ear canal and external ear normal. There is no impacted cerumen.     Nose: Nose normal. No congestion or rhinorrhea.     Mouth/Throat:     Mouth: Mucous membranes are moist.     Pharynx: Oropharynx is clear. No oropharyngeal exudate or posterior oropharyngeal erythema.  Eyes:     General: No scleral icterus.       Left eye: No discharge.     Extraocular Movements: Extraocular movements intact.     Conjunctiva/sclera: Conjunctivae normal.     Pupils: Pupils are equal, round, and reactive to light.  Neck:     Vascular: No carotid bruit.  Cardiovascular:     Rate  and Rhythm: Normal rate and regular rhythm.     Pulses: Normal pulses.     Heart sounds: Normal heart sounds. No murmur heard.    No friction rub. No gallop.  Pulmonary:     Effort: Pulmonary effort is normal. No respiratory distress.     Breath sounds: Normal breath sounds. No stridor. No wheezing, rhonchi or rales.  Chest:     Chest wall: No tenderness.  Abdominal:     General: Bowel sounds are normal. There is no distension.     Palpations: Abdomen is soft. There is no hepatomegaly, splenomegaly or mass.     Tenderness: There is no abdominal tenderness.  There is no right CVA tenderness, left CVA tenderness, guarding or rebound.     Hernia: No hernia is present.  Musculoskeletal:        General: No swelling, tenderness, deformity or signs of injury. Normal range of motion.     Cervical back: Normal range of motion and neck supple. No rigidity or tenderness.     Right lower leg: No edema.     Left lower leg: No edema.  Lymphadenopathy:     Cervical: No cervical adenopathy.     Upper Body:     Right upper body: No supraclavicular or axillary adenopathy.     Left upper body: No supraclavicular or axillary adenopathy.     Lower Body: No right inguinal adenopathy. No left inguinal adenopathy.  Skin:    General: Skin is warm and dry.     Coloration: Skin is pale (very pale). Skin is not jaundiced.     Findings: No bruising, erythema, lesion or rash.  Neurological:     Mental Status: He is alert and oriented to person, place, and time.     Cranial Nerves: No cranial nerve deficit.     Sensory: No sensory deficit.     Motor: No weakness.     Coordination: Coordination normal.     Gait: Gait normal.     Deep Tendon Reflexes: Reflexes normal.  Psychiatric:        Mood and Affect: Mood normal.        Behavior: Behavior normal.        Thought Content: Thought content normal.        Judgment: Judgment normal.    LABS:       Latest Ref Rng & Units 06/11/2022   12:00 AM 05/24/2022   12:00 AM 05/03/2022   12:00 AM  CBC  WBC  12.9     27.2     12.9      Hemoglobin 13.5 - 17.5 11.3     7.0     12.3      Hematocrit 41 - 53 35     22     38      Platelets 150 - 400 K/uL 287     419     297         This result is from an external source.      Latest Ref Rng & Units 06/11/2022    8:05 AM 05/24/2022   12:00 AM 05/03/2022   12:00 AM  CMP  Glucose 70 - 99 mg/dL 95     BUN 8 - 23 mg/dL 19  15     16       Creatinine 0.61 - 1.24 mg/dL 4.09  1.0     0.7      Sodium 135 - 145 mmol/L 139  140  139      Potassium 3.5 - 5.1 mmol/L 4.3  4.3      3.5      Chloride 98 - 111 mmol/L 103  106     103      CO2 22 - 32 mmol/L 26  28     28       Calcium 8.9 - 10.3 mg/dL 9.2  9.3     9.3      Total Protein 6.5 - 8.1 g/dL 7.4     Total Bilirubin 0.3 - 1.2 mg/dL 0.5     Alkaline Phos 38 - 126 U/L 54  76     57      AST 15 - 41 U/L 20  125     29      ALT 0 - 44 U/L 19  24     28          This result is from an external source.   Component Ref Range & Units 3 wk ago (05/03/22) 1 mo ago (04/02/22) 2 mo ago (03/12/22) 3 mo ago (02/19/22) 3 mo ago (01/29/22) 4 mo ago (01/08/22)  Prostatic Specific Antigen 0.00 - 4.00 ng/mL 235.00 High  406.00 High  CM 284.59 High  CM 403.39 High  CM 400.75 High  CM 536.11 High  CM     No results found for: "CEA1", "CEA" / No results found for: "CEA1", "CEA" Lab Results  Component Value Date   PSA1 334.0 (H) 10/30/2021    No results found for: "BJY782" No results found for: "CAN125"  No results found for: "TOTALPROTELP", "ALBUMINELP", "A1GS", "A2GS", "BETS", "BETA2SER", "GAMS", "MSPIKE", "SPEI" Lab Results  Component Value Date   TIBC 398 01/29/2022   FERRITIN 206 01/29/2022   IRONPCTSAT 25 01/29/2022   No results found for: "LDH"  STUDIES:  Exam: 04/17/2022 CT abdomen and Pelvis without Contrast Impression: Decreased sensitivity exam secondary to lack of contrast and minimal motion degradation. Fluid-filled colon, suggesting a diarrheal state. No other explanation for patient's acute symptoms. Prostatectomy. Sclerotic lesions including within the anterior seventh right rib and right iliac are suspicious for progressive osseous metastasis. No findings of extraosseous metastatic disease within the abdomen or pelvis. New lingular nodules up to 7mm are indeterminate. Consider non-emergent, outpatient chest CT to evaluate for pulmonary metastasis. Small bowel non rotation. Hepatic Steatosis.   Exam: 02/08/2022 Chest 2 view Impression: No acute abnormality.  Exam: 11/20/2021 NM PET (PSMA)  SKULL TO MID THIGH Impression:  CLINICAL DATA:  Prostate carcinoma with biochemical recurrence. EXAM: NUCLEAR MEDICINE PET SKULL BASE TO THIGH TECHNIQUE: 9.4 mCi F18 Piflufolastat (Pylarify) was injected intravenously. Full-ring PET imaging was performed from the skull base to thigh after the radiotracer. CT data was obtained and used for attenuation correction and anatomic localization. COMPARISON:  12/24/2020 FINDINGS: NECK No radiotracer activity in neck lymph nodes. Incidental CT finding: None. CHEST No tracer avid supraclavicular, axillary, mediastinal, or hilar lymph nodes. Interval development of multiple small tracer pulmonary nodules which are concerning for metastatic disease. These nodules are all very small measuring (no more than 5 mm) and are technically too small to characterize by PET-CT. However a few of these nodules are tracer avid including: -Posteromedial right upper lobe nodule measures 5 mm with SUV max of 4.89, image 81/4. -Nodule within the inferior lingula measures 5 mm within SUV max of 5.1, image 94/4. -Nodule within the medial right apex measures 3 mm with SUV max of 3.18, image 65/4. Incidental CT finding: Aortic  atherosclerosis and multi vessel coronary artery calcifications. ABDOMEN/PELVIS Prostate: No focal activity in the prostate bed. Lymph nodes: No abnormal radiotracer accumulation within pelvic or abdominal nodes. Liver: No evidence of liver metastasis. Incidental CT finding: None. SKELETON There is been marked interval progression of tracer avid bone metastases. Innumerable tracer avid bone lesions are now identified compared with solitary tracer avid bone metastases noted on the previous exam. New index lesions include: -FDG avid lesion within the L3 vertebral body with SUV max of 36.52, image 139/4. No corresponding CT changes identified. -Tracer avid bone lesion within the right iliac wing has an SUV max of 34.4, image 165/4. No corresponding CT changes identified.  -parasymphyseal lesion within the left pubic bone has an SUV max of 42.15, image 191/4. No corresponding CT changes noted. -Previous tracer avid sclerotic bone lesion involving the right seventh rib has an SUV max of 27.67 on today's study, image 110/4. Previously 27.6 on the previous exam. IMPRESSION: 1. Marked interval progression tracer avid bone metastases. 2. Interval development of multiple tiny lung nodules. Several of these nodules are tracer avid and are worrisome for pulmonary metastases. 3. Aortic Atherosclerosis (ICD10-I70.0). Coronary artery calcifications. Electronically Signed   By: Signa Kell M.D.   On: 11/23/2021 10:50     HISTORY:   Past Medical History:  Diagnosis Date   Hyperlipidemia    Malignant neoplasm of prostate (HCC)    Supraventricular tachycardia     Past Surgical History:  Procedure Laterality Date   CORONARY STENT INTERVENTION N/A 02/08/2022   Procedure: CORONARY STENT INTERVENTION;  Surgeon: Yvonne Kendall, MD;  Location: [facility];  Service: Cardiovascular;  Laterality: N/A;   LEFT HEART CATH AND CORONARY ANGIOGRAPHY N/A 02/08/2022   Procedure: LEFT HEART CATH AND CORONARY ANGIOGRAPHY;  Surgeon: Yvonne Kendall, MD;  Location: [facility];  Service: Cardiovascular;  Laterality: N/A;   PROSTATECTOMY  07/2014   TONSILLECTOMY      Family History  Problem Relation Age of Onset   Breast cancer Mother 38   Ovarian cancer Mother 64   Breast cancer Paternal Grandmother 53    Social History:  reports that he has never smoked. He has never used smokeless tobacco. He reports that he does not currently use alcohol. He reports that he does not use drugs.The patient is alone today.  Allergies:  Allergies  Allergen Reactions   Atorvastatin    Rosuvastatin Other (See Comments)    Other reaction(s): Muscle pain   Simvastatin Other (See Comments)    Other reaction(s): Joint pain, Muscle pain   Sulfa Antibiotics     Sulfamethoxazole-Trimethoprim Other (See Comments)    Unknown as to interaction was a baby.    Current Medications: Current Outpatient Medications  Medication Sig Dispense Refill   APPLE CIDER VINEGAR PO Take by mouth.     Alirocumab (PRALUENT [place]) Inject into the skin. One injection every 2 weeks Monday     Cholecalciferol 25 MCG (1000 UT) tablet Take 1,000 Units by mouth in the morning and at bedtime.     clopidogrel (PLAVIX) 75 MG tablet Take 75 mg by mouth daily.     dexamethasone (DECADRON) 4 MG tablet Take 8 mg by mouth See admin instructions. Take two tablets by mouth twice a day (start the day before chemo, then take daily for 2 days starting te day after chemo; take with food)     diphenhydrAMINE (BENADRYL) 50 MG capsule Take 50 mg by mouth at bedtime as needed.  ezetimibe (ZETIA) 10 MG tablet Take 10 mg by mouth daily.     isosorbide mononitrate (IMDUR) 30 MG 24 hr tablet Take 1 tablet by mouth every 8 (eight) hours as needed.     leuprolide (LUPRON) 22.5 MG injection Inject 22.5 mg into the muscle every 3 (three) months.     metoprolol tartrate (LOPRESSOR) 25 MG tablet Take 12.5 mg by mouth 2 (two) times daily.     morphine (MS CONTIN) 15 MG 12 hr tablet Take 1 tablet (15 mg total) by mouth every 12 (twelve) hours. 60 tablet 0   Multiple Vitamins-Minerals (MULTIVITAMIN WITH MINERALS) tablet Take 1 tablet by mouth daily.     nitroGLYCERIN (NITROSTAT) 0.4 MG SL tablet Place 0.4 mg under the tongue every 5 (five) minutes as needed for chest pain. Repeat every 5 minutes if needed for a total of 3 tablets in 15 minutes. If no relief, CALL 911.     Omega-3 Fatty Acids (FISH OIL) 1000 MG CAPS Take 2,000 mg by mouth in the morning and at bedtime.     pantoprazole (PROTONIX) 40 MG tablet Take 40 mg by mouth 2 (two) times daily.     polyethylene glycol powder (GLYCOLAX/MIRALAX) 17 GM/SCOOP powder Take 1 Container by mouth once.     predniSONE (DELTASONE) 5 MG tablet Take 1 tablet (5 mg  total) by mouth in the morning and at bedtime. 60 tablet 11   Royal Jelly-Bee Pollen-Ginseng (KOREAN GINSENG COMPLEX) 150-250-50 MG CAPS Take 1 capsule by mouth daily.     senna (SENOKOT) 8.6 MG TABS tablet Take 4 tablets by mouth 2 (two) times daily as needed for moderate constipation.     Zoledronic Acid (ZOMETA) 4 MG/100ML IVPB Inject 4 mg into the vein every 3 (three) months.     No current facility-administered medications for this visit.     I,Jasmine M Lassiter,acting as a scribe for [name], MD.,have documented all relevant documentation on the behalf of [name], MD,as directed by  [name], MD while in the presence of [name], MD.

## 2022-05-25 ENCOUNTER — Other Ambulatory Visit: Payer: Self-pay | Admitting: Oncology

## 2022-05-25 ENCOUNTER — Encounter: Payer: Self-pay | Admitting: Oncology

## 2022-05-25 DIAGNOSIS — T451X5A Adverse effect of antineoplastic and immunosuppressive drugs, initial encounter: Secondary | ICD-10-CM

## 2022-05-26 ENCOUNTER — Ambulatory Visit: Payer: Non-veteran care

## 2022-05-28 ENCOUNTER — Ambulatory Visit: Payer: Non-veteran care

## 2022-05-28 ENCOUNTER — Encounter: Payer: Self-pay | Admitting: Oncology

## 2022-05-28 NOTE — Progress Notes (Signed)
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 He has been on Enzalutamide 160 mg daily, as well as leuprolide 22.5 mg, and zoledronic acid every 3 months.  His pain is controlled with MS Contin 15 mg twice daily with occasional Tylenol for breakthrough.   He was on Enzalutamide since June 2018.  He then had a steadily rising PSA, up to 83.49 in November 2022.  PSMA PET in October 2022 revealed an oligometastasis skeletal lesion it in the anterior right 7th rib, with increased activity, and very subtle sclerotic change at T10, but no evidence of progressive disease.The PSA has continued to fluctuate up and down.  It was down to 31 in February of 2023, then back up to 78 in May.  It then increased dramatically to 334 in August and he was scheduled for a PSMA PET scan.  This revealed severe progression of disease with several tiny lung nodules which did have hypermetabolic activity, with SUV up to 5.  He had increased bone lesions which are now innumerable, including major lesions at L3, the right iliac wing, and the left pubic bone. These were not visible on CT scan. He stopped the Enzalutamide and started the Taxotere chemotherapy in September 2023 and is tolerating this well. His PSA went down from 536 to 400.75 after 3 cycles and went down to 284 with the 5th cycle, but then back up to 406 with his 6th cycle. In February his PSA went down to 235 and then 203 in March, 2024.  Secondary malignant neoplasm of bone and bone marrow (HCC) He continues zoledronic acid every 3 months in addition to his cancer therapy. He had marked progression of his bone metastases in August 2023, but minimal symptoms.  Anemia. He now had melena in  March 2024 and so definitely blood loss, probably from an upper GI location.  He was hospitalized and transfused.  At the time of discharge on March 12, his hemoglobin was up to 8.8.  He will be referred to Dr. [name]. Fortunately he just had the one episode and his hemoglobin has come up to 11.1.   Leukocytosis This was due to the Neulasta injection and his white count is 10.0 today.    Plan:  He was recently hospitalized on 05/24/2022 due to a drop in his hemoglobin from 12.1 to 7.0. He was transfused with 2 units of packed red blood cells and placed on Protonix 40mg  BID.  His aspirin was stopped but he was kept on Plavix.  His hemoglobin was up to 8.8 before discharge.  his hemoglobin is 11 at today's visit. He denies dark/blood in his stool. His chemotherapy is postponed until he is stable. His chemistires are still pending and his PSA was down from 235 to 203.4 last week. We will resume treatment in 2 weeks if all is well. We discussed that we will continue the Taxotere as long as his PSA is decreasing and he has no significant toxicities. We will decide month by month.  I will see him back in 2 weeks with CBC, CMP, and PSA.  The patient understands the plans discussed today and is in agreement with them.  He knows to contact our office if he develops concerns prior to his next  appointment.  His questions were answered.   I provided 20 minutes of face-to-face time during this encounter and > 50% was spent counseling as documented under my assessment and plan.      [name], MD  [facility] [address] [place] [address] Dept: [phone] Dept Fax: [phone]   No orders of the defined types were placed in this encounter.  I have reviewed this report as typed by the medical scribe, and it is complete and accurate.  [name]   06/25/22  5:52 PM     CHIEF COMPLAINT:  CC: Metastatic  prostate cancer to bone, progressive  Current Treatment: Leuprolide/zoledronic acid and docetaxel  HISTORY OF PRESENT ILLNESS:  Marvin Johnson is a 72 year old male with a history of stage IV (T3b N1 M0) prostate cancer diagnosed in January 2016 when he was found to have a PSA of 51.8.  He was treated with a laparoscopic prostatectomy in May 2016.  Pathology revealed adenocarcinoma with a Gleason 8.  There was a positive posterior margin with extraprostatic extension, as well as left retro-urethral tissue positive and extension to the seminal vesicles bilaterally with multiple positive margins.  Two lymph nodes from the right side were negative, but 2/4 lymph nodes from the left side were positive for metastasis. Repeat bone scan in June 2016 revealed a new lesion at T10, which was not present on his baseline bone scan.  PSA at Dr. Cindee Salt office in early August 2016 remained elevated at 56.4.   We began seeing him in August 2016. The PSA was up to 216.3 in late August 2016, so hormonal therapy was recommended  X-rays of the thoracic spine revealed osteoarthritis and scoliosis, as well as evidence of demineralization, but we could not visualize a metastatic lesion.[name] He started leuprolide in September 2016.  The PSA initially dropped steadily with leuprolide.  Bone density scan in November 2016 revealed osteopenia, with a T-score of -2.1 in the spine and a T-score of -0.9 in the femur.  He had been taking vitamin-D 1000 international units daily, but not calcium, so calcium 600 mg twice daily was added at that time.    Leuprolide was held in February 2017 because of chest pain.  EKG was unremarkable.  He went to the [place] and he was referred to the [facility] for placement of 2 coronary artery stents for 95% and 99% occlusions.  He then had a third coronary artery stent placed later in the summer.  Leuprolide was resumed in March 2017.  In September 2017, he had an increase in the PSA, so we repeated a bone  scan.  This revealed intense uptake within the right posterior elements of T10, but no other areas of metastasis.  We then added bicalutamide 50 mg daily to his leuprolide.     He had a steadily increasing PSA despite the bicalutamide, so this was discontinued in March 2018.  He eventually was placed on MS Contin 15 mg twice daily, with oxycodone 10 mg every 4 hours as needed for breakthrough pain.  MRI thoracic spine in April 2018 revealed increasing tumor at T10 with epidural spread.  Bone scan at that time revealed increased activity in the T10 lesion, which was stable.  There was a questionable tiny focus in the right ischium/acetabular rim.  Plain x-rays of the bilateral hips and pelvis did not reveal any focal abnormality. The PSA increased from 27.5 in March, to  72.2 in May, then to 92.2 in June 2018.  He was referred for radiotherapy to the T10 lesion due to the severe pain with improvement in his pain. He was started on zoledronic acid along with his leuprolide in June 2018.  He had a Port-A-Cath placed in anticipation of chemotherapy, but then we recommended that he be placed on enzalutamide 160 mg daily instead, in order to delay the time to initiation of chemotherapy.  He started enzalutamide in June 2018.  The PSA became undetectable in October 2018.  We continued MS Contin, but discontinued the oxycodone. He did not tolerate meloxicam, so uses Tylenol as needed for breakthrough pain.     He had a stress test in January 2020 and was referred to [facility] for coronary artery stenting, which was done in February.  Zoledronic acid had been given monthly for over 2 years, so was changed to every 3 months in October 2020.   The PSA started to increase in January 2021 at which time it was 0.2.  CT chest, abdomen and pelvis in March 2021 not reveal any lymphadenopathy or soft tissue metastatic disease.  Hepatic steatosis was seen.  There was a stable sclerotic osseous lesion of the T10  vertebral body, as well as a subtle sclerotic lesion of the L3 vertebral body, also possibly a small metastatic lesion.  Whole body bone scan did not reveal continued increased activity of T10 and right acetabulum posteriorly.  The PSA was 0.5 in March.  PSA had increased to 3.0 in June.  Axumin PET imaging from July did not reveal any evidence of active prostate carcinoma, local recurrence, or metastatic disease. The PSA continued to slowly rise and was up to 5.9 in September, 10.9 in October, and 16.3 in December.     The PSA went up to 23.81 in March 2022, so an Axumin PET scan was ordered which revealed an oligometastasis skeletal lesion in the anterior right 7th rib with very subtle sclerotic change at this level. PSA in June decreased to 18.6, but then in September increased to 58.  In view of the significant rise in his PSA, a prostate specific PET scan was ordered.  PSMA PET in October 2022 revealed a persistent focus of intense radiotracer activity in the anterior RIGHT seventh rib with increased in activity from comparison exam and subtle sclerotic changes on CT. Findings remain consistent with oligometastatic skeletal metastasis. Sclerotic lesions in the T10 vertebral body without radiotracer activity potentially represented prior treated prostate cancer metastasis.  We recommended he continue his current treatment with enzalutamide, leuprolide and zoledronic acid.  PSA in November continued to increase to 83.49.  Even though his PSA continued to steadily increase, we did not recommend a change in therapy based on this alone.  He had no severe pain and no increase in pain and was having a good quality of life.  The PSA has continued to fluctuate up and down and was 31 in February and 78 in May.   Oncology History  Malignant neoplasm of prostate  03/30/2014 Cancer Staging   Staging form: Prostate, AJCC 8th Edition - Clinical stage from 03/30/2014: Stage IVA (cT3b, cN1, cM0, PSA: 51.8, Grade Group:  4) - Signed by [name], MD on 11/14/2020 Histopathologic type: Adenocarcinoma, NOS Stage prefix: Initial diagnosis Prostate specific antigen (PSA) range: 20 or greater Gleason primary pattern: 4 Gleason secondary pattern: 4 Gleason score: 8 Histologic grading system: 5 grade system Location of positive needle core biopsies:  Beyond primary site Prognostic indicators: May 2016 Robotic prostatectomy with mult. Pos margins, extra prostatic extension, 2/6 pos nodes. Scan June 2016 positive at T10  PSA up to 216.3 by August Placed on lupron and casodex Stage used in treatment planning: Yes National guidelines used in treatment planning: Yes Type of national guideline used in treatment planning: NCCN   02/18/2020 Initial Diagnosis   Malignant neoplasm of prostate (HCC)   12/01/2021 -  Chemotherapy   Patient is on Treatment Plan : PROSTATE Docetaxel (75) + Prednisone q21d     Secondary malignant neoplasm of bone and bone marrow  02/18/2020 Initial Diagnosis   Secondary malignant neoplasm of bone and bone marrow (HCC)   12/01/2021 -  Chemotherapy   Patient is on Treatment Plan : PROSTATE Docetaxel (75) + Prednisone q21d         INTERVAL HISTORY:  Marvin Johnson is here today for repeat clinical assessment for progressive metastatic prostate cancer to bone. Patient was admitted into the hospital on 05/24/2022 due to a hemoglobin drop from 12.1 to 7.0 with an episode of melena. There wasn't a GI consultation available so I have referred him to Dr. [name] as an outpatient, and he will have an appointment scheduled soon. He was given 2 units of packed red blood cells and placed on Protonix 40mg  BID before discharge. His hemoglobin was up at 8.8 before discharge.  His aspirin was stopped but he was kept on Plavix in view of cardiac stents.  Patient states that he feels much better and his hemoglobin is at 11 at today's visit. He denies dark/blood in his stool. His chemotherapy is postponed  until he is stable. His chemistires are still pending and his PSA was down from 235 to 203.4 last week. We will resume treatment in 2 weeks. I will see him back in 2 weeks with CBC, CMP, and PSA. He denies signs of infection such as sore throat, sinus drainage, cough, or urinary symptoms.  He denies fevers or recurrent chills. He denies pain. He denies nausea, vomiting, chest pain, dyspnea or cough. His appetite is good and his weight has decreased 2 pounds over last month .  REVIEW OF SYSTEMS:  Review of Systems  Constitutional: Negative.  Negative for appetite change, chills, diaphoresis, fatigue, fever and unexpected weight change.  HENT:  Negative.  Negative for hearing loss, lump/mass, mouth sores, nosebleeds, sore throat, tinnitus, trouble swallowing and voice change.   Eyes: Negative.  Negative for eye problems and icterus.  Respiratory: Negative.  Negative for chest tightness, cough, hemoptysis, shortness of breath and wheezing.   Cardiovascular: Negative.  Negative for chest pain, leg swelling and palpitations.  Gastrointestinal: Negative.  Negative for abdominal distention, abdominal pain, blood in stool, constipation, diarrhea, nausea, rectal pain and vomiting.  Endocrine: Negative.  Negative for hot flashes.  Genitourinary: Negative.  Negative for bladder incontinence, difficulty urinating, dyspareunia, dysuria, frequency, hematuria, nocturia, pelvic pain and penile discharge.   Musculoskeletal:  Positive for arthralgias. Negative for back pain, flank pain, gait problem, myalgias, neck pain and neck stiffness.  Skin: Negative.  Negative for itching, rash and wound.  Neurological: Negative.  Negative for dizziness, extremity weakness, gait problem, headaches, light-headedness, numbness, seizures and speech difficulty.  Hematological: Negative.  Negative for adenopathy. Does not bruise/bleed easily.  Psychiatric/Behavioral: Negative.  Negative for confusion, decreased concentration,  depression, sleep disturbance and suicidal ideas. The patient is not nervous/anxious.      VITALS:  Blood pressure 126/73, pulse 67, temperature 98 F (36.7 C),  temperature source Oral, resp. rate 19, height 5' 5.5" (1.664 m), weight 192 lb 9.6 oz (87.4 kg), SpO2 98 %.  Wt Readings from Last 3 Encounters:  06/11/22 193 lb 1.6 oz (87.6 kg)  06/02/22 192 lb 9.6 oz (87.4 kg)  05/24/22 194 lb 1.6 oz (88 kg)    Body mass index is 31.56 kg/m.  Performance status (ECOG): 1 - Symptomatic but completely ambulatory  PHYSICAL EXAM:  Physical Exam Vitals and nursing note reviewed.  Constitutional:      General: He is not in acute distress.    Appearance: Normal appearance. He is normal weight. He is not ill-appearing, toxic-appearing or diaphoretic.  HENT:     Head: Normocephalic and atraumatic.     Right Ear: Tympanic membrane, ear canal and external ear normal. There is no impacted cerumen.     Left Ear: Tympanic membrane, ear canal and external ear normal. There is no impacted cerumen.     Nose: Nose normal. No congestion or rhinorrhea.     Mouth/Throat:     Mouth: Mucous membranes are moist.     Pharynx: Oropharynx is clear. No oropharyngeal exudate or posterior oropharyngeal erythema.  Eyes:     General: No scleral icterus.       Left eye: No discharge.     Extraocular Movements: Extraocular movements intact.     Conjunctiva/sclera: Conjunctivae normal.     Pupils: Pupils are equal, round, and reactive to light.  Neck:     Vascular: No carotid bruit.  Cardiovascular:     Rate and Rhythm: Normal rate and regular rhythm.     Pulses: Normal pulses.     Heart sounds: Normal heart sounds. No murmur heard.    No friction rub. No gallop.  Pulmonary:     Effort: Pulmonary effort is normal. No respiratory distress.     Breath sounds: Normal breath sounds. No stridor. No wheezing, rhonchi or rales.  Chest:     Chest wall: No tenderness.  Abdominal:     General: Bowel sounds are  normal. There is no distension.     Palpations: Abdomen is soft. There is no hepatomegaly, splenomegaly or mass.     Tenderness: There is no abdominal tenderness. There is no right CVA tenderness, left CVA tenderness, guarding or rebound.     Hernia: No hernia is present.  Musculoskeletal:        General: No swelling, tenderness, deformity or signs of injury. Normal range of motion.     Cervical back: Normal range of motion and neck supple. No rigidity or tenderness.     Right lower leg: No edema.     Left lower leg: No edema.  Lymphadenopathy:     Cervical: No cervical adenopathy.     Upper Body:     Right upper body: No supraclavicular or axillary adenopathy.     Left upper body: No supraclavicular or axillary adenopathy.     Lower Body: No right inguinal adenopathy. No left inguinal adenopathy.  Skin:    General: Skin is warm and dry.     Coloration: Skin is not jaundiced or pale.     Findings: No bruising, erythema, lesion or rash.  Neurological:     Mental Status: He is alert and oriented to person, place, and time.     Cranial Nerves: No cranial nerve deficit.     Sensory: No sensory deficit.     Motor: No weakness.     Coordination: Coordination normal.  Gait: Gait normal.     Deep Tendon Reflexes: Reflexes normal.  Psychiatric:        Mood and Affect: Mood normal.        Behavior: Behavior normal.        Thought Content: Thought content normal.        Judgment: Judgment normal.    LABS:       Latest Ref Rng & Units 06/11/2022   12:00 AM 05/24/2022   12:00 AM 05/03/2022   12:00 AM  CBC  WBC  12.9     27.2     12.9      Hemoglobin 13.5 - 17.5 11.3     7.0     12.3      Hematocrit 41 - 53 35     22     38      Platelets 150 - 400 K/uL 287     419     297         This result is from an external source.      Latest Ref Rng & Units 06/11/2022    8:05 AM 05/24/2022   12:00 AM 05/03/2022   12:00 AM  CMP  Glucose 70 - 99 mg/dL 95     BUN 8 - 23 mg/dL 19  15     16        Creatinine 0.61 - 1.24 mg/dL 4.09  1.0     0.7      Sodium 135 - 145 mmol/L 139  140     139      Potassium 3.5 - 5.1 mmol/L 4.3  4.3     3.5      Chloride 98 - 111 mmol/L 103  106     103      CO2 22 - 32 mmol/L 26  28     28       Calcium 8.9 - 10.3 mg/dL 9.2  9.3     9.3      Total Protein 6.5 - 8.1 g/dL 7.4     Total Bilirubin 0.3 - 1.2 mg/dL 0.5     Alkaline Phos 38 - 126 U/L 54  76     57      AST 15 - 41 U/L 20  125     29      ALT 0 - 44 U/L 19  24     28          This result is from an external source.   Component Ref Range & Units 4 d ago (05/24/22) 3 wk ago (05/03/22) 1 mo ago (04/02/22) 2 mo ago (03/12/22) 3 mo ago (02/19/22) 3 mo ago (01/29/22) 4 mo ago (01/08/22)  Prostatic Specific Antigen 0.00 - 4.00 ng/mL 203.44 High  235.00 High  CM 406.00 High  CM 284.59 High  CM 403.39 High  CM 400.75 High  CM 536.11 High  CM    No results found for: "CEA1", "CEA" / No results found for: "CEA1", "CEA" Lab Results  Component Value Date   PSA1 334.0 (H) 10/30/2021    No results found for: "WJX914" No results found for: "CAN125"  No results found for: "TOTALPROTELP", "ALBUMINELP", "A1GS", "A2GS", "BETS", "BETA2SER", "GAMS", "MSPIKE", "SPEI" Lab Results  Component Value Date   TIBC 398 01/29/2022   FERRITIN 206 01/29/2022   IRONPCTSAT 25 01/29/2022   No results found for: "LDH"  STUDIES:  Exam: 04/17/2022 CT abdomen and Pelvis without Contrast Impression: Decreased sensitivity  exam secondary to lack of contrast and minimal motion degradation. Fluid-filled colon, suggesting a diarrheal state. No other explanation for patient's acute symptoms. Prostatectomy. Sclerotic lesions including within the anterior seventh right rib and right iliac are suspicious for progressive osseous metastasis. No findings of extraosseous metastatic disease within the abdomen or pelvis. New lingular nodules up to 7mm are indeterminate. Consider non-emergent, outpatient chest CT to evaluate  for pulmonary metastasis. Small bowel non rotation. Hepatic Steatosis.   Exam: 02/08/2022 Chest 2 view Impression: No acute abnormality.  Exam: 11/20/2021 NM PET (PSMA) SKULL TO MID THIGH Impression:  CLINICAL DATA:  Prostate carcinoma with biochemical recurrence. EXAM: NUCLEAR MEDICINE PET SKULL BASE TO THIGH TECHNIQUE: 9.4 mCi F18 Piflufolastat (Pylarify) was injected intravenously. Full-ring PET imaging was performed from the skull base to thigh after the radiotracer. CT data was obtained and used for attenuation correction and anatomic localization. COMPARISON:  12/24/2020 FINDINGS: NECK No radiotracer activity in neck lymph nodes. Incidental CT finding: None. CHEST No tracer avid supraclavicular, axillary, mediastinal, or hilar lymph nodes. Interval development of multiple small tracer pulmonary nodules which are concerning for metastatic disease. These nodules are all very small measuring (no more than 5 mm) and are technically too small to characterize by PET-CT. However a few of these nodules are tracer avid including: -Posteromedial right upper lobe nodule measures 5 mm with SUV max of 4.89, image 81/4. -Nodule within the inferior lingula measures 5 mm within SUV max of 5.1, image 94/4. -Nodule within the medial right apex measures 3 mm with SUV max of 3.18, image 65/4. Incidental CT finding: Aortic atherosclerosis and multi vessel coronary artery calcifications. ABDOMEN/PELVIS Prostate: No focal activity in the prostate bed. Lymph nodes: No abnormal radiotracer accumulation within pelvic or abdominal nodes. Liver: No evidence of liver metastasis. Incidental CT finding: None. SKELETON There is been marked interval progression of tracer avid bone metastases. Innumerable tracer avid bone lesions are now identified compared with solitary tracer avid bone metastases noted on the previous exam. New index lesions include: -FDG avid lesion within the L3 vertebral body with SUV max of 36.52, image  139/4. No corresponding CT changes identified. -Tracer avid bone lesion within the right iliac wing has an SUV max of 34.4, image 165/4. No corresponding CT changes identified. -parasymphyseal lesion within the left pubic bone has an SUV max of 42.15, image 191/4. No corresponding CT changes noted. -Previous tracer avid sclerotic bone lesion involving the right seventh rib has an SUV max of 27.67 on today's study, image 110/4. Previously 27.6 on the previous exam. IMPRESSION: 1. Marked interval progression tracer avid bone metastases. 2. Interval development of multiple tiny lung nodules. Several of these nodules are tracer avid and are worrisome for pulmonary metastases. 3. Aortic Atherosclerosis (ICD10-I70.0). Coronary artery calcifications. Electronically Signed   By: Signa Kell M.D.   On: 11/23/2021 10:50     HISTORY:   Past Medical History:  Diagnosis Date   Hyperlipidemia    Malignant neoplasm of prostate (HCC)    Supraventricular tachycardia     Past Surgical History:  Procedure Laterality Date   CORONARY STENT INTERVENTION N/A 02/08/2022   Procedure: CORONARY STENT INTERVENTION;  Surgeon: Yvonne Kendall, MD;  Location: [facility];  Service: Cardiovascular;  Laterality: N/A;   LEFT HEART CATH AND CORONARY ANGIOGRAPHY N/A 02/08/2022   Procedure: LEFT HEART CATH AND CORONARY ANGIOGRAPHY;  Surgeon: Yvonne Kendall, MD;  Location: [facility];  Service: Cardiovascular;  Laterality: N/A;   PROSTATECTOMY  07/2014  TONSILLECTOMY      Family History  Problem Relation Age of Onset   Breast cancer Mother 57   Ovarian cancer Mother 46   Breast cancer Paternal Grandmother 55    Social History:  reports that he has never smoked. He has never used smokeless tobacco. He reports that he does not currently use alcohol. He reports that he does not use drugs.The patient is alone today.  Allergies:  Allergies  Allergen Reactions   Atorvastatin    Rosuvastatin Other (See  Comments)    Other reaction(s): Muscle pain   Simvastatin Other (See Comments)    Other reaction(s): Joint pain, Muscle pain   Sulfa Antibiotics    Sulfamethoxazole-Trimethoprim Other (See Comments)    Unknown as to interaction was a baby.    Current Medications: Current Outpatient Medications  Medication Sig Dispense Refill   Alirocumab (PRALUENT Luis M. Cintron) Inject into the skin. One injection every 2 weeks Monday     APPLE CIDER VINEGAR PO Take by mouth.     Cholecalciferol 25 MCG (1000 UT) tablet Take 1,000 Units by mouth in the morning and at bedtime.     clopidogrel (PLAVIX) 75 MG tablet Take 75 mg by mouth daily.     dexamethasone (DECADRON) 4 MG tablet Take 8 mg by mouth See admin instructions. Take two tablets by mouth twice a day (start the day before chemo, then take daily for 2 days starting te day after chemo; take with food)     diphenhydrAMINE (BENADRYL) 50 MG capsule Take 50 mg by mouth at bedtime as needed.     ezetimibe (ZETIA) 10 MG tablet Take 10 mg by mouth daily.     isosorbide mononitrate (IMDUR) 30 MG 24 hr tablet Take 1 tablet by mouth every 8 (eight) hours as needed.     leuprolide (LUPRON) 22.5 MG injection Inject 22.5 mg into the muscle every 3 (three) months.     metoprolol tartrate (LOPRESSOR) 25 MG tablet Take 12.5 mg by mouth 2 (two) times daily.     morphine (MS CONTIN) 15 MG 12 hr tablet Take 1 tablet (15 mg total) by mouth every 12 (twelve) hours. 60 tablet 0   Multiple Vitamins-Minerals (MULTIVITAMIN WITH MINERALS) tablet Take 1 tablet by mouth daily.     nitroGLYCERIN (NITROSTAT) 0.4 MG SL tablet Place 0.4 mg under the tongue every 5 (five) minutes as needed for chest pain. Repeat every 5 minutes if needed for a total of 3 tablets in 15 minutes. If no relief, CALL 911.     Omega-3 Fatty Acids (FISH OIL) 1000 MG CAPS Take 2,000 mg by mouth in the morning and at bedtime.     pantoprazole (PROTONIX) 40 MG tablet Take 1 tablet (40 mg total) by mouth 2 (two) times  daily. 60 tablet 5   polyethylene glycol powder (GLYCOLAX/MIRALAX) 17 GM/SCOOP powder Take 1 Container by mouth once.     predniSONE (DELTASONE) 5 MG tablet Take 1 tablet (5 mg total) by mouth in the morning and at bedtime. 60 tablet 11   Royal Jelly-Bee Pollen-Ginseng (KOREAN GINSENG COMPLEX) 150-250-50 MG CAPS Take 1 capsule by mouth daily.     Zoledronic Acid (ZOMETA) 4 MG/100ML IVPB Inject 4 mg into the vein every 3 (three) months.     No current facility-administered medications for this visit.     I,Jasmine M Lassiter,acting as a scribe for [name], MD.,have documented all relevant documentation on the behalf of [name], MD,as directed by  Wynona Canes  Holland Falling, MD while in the presence of [name], MD.

## 2022-05-31 ENCOUNTER — Other Ambulatory Visit: Payer: Self-pay

## 2022-05-31 ENCOUNTER — Other Ambulatory Visit: Payer: Self-pay | Admitting: Oncology

## 2022-05-31 DIAGNOSIS — M898X9 Other specified disorders of bone, unspecified site: Secondary | ICD-10-CM

## 2022-05-31 DIAGNOSIS — C7951 Secondary malignant neoplasm of bone: Secondary | ICD-10-CM

## 2022-05-31 DIAGNOSIS — C61 Malignant neoplasm of prostate: Secondary | ICD-10-CM

## 2022-05-31 MED ORDER — MORPHINE SULFATE ER 15 MG PO TBCR: 15.0000 mg | EXTENDED_RELEASE_TABLET | Freq: Two times a day (BID) | ORAL | 0 refills | Status: DC

## 2022-06-02 ENCOUNTER — Encounter: Payer: Self-pay | Admitting: Oncology

## 2022-06-02 ENCOUNTER — Inpatient Hospital Stay (INDEPENDENT_AMBULATORY_CARE_PROVIDER_SITE_OTHER): Payer: No Typology Code available for payment source | Admitting: Oncology

## 2022-06-02 ENCOUNTER — Inpatient Hospital Stay: Payer: No Typology Code available for payment source

## 2022-06-02 VITALS — BP 126/73 | HR 67 | Temp 98.0°F | Resp 19 | Ht 65.5 in | Wt 192.6 lb

## 2022-06-02 DIAGNOSIS — C7952 Secondary malignant neoplasm of bone marrow: Secondary | ICD-10-CM

## 2022-06-02 DIAGNOSIS — C61 Malignant neoplasm of prostate: Secondary | ICD-10-CM

## 2022-06-02 DIAGNOSIS — D61810 Antineoplastic chemotherapy induced pancytopenia: Secondary | ICD-10-CM | POA: Diagnosis not present

## 2022-06-02 DIAGNOSIS — T451X5A Adverse effect of antineoplastic and immunosuppressive drugs, initial encounter: Secondary | ICD-10-CM

## 2022-06-02 DIAGNOSIS — C7800 Secondary malignant neoplasm of unspecified lung: Secondary | ICD-10-CM

## 2022-06-02 DIAGNOSIS — C7951 Secondary malignant neoplasm of bone: Secondary | ICD-10-CM | POA: Diagnosis not present

## 2022-06-02 LAB — SAMPLE TO BLOOD BANK

## 2022-06-09 NOTE — Progress Notes (Signed)
[facility]  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 He has been on Enzalutamide 160 mg daily, as well as leuprolide 22.5 mg, and zoledronic acid every 3 months.  His pain is controlled with MS Contin 15 mg twice daily with occasional Tylenol for breakthrough.   He was on Enzalutamide since June 2018.  He then had a steadily rising PSA, up to 83.49 in November 2022.  PSMA PET in October 2022 revealed an oligometastasis skeletal lesion it in the anterior right 7th rib, with increased activity, and very subtle sclerotic change at T10, but no evidence of progressive disease.The PSA has continued to fluctuate up and down.  It was down to 31 in February of 2023, then back up to 78 in May.  It then increased dramatically to 334 in August and he was scheduled for a PSMA PET scan.  This revealed severe progression of disease with several tiny lung nodules which did have hypermetabolic activity, with SUV up to 5.  He had increased bone lesions which are now innumerable, including major lesions at L3, the right iliac wing, and the left pubic bone. These were not visible on CT scan. He stopped the Enzalutamide and started the Taxotere chemotherapy in September 2023 and is tolerating this well. His PSA went down from 536 to 400.75 after 3 cycles and went down to 284 with the 5th cycle, but then back up to 406 with his 6th cycle. In February his PSA went down to 235 and then 203 in March, 2024. Therefore we will plan to continue the Taxotere unless he has significant toxicities.    Secondary malignant neoplasm of bone and bone marrow (HCC) He continues zoledronic acid every 3 months in addition to his cancer therapy. He had marked progression of his bone  metastases in August of 2023, but minimal symptoms.   Anemia. He had an upper GI hemorrhage earlier this month and was referred to Dr. [name]. Fortunately he just had the one episode and was transfused. His hemoglobin came up to 11.1 and his hemoglobin remains stable at 11.3. They plan EGD in April after being cleared by the cardiologist.   Leukocytosis This was due to the Neulasta injection and his white count 11.9 today.    Plan:  His hemoglobin today is at 11.3 and he has had no further bleeding. However, he will see Dr. [name] for EGD 07/09/2022 after he is cleared by the cardiologist. His last PSA continued to improve down to 203, so we plan to continue Taxotere once her Is stabilized from his GI bleed. He does have a skin lesion of the upper left chest, and I recommended lotrimin antifungal cream 3 times a day. I will see him back in 1 month with CBC, CMP, and PSA and schedule him for treatment of Taxotere a few days later if his counts are within range. The patient understands the plans discussed today and is in agreement with them.  He knows to contact our office if he develops concerns prior to his next appointment.  His questions were answered.     I provided 15 minutes of face-to-face time during this encounter and > 50% was spent counseling as documented under my assessment and plan.    [name], MD  [facility] [address] [place] [address] [place] [address] Dept: [phone] Dept Fax: [phone]   No orders of the defined types were placed in this encounter.  I have reviewed this report as typed by the medical scribe, and it is complete and accurate.  [name]   07/05/22  9:38 AM     CHIEF COMPLAINT:  CC: Metastatic prostate cancer to bone, progressive  Current Treatment: Leuprolide/zoledronic acid and docetaxel  HISTORY OF PRESENT ILLNESS:  Rolen Conger is a 72 year old  male with a history of stage IV (T3b N1 M0) prostate cancer diagnosed in January 2016 when he was found to have a PSA of 51.8.  He was treated with a laparoscopic prostatectomy in May 2016.  Pathology revealed adenocarcinoma with a Gleason 8.  There was a positive posterior margin with extraprostatic extension, as well as left retro-urethral tissue positive and extension to the seminal vesicles bilaterally with multiple positive margins.  Two lymph nodes from the right side were negative, but 2/4 lymph nodes from the left side were positive for metastasis. Repeat bone scan in June 2016 revealed a new lesion at T10, which was not present on his baseline bone scan.  PSA at Dr. Cindee Salt office in early August 2016 remained elevated at 56.4.   We began seeing him in August 2016. The PSA was up to 216.3 in late August 2016, so hormonal therapy was recommended  X-rays of the thoracic spine revealed osteoarthritis and scoliosis, as well as evidence of demineralization, but we could not visualize a metastatic lesion.[name] He started leuprolide in September 2016.  The PSA initially dropped steadily with leuprolide.  Bone density scan in November 2016 revealed osteopenia, with a T-score of -2.1 in the spine and a T-score of -0.9 in the femur.  He had been taking vitamin-D 1000 international units daily, but not calcium, so calcium 600 mg twice daily was added at that time.    Leuprolide was held in February 2017 because of chest pain.  EKG was unremarkable.  He went to the [place] and he was referred to the [facility] for placement of 2 coronary artery stents for 95% and 99% occlusions.  He then had a third coronary artery stent placed later in the summer.  Leuprolide was resumed in March 2017.  In September 2017, he had an increase in the PSA, so we repeated a bone scan.  This revealed intense uptake within the right posterior elements of T10, but no other areas of metastasis.  We then added bicalutamide 50 mg daily to his  leuprolide.     He had a steadily increasing PSA despite the bicalutamide, so this was discontinued in March 2018.  He eventually was placed on MS Contin 15 mg twice daily, with oxycodone 10 mg every 4 hours as needed for breakthrough pain.  MRI thoracic spine in April 2018 revealed increasing tumor at T10 with epidural spread.  Bone scan at that time revealed increased activity in the T10 lesion, which was stable.  There was a questionable tiny focus in the right ischium/acetabular rim.  Plain x-rays of the bilateral hips and pelvis did not reveal any focal abnormality. The PSA increased from 27.5 in March, to 72.2 in May, then to 92.2 in June 2018.  He was referred for radiotherapy to the T10 lesion due to the severe pain with improvement in his pain. He was started on zoledronic acid along with his leuprolide in June  2018.  He had a Port-A-Cath placed in anticipation of chemotherapy, but then we recommended that he be placed on enzalutamide 160 mg daily instead, in order to delay the time to initiation of chemotherapy.  He started enzalutamide in June 2018.  The PSA became undetectable in October 2018.  We continued MS Contin, but discontinued the oxycodone. He did not tolerate meloxicam, so uses Tylenol as needed for breakthrough pain.     He had a stress test in January 2020 and was referred to [facility] for coronary artery stenting, which was done in February.  Zoledronic acid had been given monthly for over 2 years, so was changed to every 3 months in October 2020.   The PSA started to increase in January 2021 at which time it was 0.2.  CT chest, abdomen and pelvis in March 2021 not reveal any lymphadenopathy or soft tissue metastatic disease.  Hepatic steatosis was seen.  There was a stable sclerotic osseous lesion of the T10 vertebral body, as well as a subtle sclerotic lesion of the L3 vertebral body, also possibly a small metastatic lesion.  Whole body bone scan did not reveal  continued increased activity of T10 and right acetabulum posteriorly.  The PSA was 0.5 in March.  PSA had increased to 3.0 in June.  Axumin PET imaging from July did not reveal any evidence of active prostate carcinoma, local recurrence, or metastatic disease. The PSA continued to slowly rise and was up to 5.9 in September, 10.9 in October, and 16.3 in December.     The PSA went up to 23.81 in March 2022, so an Axumin PET scan was ordered which revealed an oligometastasis skeletal lesion in the anterior right 7th rib with very subtle sclerotic change at this level. PSA in June decreased to 18.6, but then in September increased to 58.  In view of the significant rise in his PSA, a prostate specific PET scan was ordered.  PSMA PET in October 2022 revealed a persistent focus of intense radiotracer activity in the anterior RIGHT seventh rib with increased in activity from comparison exam and subtle sclerotic changes on CT. Findings remain consistent with oligometastatic skeletal metastasis. Sclerotic lesions in the T10 vertebral body without radiotracer activity potentially represented prior treated prostate cancer metastasis.  We recommended he continue his current treatment with enzalutamide, leuprolide and zoledronic acid.  PSA in November continued to increase to 83.49.  Even though his PSA continued to steadily increase, we did not recommend a change in therapy based on this alone.  He had no severe pain and no increase in pain and was having a good quality of life.  The PSA has continued to fluctuate up and down and was 31 in February and 78 in May.   Oncology History  Malignant neoplasm of prostate  03/30/2014 Cancer Staging   Staging form: Prostate, AJCC 8th Edition - Clinical stage from 03/30/2014: Stage IVA (cT3b, cN1, cM0, PSA: 51.8, Grade Group: 4) - Signed by [name], MD on 11/14/2020 Histopathologic type: Adenocarcinoma, NOS Stage prefix: Initial diagnosis Prostate specific antigen  (PSA) range: 20 or greater Gleason primary pattern: 4 Gleason secondary pattern: 4 Gleason score: 8 Histologic grading system: 5 grade system Location of positive needle core biopsies: Beyond primary site Prognostic indicators: May 2016 Robotic prostatectomy with mult. Pos margins, extra prostatic extension, 2/6 pos nodes. Scan June 2016 positive at T10  PSA up to 216.3 by August Placed on lupron and casodex Stage used in treatment  planning: Yes National guidelines used in treatment planning: Yes Type of national guideline used in treatment planning: NCCN   02/18/2020 Initial Diagnosis   Malignant neoplasm of prostate (HCC)   12/01/2021 -  Chemotherapy   Patient is on Treatment Plan : PROSTATE Docetaxel (75) + Prednisone q21d     Secondary malignant neoplasm of bone and bone marrow  02/18/2020 Initial Diagnosis   Secondary malignant neoplasm of bone and bone marrow (HCC)   12/01/2021 -  Chemotherapy   Patient is on Treatment Plan : PROSTATE Docetaxel (75) + Prednisone q21d         INTERVAL HISTORY:  Marvin Johnson is here today for repeat clinical assessment for progressive metastatic prostate cancer to bone. Patient states that he is well and only complains about fatigue. Patient informed me that he has an appointment to see Dr. [name] on 07/09/2022 but needs approval from cardiologist in [place] first to stop his Plavix prior to an EGD. His friend stated since his last transfusion that Lollie Sails has improved although he is still fatigued. His hemoglobin was up at 8.8 before discharge from his visit in the hospital on 05/24/2022 and his hemoglobin was 11 from last visit with me a week ago. His hemoglobin today is 11.3 and a WBC of 12.9 with an ANC of 8640. His WBC had been as high as 38,000 in the hospital, likely from his Neulasta injection. His last PSA continued to improve down to 203. I recommended lotrimin antifungal cream 3 times a day for what seems to be a fungal rash on the left  upper anterior chest. I will see him back in 1 month with CBC, CMP, and PSA and schedule him for treatment of Taxotere a few days later if his counts are within range and he feels well enough. He denies signs of infection such as sore throat, sinus drainage, cough, or urinary symptoms.  He denies fevers or recurrent chills. He denies blood in stool. He denies nausea, vomiting, chest pain, dyspnea or cough. His appetite is good and his weight has increased 1 pounds over last 9 days . He is accompanied at today's appointment with his friend.    REVIEW OF SYSTEMS:  Review of Systems  Constitutional:  Positive for fatigue. Negative for appetite change, chills, diaphoresis, fever and unexpected weight change.  HENT:  Negative.  Negative for hearing loss, lump/mass, mouth sores, nosebleeds, sore throat, tinnitus, trouble swallowing and voice change.   Eyes: Negative.  Negative for eye problems and icterus.  Respiratory: Negative.  Negative for chest tightness, cough, hemoptysis, shortness of breath and wheezing.   Cardiovascular: Negative.  Negative for chest pain, leg swelling and palpitations.  Gastrointestinal: Negative.  Negative for abdominal distention, abdominal pain, blood in stool, constipation, diarrhea, nausea, rectal pain and vomiting.  Endocrine: Negative.  Negative for hot flashes.  Genitourinary: Negative.  Negative for bladder incontinence, difficulty urinating, dyspareunia, dysuria, frequency, hematuria, nocturia, pelvic pain and penile discharge.   Musculoskeletal:  Positive for arthralgias. Negative for back pain, flank pain, gait problem, myalgias, neck pain and neck stiffness.  Skin: Negative.  Negative for itching, rash and wound.  Neurological: Negative.  Negative for dizziness, extremity weakness, gait problem, headaches, light-headedness, numbness, seizures and speech difficulty.  Hematological: Negative.  Negative for adenopathy. Does not bruise/bleed easily.   Psychiatric/Behavioral: Negative.  Negative for confusion, decreased concentration, depression, sleep disturbance and suicidal ideas. The patient is not nervous/anxious.      VITALS:  Blood pressure 115/70, pulse (!) 59, temperature (!)  97.5 F (36.4 C), temperature source Oral, resp. rate 18, height 5' 5.5" (1.664 m), weight 193 lb 1.6 oz (87.6 kg), SpO2 99 %.  Wt Readings from Last 3 Encounters:  07/01/22 192 lb 1.9 oz (87.1 kg)  06/29/22 190 lb (86.2 kg)  06/11/22 193 lb 1.6 oz (87.6 kg)    Body mass index is 31.64 kg/m.  Performance status (ECOG): 1 - Symptomatic but completely ambulatory  PHYSICAL EXAM:  Physical Exam Vitals and nursing note reviewed. Exam conducted with a chaperone present.  Constitutional:      General: He is not in acute distress.    Appearance: Normal appearance. He is normal weight.  HENT:     Head: Normocephalic and atraumatic.     Mouth/Throat:     Mouth: Mucous membranes are moist.     Pharynx: Oropharynx is clear. No oropharyngeal exudate or posterior oropharyngeal erythema.  Eyes:     General: No scleral icterus.    Extraocular Movements: Extraocular movements intact.     Conjunctiva/sclera: Conjunctivae normal.     Pupils: Pupils are equal, round, and reactive to light.  Cardiovascular:     Rate and Rhythm: Normal rate and regular rhythm.     Heart sounds: Normal heart sounds. No murmur heard.    No friction rub. No gallop.  Pulmonary:     Effort: Pulmonary effort is normal.     Breath sounds: Normal breath sounds. No wheezing, rhonchi or rales.  Abdominal:     General: Bowel sounds are normal. There is no distension.     Palpations: Abdomen is soft. There is no hepatomegaly, splenomegaly or mass.     Tenderness: There is no abdominal tenderness.  Musculoskeletal:        General: Normal range of motion.     Cervical back: Normal range of motion and neck supple. No tenderness.     Right lower leg: No edema.     Left lower leg: No edema.   Lymphadenopathy:     Cervical: No cervical adenopathy.     Upper Body:     Right upper body: No supraclavicular or axillary adenopathy.     Left upper body: No supraclavicular or axillary adenopathy.     Lower Body: No right inguinal adenopathy. No left inguinal adenopathy.  Skin:    General: Skin is warm and dry.     Coloration: Skin is not jaundiced.     Findings: No rash.  Neurological:     Mental Status: He is alert and oriented to person, place, and time.     Cranial Nerves: No cranial nerve deficit.  Psychiatric:        Mood and Affect: Mood normal.        Behavior: Behavior normal.        Thought Content: Thought content normal.    .phy LABS:       Latest Ref Rng & Units 06/11/2022   12:00 AM 05/24/2022   12:00 AM 05/03/2022   12:00 AM  CBC  WBC  12.9     27.2     12.9      Hemoglobin 13.5 - 17.5 11.3     7.0     12.3      Hematocrit 41 - 53 35     22     38      Platelets 150 - 400 K/uL 287     419     297         This  result is from an external source.      Latest Ref Rng & Units 06/11/2022    8:05 AM 05/24/2022   12:00 AM 05/03/2022   12:00 AM  CMP  Glucose 70 - 99 mg/dL 95     BUN 8 - 23 mg/dL 19  15     16       Creatinine 0.61 - 1.24 mg/dL 1.61  1.0     0.7      Sodium 135 - 145 mmol/L 139  140     139      Potassium 3.5 - 5.1 mmol/L 4.3  4.3     3.5      Chloride 98 - 111 mmol/L 103  106     103      CO2 22 - 32 mmol/L 26  28     28       Calcium 8.9 - 10.3 mg/dL 9.2  9.3     9.3      Total Protein 6.5 - 8.1 g/dL 7.4     Total Bilirubin 0.3 - 1.2 mg/dL 0.5     Alkaline Phos 38 - 126 U/L 54  76     57      AST 15 - 41 U/L 20  125     29      ALT 0 - 44 U/L 19  24     28          This result is from an external source.   Component Ref Range & Units 2 wk ago (05/24/22) 1 mo ago (05/03/22) 2 mo ago (04/02/22) 2 mo ago (03/12/22) 3 mo ago (02/19/22)  Prostatic Specific Antigen 0.00 - 4.00 ng/mL 203.44 High  235.00 High  CM 406.00 High  CM 284.59 High  CM  403.39 High      No results found for: "CEA1", "CEA" / No results found for: "CEA1", "CEA" Lab Results  Component Value Date   PSA1 334.0 (H) 10/30/2021    No results found for: "WRU045" No results found for: "CAN125"  No results found for: "TOTALPROTELP", "ALBUMINELP", "A1GS", "A2GS", "BETS", "BETA2SER", "GAMS", "MSPIKE", "SPEI" Lab Results  Component Value Date   TIBC 398 01/29/2022   FERRITIN 206 01/29/2022   IRONPCTSAT 25 01/29/2022   No results found for: "LDH"  STUDIES:  Exam: 04/17/2022 CT abdomen and Pelvis without Contrast Impression: Decreased sensitivity exam secondary to lack of contrast and minimal motion degradation. Fluid-filled colon, suggesting a diarrheal state. No other explanation for patient's acute symptoms. Prostatectomy. Sclerotic lesions including within the anterior seventh right rib and right iliac are suspicious for progressive osseous metastasis. No findings of extraosseous metastatic disease within the abdomen or pelvis. New lingular nodules up to 7mm are indeterminate. Consider non-emergent, outpatient chest CT to evaluate for pulmonary metastasis. Small bowel non rotation. Hepatic Steatosis.   Exam: 02/08/2022 Chest 2 view Impression: No acute abnormality.  Exam: 11/20/2021 NM PET (PSMA) SKULL TO MID THIGH Impression:  CLINICAL DATA:  Prostate carcinoma with biochemical recurrence. EXAM: NUCLEAR MEDICINE PET SKULL BASE TO THIGH TECHNIQUE: 9.4 mCi F18 Piflufolastat (Pylarify) was injected intravenously. Full-ring PET imaging was performed from the skull base to thigh after the radiotracer. CT data was obtained and used for attenuation correction and anatomic localization. COMPARISON:  12/24/2020 FINDINGS: NECK No radiotracer activity in neck lymph nodes. Incidental CT finding: None. CHEST No tracer avid supraclavicular, axillary, mediastinal, or hilar lymph nodes. Interval development of multiple small tracer pulmonary nodules which are concerning  for metastatic disease. These nodules are all very small measuring (no more than 5 mm) and are technically too small to characterize by PET-CT. However a few of these nodules are tracer avid including: -Posteromedial right upper lobe nodule measures 5 mm with SUV max of 4.89, image 81/4. -Nodule within the inferior lingula measures 5 mm within SUV max of 5.1, image 94/4. -Nodule within the medial right apex measures 3 mm with SUV max of 3.18, image 65/4. Incidental CT finding: Aortic atherosclerosis and multi vessel coronary artery calcifications. ABDOMEN/PELVIS Prostate: No focal activity in the prostate bed. Lymph nodes: No abnormal radiotracer accumulation within pelvic or abdominal nodes. Liver: No evidence of liver metastasis. Incidental CT finding: None. SKELETON There is been marked interval progression of tracer avid bone metastases. Innumerable tracer avid bone lesions are now identified compared with solitary tracer avid bone metastases noted on the previous exam. New index lesions include: -FDG avid lesion within the L3 vertebral body with SUV max of 36.52, image 139/4. No corresponding CT changes identified. -Tracer avid bone lesion within the right iliac wing has an SUV max of 34.4, image 165/4. No corresponding CT changes identified. -parasymphyseal lesion within the left pubic bone has an SUV max of 42.15, image 191/4. No corresponding CT changes noted. -Previous tracer avid sclerotic bone lesion involving the right seventh rib has an SUV max of 27.67 on today's study, image 110/4. Previously 27.6 on the previous exam. IMPRESSION: 1. Marked interval progression tracer avid bone metastases. 2. Interval development of multiple tiny lung nodules. Several of these nodules are tracer avid and are worrisome for pulmonary metastases. 3. Aortic Atherosclerosis (ICD10-I70.0). Coronary artery calcifications. Electronically Signed   By: Signa Kell M.D.   On: 11/23/2021 10:50     HISTORY:   Past Medical  History:  Diagnosis Date   Hyperlipidemia    Malignant neoplasm of prostate    Supraventricular tachycardia     Past Surgical History:  Procedure Laterality Date   CORONARY STENT INTERVENTION N/A 02/08/2022   Procedure: CORONARY STENT INTERVENTION;  Surgeon: Yvonne Kendall, MD;  Location: [facility];  Service: Cardiovascular;  Laterality: N/A;   LEFT HEART CATH AND CORONARY ANGIOGRAPHY N/A 02/08/2022   Procedure: LEFT HEART CATH AND CORONARY ANGIOGRAPHY;  Surgeon: Yvonne Kendall, MD;  Location: [facility];  Service: Cardiovascular;  Laterality: N/A;   PROSTATECTOMY  07/2014   TONSILLECTOMY      Family History  Problem Relation Age of Onset   Breast cancer Mother 60   Ovarian cancer Mother 93   Breast cancer Paternal Grandmother 26    Social History:  reports that he has never smoked. He has never used smokeless tobacco. He reports that he does not currently use alcohol. He reports that he does not use drugs.The patient is alone today.  Allergies:  Allergies  Allergen Reactions   Atorvastatin    Rosuvastatin Other (See Comments)    Other reaction(s): Muscle pain   Simvastatin Other (See Comments)    Other reaction(s): Joint pain, Muscle pain   Sulfa Antibiotics    Sulfamethoxazole-Trimethoprim Other (See Comments)    Unknown as to interaction was a baby.    Current Medications: Current Outpatient Medications  Medication Sig Dispense Refill   polyethylene glycol powder (GLYCOLAX/MIRALAX) 17 GM/SCOOP powder Take 1 Container by mouth once.     Alirocumab (PRALUENT Rosman) Inject into the skin. One injection every 2 weeks Monday     APPLE CIDER VINEGAR PO Take by mouth.  Cholecalciferol 25 MCG (1000 UT) tablet Take 1,000 Units by mouth in the morning and at bedtime.     clopidogrel (PLAVIX) 75 MG tablet Take 75 mg by mouth daily.     dexamethasone (DECADRON) 4 MG tablet Take 8 mg by mouth See admin instructions. Take two tablets by mouth twice a day (start  the day before chemo, then take daily for 2 days starting te day after chemo; take with food)     diphenhydrAMINE (BENADRYL) 50 MG capsule Take 50 mg by mouth at bedtime as needed.     ezetimibe (ZETIA) 10 MG tablet Take 10 mg by mouth daily.     isosorbide mononitrate (IMDUR) 30 MG 24 hr tablet Take 1 tablet by mouth every 8 (eight) hours as needed.     leuprolide (LUPRON) 22.5 MG injection Inject 22.5 mg into the muscle every 3 (three) months.     metoprolol tartrate (LOPRESSOR) 25 MG tablet Take 12.5 mg by mouth 2 (two) times daily.     morphine (MS CONTIN) 15 MG 12 hr tablet Take 1 tablet (15 mg total) by mouth every 12 (twelve) hours. 60 tablet 0   Multiple Vitamins-Minerals (MULTIVITAMIN WITH MINERALS) tablet Take 1 tablet by mouth daily.     nitroGLYCERIN (NITROSTAT) 0.4 MG SL tablet Place 0.4 mg under the tongue every 5 (five) minutes as needed for chest pain. Repeat every 5 minutes if needed for a total of 3 tablets in 15 minutes. If no relief, CALL 911.     Omega-3 Fatty Acids (FISH OIL) 1000 MG CAPS Take 2,000 mg by mouth in the morning and at bedtime.     pantoprazole (PROTONIX) 40 MG tablet Take 1 tablet (40 mg total) by mouth 2 (two) times daily. 60 tablet 5   predniSONE (DELTASONE) 5 MG tablet Take 1 tablet (5 mg total) by mouth in the morning and at bedtime. 60 tablet 11   Royal Jelly-Bee Pollen-Ginseng (KOREAN GINSENG COMPLEX) 150-250-50 MG CAPS Take 1 capsule by mouth daily.     Zoledronic Acid (ZOMETA) 4 MG/100ML IVPB Inject 4 mg into the vein every 3 (three) months.     No current facility-administered medications for this visit.     I,Jasmine M Lassiter,acting as a scribe for [name], MD.,have documented all relevant documentation on the behalf of [name], MD,as directed by  [name], MD while in the presence of [name], MD.

## 2022-06-10 ENCOUNTER — Telehealth: Payer: Self-pay | Admitting: *Deleted

## 2022-06-10 ENCOUNTER — Other Ambulatory Visit: Payer: Self-pay | Admitting: Oncology

## 2022-06-10 DIAGNOSIS — C61 Malignant neoplasm of prostate: Secondary | ICD-10-CM

## 2022-06-10 DIAGNOSIS — C7800 Secondary malignant neoplasm of unspecified lung: Secondary | ICD-10-CM

## 2022-06-10 NOTE — Telephone Encounter (Signed)
   Pre-operative Risk Assessment    Patient Name: Marvin Johnson  DOB: [birth year] MRN: [id number]      Request for Surgical Clearance    Procedure:   EGD  Date of Surgery:  Clearance 07/04/22                                 Surgeon:   Surgeon's Group or Practice Name:  [facility] Phone number:   Fax number:  [phone]   Type of Clearance Requested:   - Pharmacy:  Hold Aspirin and Clopidogrel (Plavix) NOT INDICATED HOW LONG   Type of Anesthesia:  Not Indicated   Additional requests/questions:    [name]   06/10/2022, 10:54 AM

## 2022-06-10 NOTE — Telephone Encounter (Signed)
   Name: Marvin Johnson  DOB: [birth year]  MRN: [id number]  Primary Cardiologist: Quay Burow, MD  Chart reviewed as part of pre-operative protocol coverage. The patient has an upcoming visit scheduled with Dr. Gwenlyn Found on 06/29/2022 at which time clearance can be addressed in case there are any issues that would impact surgical recommendations.  EGD is not scheduled until 07/04/2022 as below. I added preop FYI to appointment note so that provider is aware to address at time of outpatient visit.  Per office protocol the cardiology provider should forward their finalized clearance decision and recommendations regarding antiplatelet therapy to the requesting party below.    I will route this message as FYI to requesting party and remove this message from the preop box as separate preop APP input not needed at this time.   Please call with any questions.  Lenna Sciara, NP  06/10/2022, 11:24 AM

## 2022-06-11 ENCOUNTER — Inpatient Hospital Stay: Payer: No Typology Code available for payment source

## 2022-06-11 ENCOUNTER — Encounter: Payer: Self-pay | Admitting: Oncology

## 2022-06-11 ENCOUNTER — Telehealth: Payer: Self-pay

## 2022-06-11 ENCOUNTER — Inpatient Hospital Stay: Payer: No Typology Code available for payment source | Admitting: Oncology

## 2022-06-11 VITALS — BP 115/70 | HR 59 | Temp 97.5°F | Resp 18 | Ht 65.5 in | Wt 193.1 lb

## 2022-06-11 DIAGNOSIS — C7952 Secondary malignant neoplasm of bone marrow: Secondary | ICD-10-CM

## 2022-06-11 DIAGNOSIS — C7951 Secondary malignant neoplasm of bone: Secondary | ICD-10-CM

## 2022-06-11 DIAGNOSIS — C61 Malignant neoplasm of prostate: Secondary | ICD-10-CM | POA: Diagnosis not present

## 2022-06-11 DIAGNOSIS — C7800 Secondary malignant neoplasm of unspecified lung: Secondary | ICD-10-CM

## 2022-06-11 LAB — CBC AND DIFFERENTIAL
HCT: 35 — AB (ref 41–53)
Hemoglobin: 11.3 — AB (ref 13.5–17.5)
Neutrophils Absolute: 8.64
Platelets: 287 10*3/uL (ref 150–400)
WBC: 12.9

## 2022-06-11 LAB — SAMPLE TO BLOOD BANK

## 2022-06-11 LAB — CMP (CANCER CENTER ONLY)
ALT: 19 U/L (ref 0–44)
AST: 20 U/L (ref 15–41)
Albumin: 3.8 g/dL (ref 3.5–5.0)
Alkaline Phosphatase: 54 U/L (ref 38–126)
Anion gap: 10 (ref 5–15)
BUN: 19 mg/dL (ref 8–23)
CO2: 26 mmol/L (ref 22–32)
Calcium: 9.2 mg/dL (ref 8.9–10.3)
Chloride: 103 mmol/L (ref 98–111)
Creatinine: 0.76 mg/dL (ref 0.61–1.24)
GFR, Estimated: >60 mL/min (ref >60)
Glucose, Bld: 95 mg/dL (ref 70–99)
Potassium: 4.3 mmol/L (ref 3.5–5.1)
Sodium: 139 mmol/L (ref 135–145)
Total Bilirubin: 0.5 mg/dL (ref 0.3–1.2)
Total Protein: 7.4 g/dL (ref 6.5–8.1)

## 2022-06-11 LAB — PSA: Prostatic Specific Antigen: >150 ng/mL — ABNORMAL HIGH (ref 0.00–4.00)

## 2022-06-11 LAB — CBC: RBC: 4.11 (ref 3.87–5.11)

## 2022-06-11 NOTE — Telephone Encounter (Signed)
Marvin Johnson called to report corrected count of PSA, PSA>150. Message sent to Dr Hinton Rao.

## 2022-06-14 ENCOUNTER — Encounter: Payer: Self-pay | Admitting: Oncology

## 2022-06-14 ENCOUNTER — Telehealth: Payer: Self-pay

## 2022-06-14 NOTE — Telephone Encounter (Signed)
Pt has called to make sure the appts for this week have been cancelled. "Dr Hinton Rao putting it off until next week". I sent message to [facility], Ferne Coe, and Dr Neal Dy will receive msg tomorrow)

## 2022-06-15 ENCOUNTER — Other Ambulatory Visit: Payer: Self-pay

## 2022-06-16 ENCOUNTER — Ambulatory Visit: Payer: Non-veteran care

## 2022-06-18 ENCOUNTER — Encounter: Payer: Self-pay | Admitting: Oncology

## 2022-06-18 ENCOUNTER — Ambulatory Visit: Payer: Non-veteran care

## 2022-06-22 ENCOUNTER — Other Ambulatory Visit: Payer: Self-pay | Admitting: Oncology

## 2022-06-22 ENCOUNTER — Telehealth: Payer: Self-pay

## 2022-06-22 DIAGNOSIS — K2971 Gastritis, unspecified, with bleeding: Secondary | ICD-10-CM

## 2022-06-22 MED ORDER — PANTOPRAZOLE SODIUM 40 MG PO TBEC: 40.0000 mg | DELAYED_RELEASE_TABLET | Freq: Two times a day (BID) | ORAL | 5 refills | Status: DC

## 2022-06-22 NOTE — Telephone Encounter (Signed)
Pt called to ask if he needed to continue the pantoprazole 40mg  po BID, that was ordered by hospitalist (GI bleed). If so, he needs new prescription sent in. He was wondering if you would do refill it or not?

## 2022-06-25 ENCOUNTER — Encounter: Payer: Self-pay | Admitting: Oncology

## 2022-06-25 ENCOUNTER — Other Ambulatory Visit: Payer: Self-pay | Admitting: Oncology

## 2022-06-28 ENCOUNTER — Other Ambulatory Visit: Payer: Self-pay

## 2022-06-28 DIAGNOSIS — C7951 Secondary malignant neoplasm of bone: Secondary | ICD-10-CM

## 2022-06-28 DIAGNOSIS — M898X9 Other specified disorders of bone, unspecified site: Secondary | ICD-10-CM

## 2022-06-28 DIAGNOSIS — C61 Malignant neoplasm of prostate: Secondary | ICD-10-CM

## 2022-06-28 MED ORDER — MORPHINE SULFATE ER 15 MG PO TBCR: 15.0000 mg | EXTENDED_RELEASE_TABLET | Freq: Two times a day (BID) | ORAL | 0 refills | Status: DC

## 2022-06-29 ENCOUNTER — Encounter: Payer: Self-pay | Admitting: Cardiovascular Disease

## 2022-06-29 ENCOUNTER — Ambulatory Visit: Payer: Non-veteran care | Attending: Cardiovascular Disease | Admitting: Cardiovascular Disease

## 2022-06-29 VITALS — BP 130/70 | HR 59 | Ht 67.0 in | Wt 190.0 lb

## 2022-06-29 DIAGNOSIS — I2510 Atherosclerotic heart disease of native coronary artery without angina pectoris: Secondary | ICD-10-CM | POA: Diagnosis not present

## 2022-06-29 DIAGNOSIS — I10 Essential (primary) hypertension: Secondary | ICD-10-CM | POA: Diagnosis not present

## 2022-06-29 DIAGNOSIS — E785 Hyperlipidemia, unspecified: Secondary | ICD-10-CM | POA: Diagnosis not present

## 2022-06-29 DIAGNOSIS — I214 Non-ST elevation (NSTEMI) myocardial infarction: Secondary | ICD-10-CM

## 2022-06-29 NOTE — Assessment & Plan Note (Signed)
History of essential 10/50 130/70.  He is on metoprolol.

## 2022-06-29 NOTE — Assessment & Plan Note (Signed)
History of hyperlipidemia intolerant to statin therapy on Praluent followed by his physicians at the [facility] in [place]

## 2022-06-29 NOTE — Progress Notes (Signed)
06/29/2022 Marvin Johnson   [birth year]  [id number]  Primary Physician [name], MD Primary Cardiologist: Runell Gess MD Milagros Loll, [place], [place]  HPI:  Marvin Johnson is a 72 y.o. moderately overweight divorced Caucasian male father of 2 children, grandfather of 5 grandchildren is accompanied by his son Marvin Johnson today.  I first met him during his hospitalization 02/07/2022 when he was admitted with unstable angina/non-STEMI.  He has a history of proximal LAD, distal RCA and circumflex stenting at the [facility] in [place] in 2017.  His other problems include treated hypertension hyperlipidemia.  He underwent cardiac catheterization by Dr. Okey Dupre  revealing a patent stent in the LAD and RCA with a patent stent in the circumflex and distal AV groove circumflex disease.  He also had disease in his first obtuse marginal branch both of which were stented.  His EF was normal.  He remains on dual antibiotic therapy.  He is completely asymptomatic.   Current Meds  Medication Sig   Alirocumab (PRALUENT [place]) Inject into the skin. One injection every 2 weeks Monday   APPLE CIDER VINEGAR PO Take by mouth.   Cholecalciferol 25 MCG (1000 UT) tablet Take 1,000 Units by mouth in the morning and at bedtime.   clopidogrel (PLAVIX) 75 MG tablet Take 75 mg by mouth daily.   dexamethasone (DECADRON) 4 MG tablet Take 8 mg by mouth See admin instructions. Take two tablets by mouth twice a day (start the day before chemo, then take daily for 2 days starting te day after chemo; take with food)   diphenhydrAMINE (BENADRYL) 50 MG capsule Take 50 mg by mouth at bedtime as needed.   ezetimibe (ZETIA) 10 MG tablet Take 10 mg by mouth daily.   isosorbide mononitrate (IMDUR) 30 MG 24 hr tablet Take 1 tablet by mouth every 8 (eight) hours as needed.   leuprolide (LUPRON) 22.5 MG injection Inject 22.5 mg into the muscle every 3 (three) months.   metoprolol tartrate (LOPRESSOR) 25 MG tablet Take 12.5  mg by mouth 2 (two) times daily.   morphine (MS CONTIN) 15 MG 12 hr tablet Take 1 tablet (15 mg total) by mouth every 12 (twelve) hours.   Multiple Vitamins-Minerals (MULTIVITAMIN WITH MINERALS) tablet Take 1 tablet by mouth daily.   nitroGLYCERIN (NITROSTAT) 0.4 MG SL tablet Place 0.4 mg under the tongue every 5 (five) minutes as needed for chest pain. Repeat every 5 minutes if needed for a total of 3 tablets in 15 minutes. If no relief, CALL 911.   Omega-3 Fatty Acids (FISH OIL) 1000 MG CAPS Take 2,000 mg by mouth in the morning and at bedtime.   pantoprazole (PROTONIX) 40 MG tablet Take 1 tablet (40 mg total) by mouth 2 (two) times daily.   polyethylene glycol powder (GLYCOLAX/MIRALAX) 17 GM/SCOOP powder Take 1 Container by mouth once.   predniSONE (DELTASONE) 5 MG tablet Take 1 tablet (5 mg total) by mouth in the morning and at bedtime.   Royal Jelly-Bee Pollen-Ginseng (KOREAN GINSENG COMPLEX) 150-250-50 MG CAPS Take 1 capsule by mouth daily.   Zoledronic Acid (ZOMETA) 4 MG/100ML IVPB Inject 4 mg into the vein every 3 (three) months.     Allergies  Allergen Reactions   Atorvastatin    Rosuvastatin Other (See Comments)    Other reaction(s): Muscle pain   Simvastatin Other (See Comments)    Other reaction(s): Joint pain, Muscle pain   Sulfa Antibiotics    Sulfamethoxazole-Trimethoprim Other (See Comments)  Unknown as to interaction was a baby.    Social History   Socioeconomic History   Marital status: Divorced    Spouse name: Not on file   Number of children: Not on file   Years of education: Not on file   Highest education level: Not on file  Occupational History   Not on file  Tobacco Use   Smoking status: Never   Smokeless tobacco: Never  Substance and Sexual Activity   Alcohol use: Not Currently   Drug use: Never   Sexual activity: Not on file  Other Topics Concern   Not on file  Social History Narrative   Not on file   Social Determinants of Health    Financial Resource Strain: Not on file  Food Insecurity: No Food Insecurity (02/08/2022)   Hunger Vital Sign    Worried About Running Out of Food in the Last Year: Never true    Ran Out of Food in the Last Year: Never true  Transportation Needs: No Transportation Needs (02/08/2022)   PRAPARE - Administrator, Civil Service (Medical): No    Lack of Transportation (Non-Medical): No  Physical Activity: Not on file  Stress: Not on file  Social Connections: Not on file  Intimate Partner Violence: Not At Risk (02/08/2022)   Humiliation, Afraid, Rape, and Kick questionnaire    Fear of Current or Ex-Partner: No    Emotionally Abused: No    Physically Abused: No    Sexually Abused: No     Review of Systems: General: negative for chills, fever, night sweats or weight changes.  Cardiovascular: negative for chest pain, dyspnea on exertion, edema, orthopnea, palpitations, paroxysmal nocturnal dyspnea or shortness of breath Dermatological: negative for rash Respiratory: negative for cough or wheezing Urologic: negative for hematuria Abdominal: negative for nausea, vomiting, diarrhea, bright red blood per rectum, melena, or hematemesis Neurologic: negative for visual changes, syncope, or dizziness All other systems reviewed and are otherwise negative except as noted above.    Blood pressure 130/70, pulse (!) 59, height  (1.702 m), weight 190 lb (86.2 kg), SpO2 98 %.  General appearance: alert and no distress Neck: no adenopathy, no carotid bruit, no JVD, supple, symmetrical, trachea midline, and thyroid not enlarged, symmetric, no tenderness/mass/nodules Lungs: clear to auscultation bilaterally Heart: regular rate and rhythm, S1, S2 normal, no murmur, click, rub or gallop Extremities: extremities normal, atraumatic, no cyanosis or edema Pulses: 2+ and symmetric Skin: Skin color, texture, turgor normal. No rashes or lesions Neurologic: Grossly normal  EKG sinus  bradycardia 59 without ST or T wave changes.  Personally reviewed this EKG.  ASSESSMENT AND PLAN:   NSTEMI (non-ST elevated myocardial infarction) CAD status post DES stenting to the LAD, RCA and distal circumflex in 2017 at the [place] in [place].  He was admitted for non-STEMI 02/07/2022.  He underwent cardiac catheterization by Dr. Okey Dupre with stenting of his AV groove circumflex.  EF was normal at that time.  He is on dual antiplatelet therapy.  He is asymptomatic.  Hyperlipidemia History of hyperlipidemia intolerant to statin therapy on Praluent followed by his physicians at the [facility] in [place]  Hypertension History of essential 10/50 130/70.  He is on metoprolol.     Runell Gess MD FACP,FACC,FAHA, [facility] 06/29/2022 10:53 AM

## 2022-06-29 NOTE — Assessment & Plan Note (Signed)
CAD status post DES stenting to the LAD, RCA and distal circumflex in 2017 at the [place] in [place].  He was admitted for non-STEMI 02/07/2022.  He underwent cardiac catheterization by Dr. Okey Dupre with stenting of his AV groove circumflex.  EF was normal at that time.  He is on dual antiplatelet therapy.  He is asymptomatic.

## 2022-06-29 NOTE — Patient Instructions (Addendum)
Medication Instructions:  Your physician recommends that you continue on your current medications as directed. Please refer to the Current Medication list given to you today.  *If you need a refill on your cardiac medications before your next appointment, please call your pharmacy*   Follow-Up: At [facility], you and your health needs are our priority.  As part of our continuing mission to provide you with exceptional heart care, we have created designated Provider Care Teams.  These Care Teams include your primary Cardiologist (physician) and Advanced Practice Providers (APPs -  Physician Assistants and Nurse Practitioners) who all work together to provide you with the care you need, when you need it.  We recommend signing up for the patient portal called "MyChart".  Sign up information is provided on this After Visit Summary.  MyChart is used to connect with patients for Virtual Visits (Telemedicine).  Patients are able to view lab/test results, encounter notes, upcoming appointments, etc.  Non-urgent messages can be sent to your provider as well.   To learn more about what you can do with MyChart, go to [email or web address].    Your next appointment:   12 month(s)  Provider:   Jonathan Berry, MD    

## 2022-06-30 ENCOUNTER — Encounter: Payer: Self-pay | Admitting: Oncology

## 2022-06-30 ENCOUNTER — Telehealth: Payer: Self-pay | Admitting: *Deleted

## 2022-06-30 NOTE — Addendum Note (Signed)
Addended by: Domenic Schwab on: 06/30/2022 12:27 PM   Modules accepted: Orders

## 2022-06-30 NOTE — Telephone Encounter (Signed)
   Pre-operative Risk Assessment    Patient Name: Marvin Johnson  DOB: [birth year] MRN: [id number]      Request for Surgical Clearance    Procedure:   EGD  Date of Surgery:  Clearance 07/09/22                                 Surgeon:  Dr. Tollie Pizza Misenhelmer Surgeon's Group or Practice Name:  [facility] [place] [name] Phone number:  [phone] Fax number:  [phone]   Type of Clearance Requested:   - Medical  - Pharmacy:  Hold Clopidogrel (Plavix) 5 days prior   Type of Anesthesia:  Not Indicated   Additional requests/questions:  Pt was just seen by Dr. Allyson Sabal 06/29/22.  Will send to Everardo All, RN  Signed, Emmit Pomfret   06/30/2022, 7:41 AM

## 2022-07-01 ENCOUNTER — Encounter: Payer: Self-pay | Admitting: Oncology

## 2022-07-01 ENCOUNTER — Inpatient Hospital Stay: Payer: No Typology Code available for payment source | Attending: Hematology and Oncology

## 2022-07-01 VITALS — BP 144/68 | HR 65 | Temp 97.5°F | Resp 20 | Ht 67.0 in | Wt 192.1 lb

## 2022-07-01 DIAGNOSIS — C7951 Secondary malignant neoplasm of bone: Secondary | ICD-10-CM | POA: Diagnosis not present

## 2022-07-01 DIAGNOSIS — Z79818 Long term (current) use of other agents affecting estrogen receptors and estrogen levels: Secondary | ICD-10-CM | POA: Diagnosis not present

## 2022-07-01 DIAGNOSIS — Z79899 Other long term (current) drug therapy: Secondary | ICD-10-CM | POA: Insufficient documentation

## 2022-07-01 DIAGNOSIS — C61 Malignant neoplasm of prostate: Secondary | ICD-10-CM | POA: Insufficient documentation

## 2022-07-01 MED ORDER — SODIUM CHLORIDE 0.9% FLUSH
10.0000 mL | Freq: Once | INTRAVENOUS | Status: AC | PRN
  Administered 2022-07-01: 10 mL

## 2022-07-01 MED ORDER — HEPARIN SOD (PORK) LOCK FLUSH 100 UNIT/ML IV SOLN
500.0000 [IU] | Freq: Once | INTRAVENOUS | Status: AC | PRN
  Administered 2022-07-01: 500 [IU]

## 2022-07-01 MED ORDER — LEUPROLIDE ACETATE (3 MONTH) 22.5 MG IM KIT
22.5000 mg | PACK | Freq: Once | INTRAMUSCULAR | Status: AC
  Administered 2022-07-01: 22.5 mg via INTRAMUSCULAR
  Filled 2022-07-01: qty 22.5

## 2022-07-01 MED ORDER — ZOLEDRONIC ACID 4 MG/100ML IV SOLN
4.0000 mg | Freq: Once | INTRAVENOUS | Status: AC
  Administered 2022-07-01: 4 mg via INTRAVENOUS
  Filled 2022-07-01: qty 100

## 2022-07-01 MED ORDER — SODIUM CHLORIDE 0.9 % IV SOLN: Freq: Once | INTRAVENOUS | Status: AC

## 2022-07-01 NOTE — Telephone Encounter (Signed)
Office called to f/u on clearance on finding out if its okay for pt to hold PLAVIX. She stated today is the last day to find out or they'd have to cancel the surgery. She's requesting a callback today to get information. Please advise

## 2022-07-01 NOTE — Patient Instructions (Signed)
Zoledronic Acid Injection (Cancer) What is this medication? ZOLEDRONIC ACID (ZOE le dron ik AS id) treats high calcium levels in the blood caused by cancer. It may also be used with chemotherapy to treat weakened bones caused by cancer. It works by slowing down the release of calcium from bones. This lowers calcium levels in your blood. It also makes your bones stronger and less likely to break (fracture). It belongs to a group of medications called bisphosphonates. This medicine may be used for other purposes; ask your health care provider or pharmacist if you have questions. COMMON BRAND NAME(S): Zometa, Zometa Powder What should I tell my care team before I take this medication? They need to know if you have any of these conditions: Dehydration Dental disease Kidney disease Liver disease Low levels of calcium in the blood Lung or breathing disease, such as asthma Receiving steroids, such as dexamethasone or prednisone An unusual or allergic reaction to zoledronic acid, other medications, foods, dyes, or preservatives Pregnant or trying to get pregnant Breast-feeding How should I use this medication? This medication is injected into a vein. It is given by your care team in a hospital or clinic setting. Talk to your care team about the use of this medication in children. Special care may be needed. Overdosage: If you think you have taken too much of this medicine contact a poison control center or emergency room at once. NOTE: This medicine is only for you. Do not share this medicine with others. What if I miss a dose? Keep appointments for follow-up doses. It is important not to miss your dose. Call your care team if you are unable to keep an appointment. What may interact with this medication? Certain antibiotics given by injection Diuretics, such as bumetanide, furosemide NSAIDs, medications for pain and inflammation, such as ibuprofen or naproxen Teriparatide Thalidomide This list  may not describe all possible interactions. Give your health care provider a list of all the medicines, herbs, non-prescription drugs, or dietary supplements you use. Also tell them if you smoke, drink alcohol, or use illegal drugs. Some items may interact with your medicine. What should I watch for while using this medication? Visit your care team for regular checks on your progress. It may be some time before you see the benefit from this medication. Some people who take this medication have severe bone, joint, or muscle pain. This medication may also increase your risk for jaw problems or a broken thigh bone. Tell your care team right away if you have severe pain in your jaw, bones, joints, or muscles. Tell you care team if you have any pain that does not go away or that gets worse. Tell your dentist and dental surgeon that you are taking this medication. You should not have major dental surgery while on this medication. See your dentist to have a dental exam and fix any dental problems before starting this medication. Take good care of your teeth while on this medication. Make sure you see your dentist for regular follow-up appointments. You should make sure you get enough calcium and vitamin D while you are taking this medication. Discuss the foods you eat and the vitamins you take with your care team. Check with your care team if you have severe diarrhea, nausea, and vomiting, or if you sweat a lot. The loss of too much body fluid may make it dangerous for you to take this medication. You may need bloodwork while taking this medication. Talk to your care team if   you wish to become pregnant or think you might be pregnant. This medication can cause serious birth defects. What side effects may I notice from receiving this medication? Side effects that you should report to your care team as soon as possible: Allergic reactions--skin rash, itching, hives, swelling of the face, lips, tongue, or  throat Kidney injury--decrease in the amount of urine, swelling of the ankles, hands, or feet Low calcium level--muscle pain or cramps, confusion, tingling, or numbness in the hands or feet Osteonecrosis of the jaw--pain, swelling, or redness in the mouth, numbness of the jaw, poor healing after dental work, unusual discharge from the mouth, visible bones in the mouth Severe bone, joint, or muscle pain Side effects that usually do not require medical attention (report to your care team if they continue or are bothersome): Constipation Fatigue Fever Loss of appetite Nausea Stomach pain This list may not describe all possible side effects. Call your doctor for medical advice about side effects. You may report side effects to FDA at 1-800-FDA-1088. Where should I keep my medication? This medication is given in a hospital or clinic. It will not be stored at home. NOTE: This sheet is a summary. It may not cover all possible information. If you have questions about this medicine, talk to your doctor, pharmacist, or health care provider.  2023 Elsevier/Gold Standard (2007-04-22 00:00:00) Leuprolide Suspension for Injection (Prostate Cancer) What is this medication? LEUPROLIDE (loo PROE lide) reduces the symptoms of prostate cancer. It works by decreasing levels of the hormone testosterone in the body. This prevents prostate cancer cells from spreading or growing. This medicine may be used for other purposes; ask your health care provider or pharmacist if you have questions. COMMON BRAND NAME(S): Eligard, Lupron Depot, Lupron Depot-Ped, Lutrate Depot, Viadur What should I tell my care team before I take this medication? They need to know if you have any of these conditions: Diabetes Heart disease Heart failure High or low levels of electrolytes, such as magnesium, potassium, or sodium in your blood Irregular heartbeat or rhythm Seizures An unusual or allergic reaction to leuprolide, other  medications, foods, dyes, or preservatives Pregnant or trying to get pregnant Breast-feeding How should I use this medication? This medication is injected under the skin or into a muscle. It is given by your care team in a hospital or clinic setting. Talk to your care team about the use of this medication in children. Special care may be needed. Overdosage: If you think you have taken too much of this medicine contact a poison control center or emergency room at once. NOTE: This medicine is only for you. Do not share this medicine with others. What if I miss a dose? Keep appointments for follow-up doses. It is important not to miss your dose. Call your care team if you are unable to keep an appointment. What may interact with this medication? Do not take this medication with any of the following: Cisapride Dronedarone Ketoconazole Levoketoconazole Pimozide Thioridazine This medication may also interact with the following: Other medications that cause heart rhythm changes This list may not describe all possible interactions. Give your health care provider a list of all the medicines, herbs, non-prescription drugs, or dietary supplements you use. Also tell them if you smoke, drink alcohol, or use illegal drugs. Some items may interact with your medicine. What should I watch for while using this medication? Visit your care team for regular checks on your progress. Tell your care team if your symptoms do not start  to get better or if they get worse. This medication may increase blood sugar. The risk may be higher in patients who already have diabetes. Ask your care team what you can do to lower the risk of diabetes while taking this medication. This medication may cause infertility. Talk to your care team if you are concerned about your fertility. Heart attacks and strokes have been reported with the use of this medication. Get emergency help if you develop signs or symptoms of a heart attack or  stroke. Talk to your care team about the risks and benefits of this medication. What side effects may I notice from receiving this medication? Side effects that you should report to your care team as soon as possible: Allergic reactions--skin rash, itching, hives, swelling of the face, lips, tongue, or throat Heart attack--pain or tightness in the chest, shoulders, arms, or jaw, nausea, shortness of breath, cold or clammy skin, feeling faint or lightheaded Heart rhythm changes--fast or irregular heartbeat, dizziness, feeling faint or lightheaded, chest pain, trouble breathing High blood sugar (hyperglycemia)--increased thirst or amount of urine, unusual weakness or fatigue, blurry vision Mood swings, irritability, hostility Seizures Stroke--sudden numbness or weakness of the face, arm, or leg, trouble speaking, confusion, trouble walking, loss of balance or coordination, dizziness, severe headache, change in vision Thoughts of suicide or self-harm, worsening mood, feelings of depression Side effects that usually do not require medical attention (report to your care team if they continue or are bothersome): Bone pain Change in sex drive or performance General discomfort and fatigue Hot flashes Muscle pain Pain, redness, or irritation at injection site Swelling of the ankles, hands, or feet This list may not describe all possible side effects. Call your doctor for medical advice about side effects. You may report side effects to FDA at 1-800-FDA-1088. Where should I keep my medication? This medication is given in a hospital or clinic. It will not be stored at home. NOTE: This sheet is a summary. It may not cover all possible information. If you have questions about this medicine, talk to your doctor, pharmacist, or health care provider.  2023 Elsevier/Gold Standard (2021-05-11 00:00:00)

## 2022-07-01 NOTE — Telephone Encounter (Signed)
   Primary Cardiologist: Nanetta Batty, MD  Chart reviewed as part of pre-operative protocol coverage. Given past medical history and time since last visit, based on ACC/AHA guidelines, Marvin Johnson would be at acceptable risk for the planned procedure without further cardiovascular testing.   Patient was advised that if he develops new symptoms prior to surgery to contact our office to arrange a follow-up appointment.  He verbalized understanding.  Per Dr. [place], patient may hold Plavix for 5 days prior to procedure and resumed as soon as hemodynamically stable post procedure. Ideally aspirin should be continued without interruption.    I will route this recommendation to the requesting party via Epic fax function and remove from pre-op pool.  Please call with questions.  Levi Aland, NP-C  07/01/2022, 2:47 PM [address], [address] Office [phone] Fax [phone]

## 2022-07-01 NOTE — Telephone Encounter (Signed)
Tina from requesting office called to see if we were able to get complete clearance for the pt, in regard to Plavix. I read notes per Dr. Sw[place]and (DOD:      Primary Cardiologist: Nanetta Batty, MD   Chart reviewed as part of pre-operative protocol coverage. Given past medical history and time since last visit, based on ACC/AHA guidelines, Marvin Johnson would be at acceptable risk for the planned procedure without further cardiovascular testing.    Patient was advised that if he develops new symptoms prior to surgery to contact our office to arrange a follow-up appointment.  He verbalized understanding.   Per Dr. [place]and, patient may hold Plavix for 5 days prior to procedure and resumed as soon as hemodynamically stable post procedure. Ideally aspirin should be continued without interruption.        I assured Inetta Fermo I will fax notes to her now. Inetta Fermo thanked me for the help.

## 2022-07-01 NOTE — Telephone Encounter (Signed)
I will forward this message as FYI to pre op APP. I will forward to Dr. Allyson Sabal to please comment in his ov note recommendations for holding Plavix and for how long to hold Plavix for upcoming surgery.

## 2022-07-02 ENCOUNTER — Ambulatory Visit: Payer: Non-veteran care | Admitting: Oncology

## 2022-07-02 ENCOUNTER — Other Ambulatory Visit: Payer: Non-veteran care

## 2022-07-05 ENCOUNTER — Encounter: Payer: Self-pay | Admitting: Oncology

## 2022-07-07 ENCOUNTER — Ambulatory Visit: Payer: Non-veteran care

## 2022-07-09 ENCOUNTER — Ambulatory Visit: Payer: Non-veteran care

## 2022-07-09 NOTE — Progress Notes (Addendum)
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 He has been on Enzalutamide 160 mg daily, as well as leuprolide 22.5 mg, and zoledronic acid every 3 months.  His pain is controlled with MS Contin 15 mg twice daily with occasional Tylenol for breakthrough.   He was on Enzalutamide since June 2018.  He then had a steadily rising PSA, up to 83.49 in November 2022.  PSMA PET in October 2022 revealed an oligometastasis skeletal lesion it in the anterior right 7th rib, with increased activity, and very subtle sclerotic change at T10, but no evidence of progressive disease.The PSA has continued to fluctuate up and down.  It was down to 31 in February of 2023, then back up to 78 in May.  It then increased dramatically to 334 in August and he was scheduled for a PSMA PET scan.  This revealed severe progression of disease with several tiny lung nodules which did have hypermetabolic activity, with SUV up to 5.  He had increased bone lesions which are now innumerable, including major lesions at L3, the right iliac wing, and the left pubic bone. These were not visible on CT scan. He stopped the Enzalutamide and started the Taxotere chemotherapy in September 2023 and is tolerating this well. His PSA went down from 536 to 400.75 after 3 cycles and went down to 284 with the 5th cycle, but then back up to 406 with his 6th cycle. In February his PSA went down to 235 and then 203 in March, 2024. Therefore we will plan to continue the Taxotere unless he has significant toxicities.    Secondary malignant neoplasm of bone and bone marrow (HCC) He continues zoledronic acid every 3 months in addition to his cancer therapy. He had marked progression of his bone  metastases in August of 2023, but minimal symptoms.   Anemia. He had an upper GI hemorrhage earlier this month and was referred to Dr. [name]. Fortunately he just had the one episode and was transfused. His hemoglobin came up to 11.1 and his hemoglobin remained stable at 11.3. He had a EGD scheduled on 07/09/2022 but it was canceled. His cardiologist Dr. Allyson Sabal is not comfortable with him stopping Plavix for another year. I don't know whether he realizes we were just requesting it for a few days in order to do the procedure, however, his bleeding has stopped and so we will not pursue an EGD at this time unless he begins having melena again and/or a drop in his hemoglobin. It came up to 12.3 today.  Leukocytosis This was due to the Neulasta injection and his white count 11.7 today.    Plan:  His CBC continues to improve with his WBC is at 11.7, hemoglobin improved from 11.3 to 12.3, and platelet at 287,000 today. His day 1 cycle 8 of Taxotere is scheduled on 07/16/2022. He will be due for repeat scans this coming fall or sooner if he becomes symptomatic. He had a EGD scheduled on 07/09/2022 but it was canceled. His cardiologist Dr. Allyson Sabal is not comfortable with him stopping Plavix for another year. I don't know whether he realizes we were just requesting it for a few days in order to do the procedure, however, his bleeding has stopped and so we will  not pursue an EGD at this time unless he begins having melena again and/or a drop in his hemoglobin. He will receive Lupron and Zometa injections sometime this month and again every 3 months. I will see him back in 3 weeks with CBC, CMP, and PSA.  The patient understands the plans discussed today and is in agreement with them.  He knows to contact our office if he develops concerns prior to his next appointment.  His questions were answered.     I provided 15 minutes of face-to-face time during this encounter and > 50% was spent counseling as documented  under my assessment and plan.   [name], MD  [facility] [address] [place] [address] Dept: [phone] Dept Fax: [phone]   No orders of the defined types were placed in this encounter.  I have reviewed this report as typed by the medical scribe, and it is complete and accurate.  [name]   07/26/22  9:46 AM     CHIEF COMPLAINT:  CC: Metastatic prostate cancer to bone, progressive  Current Treatment: Leuprolide/zoledronic acid and docetaxel  HISTORY OF PRESENT ILLNESS:  Marvin Johnson is a 72 year old male with a history of stage IV (T3b N1 M0) prostate cancer diagnosed in January 2016 when he was found to have a PSA of 51.8.  He was treated with a laparoscopic prostatectomy in May 2016.  Pathology revealed adenocarcinoma with a Gleason 8.  There was a positive posterior margin with extraprostatic extension, as well as left retro-urethral tissue positive and extension to the seminal vesicles bilaterally with multiple positive margins.  Two lymph nodes from the right side were negative, but 2/4 lymph nodes from the left side were positive for metastasis. Repeat bone scan in June 2016 revealed a new lesion at T10, which was not present on his baseline bone scan.  PSA at Dr. Cindee Salt office in early August 2016 remained elevated at 56.4.   We began seeing him in August 2016. The PSA was up to 216.3 in late August 2016, so hormonal therapy was recommended  X-rays of the thoracic spine revealed osteoarthritis and scoliosis, as well as evidence of demineralization, but we could not visualize a metastatic lesion.[name] He started leuprolide in September 2016.  The PSA initially dropped steadily with leuprolide.  Bone density scan in November 2016 revealed osteopenia, with a T-score of -2.1 in the spine and a T-score of -0.9 in the femur.  He had been taking vitamin-D 1000 international units daily, but  not calcium, so calcium 600 mg twice daily was added at that time.    Leuprolide was held in February 2017 because of chest pain.  EKG was unremarkable.  He went to the [place] and he was referred to the [facility] for placement of 2 coronary artery stents for 95% and 99% occlusions.  He then had a third coronary artery stent placed later in the summer.  Leuprolide was resumed in March 2017.  In September 2017, he had an increase in the PSA, so we repeated a bone scan.  This revealed intense uptake within the right posterior elements of T10, but no other areas of metastasis.  We then added bicalutamide 50 mg daily to his leuprolide.     He had a steadily increasing PSA despite the bicalutamide, so this was discontinued in March 2018.  He eventually was placed on MS Contin 15 mg twice daily, with oxycodone 10  mg every 4 hours as needed for breakthrough pain.  MRI thoracic spine in April 2018 revealed increasing tumor at T10 with epidural spread.  Bone scan at that time revealed increased activity in the T10 lesion, which was stable.  There was a questionable tiny focus in the right ischium/acetabular rim.  Plain x-rays of the bilateral hips and pelvis did not reveal any focal abnormality. The PSA increased from 27.5 in March, to 72.2 in May, then to 92.2 in June 2018.  He was referred for radiotherapy to the T10 lesion due to the severe pain with improvement in his pain. He was started on zoledronic acid along with his leuprolide in June 2018.  He had a Port-A-Cath placed in anticipation of chemotherapy, but then we recommended that he be placed on enzalutamide 160 mg daily instead, in order to delay the time to initiation of chemotherapy.  He started enzalutamide in June 2018.  The PSA became undetectable in October 2018.  We continued MS Contin, but discontinued the oxycodone. He did not tolerate meloxicam, so uses Tylenol as needed for breakthrough pain.     He had a stress test in January 2020 and was referred to  [facility] for coronary artery stenting, which was done in February.  Zoledronic acid had been given monthly for over 2 years, so was changed to every 3 months in October 2020.   The PSA started to increase in January 2021 at which time it was 0.2.  CT chest, abdomen and pelvis in March 2021 not reveal any lymphadenopathy or soft tissue metastatic disease.  Hepatic steatosis was seen.  There was a stable sclerotic osseous lesion of the T10 vertebral body, as well as a subtle sclerotic lesion of the L3 vertebral body, also possibly a small metastatic lesion.  Whole body bone scan did not reveal continued increased activity of T10 and right acetabulum posteriorly.  The PSA was 0.5 in March.  PSA had increased to 3.0 in June.  Axumin PET imaging from July did not reveal any evidence of active prostate carcinoma, local recurrence, or metastatic disease. The PSA continued to slowly rise and was up to 5.9 in September, 10.9 in October, and 16.3 in December.     The PSA went up to 23.81 in March 2022, so an Axumin PET scan was ordered which revealed an oligometastasis skeletal lesion in the anterior right 7th rib with very subtle sclerotic change at this level. PSA in June decreased to 18.6, but then in September increased to 58.  In view of the significant rise in his PSA, a prostate specific PET scan was ordered.  PSMA PET in October 2022 revealed a persistent focus of intense radiotracer activity in the anterior RIGHT seventh rib with increased in activity from comparison exam and subtle sclerotic changes on CT. Findings remain consistent with oligometastatic skeletal metastasis. Sclerotic lesions in the T10 vertebral body without radiotracer activity potentially represented prior treated prostate cancer metastasis.  We recommended he continue his current treatment with enzalutamide, leuprolide and zoledronic acid.  PSA in November continued to increase to 83.49.  Even though his PSA continued to  steadily increase, we did not recommend a change in therapy based on this alone.  He had no severe pain and no increase in pain and was having a good quality of life.    The PSA has continued to fluctuate up and down and was 31 in February and 78 in May of 2023. t then increased dramatically to  334 in August and he was scheduled for a PSMA PET scan.  This revealed severe progression of disease with several tiny lung nodules which did have hypermetabolic activity, with SUV up to 5.  He had increased bone lesions which are now innumerable, including major lesions at L3, the right iliac wing, and the left pubic bone. These were not visible on CT scan. He stopped the Enzalutamide and started the Taxotere chemotherapy in September 2023 and is tolerating this well. His PSA went down from 536 to 400.75 after 3 cycles and went down to 284 with the 5th cycle. It was down to 203 by March of 2024. He then had a gastrointestinal hemorrhage with melena and drop of his hemoglobin from 12 to 7.0 on March 11th. He was hospitalized for transfusion but EGD was not done due to needing to stay on Plavix after his most recent MI.   Oncology History  Malignant neoplasm of prostate (HCC)  03/30/2014 Cancer Staging   Staging form: Prostate, AJCC 8th Edition - Clinical stage from 03/30/2014: Stage IVA (cT3b, cN1, cM0, PSA: 51.8, Grade Group: 4) - Signed by [name], MD on 11/14/2020 Histopathologic type: Adenocarcinoma, NOS Stage prefix: Initial diagnosis Prostate specific antigen (PSA) range: 20 or greater Gleason primary pattern: 4 Gleason secondary pattern: 4 Gleason score: 8 Histologic grading system: 5 grade system Location of positive needle core biopsies: Beyond primary site Prognostic indicators: May 2016 Robotic prostatectomy with mult. Pos margins, extra prostatic extension, 2/6 pos nodes. Scan June 2016 positive at T10  PSA up to 216.3 by August Placed on lupron and casodex Stage used in treatment  planning: Yes National guidelines used in treatment planning: Yes Type of national guideline used in treatment planning: NCCN   02/18/2020 Initial Diagnosis   Malignant neoplasm of prostate (HCC)   12/01/2021 -  Chemotherapy   Patient is on Treatment Plan : PROSTATE Docetaxel (75) + Prednisone q21d     Secondary malignant neoplasm of bone and bone marrow (HCC)  02/18/2020 Initial Diagnosis   Secondary malignant neoplasm of bone and bone marrow (HCC)   12/01/2021 -  Chemotherapy   Patient is on Treatment Plan : PROSTATE Docetaxel (75) + Prednisone q21d         INTERVAL HISTORY:  Marvin Johnson is here today for repeat clinical assessment for progressive metastatic prostate cancer to bone. Patient states that he feels well and has no complaints of pain. His CBC continues to improve with his WBC is at 11.7 hemoglobin improved from 11.3 to 12.3, and platelet at 287,000 today. His day 1 cycle 8 of Taxotere is scheduled on 07/16/2022. He will be due for repeat scans in the fall or sooner if he becomes symptomatic. He had a EGD scheduled on 07/09/2022 but it was canceled. His cardiologist Dr. Allyson Sabal is not comfortable with him stopping Plavix for another year. I don't know whether he realizes we were just requesting it for a few days in order to do the procedure, however, his bleeding has stopped and so we will not pursue an EGD at this time unless he begins having melena again and/or a drop in his hemoglobin.  He will receive Lupron and Zometa injections sometime this month and again in 3 months. I will see him back in 3 weeks with CBC, CMP, and PSA. He denies signs of infection such as sore throat, sinus drainage, cough, or urinary symptoms.  He denies fevers or recurrent chills. He denies pain. He denies nausea, vomiting, chest pain, dyspnea  or cough. His appetite is good and his weight has increased 4 pounds over last 2 weeks .  REVIEW OF SYSTEMS:  Review of Systems  Constitutional: Negative.  Negative for  appetite change, chills, diaphoresis, fatigue, fever and unexpected weight change.  HENT:  Negative.  Negative for hearing loss, lump/mass, mouth sores, nosebleeds, sore throat, tinnitus, trouble swallowing and voice change.   Eyes: Negative.  Negative for eye problems and icterus.  Respiratory: Negative.  Negative for chest tightness, cough, hemoptysis, shortness of breath and wheezing.   Cardiovascular: Negative.  Negative for chest pain, leg swelling and palpitations.  Gastrointestinal: Negative.  Negative for abdominal distention, abdominal pain, blood in stool, constipation, diarrhea, nausea, rectal pain and vomiting.  Endocrine: Negative.  Negative for hot flashes.  Genitourinary: Negative.  Negative for bladder incontinence, difficulty urinating, dyspareunia, dysuria, frequency, hematuria, nocturia, pelvic pain and penile discharge.   Musculoskeletal:  Positive for arthralgias (chronic). Negative for back pain, flank pain, gait problem, myalgias, neck pain and neck stiffness.  Skin: Negative.  Negative for itching, rash and wound.  Neurological: Negative.  Negative for dizziness, extremity weakness, gait problem, headaches, light-headedness, numbness, seizures and speech difficulty.  Hematological: Negative.  Negative for adenopathy. Does not bruise/bleed easily.  Psychiatric/Behavioral: Negative.  Negative for confusion, decreased concentration, depression, sleep disturbance and suicidal ideas. The patient is not nervous/anxious.     VITALS:  Blood pressure (!) 148/68, pulse 60, temperature 97.8 F (36.6 C), temperature source Oral, resp. rate 18, height 5\' 7"  (1.702 m), weight 194 lb 8 oz (88.2 kg), SpO2 90 %.  Wt Readings from Last 3 Encounters:  07/16/22 193 lb 1.9 oz (87.6 kg)  07/13/22 194 lb 8 oz (88.2 kg)  07/01/22 192 lb 1.9 oz (87.1 kg)    Body mass index is 30.46 kg/m.  Performance status (ECOG): 1 - Symptomatic but completely ambulatory  PHYSICAL EXAM:  Physical  Exam Vitals and nursing note reviewed. Exam conducted with a chaperone present.  Constitutional:      General: He is not in acute distress.    Appearance: Normal appearance. He is normal weight. He is not ill-appearing, toxic-appearing or diaphoretic.  HENT:     Head: Normocephalic and atraumatic.     Right Ear: Tympanic membrane, ear canal and external ear normal. There is no impacted cerumen.     Left Ear: Tympanic membrane, ear canal and external ear normal. There is no impacted cerumen.     Nose: No congestion or rhinorrhea.     Mouth/Throat:     Mouth: Mucous membranes are moist.     Pharynx: Oropharynx is clear. No oropharyngeal exudate or posterior oropharyngeal erythema.  Eyes:     General: No scleral icterus.       Right eye: No discharge.        Left eye: No discharge.     Extraocular Movements: Extraocular movements intact.     Conjunctiva/sclera: Conjunctivae normal.     Pupils: Pupils are equal, round, and reactive to light.  Neck:     Vascular: No carotid bruit.  Cardiovascular:     Rate and Rhythm: Normal rate and regular rhythm.     Pulses: Normal pulses.     Heart sounds: Normal heart sounds. No murmur heard.    No friction rub. No gallop.  Pulmonary:     Effort: Pulmonary effort is normal. No respiratory distress.     Breath sounds: Normal breath sounds. No stridor. No wheezing, rhonchi or rales.  Chest:  Chest wall: No tenderness.  Abdominal:     General: Bowel sounds are normal. There is no distension.     Palpations: Abdomen is soft. There is no hepatomegaly, splenomegaly or mass.     Tenderness: There is no abdominal tenderness. There is no right CVA tenderness, left CVA tenderness, guarding or rebound.     Hernia: No hernia is present.  Musculoskeletal:        General: No swelling, tenderness, deformity or signs of injury. Normal range of motion.     Cervical back: Normal range of motion and neck supple. No rigidity or tenderness.     Right lower leg:  No edema.     Left lower leg: No edema.  Lymphadenopathy:     Cervical: No cervical adenopathy.     Upper Body:     Right upper body: No supraclavicular or axillary adenopathy.     Left upper body: No supraclavicular or axillary adenopathy.     Lower Body: No right inguinal adenopathy. No left inguinal adenopathy.  Skin:    General: Skin is warm and dry.     Coloration: Skin is not jaundiced or pale.     Findings: No bruising, erythema, lesion or rash.  Neurological:     General: No focal deficit present.     Mental Status: He is alert and oriented to person, place, and time. Mental status is at baseline.     Cranial Nerves: No cranial nerve deficit.     Sensory: No sensory deficit.     Motor: No weakness.     Coordination: Coordination normal.     Gait: Gait normal.     Deep Tendon Reflexes: Reflexes normal.  Psychiatric:        Mood and Affect: Mood normal.        Behavior: Behavior normal.        Thought Content: Thought content normal.        Judgment: Judgment normal.    LABS:       Latest Ref Rng & Units 07/13/2022   12:00 AM 06/11/2022   12:00 AM 05/24/2022   12:00 AM  CBC  WBC  11.7     12.9     27.2      Hemoglobin 13.5 - 17.5 12.3     11.3     7.0      Hematocrit 41 - 53 38     35     22      Platelets 150 - 400 K/uL 299     287     419         This result is from an external source.      Latest Ref Rng & Units 07/13/2022   12:00 AM 06/11/2022    8:05 AM 05/24/2022   12:00 AM  CMP  Glucose 70 - 99 mg/dL  95    BUN 4 - 21 21     19  15       Creatinine 0.6 - 1.3 0.8     0.76  1.0      Sodium 137 - 147 140     139  140      Potassium 3.5 - 5.1 mEq/L 4.5     4.3  4.3      Chloride 99 - 108 106     103  106      CO2 13 - 22 29     26   28  Calcium 8.7 - 10.7 9.6     9.2  9.3      Total Protein 6.5 - 8.1 g/dL  7.4    Total Bilirubin 0.3 - 1.2 mg/dL  0.5    Alkaline Phos 25 - 125 74     54  76      AST 14 - 40 49     20  125      ALT 10 - 40 U/L 20     19   24          This result is from an external source.   Component Ref Range & Units 4 wk ago (06/11/22) 1 mo ago (05/24/22) 2 mo ago (05/03/22) 3 mo ago (04/02/22) 3 mo ago (03/12/22)  Prostatic Specific Antigen 0.00 - 4.00 ng/mL >150.00 High  VC 203.44 High  CM 235.00 High  CM 406.00 High  CM 284.59 High  CM     No results found for: "CEA1", "CEA" / No results found for: "CEA1", "CEA" Lab Results  Component Value Date   PSA1 334.0 (H) 10/30/2021    No results found for: "JYN829" No results found for: "CAN125"  No results found for: "TOTALPROTELP", "ALBUMINELP", "A1GS", "A2GS", "BETS", "BETA2SER", "GAMS", "MSPIKE", "SPEI" Lab Results  Component Value Date   TIBC 398 01/29/2022   FERRITIN 206 01/29/2022   IRONPCTSAT 25 01/29/2022   No results found for: "LDH"  STUDIES:  Exam: 06/29/2022 EKG Impression: N/A  Exam: 04/17/2022 CT abdomen and Pelvis without Contrast Impression: Decreased sensitivity exam secondary to lack of contrast and minimal motion degradation. Fluid-filled colon, suggesting a diarrheal state. No other explanation for patient's acute symptoms. Prostatectomy. Sclerotic lesions including within the anterior seventh right rib and right iliac are suspicious for progressive osseous metastasis. No findings of extraosseous metastatic disease within the abdomen or pelvis. New lingular nodules up to 7mm are indeterminate. Consider non-emergent, outpatient chest CT to evaluate for pulmonary metastasis. Small bowel non rotation. Hepatic Steatosis.    HISTORY:   Past Medical History:  Diagnosis Date   Hyperlipidemia    Malignant neoplasm of prostate (HCC)    Supraventricular tachycardia     Past Surgical History:  Procedure Laterality Date   CORONARY STENT INTERVENTION N/A 02/08/2022   Procedure: CORONARY STENT INTERVENTION;  Surgeon: Yvonne Kendall, MD;  Location: [facility] INVASIVE [facility];  Service: Cardiovascular;  Laterality: N/A;   LEFT HEART CATH AND  CORONARY ANGIOGRAPHY N/A 02/08/2022   Procedure: LEFT HEART CATH AND CORONARY ANGIOGRAPHY;  Surgeon: Yvonne Kendall, MD;  Location: [facility] INVASIVE CV LAB;  Service: Cardiovascular;  Laterality: N/A;   PROSTATECTOMY  07/2014   TONSILLECTOMY      Family History  Problem Relation Age of Onset   Breast cancer Mother 55   Ovarian cancer Mother 81   Breast cancer Paternal Grandmother 53    Social History:  reports that he has never smoked. He has never used smokeless tobacco. He reports that he does not currently use alcohol. He reports that he does not use drugs.The patient is alone today.  Allergies:  Allergies  Allergen Reactions   Atorvastatin    Rosuvastatin Other (See Comments)    Other reaction(s): Muscle pain   Simvastatin Other (See Comments)    Other reaction(s): Joint pain, Muscle pain   Sulfa Antibiotics    Sulfamethoxazole-Trimethoprim Other (See Comments)    Unknown as to interaction was a baby.    Current Medications: Current Outpatient Medications  Medication Sig Dispense Refill   Alirocumab (  PRALUENT [place]) Inject into the skin. One injection every 2 weeks Monday     APPLE CIDER VINEGAR PO Take by mouth.     Cholecalciferol 25 MCG (1000 UT) tablet Take 1,000 Units by mouth in the morning and at bedtime.     clopidogrel (PLAVIX) 75 MG tablet Take 75 mg by mouth daily.     dexamethasone (DECADRON) 4 MG tablet Take 8 mg by mouth See admin instructions. Take two tablets by mouth twice a day (start the day before chemo, then take daily for 2 days starting te day after chemo; take with food)     diphenhydrAMINE (BENADRYL) 50 MG capsule Take 50 mg by mouth at bedtime as needed.     ezetimibe (ZETIA) 10 MG tablet Take 10 mg by mouth daily.     isosorbide mononitrate (IMDUR) 30 MG 24 hr tablet Take 1 tablet by mouth every 8 (eight) hours as needed.     leuprolide (LUPRON) 22.5 MG injection Inject 22.5 mg into the muscle every 3 (three) months.     metoprolol tartrate (LOPRESSOR)  25 MG tablet Take 12.5 mg by mouth 2 (two) times daily.     morphine (MS CONTIN) 15 MG 12 hr tablet Take 1 tablet (15 mg total) by mouth every 12 (twelve) hours. 60 tablet 0   Multiple Vitamins-Minerals (MULTIVITAMIN WITH MINERALS) tablet Take 1 tablet by mouth daily.     nitroGLYCERIN (NITROSTAT) 0.4 MG SL tablet Place 0.4 mg under the tongue every 5 (five) minutes as needed for chest pain. Repeat every 5 minutes if needed for a total of 3 tablets in 15 minutes. If no relief, CALL 911.     Omega-3 Fatty Acids (FISH OIL) 1000 MG CAPS Take 2,000 mg by mouth in the morning and at bedtime.     pantoprazole (PROTONIX) 40 MG tablet Take 1 tablet (40 mg total) by mouth 2 (two) times daily. 60 tablet 5   polyethylene glycol powder (GLYCOLAX/MIRALAX) 17 GM/SCOOP powder Take 1 Container by mouth once.     predniSONE (DELTASONE) 5 MG tablet Take 1 tablet (5 mg total) by mouth in the morning and at bedtime. 60 tablet 11   Royal Jelly-Bee Pollen-Ginseng (KOREAN GINSENG COMPLEX) 150-250-50 MG CAPS Take 1 capsule by mouth daily.     Zoledronic Acid (ZOMETA) 4 MG/100ML IVPB Inject 4 mg into the vein every 3 (three) months.     No current facility-administered medications for this visit.     I,Jasmine M Lassiter,acting as a scribe for [name], MD.,have documented all relevant documentation on the behalf of [name], MD,as directed by  [name], MD while in the presence of [name], MD.

## 2022-07-13 ENCOUNTER — Inpatient Hospital Stay: Payer: No Typology Code available for payment source | Admitting: Oncology

## 2022-07-13 ENCOUNTER — Inpatient Hospital Stay: Payer: No Typology Code available for payment source

## 2022-07-13 ENCOUNTER — Encounter: Payer: Self-pay | Admitting: Oncology

## 2022-07-13 VITALS — BP 148/68 | HR 60 | Temp 97.8°F | Resp 18 | Ht 67.0 in | Wt 194.5 lb

## 2022-07-13 DIAGNOSIS — C7951 Secondary malignant neoplasm of bone: Secondary | ICD-10-CM

## 2022-07-13 DIAGNOSIS — C7800 Secondary malignant neoplasm of unspecified lung: Secondary | ICD-10-CM | POA: Diagnosis not present

## 2022-07-13 DIAGNOSIS — C61 Malignant neoplasm of prostate: Secondary | ICD-10-CM

## 2022-07-13 DIAGNOSIS — C7952 Secondary malignant neoplasm of bone marrow: Secondary | ICD-10-CM | POA: Diagnosis not present

## 2022-07-13 LAB — HEPATIC FUNCTION PANEL
ALT: 20 U/L (ref 10–40)
AST: 49 — AB (ref 14–40)
Alkaline Phosphatase: 74 (ref 25–125)
Bilirubin, Total: 0.4

## 2022-07-13 LAB — BASIC METABOLIC PANEL
BUN: 21 (ref 4–21)
CO2: 29 — AB (ref 13–22)
Chloride: 106 (ref 99–108)
Creatinine: 0.8 (ref 0.6–1.3)
Glucose: 146
Potassium: 4.5 mEq/L (ref 3.5–5.1)
Sodium: 140 (ref 137–147)

## 2022-07-13 LAB — CBC AND DIFFERENTIAL
HCT: 38 — AB (ref 41–53)
Hemoglobin: 12.3 — AB (ref 13.5–17.5)
Neutrophils Absolute: 7.96
Platelets: 299 10*3/uL (ref 150–400)
WBC: 11.7

## 2022-07-13 LAB — COMPREHENSIVE METABOLIC PANEL
Albumin: 4.4 (ref 3.5–5.0)
Calcium: 9.6 (ref 8.7–10.7)

## 2022-07-13 LAB — CBC: RBC: 4.53 (ref 3.87–5.11)

## 2022-07-14 ENCOUNTER — Other Ambulatory Visit: Payer: Self-pay

## 2022-07-15 MED FILL — Docetaxel Soln for IV Infusion 160 MG/16ML: INTRAVENOUS | Qty: 12 | Status: AC

## 2022-07-15 MED FILL — Dexamethasone Sodium Phosphate Inj 100 MG/10ML: INTRAMUSCULAR | Qty: 1 | Status: AC

## 2022-07-16 ENCOUNTER — Inpatient Hospital Stay: Payer: No Typology Code available for payment source | Attending: Hematology and Oncology

## 2022-07-16 VITALS — BP 153/70 | HR 66 | Temp 98.4°F | Resp 16 | Ht 67.0 in | Wt 193.1 lb

## 2022-07-16 DIAGNOSIS — K5909 Other constipation: Secondary | ICD-10-CM | POA: Insufficient documentation

## 2022-07-16 DIAGNOSIS — H532 Diplopia: Secondary | ICD-10-CM | POA: Diagnosis not present

## 2022-07-16 DIAGNOSIS — C7951 Secondary malignant neoplasm of bone: Secondary | ICD-10-CM | POA: Insufficient documentation

## 2022-07-16 DIAGNOSIS — Z79899 Other long term (current) drug therapy: Secondary | ICD-10-CM | POA: Diagnosis not present

## 2022-07-16 DIAGNOSIS — C61 Malignant neoplasm of prostate: Secondary | ICD-10-CM | POA: Insufficient documentation

## 2022-07-16 DIAGNOSIS — D72829 Elevated white blood cell count, unspecified: Secondary | ICD-10-CM | POA: Insufficient documentation

## 2022-07-16 DIAGNOSIS — D649 Anemia, unspecified: Secondary | ICD-10-CM | POA: Diagnosis not present

## 2022-07-16 DIAGNOSIS — R21 Rash and other nonspecific skin eruption: Secondary | ICD-10-CM | POA: Diagnosis not present

## 2022-07-16 DIAGNOSIS — R5383 Other fatigue: Secondary | ICD-10-CM | POA: Insufficient documentation

## 2022-07-16 MED ORDER — HEPARIN SOD (PORK) LOCK FLUSH 100 UNIT/ML IV SOLN
500.0000 [IU] | Freq: Once | INTRAVENOUS | Status: AC | PRN
  Administered 2022-07-16: 500 [IU]

## 2022-07-16 MED ORDER — SODIUM CHLORIDE 0.9 % IV SOLN
59.5000 mg/m2 | Freq: Once | INTRAVENOUS | Status: AC
  Administered 2022-07-16: 120 mg via INTRAVENOUS
  Filled 2022-07-16: qty 12

## 2022-07-16 MED ORDER — SODIUM CHLORIDE 0.9 % IV SOLN: Freq: Once | INTRAVENOUS | Status: AC

## 2022-07-16 MED ORDER — SODIUM CHLORIDE 0.9% FLUSH
10.0000 mL | INTRAVENOUS | Status: DC | PRN
  Administered 2022-07-16: 10 mL

## 2022-07-16 MED ORDER — SODIUM CHLORIDE 0.9 % IV SOLN
10.0000 mg | Freq: Once | INTRAVENOUS | Status: AC
  Administered 2022-07-16: 10 mg via INTRAVENOUS
  Filled 2022-07-16: qty 10

## 2022-07-16 NOTE — Patient Instructions (Signed)
Docetaxel Injection What is this medication? DOCETAXEL (doe se TAX el) treats some types of cancer. It works by slowing down the growth of cancer cells. This medicine may be used for other purposes; ask your health care provider or pharmacist if you have questions. COMMON BRAND NAME(S): Docefrez, Taxotere What should I tell my care team before I take this medication? They need to know if you have any of these conditions: Kidney disease Liver disease Low white blood cell levels Tingling of the fingers or toes or other nerve disorder An unusual or allergic reaction to docetaxel, polysorbate 80, other medications, foods, dyes, or preservatives Pregnant or trying to get pregnant Breast-feeding How should I use this medication? This medication is injected into a vein. It is given by your care team in a hospital or clinic setting. Talk to your care team about the use of this medication in children. Special care may be needed. Overdosage: If you think you have taken too much of this medicine contact a poison control center or emergency room at once. NOTE: This medicine is only for you. Do not share this medicine with others. What if I miss a dose? Keep appointments for follow-up doses. It is important not to miss your dose. Call your care team if you are unable to keep an appointment. What may interact with this medication? Do not take this medication with any of the following: Live virus vaccines This medication may also interact with the following: Certain antibiotics, such as clarithromycin, telithromycin Certain antivirals for HIV or hepatitis Certain medications for fungal infections, such as itraconazole, ketoconazole, voriconazole Grapefruit juice Nefazodone Supplements, such as St. John's wort This list may not describe all possible interactions. Give your health care provider a list of all the medicines, herbs, non-prescription drugs, or dietary supplements you use. Also tell them if  you smoke, drink alcohol, or use illegal drugs. Some items may interact with your medicine. What should I watch for while using this medication? This medication may make you feel generally unwell. This is not uncommon as chemotherapy can affect healthy cells as well as cancer cells. Report any side effects. Continue your course of treatment even though you feel ill unless your care team tells you to stop. You may need blood work done while you are taking this medication. This medication can cause serious side effects and infusion reactions. To reduce the risk, your care team may give you other medications to take before receiving this one. Be sure to follow the directions from your care team. This medication may increase your risk of getting an infection. Call your care team for advice if you get a fever, chills, sore throat, or other symptoms of a cold or flu. Do not treat yourself. Try to avoid being around people who are sick. Avoid taking medications that contain aspirin, acetaminophen, ibuprofen, naproxen, or ketoprofen unless instructed by your care team. These medications may hide a fever. Be careful brushing or flossing your teeth or using a toothpick because you may get an infection or bleed more easily. If you have any dental work done, tell your dentist you are receiving this medication. Some products may contain alcohol. Ask your care team if this medication contains alcohol. Be sure to tell all care teams you are taking this medicine. Certain medications, like metronidazole and disulfiram, can cause an unpleasant reaction when taken with alcohol. The reaction includes flushing, headache, nausea, vomiting, sweating, and increased thirst. The reaction can last from 30 minutes to several hours.   This medication may affect your coordination, reaction time, or judgement. Do not drive or operate machinery until you know how this medication affects you. Sit up or stand slowly to reduce the risk of  dizzy or fainting spells. Drinking alcohol with this medication can increase the risk of these side effects. Talk to your care team about your risk of cancer. You may be more at risk for certain types of cancer if you take this medication. Talk to your care team if you wish to become pregnant or think you might be pregnant. This medication can cause serious birth defects if taken during pregnancy or if you get pregnant within 2 months after stopping therapy. A negative pregnancy test is required before starting this medication. A reliable form of contraception is recommended while taking this medication and for 2 months after stopping it. Talk to your care team about reliable forms of contraception. Do not breast-feed while taking this medication and for 1 week after stopping therapy. Use a condom during sex and for 4 months after stopping therapy. Tell your care team right away if you think your partner might be pregnant. This medication can cause serious birth defects. This medication may cause infertility. Talk to your care team if you are concerned about your fertility. What side effects may I notice from receiving this medication? Side effects that you should report to your care team as soon as possible: Allergic reactions--skin rash, itching, hives, swelling of the face, lips, tongue, or throat Change in vision such as blurry vision, seeing halos around lights, vision loss Infection--fever, chills, cough, or sore throat Infusion reactions--chest pain, shortness of breath or trouble breathing, feeling faint or lightheaded Low red blood cell level--unusual weakness or fatigue, dizziness, headache, trouble breathing Pain, tingling, or numbness in the hands or feet Painful swelling, warmth, or redness of the skin, blisters or sores at the infusion site Redness, blistering, peeling, or loosening of the skin, including inside the mouth Sudden or severe stomach pain, bloody diarrhea, fever, nausea,  vomiting Swelling of the ankles, hands, or feet Tumor lysis syndrome (TLS)--nausea, vomiting, diarrhea, decrease in the amount of urine, dark urine, unusual weakness or fatigue, confusion, muscle pain or cramps, fast or irregular heartbeat, joint pain Unusual bruising or bleeding Side effects that usually do not require medical attention (report to your care team if they continue or are bothersome): Change in nail shape, thickness, or color Change in taste Hair loss Increased tears This list may not describe all possible side effects. Call your doctor for medical advice about side effects. You may report side effects to FDA at 1-800-FDA-1088. Where should I keep my medication? This medication is given in a hospital or clinic. It will not be stored at home. NOTE: This sheet is a summary. It may not cover all possible information. If you have questions about this medicine, talk to your doctor, pharmacist, or health care provider.  2023 [employer]/Gold Standard (2007-04-22 00:00:00)  

## 2022-07-19 ENCOUNTER — Telehealth: Payer: Self-pay | Admitting: Oncology

## 2022-07-19 ENCOUNTER — Inpatient Hospital Stay: Payer: No Typology Code available for payment source

## 2022-07-19 VITALS — BP 124/60 | HR 66 | Temp 98.0°F | Resp 18

## 2022-07-19 DIAGNOSIS — C61 Malignant neoplasm of prostate: Secondary | ICD-10-CM | POA: Diagnosis not present

## 2022-07-19 DIAGNOSIS — C7951 Secondary malignant neoplasm of bone: Secondary | ICD-10-CM

## 2022-07-19 MED ORDER — PEGFILGRASTIM-CBQV 6 MG/0.6ML ~~LOC~~ SOSY
6.0000 mg | PREFILLED_SYRINGE | Freq: Once | SUBCUTANEOUS | Status: AC
  Administered 2022-07-19: 6 mg via SUBCUTANEOUS
  Filled 2022-07-19: qty 0.6

## 2022-07-19 NOTE — Patient Instructions (Signed)

## 2022-07-19 NOTE — Telephone Encounter (Signed)
07/19/22 Spoke with patient and rescheduled appt on 5/23 and 5/24.Per scheduleding note

## 2022-07-26 ENCOUNTER — Other Ambulatory Visit: Payer: Self-pay

## 2022-07-26 ENCOUNTER — Encounter: Payer: Self-pay | Admitting: Oncology

## 2022-07-26 DIAGNOSIS — C61 Malignant neoplasm of prostate: Secondary | ICD-10-CM

## 2022-07-26 DIAGNOSIS — M898X9 Other specified disorders of bone, unspecified site: Secondary | ICD-10-CM

## 2022-07-26 DIAGNOSIS — C7951 Secondary malignant neoplasm of bone: Secondary | ICD-10-CM

## 2022-07-26 MED ORDER — MORPHINE SULFATE ER 15 MG PO TBCR
15.0000 mg | EXTENDED_RELEASE_TABLET | Freq: Two times a day (BID) | ORAL | 0 refills | Status: DC
Start: 2022-07-26 — End: 2022-08-24

## 2022-08-03 ENCOUNTER — Inpatient Hospital Stay (INDEPENDENT_AMBULATORY_CARE_PROVIDER_SITE_OTHER): Payer: No Typology Code available for payment source | Admitting: Hematology and Oncology

## 2022-08-03 ENCOUNTER — Encounter: Payer: Self-pay | Admitting: Hematology and Oncology

## 2022-08-03 ENCOUNTER — Inpatient Hospital Stay: Payer: No Typology Code available for payment source

## 2022-08-03 VITALS — BP 121/65 | HR 74 | Temp 98.5°F | Resp 20 | Ht 67.0 in | Wt 193.1 lb

## 2022-08-03 DIAGNOSIS — D72829 Elevated white blood cell count, unspecified: Secondary | ICD-10-CM

## 2022-08-03 DIAGNOSIS — H532 Diplopia: Secondary | ICD-10-CM

## 2022-08-03 DIAGNOSIS — C61 Malignant neoplasm of prostate: Secondary | ICD-10-CM

## 2022-08-03 DIAGNOSIS — C7952 Secondary malignant neoplasm of bone marrow: Secondary | ICD-10-CM

## 2022-08-03 DIAGNOSIS — T451X5A Adverse effect of antineoplastic and immunosuppressive drugs, initial encounter: Secondary | ICD-10-CM

## 2022-08-03 DIAGNOSIS — C7951 Secondary malignant neoplasm of bone: Secondary | ICD-10-CM

## 2022-08-03 DIAGNOSIS — D61810 Antineoplastic chemotherapy induced pancytopenia: Secondary | ICD-10-CM

## 2022-08-03 LAB — CBC WITH DIFFERENTIAL (CANCER CENTER ONLY)
Abs Immature Granulocytes: 0.4 10*3/uL — ABNORMAL HIGH (ref 0.00–0.07)
Basophils Absolute: 0.1 10*3/uL (ref 0.0–0.1)
Basophils Relative: 1 %
Eosinophils Absolute: 0 10*3/uL (ref 0.0–0.5)
Eosinophils Relative: 0 %
HCT: 39.9 % (ref 39.0–52.0)
Hemoglobin: 12.1 g/dL — ABNORMAL LOW (ref 13.0–17.0)
Immature Granulocytes: 2 %
Lymphocytes Relative: 21 %
Lymphs Abs: 4.3 10*3/uL — ABNORMAL HIGH (ref 0.7–4.0)
MCH: 26.8 pg (ref 26.0–34.0)
MCHC: 30.3 g/dL (ref 30.0–36.0)
MCV: 88.5 fL (ref 80.0–100.0)
Monocytes Absolute: 1.4 10*3/uL — ABNORMAL HIGH (ref 0.1–1.0)
Monocytes Relative: 7 %
Neutro Abs: 14 10*3/uL — ABNORMAL HIGH (ref 1.7–7.7)
Neutrophils Relative %: 69 %
Platelet Count: 286 10*3/uL (ref 150–400)
RBC: 4.51 MIL/uL (ref 4.22–5.81)
RDW: 17.4 % — ABNORMAL HIGH (ref 11.5–15.5)
WBC Count: 20.2 10*3/uL — ABNORMAL HIGH (ref 4.0–10.5)
nRBC: 0.1 % (ref 0.0–0.2)

## 2022-08-03 LAB — CMP (CANCER CENTER ONLY)
ALT: 22 U/L (ref 0–44)
AST: 25 U/L (ref 15–41)
Albumin: 3.8 g/dL (ref 3.5–5.0)
Alkaline Phosphatase: 77 U/L (ref 38–126)
Anion gap: 10 (ref 5–15)
BUN: 14 mg/dL (ref 8–23)
CO2: 25 mmol/L (ref 22–32)
Calcium: 9.6 mg/dL (ref 8.9–10.3)
Chloride: 104 mmol/L (ref 98–111)
Creatinine: 0.97 mg/dL (ref 0.61–1.24)
GFR, Estimated: >60 mL/min (ref >60)
Glucose, Bld: 119 mg/dL — ABNORMAL HIGH (ref 70–99)
Potassium: 4.1 mmol/L (ref 3.5–5.1)
Sodium: 139 mmol/L (ref 135–145)
Total Bilirubin: 0.3 mg/dL (ref 0.3–1.2)
Total Protein: 7.5 g/dL (ref 6.5–8.1)

## 2022-08-03 LAB — PSA: Prostatic Specific Antigen: 261.12 ng/mL — ABNORMAL HIGH (ref 0.00–4.00)

## 2022-08-03 MED ORDER — TRIAMCINOLONE ACETONIDE 0.025 % EX CREA: 1.0000 | TOPICAL_CREAM | Freq: Two times a day (BID) | CUTANEOUS | 0 refills | Status: DC

## 2022-08-03 NOTE — Assessment & Plan Note (Addendum)
His hemoglobin dropped to 7 in March.  This was felt to be due to upper GI hemorrhage as he had melena.  He was admitted overnight and transfused 2 units packed red blood cells.  He was referred to Dr. [name] and an EGD was scheduled.  This was was canceled, as Dr. Allyson Sabal did not want him to stop clopidogrel for at least a year due to recent cardiac stent placement.  His hemoglobin is fairly stable today.  He denies recurrent melena.

## 2022-08-03 NOTE — Assessment & Plan Note (Addendum)
New blurry vision with intermittent diplopia.  Due to his constant ending history of metastatic prostate cancer, I will order an MRI brain to evaluate for intracranial metastasis.  I advised him to continue to hold the gabapentin, I doubt pantoprazole is the cause of his symptoms, so he can resume that once a day.  I advised him he should not be driving due to having the symptoms.  We will plan to see him back once the MRI is done.

## 2022-08-03 NOTE — Assessment & Plan Note (Addendum)
Stage IVA prostate cancer diagnosed in January 2016.  He had progressive disease in the bone and lung in September, so was placed on docetaxel chemotherapy and continued leuprolide and zoledronic acid.  His PSA has been decreasing, but is pending from today.  He has a significant rash of the face and neck likely due to docetaxel.  Due to this, will hold his chemotherapy this week and start triamcinolone cream twice daily.  If this is ineffective, he may need oral steroids.  He has new blurry vision and intermittent double vision.  Due to this, we will recommend MRI brain as soon as possible.  We will plan to see him back once that is done and potentially resume chemotherapy.

## 2022-08-03 NOTE — Assessment & Plan Note (Signed)
Secondary to Neulasta

## 2022-08-03 NOTE — Progress Notes (Signed)
[facility]  [address] [place],  [place]  [address] [phone]  Clinic Day:  08/03/2022  Referring physician: [name], MD  ASSESSMENT & PLAN:   Assessment & Plan: Malignant neoplasm of prostate [facility]) Stage IVA prostate cancer diagnosed in January 2016.  He had progressive disease in the bone and lung in September, so was placed on docetaxel chemotherapy and continued leuprolide and zoledronic acid.  His PSA has been decreasing, but is pending from today.  He has a significant rash of the face and neck likely due to docetaxel.  Due to this, will hold his chemotherapy this week and start triamcinolone cream twice daily.  If this is ineffective, he may need oral steroids.  He has new blurry vision and intermittent double vision.  Due to this, we will recommend MRI brain as soon as possible.  We will plan to see him back once that is done and potentially resume chemotherapy.  Anemia His hemoglobin dropped to 7 in March.  This was felt to be due to upper GI hemorrhage as he had melena.  He was admitted overnight and transfused 2 units packed red blood cells.  He was referred to Dr. [name] and an EGD was scheduled.  This was was canceled, as Dr. Allyson Sabal did not want him to stop clopidogrel for at least a year due to recent cardiac stent placement.  His hemoglobin is fairly stable today.  He denies recurrent melena.  Diplopia New blurry vision with intermittent diplopia.  Due to his constant ending history of metastatic prostate cancer, I will order an MRI brain to evaluate for intracranial metastasis.  I advised him to continue to hold the gabapentin, I doubt pantoprazole is the cause of his symptoms, so he can resume that once a day.  I advised him he should not be driving due to having the symptoms.  We will plan to see him back once the MRI is done.  Leucocytosis Secondary to Neulasta    The patient understands the plans discussed today and  is in agreement with them.  He knows to contact our office if he develops concerns prior to his next appointment.   I provided 20 minutes of face-to-face time during this encounter and > 50% was spent counseling as documented under my assessment and plan.    Adah Perl, PA-C  [facility] [address] [place] [address] Dept: [phone] Dept Fax: [phone]   Orders Placed This Encounter  Procedures   MR BRAIN W WO CONTRAST    Standing Status:   Future    Standing Expiration Date:   08/03/2023    Scheduling Instructions:     RH    Order Specific Question:   If indicated for the ordered procedure, I authorize the administration of contrast media per Radiology protocol    Answer:   Yes    Order Specific Question:   What is the patient's sedation requirement?    Answer:   No Sedation    Order Specific Question:   Does the patient have a pacemaker or implanted devices?    Answer:   No    Order Specific Question:   Radiology Contrast Protocol - do NOT remove file path    Answer:   [email or web address]    Order Specific Question:   Preferred imaging location?    Answer:   External      CHIEF COMPLAINT:  CC: Stage  IV prostate cancer  Current Treatment: Docetaxel/leuprolide/zoledronic acid.  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 He was initially treated with leuprolide 22.5 mg, and zoledronic acid every 3 months.  Enzalutamide was added in June 2018.  His bone pain has been controlled with MS Contin 15 mg twice daily with occasional Tylenol for breakthrough.  He had a steadily rising PSA, up to 83.49 in November 2022.  PSMA PET in October 2022 revealed an oligometastasis skeletal lesion it in the anterior right 7th rib, with increased activity, and  very subtle sclerotic change at T10, but no evidence of progressive disease.The PSA has continued to fluctuate up and down.  It then increased dramatically to 334 in August.  PSMA PET scan revealed significant progression of his metastatic bone disease now innumerable including major lesions at L3, right iliac wing and left pubis with several tiny lung nodules.  Enzalutamide was discontinued and he was started on docetaxel chemotherapy in September 2023.  He has tolerated this fairly well.  He continues leuprolide and zoledronic acid every 3 months.  His PSA went down from 536 to 400.75 after 3 cycles and went down to 284 with the 5th cycle, but then back up to 406 with his 6th cycle. In February his PSA went down to 235 and then 203 in March 2024. Therefore, we have continued docetaxel.  He then developed severe anemia with a hemoglobin of 7 in March requiring transfusion.  This was felt to be due to the upper GI hemorrhage as he reported melena.  EGD was not able to be done as Dr. Allyson Sabal wanted him to continue clopidogrel for at least another year before holding it.  Chemotherapy was on hold during that time.   Oncology History  Malignant neoplasm of prostate (HCC)  03/30/2014 Cancer Staging   Staging form: Prostate, AJCC 8th Edition - Clinical stage from 03/30/2014: Stage IVA (cT3b, cN1, cM0, PSA: 51.8, Grade Group: 4) - Signed by [name], MD on 11/14/2020 Histopathologic type: Adenocarcinoma, NOS Stage prefix: Initial diagnosis Prostate specific antigen (PSA) range: 20 or greater Gleason primary pattern: 4 Gleason secondary pattern: 4 Gleason score: 8 Histologic grading system: 5 grade system Location of positive needle core biopsies: Beyond primary site Prognostic indicators: May 2016 Robotic prostatectomy with mult. Pos margins, extra prostatic extension, 2/6 pos nodes. Scan June 2016 positive at T10  PSA up to 216.3 by August Placed on lupron and casodex Stage used in treatment  planning: Yes National guidelines used in treatment planning: Yes Type of national guideline used in treatment planning: NCCN   02/18/2020 Initial Diagnosis   Malignant neoplasm of prostate (HCC)   12/01/2021 -  Chemotherapy   Patient is on Treatment Plan : PROSTATE Docetaxel (73) + Prednisone q21d     Secondary malignant neoplasm of bone and bone marrow (HCC)  02/18/2020 Initial Diagnosis   Secondary malignant neoplasm of bone and bone marrow (HCC)   12/01/2021 -  Chemotherapy   Patient is on Treatment Plan : PROSTATE Docetaxel (75) + Prednisone q21d         INTERVAL HISTORY:  Marvin Johnson is here today for repeat clinical assessment prior to 9th cycle of docetaxel.  He reports significant fatigue since starting back on chemotherapy.  He denies recurrent melena.  He reports blurry vision and more recently intermittent double vision, which he attributes to pantoprazole and/or gabapentin.  He states he  stopped gabapentin last week and pantoprazole last night, without resolution of his symptoms.  He also reports feeling sleepy behind the wheel, but he pulls over to rest.  He occasionally feels off balance and uses a cane.  He denies other neurologic symptoms such as headache, trouble talking or swallowing, paresthesias, focal weakness, nausea or vomiting.  I advised him he should not be driving with the symptoms.  He reports rash of his face and neck.  He has been using something he was given in the hospital for the rash.  He does not know what this is.  He has chronic constipation for which he uses MiraLAX.  He denies fevers or chills. He denies pain. His appetite is good. His weight has been stable.  REVIEW OF SYSTEMS:  Review of Systems  Constitutional:  Positive for fatigue. Negative for appetite change, chills, fever and unexpected weight change.  HENT:   Negative for lump/mass, mouth sores, sore throat and trouble swallowing.   Eyes:  Positive for eye problems (Blurry and double vision).   Respiratory:  Negative for cough and shortness of breath.   Cardiovascular:  Negative for chest pain and leg swelling.  Gastrointestinal:  Negative for abdominal pain, constipation, diarrhea, nausea and vomiting.  Genitourinary:  Negative for difficulty urinating, dysuria, frequency and hematuria.   Musculoskeletal:  Negative for arthralgias, back pain and myalgias.  Skin:  Negative for itching, rash and wound.  Neurological:  Negative for dizziness, extremity weakness, headaches, light-headedness and numbness.  Hematological:  Negative for adenopathy.  Psychiatric/Behavioral:  Negative for depression and sleep disturbance. The patient is not nervous/anxious.      VITALS:  Blood pressure 121/65, pulse 74, temperature 98.5 F (36.9 C), temperature source Oral, resp. rate 20, height 5\' 7"  (1.702 m), weight 193 lb 1.6 oz (87.6 kg), SpO2 95 %.  Wt Readings from Last 3 Encounters:  08/03/22 193 lb 1.6 oz (87.6 kg)  07/16/22 193 lb 1.9 oz (87.6 kg)  07/13/22 194 lb 8 oz (88.2 kg)    Body mass index is 30.24 kg/m.  Performance status (ECOG): 1 - Symptomatic but completely ambulatory  PHYSICAL EXAM:  Physical Exam Vitals and nursing note reviewed.  Constitutional:      General: He is not in acute distress.    Appearance: Normal appearance. He is normal weight.  HENT:     Head: Normocephalic and atraumatic.     Mouth/Throat:     Mouth: Mucous membranes are moist.     Pharynx: Oropharynx is clear. No oropharyngeal exudate or posterior oropharyngeal erythema.  Eyes:     General: No scleral icterus.    Extraocular Movements: Extraocular movements intact.     Conjunctiva/sclera: Conjunctivae normal.     Pupils: Pupils are equal, round, and reactive to light.  Cardiovascular:     Rate and Rhythm: Normal rate and regular rhythm.     Heart sounds: Normal heart sounds. No murmur heard.    No friction rub. No gallop.  Pulmonary:     Effort: Pulmonary effort is normal.     Breath sounds:  Normal breath sounds. No wheezing, rhonchi or rales.  Abdominal:     General: There is no distension.     Palpations: Abdomen is soft. There is no hepatomegaly, splenomegaly or mass.     Tenderness: There is no abdominal tenderness.  Musculoskeletal:        General: Normal range of motion.     Cervical back: Normal range of motion and neck supple. No  tenderness.     Right lower leg: No edema.     Left lower leg: No edema.  Lymphadenopathy:     Cervical: No cervical adenopathy.     Upper Body:     Right upper body: No supraclavicular or axillary adenopathy.     Left upper body: No supraclavicular or axillary adenopathy.     Lower Body: No right inguinal adenopathy. No left inguinal adenopathy.  Skin:    General: Skin is warm and dry.     Coloration: Skin is not jaundiced.     Findings: No rash.  Neurological:     Mental Status: He is alert and oriented to person, place, and time.     Cranial Nerves: Cranial nerves 2-12 are intact. No cranial nerve deficit.     Sensory: Sensation is intact.     Motor: Motor function is intact.     Gait: Gait abnormal.  Psychiatric:        Mood and Affect: Mood normal.        Behavior: Behavior normal.        Thought Content: Thought content normal.     LABS:      Latest Ref Rng & Units 08/03/2022   10:35 AM 07/13/2022   12:00 AM 06/11/2022   12:00 AM  CBC  WBC 4.0 - 10.5 K/uL 20.2  11.7     12.9      Hemoglobin 13.0 - 17.0 g/dL 16.1  09.6     04.5      Hematocrit 39.0 - 52.0 % 39.9  38     35      Platelets 150 - 400 K/uL 286  299     287         This result is from an external source.      Latest Ref Rng & Units 08/03/2022   10:35 AM 07/13/2022   12:00 AM 06/11/2022    8:05 AM  CMP  Glucose 70 - 99 mg/dL 409   95   BUN 8 - 23 mg/dL 14  21     19    Creatinine 0.61 - 1.24 mg/dL 8.11  0.8     9.14   Sodium 135 - 145 mmol/L 139  140     139   Potassium 3.5 - 5.1 mmol/L 4.1  4.5     4.3   Chloride 98 - 111 mmol/L 104  106     103   CO2  22 - 32 mmol/L 25  29     26    Calcium 8.9 - 10.3 mg/dL 9.6  9.6     9.2   Total Protein 6.5 - 8.1 g/dL 7.5   7.4   Total Bilirubin 0.3 - 1.2 mg/dL 0.3   0.5   Alkaline Phos 38 - 126 U/L 77  74     54   AST 15 - 41 U/L 25  49     20   ALT 0 - 44 U/L 22  20     19       This result is from an external source.     No results found for: "CEA1", "CEA" / No results found for: "CEA1", "CEA" Lab Results  Component Value Date   PSA1 334.0 (H) 10/30/2021   No results found for: "NWG956" No results found for: "CAN125"  No results found for: "TOTALPROTELP", "ALBUMINELP", "A1GS", "A2GS", "BETS", "BETA2SER", "GAMS", "MSPIKE", "SPEI"  Lab Results  Component Value Date   TIBC 398  01/29/2022   FERRITIN 206 01/29/2022   IRONPCTSAT 25 01/29/2022   No results found for: "LDH"  STUDIES:  No results found.    HISTORY:   Past Medical History:  Diagnosis Date   Hyperlipidemia    Malignant neoplasm of prostate (HCC)    Supraventricular tachycardia     Past Surgical History:  Procedure Laterality Date   CORONARY STENT INTERVENTION N/A 02/08/2022   Procedure: CORONARY STENT INTERVENTION;  Surgeon: Yvonne Kendall, MD;  Location: [facility] INVASIVE CV LAB;  Service: Cardiovascular;  Laterality: N/A;   LEFT HEART CATH AND CORONARY ANGIOGRAPHY N/A 02/08/2022   Procedure: LEFT HEART CATH AND CORONARY ANGIOGRAPHY;  Surgeon: Yvonne Kendall, MD;  Location: [facility] INVASIVE CV LAB;  Service: Cardiovascular;  Laterality: N/A;   PROSTATECTOMY  07/2014   TONSILLECTOMY      Family History  Problem Relation Age of Onset   Breast cancer Mother 70   Ovarian cancer Mother 5   Breast cancer Paternal Grandmother 42    Social History:  reports that he has never smoked. He has never used smokeless tobacco. He reports that he does not currently use alcohol. He reports that he does not use drugs.The patient is alone today.  Allergies:  Allergies  Allergen Reactions   Atorvastatin    Rosuvastatin Other (See  Comments)    Other reaction(s): Muscle pain   Simvastatin Other (See Comments)    Other reaction(s): Joint pain, Muscle pain   Sulfa Antibiotics    Sulfamethoxazole-Trimethoprim Other (See Comments)    Unknown as to interaction was a baby.    Current Medications: Current Outpatient Medications  Medication Sig Dispense Refill   benzonatate (TESSALON) 200 MG capsule Take by mouth.     gabapentin (NEURONTIN) 300 MG capsule      triamcinolone (KENALOG) 0.025 % cream Apply 1 Application topically 2 (two) times daily. 30 g 0   Alirocumab (PRALUENT Leon) Inject into the skin. One injection every 2 weeks Monday     APPLE CIDER VINEGAR PO Take by mouth.     Cholecalciferol 25 MCG (1000 UT) tablet Take 1,000 Units by mouth in the morning and at bedtime.     clopidogrel (PLAVIX) 75 MG tablet Take 75 mg by mouth daily.     dexamethasone (DECADRON) 4 MG tablet Take 8 mg by mouth See admin instructions. Take two tablets by mouth twice a day (start the day before chemo, then take daily for 2 days starting te day after chemo; take with food)     diphenhydrAMINE (BENADRYL) 25 mg capsule Take 1 capsule by mouth at bedtime.     diphenhydrAMINE (BENADRYL) 50 MG capsule Take 50 mg by mouth at bedtime as needed.     ezetimibe (ZETIA) 10 MG tablet Take 10 mg by mouth daily.     isosorbide mononitrate (IMDUR) 30 MG 24 hr tablet Take 1 tablet by mouth every 8 (eight) hours as needed.     leuprolide (LUPRON) 22.5 MG injection Inject 22.5 mg into the muscle every 3 (three) months.     metoprolol tartrate (LOPRESSOR) 25 MG tablet Take 12.5 mg by mouth 2 (two) times daily.     morphine (MS CONTIN) 15 MG 12 hr tablet Take 1 tablet (15 mg total) by mouth every 12 (twelve) hours. 60 tablet 0   Multiple Vitamins-Minerals (MULTIVITAMIN WITH MINERALS) tablet Take 1 tablet by mouth daily.     nitroGLYCERIN (NITROSTAT) 0.4 MG SL tablet Place 0.4 mg under the tongue every 5 (five) minutes as  needed for chest pain. Repeat every  5 minutes if needed for a total of 3 tablets in 15 minutes. If no relief, CALL 911.     Omega-3 Fatty Acids (FISH OIL) 1000 MG CAPS Take 2,000 mg by mouth in the morning and at bedtime.     pantoprazole (PROTONIX) 40 MG tablet Take 1 tablet (40 mg total) by mouth 2 (two) times daily. 60 tablet 5   polyethylene glycol powder (GLYCOLAX/MIRALAX) 17 GM/SCOOP powder Take 1 Container by mouth once.     predniSONE (DELTASONE) 5 MG tablet Take 1 tablet (5 mg total) by mouth in the morning and at bedtime. 60 tablet 11   Royal Jelly-Bee Pollen-Ginseng (KOREAN GINSENG COMPLEX) 150-250-50 MG CAPS Take 1 capsule by mouth daily.     Zoledronic Acid (ZOMETA) 4 MG/100ML IVPB Inject 4 mg into the vein every 3 (three) months.     No current facility-administered medications for this visit.

## 2022-08-04 NOTE — Progress Notes (Addendum)
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 He has been on Enzalutamide 160 mg daily, as well as leuprolide 22.5 mg, and zoledronic acid every 3 months.  His pain is controlled with MS Contin 15 mg twice daily with occasional Tylenol for breakthrough.   He was on Enzalutamide since June 2018.  He then had a steadily rising PSA, up to 83.49 in November 2022.  PSMA PET in October 2022 revealed an oligometastasis skeletal lesion it in the anterior right 7th rib, with increased activity, and very subtle sclerotic change at T10, but no evidence of progressive disease.The PSA has continued to fluctuate up and down.  It was down to 31 in February of 2023, then back up to 78 in May.  It then increased dramatically to 334 in August and he was scheduled for a PSMA PET scan.  This revealed severe progression of disease with several tiny lung nodules which did have hypermetabolic activity, with SUV up to 5.  He had increased bone lesions which are now innumerable, including major lesions at L3, the right iliac wing, and the left pubic bone. These were not visible on CT scan. He stopped the Enzalutamide and started the Taxotere chemotherapy in September 2023 and is tolerating this well. His PSA went down from 536 to 400.75 after 3 cycles and went down to 284 with the 5th cycle, but then back up to 406 with his 6th cycle. In February his PSA went down to 235 and then 203 in March, 2024. The most recent one went up to 261 and I think it is time to stop his chemotherapy and give him a break.  We will continue Lupron and Zometa every 3 months.   Secondary malignant neoplasm of bone and bone marrow (HCC) He continues zoledronic acid every 3 months  in addition to his cancer therapy. He had marked progression of his bone metastases in August of 2023, but minimal symptoms.   Anemia. He had an upper GI hemorrhage earlier this month and was referred to Dr. Jennye Johnson. Fortunately he just had the one episode and was transfused. His hemoglobin came up to 11.1 and his hemoglobin remained stable at 11.3. He had a EGD scheduled on 07/09/2022 but it was canceled. His cardiologist Dr. Allyson Johnson is not comfortable with him stopping Plavix for another year. I don't know whether he realizes we were just requesting it for a few days in order to do the procedure, however, his bleeding has stopped and so we will not pursue an EGD at this time unless he begins having melena again and/or a drop in his hemoglobin. It is stable at 12.1 today.  Leukocytosis He has a recent upper respiratory infection which is now improving.    Plan:  His MRI of the head on 08/04/2022 revealed no evidence of acute intracranial abnormality. His PSA went up from >150.00 to 261.12 and I suggested he stop taking Taxotere and take a break from treatment. I described different chemotherapy options and the pros and cons but right now I believe surveillance with Lupron and Zometa is the best option for now. He will continue to take Lupron and Zometa injections every 3 months. I will see him back in 2 months with CBC, CMP, and PSA. He  will be due for his next Lupron and Zometa. We will plan scans later in the year. His companion Marvin Johnson seems relieved with this news. The patient understands the plans discussed today and is in agreement with them.  He knows to contact our office if he develops concerns prior to his next appointment.  His questions were answered.   I provided 25 minutes of face-to-face time during this encounter and > 50% was spent counseling as documented under my assessment and plan.   Marvin Beckwith, MD  [facility] [address] [place] [address] Dept: [phone] Dept Fax: [phone]   No orders of the defined types were placed in this encounter.   I have reviewed this report as typed by the medical scribe, and it is complete and accurate.  Marvin Johnson   08/19/22  6:42 PM     CHIEF COMPLAINT:  CC: Metastatic prostate cancer to bone, progressive  Current Treatment: Leuprolide/zoledronic acid and docetaxel  HISTORY OF PRESENT ILLNESS:  Marvin Johnson is a 72 year old male with a history of stage IV (T3b N1 M0) prostate cancer diagnosed in January 2016 when he was found to have a PSA of 51.8.  He was treated with a laparoscopic prostatectomy in May 2016.  Pathology revealed adenocarcinoma with a Gleason 8.  There was a positive posterior margin with extraprostatic extension, as well as left retro-urethral tissue positive and extension to the seminal vesicles bilaterally with multiple positive margins.  Two lymph nodes from the right side were negative, but 2/4 lymph nodes from the left side were positive for metastasis. Repeat bone scan in June 2016 revealed a new lesion at T10, which was not present on his baseline bone scan.  PSA at Dr. Cindee Johnson office in early August 2016 remained elevated at 56.4.   We began seeing him in August 2016. The PSA was up to 216.3 in late August 2016, so hormonal therapy was recommended  X-rays of the thoracic spine revealed osteoarthritis and scoliosis, as well as evidence of demineralization, but we could not visualize a metastatic lesion.[name] He started leuprolide in September 2016.  The PSA initially dropped steadily with leuprolide.  Bone density scan in November 2016 revealed osteopenia, with a T-score of -2.1 in the spine and a T-score of -0.9 in the femur.  He had been taking vitamin-D 1000 international units daily, but not calcium, so calcium 600 mg twice daily was added at that time.    Leuprolide was held in February 2017 because of chest  pain.  EKG was unremarkable.  He went to the [place] and he was referred to the [facility] for placement of 2 coronary artery stents for 95% and 99% occlusions.  He then had a third coronary artery stent placed later in the summer.  Leuprolide was resumed in March 2017.  In September 2017, he had an increase in the PSA, so we repeated a bone scan.  This revealed intense uptake within the right posterior elements of T10, but no other areas of metastasis.  We then added bicalutamide 50 mg daily to his leuprolide.     He had a steadily increasing PSA despite the bicalutamide, so this was discontinued in March 2018.  He eventually was placed on MS Contin 15 mg twice daily, with oxycodone 10 mg every 4 hours as needed for breakthrough pain.  MRI thoracic spine in April 2018 revealed increasing tumor at T10 with epidural spread.  Bone scan at that time revealed increased activity in the T10 lesion, which was stable.  There was a questionable tiny focus in the right ischium/acetabular rim.  Plain x-rays of the bilateral hips and pelvis did not reveal any focal abnormality. The PSA increased from 27.5 in March, to 72.2 in May, then to 92.2 in June 2018.  He was referred for radiotherapy to the T10 lesion due to the severe pain with improvement in his pain. He was started on zoledronic acid along with his leuprolide in June 2018.  He had a Port-A-Cath placed in anticipation of chemotherapy, but then we recommended that he be placed on enzalutamide 160 mg daily instead, in order to delay the time to initiation of chemotherapy.  He started enzalutamide in June 2018.  The PSA became undetectable in October 2018.  We continued MS Contin, but discontinued the oxycodone. He did not tolerate meloxicam, so uses Tylenol as needed for breakthrough pain.     He had a stress test in January 2020 and was referred to [facility] for coronary artery stenting, which was done in February.  Zoledronic acid had been given monthly  for over 2 years, so was changed to every 3 months in October 2020.   The PSA started to increase in January 2021 at which time it was 0.2.  CT chest, abdomen and pelvis in March 2021 not reveal any lymphadenopathy or soft tissue metastatic disease.  Hepatic steatosis was seen.  There was a stable sclerotic osseous lesion of the T10 vertebral body, as well as a subtle sclerotic lesion of the L3 vertebral body, also possibly a small metastatic lesion.  Whole body bone scan did not reveal continued increased activity of T10 and right acetabulum posteriorly.  The PSA was 0.5 in March.  PSA had increased to 3.0 in June.  Axumin PET imaging from July did not reveal any evidence of active prostate carcinoma, local recurrence, or metastatic disease. The PSA continued to slowly rise and was up to 5.9 in September, 10.9 in October, and 16.3 in December.     The PSA went up to 23.81 in March 2022, so an Axumin PET scan was ordered which revealed an oligometastasis skeletal lesion in the anterior right 7th rib with very subtle sclerotic change at this level. PSA in June decreased to 18.6, but then in September increased to 58.  In view of the significant rise in his PSA, a prostate specific PET scan was ordered.  PSMA PET in October 2022 revealed a persistent focus of intense radiotracer activity in the anterior RIGHT seventh rib with increased in activity from comparison exam and subtle sclerotic changes on CT. Findings remain consistent with oligometastatic skeletal metastasis. Sclerotic lesions in the T10 vertebral body without radiotracer activity potentially represented prior treated prostate cancer metastasis.  We recommended he continue his current treatment with enzalutamide, leuprolide and zoledronic acid.  PSA in November continued to increase to 83.49.  Even though his PSA continued to steadily increase, we did not recommend a change in therapy based on this alone.  He had no severe pain and no increase in pain  and was having a good quality of life.    The PSA has continued to fluctuate up and down and was 31 in February and 78 in May of 2023. t then increased dramatically to 334 in August and he was scheduled for a PSMA PET scan.  This revealed severe progression of disease with several tiny lung nodules which  did have hypermetabolic activity, with SUV up to 5.  He had increased bone lesions which are now innumerable, including major lesions at L3, the right iliac wing, and the left pubic bone. These were not visible on CT scan. He stopped the Enzalutamide and started the Taxotere chemotherapy in September 2023 and is tolerating this well. His PSA went down from 536 to 400.75 after 3 cycles and went down to 284 with the 5th cycle. It was down to 203 by March of 2024. He then had a gastrointestinal hemorrhage with melena and drop of his hemoglobin from 12 to 7.0 on March 11th. He was hospitalized for transfusion but EGD was not done due to needing to stay on Plavix after his most recent MI.   Oncology History  Malignant neoplasm of prostate (HCC)  03/30/2014 Cancer Staging   Staging form: Prostate, AJCC 8th Edition - Clinical stage from 03/30/2014: Stage IVA (cT3b, cN1, cM0, PSA: 51.8, Grade Group: 4) - Signed by Marvin Beckwith, MD on 11/14/2020 Histopathologic type: Adenocarcinoma, NOS Stage prefix: Initial diagnosis Prostate specific antigen (PSA) range: 20 or greater Gleason primary pattern: 4 Gleason secondary pattern: 4 Gleason score: 8 Histologic grading system: 5 grade system Location of positive needle core biopsies: Beyond primary site Prognostic indicators: May 2016 Robotic prostatectomy with mult. Pos margins, extra prostatic extension, 2/6 pos nodes. Scan June 2016 positive at T10  PSA up to 216.3 by August Placed on lupron and casodex Stage used in treatment planning: Yes National guidelines used in treatment planning: Yes Type of national guideline used in treatment planning: NCCN    02/18/2020 Initial Diagnosis   Malignant neoplasm of prostate (HCC)   12/01/2021 - 07/19/2022 Chemotherapy   Patient is on Treatment Plan : PROSTATE Docetaxel (73) + Prednisone q21d     Secondary malignant neoplasm of bone and bone marrow (HCC)  02/18/2020 Initial Diagnosis   Secondary malignant neoplasm of bone and bone marrow (HCC)   12/01/2021 - 07/19/2022 Chemotherapy   Patient is on Treatment Plan : PROSTATE Docetaxel (75) + Prednisone q21d         INTERVAL HISTORY:  Marvin Johnson is here today for repeat clinical assessment for progressive metastatic prostate cancer to bone. Patient states that he feels well and had chest congestion and was prescribed Amoxicillin 3 pills daily for 5 days.  He informed me that his vision is better and thinks it was because of his glasses. He has a appointment with the ophthalmologist in the summer. His MRI of the head on 08/04/2022 revealed no evidence of acute intracranial abnormality. His PSA went up from >150.00 to 261.12 and I suggested he stop taking Taxotere and take a break from treatment. I described different chemotherapy options and the pros and cons but right now I believe surveillance with Lupron and Zometa is the best option for now. He will continue to take Lupron and Zometa injections every 3 months. I will see him back in 2 months with CBC, CMP, and PSA. He will be due for his next Lupron and Zometa. His companion Marvin Johnson seems relieved with this news. He denies signs of infection such as sore throat, sinus drainage, cough, or urinary symptoms.  He denies fevers or recurrent chills. He denies pain. He denies nausea, vomiting, chest pain, dyspnea or cough. His appetite is good and his weight has been stable. This patient is accompanied in the office by his  friend .  REVIEW OF SYSTEMS:  Review of Systems  Constitutional: Negative.  Negative for  appetite change, chills, diaphoresis, fatigue, fever and unexpected weight change.  HENT:  Negative.  Negative for  hearing loss, lump/mass, mouth sores, nosebleeds, sore throat, tinnitus, trouble swallowing and voice change.   Eyes: Negative.  Negative for eye problems and icterus.  Respiratory: Negative.  Negative for chest tightness, cough, hemoptysis, shortness of breath and wheezing.   Cardiovascular: Negative.  Negative for chest pain, leg swelling and palpitations.  Gastrointestinal: Negative.  Negative for abdominal distention, abdominal pain, blood in stool, constipation, diarrhea, nausea, rectal pain and vomiting.  Endocrine: Negative.  Negative for hot flashes.  Genitourinary: Negative.  Negative for bladder incontinence, difficulty urinating, dyspareunia, dysuria, frequency, hematuria, nocturia, pelvic pain and penile discharge.   Musculoskeletal:  Positive for arthralgias (chronic). Negative for back pain, flank pain, gait problem, myalgias, neck pain and neck stiffness.  Skin: Negative.  Negative for itching, rash and wound.  Neurological: Negative.  Negative for dizziness, extremity weakness, gait problem, headaches, light-headedness, numbness, seizures and speech difficulty.  Hematological: Negative.  Negative for adenopathy. Does not bruise/bleed easily.  Psychiatric/Behavioral: Negative.  Negative for confusion, decreased concentration, depression, sleep disturbance and suicidal ideas. The patient is not nervous/anxious.     VITALS:  Blood pressure 125/65, pulse 71, temperature 98.1 F (36.7 C), resp. rate 18, height 5\' 7"  (1.702 m), weight 193 lb 6.4 oz (87.7 kg), SpO2 94 %.  Wt Readings from Last 3 Encounters:  08/05/22 193 lb 6.4 oz (87.7 kg)  08/03/22 193 lb 1.6 oz (87.6 kg)  07/16/22 193 lb 1.9 oz (87.6 kg)    Body mass index is 30.29 kg/m.  Performance status (ECOG): 1 - Symptomatic but completely ambulatory  PHYSICAL EXAM:  Physical Exam Vitals and nursing note reviewed. Exam conducted with a chaperone present.  Constitutional:      General: He is not in acute distress.     Appearance: Normal appearance. He is normal weight. He is not ill-appearing, toxic-appearing or diaphoretic.  HENT:     Head: Normocephalic and atraumatic.     Right Ear: Tympanic membrane, ear canal and external ear normal. There is no impacted cerumen.     Left Ear: Tympanic membrane, ear canal and external ear normal. There is no impacted cerumen.     Nose: No congestion or rhinorrhea.     Mouth/Throat:     Mouth: Mucous membranes are moist.     Pharynx: Oropharynx is clear. No oropharyngeal exudate or posterior oropharyngeal erythema.  Eyes:     General: No scleral icterus.       Right eye: No discharge.        Left eye: No discharge.     Extraocular Movements: Extraocular movements intact.     Conjunctiva/sclera: Conjunctivae normal.     Pupils: Pupils are equal, round, and reactive to light.  Neck:     Vascular: No carotid bruit.  Cardiovascular:     Rate and Rhythm: Normal rate and regular rhythm.     Pulses: Normal pulses.     Heart sounds: Normal heart sounds. No murmur heard.    No friction rub. No gallop.  Pulmonary:     Effort: Pulmonary effort is normal. No respiratory distress.     Breath sounds: Normal breath sounds. No stridor. No wheezing, rhonchi or rales.  Chest:     Chest wall: No tenderness.  Abdominal:     General: Bowel sounds are normal. There is no distension.     Palpations: Abdomen is soft. There is no hepatomegaly, splenomegaly or mass.  Tenderness: There is no abdominal tenderness. There is no right CVA tenderness, left CVA tenderness, guarding or rebound.     Hernia: No hernia is present.  Musculoskeletal:        General: No swelling, tenderness, deformity or signs of injury. Normal range of motion.     Cervical back: Normal range of motion and neck supple. No rigidity or tenderness.     Right lower leg: No edema.     Left lower leg: No edema.  Lymphadenopathy:     Cervical: No cervical adenopathy.     Upper Body:     Right upper body: No  supraclavicular or axillary adenopathy.     Left upper body: No supraclavicular or axillary adenopathy.     Lower Body: No right inguinal adenopathy. No left inguinal adenopathy.  Skin:    General: Skin is warm and dry.     Coloration: Skin is not jaundiced or pale.     Findings: No bruising, erythema, lesion or rash.  Neurological:     General: No focal deficit present.     Mental Status: He is alert and oriented to person, place, and time. Mental status is at baseline.     Cranial Nerves: No cranial nerve deficit.     Sensory: No sensory deficit.     Motor: No weakness.     Coordination: Coordination normal.     Gait: Gait normal.     Deep Tendon Reflexes: Reflexes normal.  Psychiatric:        Mood and Affect: Mood normal.        Behavior: Behavior normal.        Thought Content: Thought content normal.        Judgment: Judgment normal.    LABS:       Latest Ref Rng & Units 08/03/2022   10:35 AM 07/13/2022   12:00 AM 06/11/2022   12:00 AM  CBC  WBC 4.0 - 10.5 K/uL 20.2  11.7     12.9      Hemoglobin 13.0 - 17.0 g/dL 16.1  09.6     04.5      Hematocrit 39.0 - 52.0 % 39.9  38     35      Platelets 150 - 400 K/uL 286  299     287         This result is from an external source.      Latest Ref Rng & Units 08/03/2022   10:35 AM 07/13/2022   12:00 AM 06/11/2022    8:05 AM  CMP  Glucose 70 - 99 mg/dL 409   95   BUN 8 - 23 mg/dL 14  21     19    Creatinine 0.61 - 1.24 mg/dL 8.11  0.8     9.14   Sodium 135 - 145 mmol/L 139  140     139   Potassium 3.5 - 5.1 mmol/L 4.1  4.5     4.3   Chloride 98 - 111 mmol/L 104  106     103   CO2 22 - 32 mmol/L 25  29     26    Calcium 8.9 - 10.3 mg/dL 9.6  9.6     9.2   Total Protein 6.5 - 8.1 g/dL 7.5   7.4   Total Bilirubin 0.3 - 1.2 mg/dL 0.3   0.5   Alkaline Phos 38 - 126 U/L 77  74     54   AST 15 -  41 U/L 25  49     20   ALT 0 - 44 U/L 22  20     19       This result is from an external source.   Component Ref Range & Units 1 d  ago (08/03/22) 1 mo ago (06/11/22) 2 mo ago (05/24/22) 3 mo ago (05/03/22) 4 mo ago (04/02/22) 4 mo ago (03/12/22) 5 mo ago (02/19/22)  Prostatic Specific Antigen 0.00 - 4.00 ng/mL 261.12 High  >150.00 High  VC, CM 203.44 High  CM 235.00 High  CM 406.00 High  CM 284.59 High  CM 403.39 High  CM     No results found for: "CEA1", "CEA" / No results found for: "CEA1", "CEA" Lab Results  Component Value Date   PSA1 334.0 (H) 10/30/2021    No results found for: "ZOX096" No results found for: "CAN125"  No results found for: "TOTALPROTELP", "ALBUMINELP", "A1GS", "A2GS", "BETS", "BETA2SER", "GAMS", "MSPIKE", "SPEI" Lab Results  Component Value Date   TIBC 398 01/29/2022   FERRITIN 206 01/29/2022   IRONPCTSAT 25 01/29/2022   No results found for: "LDH"  STUDIES:  Exam: 08/04/2022 MRI Head without and With Contrast Impression: No evidence of acute intracranial abnormality.   Exam: 06/29/2022 EKG Impression: N/A  Exam: 04/17/2022 CT abdomen and Pelvis without Contrast Impression: Decreased sensitivity exam secondary to lack of contrast and minimal motion degradation. Fluid-filled colon, suggesting a diarrheal state. No other explanation for patient's acute symptoms. Prostatectomy. Sclerotic lesions including within the anterior seventh right rib and right iliac are suspicious for progressive osseous metastasis. No findings of extraosseous metastatic disease within the abdomen or pelvis. New lingular nodules up to 7mm are indeterminate. Consider non-emergent, outpatient chest CT to evaluate for pulmonary metastasis. Small bowel non rotation. Hepatic Steatosis.    HISTORY:   Past Medical History:  Diagnosis Date   Hyperlipidemia    Malignant neoplasm of prostate (HCC)    Supraventricular tachycardia     Past Surgical History:  Procedure Laterality Date   CORONARY STENT INTERVENTION N/A 02/08/2022   Procedure: CORONARY STENT INTERVENTION;  Surgeon: Yvonne Kendall, MD;   Location: [facility] INVASIVE [facility];  Service: Cardiovascular;  Laterality: N/A;   LEFT HEART CATH AND CORONARY ANGIOGRAPHY N/A 02/08/2022   Procedure: LEFT HEART CATH AND CORONARY ANGIOGRAPHY;  Surgeon: Yvonne Kendall, MD;  Location: [facility] INVASIVE CV LAB;  Service: Cardiovascular;  Laterality: N/A;   PROSTATECTOMY  07/2014   TONSILLECTOMY      Family History  Problem Relation Age of Onset   Breast cancer Mother 42   Ovarian cancer Mother 55   Breast cancer Paternal Grandmother 72    Social History:  reports that he has never smoked. He has never used smokeless tobacco. He reports that he does not currently use alcohol. He reports that he does not use drugs.The patient is alone today.  Allergies:  Allergies  Allergen Reactions   Atorvastatin    Rosuvastatin Other (See Comments)    Other reaction(s): Muscle pain   Simvastatin Other (See Comments)    Other reaction(s): Joint pain, Muscle pain   Sulfa Antibiotics    Sulfamethoxazole-Trimethoprim Other (See Comments)    Unknown as to interaction was a baby.    Current Medications: Current Outpatient Medications  Medication Sig Dispense Refill   Alirocumab (PRALUENT [place]) Inject into the skin. One injection every 2 weeks Monday     APPLE CIDER VINEGAR PO Take by mouth.     benzonatate (TESSALON) 200 MG capsule  Take by mouth.     Cholecalciferol 25 MCG (1000 UT) tablet Take 1,000 Units by mouth in the morning and at bedtime.     clopidogrel (PLAVIX) 75 MG tablet Take 75 mg by mouth daily.     dexamethasone (DECADRON) 4 MG tablet Take 8 mg by mouth See admin instructions. Take two tablets by mouth twice a day (start the day before chemo, then take daily for 2 days starting te day after chemo; take with food)     diphenhydrAMINE (BENADRYL) 25 mg capsule Take 1 capsule by mouth at bedtime.     diphenhydrAMINE (BENADRYL) 50 MG capsule Take 50 mg by mouth at bedtime as needed.     ezetimibe (ZETIA) 10 MG tablet Take 10 mg by mouth daily.      isosorbide mononitrate (IMDUR) 30 MG 24 hr tablet Take 1 tablet by mouth every 8 (eight) hours as needed.     leuprolide (LUPRON) 22.5 MG injection Inject 22.5 mg into the muscle every 3 (three) months.     metoprolol tartrate (LOPRESSOR) 25 MG tablet Take 12.5 mg by mouth 2 (two) times daily.     morphine (MS CONTIN) 15 MG 12 hr tablet Take 1 tablet (15 mg total) by mouth every 12 (twelve) hours. 60 tablet 0   Multiple Vitamins-Minerals (MULTIVITAMIN WITH MINERALS) tablet Take 1 tablet by mouth daily.     nitroGLYCERIN (NITROSTAT) 0.4 MG SL tablet Place 0.4 mg under the tongue every 5 (five) minutes as needed for chest pain. Repeat every 5 minutes if needed for a total of 3 tablets in 15 minutes. If no relief, CALL 911.     Omega-3 Fatty Acids (FISH OIL) 1000 MG CAPS Take 2,000 mg by mouth in the morning and at bedtime.     polyethylene glycol powder (GLYCOLAX/MIRALAX) 17 GM/SCOOP powder Take 1 Container by mouth once.     Royal Jelly-Bee Pollen-Ginseng (K[place]AN GINSENG COMPLEX) 150-250-50 MG CAPS Take 1 capsule by mouth daily.     triamcinolone (KENALOG) 0.025 % cream Apply 1 Application topically 2 (two) times daily. 30 g 0   Zoledronic Acid (ZOMETA) 4 MG/100ML IVPB Inject 4 mg into the vein every 3 (three) months.     No current facility-administered medications for this visit.     I,Jasmine M Lassiter,acting as a scribe for Marvin Beckwith, MD.,have documented all relevant documentation on the behalf of Marvin Beckwith, MD,as directed by  Marvin Beckwith, MD while in the presence of Marvin Beckwith, MD.

## 2022-08-05 ENCOUNTER — Ambulatory Visit: Payer: Non-veteran care

## 2022-08-05 ENCOUNTER — Inpatient Hospital Stay (INDEPENDENT_AMBULATORY_CARE_PROVIDER_SITE_OTHER): Payer: No Typology Code available for payment source | Admitting: Oncology

## 2022-08-05 ENCOUNTER — Other Ambulatory Visit: Payer: Self-pay | Admitting: Oncology

## 2022-08-05 ENCOUNTER — Encounter: Payer: Self-pay | Admitting: Oncology

## 2022-08-05 VITALS — BP 125/65 | HR 71 | Temp 98.1°F | Resp 18 | Ht 67.0 in | Wt 193.4 lb

## 2022-08-05 DIAGNOSIS — C7951 Secondary malignant neoplasm of bone: Secondary | ICD-10-CM

## 2022-08-05 DIAGNOSIS — C61 Malignant neoplasm of prostate: Secondary | ICD-10-CM

## 2022-08-05 DIAGNOSIS — C7952 Secondary malignant neoplasm of bone marrow: Secondary | ICD-10-CM

## 2022-08-06 ENCOUNTER — Ambulatory Visit: Payer: Non-veteran care

## 2022-08-10 ENCOUNTER — Ambulatory Visit: Payer: Non-veteran care

## 2022-08-13 ENCOUNTER — Encounter: Payer: Self-pay | Admitting: Hematology and Oncology

## 2022-08-16 ENCOUNTER — Encounter: Payer: Self-pay | Admitting: Oncology

## 2022-08-19 ENCOUNTER — Encounter: Payer: Self-pay | Admitting: Oncology

## 2022-08-24 ENCOUNTER — Other Ambulatory Visit: Payer: Self-pay

## 2022-08-24 DIAGNOSIS — C61 Malignant neoplasm of prostate: Secondary | ICD-10-CM

## 2022-08-24 DIAGNOSIS — M898X9 Other specified disorders of bone, unspecified site: Secondary | ICD-10-CM

## 2022-08-24 DIAGNOSIS — C7951 Secondary malignant neoplasm of bone: Secondary | ICD-10-CM

## 2022-08-24 MED ORDER — MORPHINE SULFATE ER 15 MG PO TBCR
15.0000 mg | EXTENDED_RELEASE_TABLET | Freq: Two times a day (BID) | ORAL | 0 refills | Status: DC
Start: 2022-08-24 — End: 2022-09-20

## 2022-09-20 ENCOUNTER — Other Ambulatory Visit: Payer: Self-pay

## 2022-09-20 DIAGNOSIS — C61 Malignant neoplasm of prostate: Secondary | ICD-10-CM

## 2022-09-20 DIAGNOSIS — M898X9 Other specified disorders of bone, unspecified site: Secondary | ICD-10-CM

## 2022-09-20 DIAGNOSIS — C7951 Secondary malignant neoplasm of bone: Secondary | ICD-10-CM

## 2022-09-21 ENCOUNTER — Encounter: Payer: Self-pay | Admitting: Oncology

## 2022-09-21 MED ORDER — MORPHINE SULFATE ER 15 MG PO TBCR
15.0000 mg | EXTENDED_RELEASE_TABLET | Freq: Two times a day (BID) | ORAL | 0 refills | Status: DC
Start: 2022-09-21 — End: 2022-10-25

## 2022-10-25 ENCOUNTER — Other Ambulatory Visit: Payer: Self-pay

## 2022-10-25 DIAGNOSIS — C61 Malignant neoplasm of prostate: Secondary | ICD-10-CM

## 2022-10-25 DIAGNOSIS — C7951 Secondary malignant neoplasm of bone: Secondary | ICD-10-CM

## 2022-10-25 DIAGNOSIS — M898X9 Other specified disorders of bone, unspecified site: Secondary | ICD-10-CM

## 2022-10-25 MED ORDER — MORPHINE SULFATE ER 15 MG PO TBCR
15.0000 mg | EXTENDED_RELEASE_TABLET | Freq: Two times a day (BID) | ORAL | 0 refills | Status: DC
Start: 2022-10-25 — End: 2022-11-25

## 2022-11-11 NOTE — Progress Notes (Addendum)
ADDENDUM:  His PSA has increased from to 1156.86 and I called the patient to discuss.  He is still recovering from his COVID infection and ended up in the hospital for a few days with respiratory distress.  I will give him a couple weeks to recover and then get his Lupron and Zometa injections.  I will then schedule him to return in a few weeks with repeat CBC, CMP, PSA and PSMA PET scan.  We will then discuss treatment options.    [facility]  [address] 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 He has been on Enzalutamide 160 mg daily, as well as leuprolide 22.5 mg, and zoledronic acid every 3 months.  His pain is controlled with MS Contin 15 mg twice daily with occasional Tylenol for breakthrough.   He was on Enzalutamide since June 2018.  He then had a steadily rising PSA, up to 83.49 in November 2022.  PSMA PET in October 2022 revealed an oligometastasis skeletal lesion it in the anterior right 7th rib, with increased activity, and very subtle sclerotic change at T10, but no evidence of progressive disease.The PSA has continued to fluctuate up and down.  It was down to 31 in February of 2023, then back up to 78 in May.  It then increased dramatically to 334 in August and he was scheduled for a PSMA PET scan.  This revealed severe progression of disease with several tiny lung nodules which did have hypermetabolic activity, with SUV up to 5.  He had increased bone lesions which are now innumerable, including major lesions at L3, the right iliac wing, and the left pubic bone. These were not visible on CT scan. He stopped the Enzalutamide and started the Taxotere chemotherapy in September 2023 and was tolerating this well. His PSA went down from 536 to  400.75 after 3 cycles and went down to 284 with the 5th cycle, but then back up to 406 with his 6th cycle. In February his PSA went down to 235 and then 203 in March, 2024. The most recent one went up to 261 in May and I stopped his chemotherapy to give him a break.  We will continue Lupron and Zometa every 3 months but postpone this current dose for 1-2 weeks to allow recovery from his COVID. We will then plan monthly follow up and labs.   Secondary malignant neoplasm of bone and bone marrow (HCC) He continues zoledronic acid every 3 months in addition to his cancer therapy. He had marked progression of his bone metastases in August of 2023, but minimal symptoms, and was placed on Taxotere chemotherapy.   Anemia. He had an upper GI hemorrhage earlier this year and was referred to Dr. [name]. Fortunately he just had the one episode and was transfused. His hemoglobin came up to 11.1 and his hemoglobin remained stable at 11.3. He had a EGD scheduled on 07/09/2022 but it was canceled. His cardiologist Dr. Allyson Sabal is not comfortable with him stopping Plavix for another year. I don't know whether he realizes we were just requesting it for a few days in order to do the procedure, however, his bleeding has stopped and so we will not pursue an EGD at this time unless he begins having melena again and/or a drop in  his hemoglobin. It is stable at 12.1 today.  COVID infection  I am positive that he has COVID and will prescribe Paxlovid. We will postpone his injections by 1-2 weeks.  Plan:  I am positive that he has COVID and will prescribe Paxlovid. I am concerned as to whether his thoracic rib pain is related to COVID or to his known bone metastases, we will see how this evolves over time.  His CBC and CMP are pending. I will post-pone his injections by 1 week until he feels better. I will schedule his follow up after seeing his labs results. He will continue to take Lupron and Zometa injections every 3 months.  If he is doing well we can even plan 3 month follow-ups, but I may need to see him monthly due to his increasing PSA. The patient understands the plans discussed today and is in agreement with them.  He knows to contact our office if he develops concerns prior to his next appointment.  His questions were answered.   I provided 8 minutes of face-to-face time during this encounter and > 50% was spent counseling as documented under my assessment and plan.   [name], MD  [facility] [address] [place] [address] Dept: [phone] Dept Fax: [phone]   No orders of the defined types were placed in this encounter.   I have reviewed this report as typed by the medical scribe, and it is complete and accurate.  [name]   12/01/22  11:54 AM     CHIEF COMPLAINT:  CC: Metastatic prostate cancer to bone, progressive  Current Treatment: Leuprolide/zoledronic acid and docetaxel  HISTORY OF PRESENT ILLNESS:  Marvin Johnson is a 72 year old male with a history of stage IV (T3b N1 M0) prostate cancer diagnosed in January 2016 when he was found to have a PSA of 51.8.  He was treated with a laparoscopic prostatectomy in May 2016.  Pathology revealed adenocarcinoma with a Gleason 8.  There was a positive posterior margin with extraprostatic extension, as well as left retro-urethral tissue positive and extension to the seminal vesicles bilaterally with multiple positive margins.  Two lymph nodes from the right side were negative, but 2/4 lymph nodes from the left side were positive for metastasis. Repeat bone scan in June 2016 revealed a new lesion at T10, which was not present on his baseline bone scan.  PSA at Dr. Cindee Salt office in early August 2016 remained elevated at 56.4.   We began seeing him in August 2016. The PSA was up to 216.3 in late August 2016, so hormonal therapy was recommended  X-rays of the  thoracic spine revealed osteoarthritis and scoliosis, as well as evidence of demineralization, but we could not visualize a metastatic lesion.[name] He started leuprolide in September 2016.  The PSA initially dropped steadily with leuprolide.  Bone density scan in November 2016 revealed osteopenia, with a T-score of -2.1 in the spine and a T-score of -0.9 in the femur.  He had been taking vitamin-D 1000 international units daily, but not calcium, so calcium 600 mg twice daily was added at that time.    Leuprolide was held in February 2017 because of chest pain.  EKG was unremarkable.  He went to the [place] and he was referred to the [facility] for placement of 2 coronary artery stents for 95% and 99% occlusions.  He then had a third coronary artery stent placed later  in the summer.  Leuprolide was resumed in March 2017.  In September 2017, he had an increase in the PSA, so we repeated a bone scan.  This revealed intense uptake within the right posterior elements of T10, but no other areas of metastasis.  We then added bicalutamide 50 mg daily to his leuprolide.     He had a steadily increasing PSA despite the bicalutamide, so this was discontinued in March 2018.  He eventually was placed on MS Contin 15 mg twice daily, with oxycodone 10 mg every 4 hours as needed for breakthrough pain.  MRI thoracic spine in April 2018 revealed increasing tumor at T10 with epidural spread.  Bone scan at that time revealed increased activity in the T10 lesion, which was stable.  There was a questionable tiny focus in the right ischium/acetabular rim.  Plain x-rays of the bilateral hips and pelvis did not reveal any focal abnormality. The PSA increased from 27.5 in March, to 72.2 in May, then to 92.2 in June 2018.  He was referred for radiotherapy to the T10 lesion due to the severe pain with improvement in his pain. He was started on zoledronic acid along with his leuprolide in June 2018.  He had a Port-A-Cath placed in anticipation of  chemotherapy, but then we recommended that he be placed on enzalutamide 160 mg daily instead, in order to delay the time to initiation of chemotherapy.  He started enzalutamide in June 2018.  The PSA became undetectable in October 2018.  We continued MS Contin, but discontinued the oxycodone. He did not tolerate meloxicam, so uses Tylenol as needed for breakthrough pain.     He had a stress test in January 2020 and was referred to [facility] for coronary artery stenting, which was done in February.  Zoledronic acid had been given monthly for over 2 years, so was changed to every 3 months in October 2020.   The PSA started to increase in January 2021 at which time it was 0.2.  CT chest, abdomen and pelvis in March 2021 not reveal any lymphadenopathy or soft tissue metastatic disease.  Hepatic steatosis was seen.  There was a stable sclerotic osseous lesion of the T10 vertebral body, as well as a subtle sclerotic lesion of the L3 vertebral body, also possibly a small metastatic lesion.  Whole body bone scan did not reveal continued increased activity of T10 and right acetabulum posteriorly.  The PSA was 0.5 in March.  PSA had increased to 3.0 in June.  Axumin PET imaging from July did not reveal any evidence of active prostate carcinoma, local recurrence, or metastatic disease. The PSA continued to slowly rise and was up to 5.9 in September, 10.9 in October, and 16.3 in December.     The PSA went up to 23.81 in March 2022, so an Axumin PET scan was ordered which revealed an oligometastasis skeletal lesion in the anterior right 7th rib with very subtle sclerotic change at this level. PSA in June decreased to 18.6, but then in September increased to 58.  In view of the significant rise in his PSA, a prostate specific PET scan was ordered.  PSMA PET in October 2022 revealed a persistent focus of intense radiotracer activity in the anterior RIGHT seventh rib with increased in activity from comparison  exam and subtle sclerotic changes on CT. Findings remain consistent with oligometastatic skeletal metastasis. Sclerotic lesions in the T10 vertebral body without radiotracer activity potentially represented prior treated prostate cancer metastasis.  We recommended he continue his current treatment with enzalutamide, leuprolide and zoledronic acid.  PSA in November continued to increase to 83.49.  Even though his PSA continued to steadily increase, we did not recommend a change in therapy based on this alone.  He had no severe pain and no increase in pain and was having a good quality of life.    The PSA has continued to fluctuate up and down and was 31 in February and 78 in May of 2023. t then increased dramatically to 334 in August and he was scheduled for a PSMA PET scan.  This revealed severe progression of disease with several tiny lung nodules which did have hypermetabolic activity, with SUV up to 5.  He had increased bone lesions which are now innumerable, including major lesions at L3, the right iliac wing, and the left pubic bone. These were not visible on CT scan. He stopped the Enzalutamide and started the Taxotere chemotherapy in September 2023 and is tolerating this well. His PSA went down from 536 to 400.75 after 3 cycles and went down to 284 with the 5th cycle. It was down to 203 by March of 2024. He then had a gastrointestinal hemorrhage with melena and drop of his hemoglobin from 12 to 7.0 on March 11th. He was hospitalized for transfusion but EGD was not done due to needing to stay on Plavix after his most recent MI.   Oncology History  Malignant neoplasm of prostate (HCC)  03/30/2014 Cancer Staging   Staging form: Prostate, AJCC 8th Edition - Clinical stage from 03/30/2014: Stage IVA (cT3b, cN1, cM0, PSA: 51.8, Grade Group: 4) - Signed by [name], MD on 11/14/2020 Histopathologic type: Adenocarcinoma, NOS Stage prefix: Initial diagnosis Prostate specific antigen (PSA) range: 20  or greater Gleason primary pattern: 4 Gleason secondary pattern: 4 Gleason score: 8 Histologic grading system: 5 grade system Location of positive needle core biopsies: Beyond primary site Prognostic indicators: May 2016 Robotic prostatectomy with mult. Pos margins, extra prostatic extension, 2/6 pos nodes. Scan June 2016 positive at T10  PSA up to 216.3 by August Placed on lupron and casodex Stage used in treatment planning: Yes National guidelines used in treatment planning: Yes Type of national guideline used in treatment planning: NCCN   02/18/2020 Initial Diagnosis   Malignant neoplasm of prostate (HCC)   12/01/2021 - 07/19/2022 Chemotherapy   Patient is on Treatment Plan : PROSTATE Docetaxel (73) + Prednisone q21d     Secondary malignant neoplasm of bone and bone marrow (HCC)  02/18/2020 Initial Diagnosis   Secondary malignant neoplasm of bone and bone marrow (HCC)   12/01/2021 - 07/19/2022 Chemotherapy   Patient is on Treatment Plan : PROSTATE Docetaxel (75) + Prednisone q21d         INTERVAL HISTORY:  Marvin Johnson is here today for repeat clinical assessment for progressive metastatic prostate cancer to bone. Patient states that he has been exposed to COVID by his friend that lives with him on 11/08/2022 but has since then been isolated from him. Patient has been experiencing COVID symptoms such as cough associated with right lower ribcage pain, dry heaves, chills, nausea, body aches, an "stuffy head", and headaches. I am positive that he has COVID and will prescribe Paxlovid. I am concerned as to whether his thoracic rib pain is related to COVID or to his known bone metastases, we will see how this evolves over time.  His CBC and CMP are pending. I will post-pone his injections by 1 week  until he feels better. I will schedule his follow up after seeing his labs results. His last PSA had gone up to 261 in May. He will continue to take Lupron and Zometa injections every 3 months.  If he is  doing well we can even plan 3 month follow-ups, but I may need to see him monthly due to his increasing PSA. He denies sore throat, sinus drainage, or urinary symptoms.. He denies vomiting, dyspnea. His appetite is great and her weight has been stable.   REVIEW OF SYSTEMS:  Review of Systems  Constitutional:  Positive for chills. Negative for appetite change, diaphoresis, fatigue, fever and unexpected weight change.  HENT:  Negative.  Negative for hearing loss, lump/mass, mouth sores, nosebleeds, sore throat, tinnitus, trouble swallowing and voice change.   Eyes: Negative.  Negative for eye problems and icterus.  Respiratory:  Positive for cough (with congestion). Negative for chest tightness, hemoptysis, shortness of breath and wheezing.   Cardiovascular:  Positive for chest pain (right upper quadrant when coughing). Negative for leg swelling and palpitations.  Gastrointestinal:  Positive for nausea (dry heaves). Negative for abdominal distention, abdominal pain, blood in stool, constipation, diarrhea, rectal pain and vomiting.  Endocrine: Negative.  Negative for hot flashes.  Genitourinary: Negative.  Negative for bladder incontinence, difficulty urinating, dyspareunia, dysuria, frequency, hematuria, nocturia, pelvic pain and penile discharge.   Musculoskeletal:  Positive for arthralgias (chronic). Negative for back pain, flank pain, gait problem, myalgias, neck pain and neck stiffness.  Skin: Negative.  Negative for itching, rash and wound.  Neurological:  Positive for headaches. Negative for dizziness, extremity weakness, gait problem, light-headedness, numbness, seizures and speech difficulty.  Hematological: Negative.  Negative for adenopathy. Does not bruise/bleed easily.  Psychiatric/Behavioral: Negative.  Negative for confusion, decreased concentration, depression, sleep disturbance and suicidal ideas. The patient is not nervous/anxious.     VITALS:  Blood pressure (!) 148/77, pulse 89,  temperature 99.1 F (37.3 C), temperature source Oral, resp. rate 20, height 5\' 7"  (1.702 m), weight 193 lb 1.6 oz (87.6 kg), SpO2 97%.  Wt Readings from Last 3 Encounters:  11/26/22 195 lb (88.5 kg)  11/12/22 193 lb 1.6 oz (87.6 kg)  08/05/22 193 lb 6.4 oz (87.7 kg)    Body mass index is 30.24 kg/m.  Performance status (ECOG): 1 - Symptomatic but completely ambulatory  PHYSICAL EXAM:  Physical Exam Vitals and nursing note reviewed. Exam conducted with a chaperone present.  Constitutional:      General: He is not in acute distress.    Appearance: Normal appearance. He is normal weight. He is not ill-appearing, toxic-appearing or diaphoretic.  HENT:     Head: Normocephalic and atraumatic.     Right Ear: Tympanic membrane, ear canal and external ear normal. There is no impacted cerumen.     Left Ear: Tympanic membrane, ear canal and external ear normal. There is no impacted cerumen.     Nose: No congestion or rhinorrhea.     Mouth/Throat:     Mouth: Mucous membranes are moist.     Pharynx: Oropharynx is clear. No oropharyngeal exudate or posterior oropharyngeal erythema.  Eyes:     General: No scleral icterus.       Right eye: No discharge.        Left eye: No discharge.     Extraocular Movements: Extraocular movements intact.     Conjunctiva/sclera: Conjunctivae normal.     Pupils: Pupils are equal, round, and reactive to light.  Neck:  Vascular: No carotid bruit.  Cardiovascular:     Rate and Rhythm: Normal rate and regular rhythm.     Pulses: Normal pulses.     Heart sounds: Normal heart sounds. No murmur heard.    No friction rub. No gallop.  Pulmonary:     Effort: Pulmonary effort is normal. No respiratory distress.     Breath sounds: Normal breath sounds. No stridor. No wheezing, rhonchi or rales.  Chest:     Chest wall: No tenderness.  Abdominal:     General: Bowel sounds are normal. There is no distension.     Palpations: Abdomen is soft. There is no  hepatomegaly, splenomegaly or mass.     Tenderness: There is no abdominal tenderness. There is no right CVA tenderness, left CVA tenderness, guarding or rebound.     Hernia: No hernia is present.     Comments: Right lower ribcage pain on a lateral aspect  Musculoskeletal:        General: No swelling, tenderness, deformity or signs of injury. Normal range of motion.     Cervical back: Normal range of motion and neck supple. No rigidity or tenderness.     Right lower leg: Edema (trace) present.     Left lower leg: Edema (trace) present.  Lymphadenopathy:     Cervical: No cervical adenopathy.     Upper Body:     Right upper body: No supraclavicular or axillary adenopathy.     Left upper body: No supraclavicular or axillary adenopathy.     Lower Body: No right inguinal adenopathy. No left inguinal adenopathy.  Skin:    General: Skin is warm and dry.     Coloration: Skin is not jaundiced or pale.     Findings: No bruising, erythema, lesion or rash.  Neurological:     General: No focal deficit present.     Mental Status: He is alert and oriented to person, place, and time. Mental status is at baseline.     Cranial Nerves: No cranial nerve deficit.     Sensory: No sensory deficit.     Motor: No weakness.     Coordination: Coordination normal.     Gait: Gait normal.     Deep Tendon Reflexes: Reflexes normal.  Psychiatric:        Mood and Affect: Mood normal.        Behavior: Behavior normal.        Thought Content: Thought content normal.        Judgment: Judgment normal.    LABS:       Latest Ref Rng & Units 11/12/2022    8:25 AM 08/03/2022   10:35 AM 07/13/2022   12:00 AM  CBC  WBC 4.0 - 10.5 K/uL 13.6  20.2  11.7      Hemoglobin 13.0 - 17.0 g/dL 16.1  09.6  04.5      Hematocrit 39.0 - 52.0 % 43.2  39.9  38      Platelets 150 - 400 K/uL 218  286  299         This result is from an external source.      Latest Ref Rng & Units 11/12/2022    8:25 AM 08/03/2022   10:35 AM  07/13/2022   12:00 AM  CMP  Glucose 70 - 99 mg/dL 409  811    BUN 8 - 23 mg/dL 14  14  21       Creatinine 0.61 - 1.24 mg/dL 9.14  7.82  0.8      Sodium 135 - 145 mmol/L 139  139  140      Potassium 3.5 - 5.1 mmol/L 3.6  4.1  4.5      Chloride 98 - 111 mmol/L 102  104  106      CO2 22 - 32 mmol/L 26  25  29       Calcium 8.9 - 10.3 mg/dL 9.1  9.6  9.6      Total Protein 6.5 - 8.1 g/dL 7.8  7.5    Total Bilirubin 0.3 - 1.2 mg/dL 0.7  0.3    Alkaline Phos 38 - 126 U/L 97  77  74      AST 15 - 41 U/L 27  25  49      ALT 0 - 44 U/L 21  22  20          This result is from an external source.   Component Ref Range & Units 1 d ago (08/03/22) 1 mo ago (06/11/22) 2 mo ago (05/24/22) 3 mo ago (05/03/22) 4 mo ago (04/02/22) 4 mo ago (03/12/22) 5 mo ago (02/19/22)  Prostatic Specific Antigen 0.00 - 4.00 ng/mL 261.12 High  >150.00 High  VC, CM 203.44 High  CM 235.00 High  CM 406.00 High  CM 284.59 High  CM 403.39 High  CM     No results found for: "CEA1", "CEA" / No results found for: "CEA1", "CEA" Lab Results  Component Value Date   PSA1 334.0 (H) 10/30/2021    No results found for: "EXB284" No results found for: "CAN125"  No results found for: "TOTALPROTELP", "ALBUMINELP", "A1GS", "A2GS", "BETS", "BETA2SER", "GAMS", "MSPIKE", "SPEI" Lab Results  Component Value Date   TIBC 398 01/29/2022   FERRITIN 206 01/29/2022   IRONPCTSAT 25 01/29/2022   No results found for: "LDH"  STUDIES:  Exam: 08/04/2022 MRI Head without and With Contrast Impression: No evidence of acute intracranial abnormality.   Exam: 06/29/2022 EKG Impression: N/A  Exam: 04/17/2022 CT abdomen and Pelvis without Contrast Impression: Decreased sensitivity exam secondary to lack of contrast and minimal motion degradation. Fluid-filled colon, suggesting a diarrheal state. No other explanation for patient's acute symptoms. Prostatectomy. Sclerotic lesions including within the anterior seventh right rib and right  iliac are suspicious for progressive osseous metastasis. No findings of extraosseous metastatic disease within the abdomen or pelvis. New lingular nodules up to 7mm are indeterminate. Consider non-emergent, outpatient chest CT to evaluate for pulmonary metastasis. Small bowel non rotation. Hepatic Steatosis.    HISTORY:   Past Medical History:  Diagnosis Date   Hyperlipidemia    Malignant neoplasm of prostate (HCC)    Supraventricular tachycardia     Past Surgical History:  Procedure Laterality Date   CORONARY STENT INTERVENTION N/A 02/08/2022   Procedure: CORONARY STENT INTERVENTION;  Surgeon: Yvonne Kendall, MD;  Location: [facility] INVASIVE CV LAB;  Service: Cardiovascular;  Laterality: N/A;   LEFT HEART CATH AND CORONARY ANGIOGRAPHY N/A 02/08/2022   Procedure: LEFT HEART CATH AND CORONARY ANGIOGRAPHY;  Surgeon: Yvonne Kendall, MD;  Location: [facility] INVASIVE CV LAB;  Service: Cardiovascular;  Laterality: N/A;   PROSTATECTOMY  07/2014   TONSILLECTOMY      Family History  Problem Relation Age of Onset   Breast cancer Mother 89   Ovarian cancer Mother 63   Breast cancer Paternal Grandmother 29    Social History:  reports that he has never smoked. He has never used smokeless tobacco. He reports that he does not  currently use alcohol. He reports that he does not use drugs.The patient is alone today.  Allergies:  Allergies  Allergen Reactions   Atorvastatin    Rosuvastatin Other (See Comments)    Other reaction(s): Muscle pain   Simvastatin Other (See Comments)    Other reaction(s): Joint pain, Muscle pain   Sulfa Antibiotics    Sulfamethoxazole-Trimethoprim Other (See Comments)    Unknown as to interaction was a baby.    Current Medications: Current Outpatient Medications  Medication Sig Dispense Refill   Alirocumab (PRALUENT [place]) Inject into the skin. One injection every 2 weeks Monday     APPLE CIDER VINEGAR PO Take by mouth.     Cholecalciferol 25 MCG (1000 UT) tablet  Take 1,000 Units by mouth in the morning and at bedtime.     clopidogrel (PLAVIX) 75 MG tablet Take 75 mg by mouth daily.     dexamethasone (DECADRON) 4 MG tablet Take 8 mg by mouth See admin instructions. Take two tablets by mouth twice a day (start the day before chemo, then take daily for 2 days starting te day after chemo; take with food)     diphenhydrAMINE (BENADRYL) 25 mg capsule Take 1 capsule by mouth at bedtime.     diphenhydrAMINE (BENADRYL) 50 MG capsule Take 50 mg by mouth at bedtime as needed.     ezetimibe (ZETIA) 10 MG tablet Take 10 mg by mouth daily.     isosorbide mononitrate (IMDUR) 30 MG 24 hr tablet Take 1 tablet by mouth every 8 (eight) hours as needed.     leuprolide (LUPRON) 22.5 MG injection Inject 22.5 mg into the muscle every 3 (three) months.     metoprolol tartrate (LOPRESSOR) 25 MG tablet Take 12.5 mg by mouth 2 (two) times daily.     morphine (MS CONTIN) 15 MG 12 hr tablet Take 1 tablet (15 mg total) by mouth every 12 (twelve) hours. 60 tablet 0   Multiple Vitamins-Minerals (MULTIVITAMIN WITH MINERALS) tablet Take 1 tablet by mouth daily.     nirmatrelvir & ritonavir (PAXLOVID, 300/100,) 20 x 150 MG & 10 x 100MG  TBPK Take all 3 tablets by mouth from the blister card together. Take twice daily for 5 days. (Two of the 150mg  tablets and one of the 100mg  tablets). 30 tablet 0   nitroGLYCERIN (NITROSTAT) 0.4 MG SL tablet Place 0.4 mg under the tongue every 5 (five) minutes as needed for chest pain. Repeat every 5 minutes if needed for a total of 3 tablets in 15 minutes. If no relief, CALL 911.     Omega-3 Fatty Acids (FISH OIL) 1000 MG CAPS Take 2,000 mg by mouth in the morning and at bedtime.     polyethylene glycol powder (GLYCOLAX/MIRALAX) 17 GM/SCOOP powder Take 1 Container by mouth once.     Royal Jelly-Bee Pollen-Ginseng (KOREAN GINSENG COMPLEX) 150-250-50 MG CAPS Take 1 capsule by mouth daily.     triamcinolone (KENALOG) 0.025 % cream Apply 1 Application topically 2  (two) times daily. 30 g 0   Zoledronic Acid (ZOMETA) 4 MG/100ML IVPB Inject 4 mg into the vein every 3 (three) months.     No current facility-administered medications for this visit.     I,Jasmine M Lassiter,acting as a scribe for [name], MD.,have documented all relevant documentation on the behalf of [name], MD,as directed by  [name], MD while in the presence of [name], MD.

## 2022-11-12 ENCOUNTER — Inpatient Hospital Stay: Payer: Non-veteran care

## 2022-11-12 ENCOUNTER — Other Ambulatory Visit: Payer: Self-pay

## 2022-11-12 ENCOUNTER — Other Ambulatory Visit: Payer: Self-pay | Admitting: Oncology

## 2022-11-12 ENCOUNTER — Encounter: Payer: Self-pay | Admitting: Oncology

## 2022-11-12 ENCOUNTER — Inpatient Hospital Stay: Payer: No Typology Code available for payment source | Attending: Oncology | Admitting: Oncology

## 2022-11-12 ENCOUNTER — Telehealth: Payer: Self-pay

## 2022-11-12 VITALS — BP 148/77 | HR 89 | Temp 99.1°F | Resp 20 | Ht 67.0 in | Wt 193.1 lb

## 2022-11-12 DIAGNOSIS — C7952 Secondary malignant neoplasm of bone marrow: Secondary | ICD-10-CM

## 2022-11-12 DIAGNOSIS — C7951 Secondary malignant neoplasm of bone: Secondary | ICD-10-CM | POA: Insufficient documentation

## 2022-11-12 DIAGNOSIS — C7800 Secondary malignant neoplasm of unspecified lung: Secondary | ICD-10-CM

## 2022-11-12 DIAGNOSIS — C61 Malignant neoplasm of prostate: Secondary | ICD-10-CM | POA: Insufficient documentation

## 2022-11-12 LAB — CBC WITH DIFFERENTIAL (CANCER CENTER ONLY)
Abs Immature Granulocytes: 0.08 10*3/uL — ABNORMAL HIGH (ref 0.00–0.07)
Basophils Absolute: 0 10*3/uL (ref 0.0–0.1)
Basophils Relative: 0 %
Eosinophils Absolute: 0.1 10*3/uL (ref 0.0–0.5)
Eosinophils Relative: 1 %
HCT: 43.2 % (ref 39.0–52.0)
Hemoglobin: 13.1 g/dL (ref 13.0–17.0)
Immature Granulocytes: 1 %
Lymphocytes Relative: 22 %
Lymphs Abs: 2.9 10*3/uL (ref 0.7–4.0)
MCH: 27.9 pg (ref 26.0–34.0)
MCHC: 30.3 g/dL (ref 30.0–36.0)
MCV: 91.9 fL (ref 80.0–100.0)
Monocytes Absolute: 1.1 10*3/uL — ABNORMAL HIGH (ref 0.1–1.0)
Monocytes Relative: 8 %
Neutro Abs: 9.3 10*3/uL — ABNORMAL HIGH (ref 1.7–7.7)
Neutrophils Relative %: 68 %
Platelet Count: 218 10*3/uL (ref 150–400)
RBC: 4.7 MIL/uL (ref 4.22–5.81)
RDW: 15.5 % (ref 11.5–15.5)
WBC Count: 13.6 10*3/uL — ABNORMAL HIGH (ref 4.0–10.5)
nRBC: 0 % (ref 0.0–0.2)

## 2022-11-12 LAB — CMP (CANCER CENTER ONLY)
ALT: 21 U/L (ref 0–44)
AST: 27 U/L (ref 15–41)
Albumin: 4 g/dL (ref 3.5–5.0)
Alkaline Phosphatase: 97 U/L (ref 38–126)
Anion gap: 11 (ref 5–15)
BUN: 14 mg/dL (ref 8–23)
CO2: 26 mmol/L (ref 22–32)
Calcium: 9.1 mg/dL (ref 8.9–10.3)
Chloride: 102 mmol/L (ref 98–111)
Creatinine: 0.9 mg/dL (ref 0.61–1.24)
GFR, Estimated: >60 mL/min (ref >60)
Glucose, Bld: 109 mg/dL — ABNORMAL HIGH (ref 70–99)
Potassium: 3.6 mmol/L (ref 3.5–5.1)
Sodium: 139 mmol/L (ref 135–145)
Total Bilirubin: 0.7 mg/dL (ref 0.3–1.2)
Total Protein: 7.8 g/dL (ref 6.5–8.1)

## 2022-11-12 LAB — PSA: Prostatic Specific Antigen: 1156.86 ng/mL — ABNORMAL HIGH (ref 0.00–4.00)

## 2022-11-12 MED ORDER — PAXLOVID (300/100) 20 X 150 MG & 10 X 100MG PO TBPK: ORAL_TABLET | ORAL | 0 refills | Status: DC

## 2022-11-12 NOTE — Telephone Encounter (Signed)
Pt's friend,Mark Moshe Cipro, called and states, "I just saw the PSA results. They are 1186. It has never been that high. What we going to do  He doesn't know the results yet. I'm not telling him right now because he is sick. Dr Gilman Buttner probably hasn't even had time to see the PSA results. He can't keep anything down. I don't think I can get these big Paxlovid pills in him. I think I'm calling the non emergent 911 and have him brought to the hospital for fluids if he will agree". Just FYI    Pt did go to Er, admitted w/COVID, sepsis, dehydration. Discharged 11/14/2022.

## 2022-11-16 ENCOUNTER — Telehealth: Payer: Self-pay | Admitting: Oncology

## 2022-11-16 NOTE — Telephone Encounter (Signed)
Patient has been scheduled. Aware of appt date and time.   Scheduling Message Entered by Domenic Schwab on 11/13/2022 at  6:51 AM Priority: Routine <No visit type provided>  Department: [facility]  Provider:  Appointment Notes:  Please schedule pt for zometa and lupron on 9/6.  No new labs are needed.

## 2022-11-17 ENCOUNTER — Telehealth: Payer: Self-pay | Admitting: Oncology

## 2022-11-17 ENCOUNTER — Encounter: Payer: Self-pay | Admitting: Oncology

## 2022-11-17 NOTE — Telephone Encounter (Signed)
11/17/22 Spoke with patient and rescheduled infusion appt.

## 2022-11-19 ENCOUNTER — Ambulatory Visit: Payer: Non-veteran care

## 2022-11-19 ENCOUNTER — Telehealth: Payer: Self-pay | Admitting: Oncology

## 2022-11-19 ENCOUNTER — Other Ambulatory Visit: Payer: Self-pay | Admitting: Oncology

## 2022-11-19 DIAGNOSIS — C7800 Secondary malignant neoplasm of unspecified lung: Secondary | ICD-10-CM

## 2022-11-19 NOTE — Telephone Encounter (Signed)
11/19/22 Spoke with patient and confirmed next appts

## 2022-11-25 ENCOUNTER — Encounter: Payer: Self-pay | Admitting: Oncology

## 2022-11-25 ENCOUNTER — Other Ambulatory Visit: Payer: Self-pay

## 2022-11-25 DIAGNOSIS — C61 Malignant neoplasm of prostate: Secondary | ICD-10-CM

## 2022-11-25 DIAGNOSIS — C7951 Secondary malignant neoplasm of bone: Secondary | ICD-10-CM

## 2022-11-25 DIAGNOSIS — M898X9 Other specified disorders of bone, unspecified site: Secondary | ICD-10-CM

## 2022-11-25 MED ORDER — MORPHINE SULFATE ER 15 MG PO TBCR: 15.0000 mg | EXTENDED_RELEASE_TABLET | Freq: Two times a day (BID) | ORAL | 0 refills | Status: DC

## 2022-11-25 NOTE — Addendum Note (Signed)
Addended by: Domenic Schwab on: 11/25/2022 05:56 PM   Modules accepted: Orders

## 2022-11-26 ENCOUNTER — Inpatient Hospital Stay: Payer: No Typology Code available for payment source | Attending: Oncology

## 2022-11-26 ENCOUNTER — Encounter: Payer: Self-pay | Admitting: Oncology

## 2022-11-26 VITALS — BP 134/63 | HR 68 | Temp 97.7°F | Resp 20 | Ht 67.0 in | Wt 195.0 lb

## 2022-11-26 DIAGNOSIS — Z79818 Long term (current) use of other agents affecting estrogen receptors and estrogen levels: Secondary | ICD-10-CM | POA: Insufficient documentation

## 2022-11-26 DIAGNOSIS — Z79899 Other long term (current) drug therapy: Secondary | ICD-10-CM | POA: Diagnosis not present

## 2022-11-26 DIAGNOSIS — C61 Malignant neoplasm of prostate: Secondary | ICD-10-CM | POA: Diagnosis present

## 2022-11-26 MED ORDER — LEUPROLIDE ACETATE (3 MONTH) 22.5 MG IM KIT
22.5000 mg | PACK | Freq: Once | INTRAMUSCULAR | Status: AC
  Administered 2022-11-26: 22.5 mg via INTRAMUSCULAR

## 2022-11-26 MED ORDER — ZOLEDRONIC ACID 4 MG/100ML IV SOLN
4.0000 mg | Freq: Once | INTRAVENOUS | Status: AC
  Administered 2022-11-26: 4 mg via INTRAVENOUS
  Filled 2022-11-26: qty 100

## 2022-11-26 MED ORDER — SODIUM CHLORIDE 0.9 % IV SOLN: Freq: Once | INTRAVENOUS | Status: AC

## 2022-11-26 NOTE — Patient Instructions (Signed)
Zoledronic Acid Injection (Cancer) What is this medication? ZOLEDRONIC ACID (ZOE le dron ik AS id) treats high calcium levels in the blood caused by cancer. It may also be used with chemotherapy to treat weakened bones caused by cancer. It works by slowing down the release of calcium from bones. This lowers calcium levels in your blood. It also makes your bones stronger and less likely to break (fracture). It belongs to a group of medications called bisphosphonates. This medicine may be used for other purposes; ask your health care provider or pharmacist if you have questions. COMMON BRAND NAME(S): Zometa, Zometa Powder What should I tell my care team before I take this medication? They need to know if you have any of these conditions: Dehydration Dental disease Kidney disease Liver disease Low levels of calcium in the blood Lung or breathing disease, such as asthma Receiving steroids, such as dexamethasone or prednisone An unusual or allergic reaction to zoledronic acid, other medications, foods, dyes, or preservatives Pregnant or trying to get pregnant Breast-feeding How should I use this medication? This medication is injected into a vein. It is given by your care team in a hospital or clinic setting. Talk to your care team about the use of this medication in children. Special care may be needed. Overdosage: If you think you have taken too much of this medicine contact a poison control center or emergency room at once. NOTE: This medicine is only for you. Do not share this medicine with others. What if I miss a dose? Keep appointments for follow-up doses. It is important not to miss your dose. Call your care team if you are unable to keep an appointment. What may interact with this medication? Certain antibiotics given by injection Diuretics, such as bumetanide, furosemide NSAIDs, medications for pain and inflammation, such as ibuprofen or naproxen Teriparatide Thalidomide This list  may not describe all possible interactions. Give your health care provider a list of all the medicines, herbs, non-prescription drugs, or dietary supplements you use. Also tell them if you smoke, drink alcohol, or use illegal drugs. Some items may interact with your medicine. What should I watch for while using this medication? Visit your care team for regular checks on your progress. It may be some time before you see the benefit from this medication. Some people who take this medication have severe bone, joint, or muscle pain. This medication may also increase your risk for jaw problems or a broken thigh bone. Tell your care team right away if you have severe pain in your jaw, bones, joints, or muscles. Tell you care team if you have any pain that does not go away or that gets worse. Tell your dentist and dental surgeon that you are taking this medication. You should not have major dental surgery while on this medication. See your dentist to have a dental exam and fix any dental problems before starting this medication. Take good care of your teeth while on this medication. Make sure you see your dentist for regular follow-up appointments. You should make sure you get enough calcium and vitamin D while you are taking this medication. Discuss the foods you eat and the vitamins you take with your care team. Check with your care team if you have severe diarrhea, nausea, and vomiting, or if you sweat a lot. The loss of too much body fluid may make it dangerous for you to take this medication. You may need bloodwork while taking this medication. Talk to your care team if  you wish to become pregnant or think you might be pregnant. This medication can cause serious birth defects. What side effects may I notice from receiving this medication? Side effects that you should report to your care team as soon as possible: Allergic reactions--skin rash, itching, hives, swelling of the face, lips, tongue, or  throat Kidney injury--decrease in the amount of urine, swelling of the ankles, hands, or feet Low calcium level--muscle pain or cramps, confusion, tingling, or numbness in the hands or feet Osteonecrosis of the jaw--pain, swelling, or redness in the mouth, numbness of the jaw, poor healing after dental work, unusual discharge from the mouth, visible bones in the mouth Severe bone, joint, or muscle pain Side effects that usually do not require medical attention (report to your care team if they continue or are bothersome): Constipation Fatigue Fever Loss of appetite Nausea Stomach pain This list may not describe all possible side effects. Call your doctor for medical advice about side effects. You may report side effects to FDA at 1-800-FDA-1088. Where should I keep my medication? This medication is given in a hospital or clinic. It will not be stored at home. NOTE: This sheet is a summary. It may not cover all possible information. If you have questions about this medicine, talk to your doctor, pharmacist, or health care provider.  2024 Elsevier/Gold Standard (2021-04-24 00:00:00) Leuprolide Solution for Injection What is this medication? LEUPROLIDE (loo PROE lide) reduces the symptoms of prostate cancer. It works by decreasing levels of the hormone testosterone in the body. This prevents prostate cancer cells from spreading or growing. This medicine may be used for other purposes; ask your health care provider or pharmacist if you have questions. COMMON BRAND NAME(S): Lupron What should I tell my care team before I take this medication? They need to know if you have any of these conditions: Diabetes Heart attack Heart disease High blood pressure High cholesterol Pain or difficulty passing urine Spinal cord metastasis Stroke Tobacco use An unusual or allergic reaction to leuprolide, other medications, foods, dyes, or preservatives Pregnant or trying to get  pregnant Breast-feeding How should I use this medication? This medication is for injection under the skin or into a muscle. You will be taught how to prepare and give this medication. Use exactly as directed. Take your medication at regular intervals. Do not take it more often than directed. It is important that you put your used needles and syringes in a special sharps container. Do not put them in a trash can. If you do not have a sharps container, call your care team to get one. A special MedGuide will be given to you by the pharmacist with each prescription and refill. Be sure to read this information carefully each time. Talk to your care team about the use of this medication in children. While this medication may be prescribed for children as young as 8 years for selected conditions, precautions do apply. Overdosage: If you think you have taken too much of this medicine contact a poison control center or emergency room at once. NOTE: This medicine is only for you. Do not share this medicine with others. What if I miss a dose? If you miss a dose, take it as soon as you can. If it is almost time for your next dose, take only that dose. Do not take double or extra doses. What may interact with this medication? Do not take this medication with any of the following: Chasteberry Cisapride Dronedarone Pimozide Thioridazine This medication  may also interact with the following: Estrogen or progestin hormones Herbal or dietary supplements, like black cohosh or DHEA Other medications that cause heart rhythm changes Testosterone This list may not describe all possible interactions. Give your health care provider a list of all the medicines, herbs, non-prescription drugs, or dietary supplements you use. Also tell them if you smoke, drink alcohol, or use illegal drugs. Some items may interact with your medicine. What should I watch for while using this medication? Visit your care team for regular  checks on your progress. During the first week, your symptoms may get worse, but then will improve as you continue your treatment. You may get hot flashes, increased bone pain, increased difficulty passing urine, or an aggravation of nerve symptoms. Discuss these effects with your care team, some of them may improve with continued use of this medication. Patients may experience a menstrual cycle or spotting during the first 2 months of therapy with this medication. If this continues, contact your care team. This medication may increase blood sugar. The risk may be higher in patients who already have diabetes. Ask your care team what you can do to lower your risk of diabetes while taking this medication. What side effects may I notice from receiving this medication? Side effects that you should report to your care team as soon as possible: Allergic reactions--skin rash, itching, hives, swelling of the face, lips, tongue, or throat Heart attack--pain or tightness in the chest, shoulders, arms, or jaw, nausea, shortness of breath, cold or clammy skin, feeling faint or lightheaded Heart rhythm changes--fast or irregular heartbeat, dizziness, feeling faint or lightheaded, chest pain, trouble breathing High blood sugar (hyperglycemia)--increased thirst or amount of urine, unusual weakness or fatigue, blurry vision New or worsening seizures Redness, blistering, peeling, or loosening of the skin, including inside the mouth Stroke--sudden numbness or weakness of the face, arm, or leg, trouble speaking, confusion, trouble walking, loss of balance or coordination, dizziness, severe headache, change in vision Swelling and pain of the tumor site or lymph nodes Side effects that usually do not require medical attention (report these to your care team if they continue or are bothersome): Change in sex drive or performance Hot flashes Joint pain Pain, redness, or irritation at injection site Swelling of the  ankles, hands, or feet Unusual weakness or fatigue This list may not describe all possible side effects. Call your doctor for medical advice about side effects. You may report side effects to FDA at 1-800-FDA-1088. Where should I keep my medication? Keep out of the reach of children and pets. Store below 25 degrees C (77 degrees F). Do not freeze. Protect from light. Get rid of any unused medication after the expiration date. To get rid of medications that are no longer needed or have expired: Take the medication to a medication take-back program. Check with your pharmacy or law enforcement to find a location. If you cannot return the medication, ask your pharmacist or care team how to get rid of this medication safely. NOTE: This sheet is a summary. It may not cover all possible information. If you have questions about this medicine, talk to your doctor, pharmacist, or health care provider.  2024 Elsevier/Gold Standard (2022-07-20 00:00:00)

## 2022-12-01 ENCOUNTER — Encounter: Payer: Self-pay | Admitting: Oncology

## 2022-12-03 ENCOUNTER — Inpatient Hospital Stay: Payer: No Typology Code available for payment source

## 2022-12-03 ENCOUNTER — Encounter: Payer: Self-pay | Admitting: Oncology

## 2022-12-03 DIAGNOSIS — C61 Malignant neoplasm of prostate: Secondary | ICD-10-CM

## 2022-12-03 LAB — CMP (CANCER CENTER ONLY)
ALT: 27 U/L (ref 0–44)
AST: 25 U/L (ref 15–41)
Albumin: 3.7 g/dL (ref 3.5–5.0)
Alkaline Phosphatase: 90 U/L (ref 38–126)
Anion gap: 11 (ref 5–15)
BUN: 23 mg/dL (ref 8–23)
CO2: 26 mmol/L (ref 22–32)
Calcium: 8.9 mg/dL (ref 8.9–10.3)
Chloride: 101 mmol/L (ref 98–111)
Creatinine: 0.8 mg/dL (ref 0.61–1.24)
GFR, Estimated: >60 mL/min (ref >60)
Glucose, Bld: 114 mg/dL — ABNORMAL HIGH (ref 70–99)
Potassium: 3.6 mmol/L (ref 3.5–5.1)
Sodium: 138 mmol/L (ref 135–145)
Total Bilirubin: 0.8 mg/dL (ref 0.3–1.2)
Total Protein: 7.1 g/dL (ref 6.5–8.1)

## 2022-12-03 LAB — CBC WITH DIFFERENTIAL (CANCER CENTER ONLY)
Abs Immature Granulocytes: 0.09 10*3/uL — ABNORMAL HIGH (ref 0.00–0.07)
Basophils Absolute: 0 10*3/uL (ref 0.0–0.1)
Basophils Relative: 0 %
Eosinophils Absolute: 0.2 10*3/uL (ref 0.0–0.5)
Eosinophils Relative: 1 %
HCT: 40.8 % (ref 39.0–52.0)
Hemoglobin: 12.5 g/dL — ABNORMAL LOW (ref 13.0–17.0)
Immature Granulocytes: 1 %
Lymphocytes Relative: 33 %
Lymphs Abs: 3.6 10*3/uL (ref 0.7–4.0)
MCH: 28.2 pg (ref 26.0–34.0)
MCHC: 30.6 g/dL (ref 30.0–36.0)
MCV: 91.9 fL (ref 80.0–100.0)
Monocytes Absolute: 0.8 10*3/uL (ref 0.1–1.0)
Monocytes Relative: 7 %
Neutro Abs: 6.2 10*3/uL (ref 1.7–7.7)
Neutrophils Relative %: 58 %
Platelet Count: 236 10*3/uL (ref 150–400)
RBC: 4.44 MIL/uL (ref 4.22–5.81)
RDW: 15.2 % (ref 11.5–15.5)
WBC Count: 10.9 10*3/uL — ABNORMAL HIGH (ref 4.0–10.5)
nRBC: 0 % (ref 0.0–0.2)

## 2022-12-04 LAB — MISC LABCORP TEST (SEND OUT): Labcorp test code: 10322

## 2022-12-06 ENCOUNTER — Ambulatory Visit (HOSPITAL_COMMUNITY)
Admission: RE | Admit: 2022-12-06 | Discharge: 2022-12-06 | Disposition: A | Discharge status: Home / Self Care | Payer: No Typology Code available for payment source | Source: Ambulatory Visit | Attending: Oncology | Admitting: Oncology

## 2022-12-06 DIAGNOSIS — C61 Malignant neoplasm of prostate: Secondary | ICD-10-CM | POA: Insufficient documentation

## 2022-12-06 DIAGNOSIS — C7800 Secondary malignant neoplasm of unspecified lung: Secondary | ICD-10-CM | POA: Insufficient documentation

## 2022-12-06 MED ORDER — FLOTUFOLASTAT F 18 GALLIUM 296-5846 MBQ/ML IV SOLN
8.0000 | Freq: Once | INTRAVENOUS | Status: AC
  Administered 2022-12-06: 8.41 via INTRAVENOUS

## 2022-12-10 ENCOUNTER — Other Ambulatory Visit: Payer: Self-pay

## 2022-12-14 ENCOUNTER — Other Ambulatory Visit: Payer: Self-pay | Admitting: Oncology

## 2022-12-14 ENCOUNTER — Inpatient Hospital Stay: Payer: No Typology Code available for payment source | Attending: Oncology | Admitting: Oncology

## 2022-12-14 ENCOUNTER — Inpatient Hospital Stay: Payer: No Typology Code available for payment source

## 2022-12-14 ENCOUNTER — Encounter: Payer: Self-pay | Admitting: Oncology

## 2022-12-14 VITALS — BP 131/70 | HR 84 | Resp 18 | Ht 67.0 in | Wt 194.0 lb

## 2022-12-14 DIAGNOSIS — D61810 Antineoplastic chemotherapy induced pancytopenia: Secondary | ICD-10-CM

## 2022-12-14 DIAGNOSIS — C7951 Secondary malignant neoplasm of bone: Secondary | ICD-10-CM | POA: Diagnosis not present

## 2022-12-14 DIAGNOSIS — C7952 Secondary malignant neoplasm of bone marrow: Secondary | ICD-10-CM

## 2022-12-14 DIAGNOSIS — C7800 Secondary malignant neoplasm of unspecified lung: Secondary | ICD-10-CM

## 2022-12-14 DIAGNOSIS — Z5189 Encounter for other specified aftercare: Secondary | ICD-10-CM | POA: Insufficient documentation

## 2022-12-14 DIAGNOSIS — T451X5A Adverse effect of antineoplastic and immunosuppressive drugs, initial encounter: Secondary | ICD-10-CM

## 2022-12-14 DIAGNOSIS — Z5111 Encounter for antineoplastic chemotherapy: Secondary | ICD-10-CM | POA: Diagnosis present

## 2022-12-14 DIAGNOSIS — C61 Malignant neoplasm of prostate: Secondary | ICD-10-CM

## 2022-12-14 LAB — CBC WITH DIFFERENTIAL (CANCER CENTER ONLY)
Abs Immature Granulocytes: 0.17 10*3/uL — ABNORMAL HIGH (ref 0.00–0.07)
Basophils Absolute: 0.1 10*3/uL (ref 0.0–0.1)
Basophils Relative: 0 %
Eosinophils Absolute: 0.1 10*3/uL (ref 0.0–0.5)
Eosinophils Relative: 1 %
HCT: 40.6 % (ref 39.0–52.0)
Hemoglobin: 12.5 g/dL — ABNORMAL LOW (ref 13.0–17.0)
Immature Granulocytes: 2 %
Lymphocytes Relative: 27 %
Lymphs Abs: 3.1 10*3/uL (ref 0.7–4.0)
MCH: 27.6 pg (ref 26.0–34.0)
MCHC: 30.8 g/dL (ref 30.0–36.0)
MCV: 89.6 fL (ref 80.0–100.0)
Monocytes Absolute: 1 10*3/uL (ref 0.1–1.0)
Monocytes Relative: 9 %
Neutro Abs: 7.1 10*3/uL (ref 1.7–7.7)
Neutrophils Relative %: 61 %
Platelet Count: 262 10*3/uL (ref 150–400)
RBC: 4.53 MIL/uL (ref 4.22–5.81)
RDW: 15.3 % (ref 11.5–15.5)
WBC Count: 11.5 10*3/uL — ABNORMAL HIGH (ref 4.0–10.5)
nRBC: 0 % (ref 0.0–0.2)

## 2022-12-14 LAB — CMP (CANCER CENTER ONLY)
ALT: 20 U/L (ref 0–44)
AST: 30 U/L (ref 15–41)
Albumin: 3.9 g/dL (ref 3.5–5.0)
Alkaline Phosphatase: 123 U/L (ref 38–126)
Anion gap: 8 (ref 5–15)
BUN: 16 mg/dL (ref 8–23)
CO2: 25 mmol/L (ref 22–32)
Calcium: 9.1 mg/dL (ref 8.9–10.3)
Chloride: 103 mmol/L (ref 98–111)
Creatinine: 0.81 mg/dL (ref 0.61–1.24)
GFR, Estimated: >60 mL/min (ref >60)
Glucose, Bld: 162 mg/dL — ABNORMAL HIGH (ref 70–99)
Potassium: 3.4 mmol/L — ABNORMAL LOW (ref 3.5–5.1)
Sodium: 136 mmol/L (ref 135–145)
Total Bilirubin: 0.3 mg/dL (ref 0.3–1.2)
Total Protein: 7.5 g/dL (ref 6.5–8.1)

## 2022-12-14 MED ORDER — TRIAMCINOLONE ACETONIDE 0.025 % EX CREA: 1.0000 | TOPICAL_CREAM | Freq: Two times a day (BID) | CUTANEOUS | 0 refills | Status: DC

## 2022-12-14 MED ORDER — PREDNISONE 5 MG PO TABS
5.0000 mg | ORAL_TABLET | Freq: Two times a day (BID) | ORAL | 5 refills | Status: DC
Start: 2022-12-14 — End: 2023-06-06

## 2022-12-14 NOTE — Progress Notes (Signed)
DISCONTINUE ON PATHWAY REGIMEN - Prostate ? ? ?  A cycle is every 21 days: ?    Prednisone  ?    Docetaxel  ? ?**Always confirm dose/schedule in your pharmacy ordering system** ? ?REASON: Disease Progression ?PRIOR TREATMENT: POS37: Docetaxel 75 mg/m2 q21 Days + Prednisone 5 mg BID Until Progression or Toxicity ?TREATMENT RESPONSE: Partial Response (PR) ? ?START ON PATHWAY REGIMEN - Prostate ? ? ?  A cycle is every 21 days.: ?    Cabazitaxel  ?    Prednisone  ? ?**Always confirm dose/schedule in your pharmacy ordering system** ? ?Patient Characteristics: ?Adenocarcinoma, Recurrent/New Systemic Disease (Including Biochemical Recurrence), Castration Resistant, M1, Prior Novel Hormonal Agent, No Molecular Alteration or Targeted Therapy Exhausted, Prior Docetaxel/Docetaxel Not Indicated ?Histology: Adenocarcinoma ?Therapeutic Status: Recurrent/New Systemic Disease (Including Biochemical Recurrence) ? ?Intent of Therapy: ?Non-Curative / Palliative Intent, Discussed with Patient ?

## 2022-12-14 NOTE — Telephone Encounter (Signed)
For recurrent rash of right neck

## 2022-12-15 ENCOUNTER — Other Ambulatory Visit: Payer: Self-pay | Admitting: Oncology

## 2022-12-15 ENCOUNTER — Other Ambulatory Visit: Payer: Self-pay

## 2022-12-15 DIAGNOSIS — C61 Malignant neoplasm of prostate: Secondary | ICD-10-CM

## 2022-12-15 DIAGNOSIS — C7951 Secondary malignant neoplasm of bone: Secondary | ICD-10-CM

## 2022-12-16 ENCOUNTER — Other Ambulatory Visit: Payer: Self-pay

## 2022-12-17 ENCOUNTER — Ambulatory Visit: Payer: Non-veteran care | Admitting: Oncology

## 2022-12-17 ENCOUNTER — Ambulatory Visit: Payer: Non-veteran care | Admitting: Hematology and Oncology

## 2022-12-17 ENCOUNTER — Other Ambulatory Visit: Payer: Non-veteran care

## 2022-12-18 ENCOUNTER — Encounter: Payer: Self-pay | Admitting: Oncology

## 2022-12-18 ENCOUNTER — Other Ambulatory Visit: Payer: Self-pay | Admitting: Pharmacist

## 2022-12-20 ENCOUNTER — Other Ambulatory Visit: Payer: Self-pay

## 2022-12-20 ENCOUNTER — Encounter: Payer: Self-pay | Admitting: Oncology

## 2022-12-20 MED FILL — Cabazitaxel Inj 60 MG/1.5ML (For IV Infusion): INTRAVENOUS | Qty: 4.1 | Status: AC

## 2022-12-20 MED FILL — Dexamethasone Sodium Phosphate Inj 100 MG/10ML: INTRAMUSCULAR | Qty: 1 | Status: AC

## 2022-12-20 NOTE — Progress Notes (Signed)
..  Pharmacist Chemotherapy Monitoring - Initial Assessment    Anticipated start date: 12/21/2022  The following has been reviewed per standard work regarding the patient's treatment regimen: The patient's diagnosis, treatment plan and drug doses, and organ/hematologic function Lab orders and baseline tests specific to treatment regimen  The treatment plan start date, drug sequencing, and pre-medications Prior authorization status  Patient's documented medication list, including drug-drug interaction screen and prescriptions for anti-emetics and supportive care specific to the treatment regimen The drug concentrations, fluid compatibility, administration routes, and timing of the medications to be used The patient's access for treatment and lifetime cumulative dose history, if applicable  The patient's medication allergies and previous infusion related reactions, if applicable   Changes made to treatment plan:  N/A  Follow up needed:  N/A   Domenic Schwab, [facility], 12/20/2022  5:48 PM

## 2022-12-21 ENCOUNTER — Inpatient Hospital Stay: Payer: No Typology Code available for payment source

## 2022-12-21 VITALS — BP 147/80 | HR 52 | Temp 97.4°F | Resp 18 | Ht 67.0 in | Wt 191.2 lb

## 2022-12-21 DIAGNOSIS — C7951 Secondary malignant neoplasm of bone: Secondary | ICD-10-CM

## 2022-12-21 DIAGNOSIS — C61 Malignant neoplasm of prostate: Secondary | ICD-10-CM

## 2022-12-21 DIAGNOSIS — Z5111 Encounter for antineoplastic chemotherapy: Secondary | ICD-10-CM | POA: Diagnosis not present

## 2022-12-21 MED ORDER — SODIUM CHLORIDE 0.9% FLUSH
10.0000 mL | INTRAVENOUS | Status: DC | PRN
  Administered 2022-12-21: 10 mL

## 2022-12-21 MED ORDER — FAMOTIDINE IN NACL 20-0.9 MG/50ML-% IV SOLN
20.0000 mg | Freq: Once | INTRAVENOUS | Status: AC
  Administered 2022-12-21: 20 mg via INTRAVENOUS
  Filled 2022-12-21: qty 50

## 2022-12-21 MED ORDER — HEPARIN SOD (PORK) LOCK FLUSH 100 UNIT/ML IV SOLN
500.0000 [IU] | Freq: Once | INTRAVENOUS | Status: AC | PRN
  Administered 2022-12-21: 500 [IU]

## 2022-12-21 MED ORDER — SODIUM CHLORIDE 0.9 % IV SOLN
10.0000 mg | Freq: Once | INTRAVENOUS | Status: AC
  Administered 2022-12-21: 10 mg via INTRAVENOUS
  Filled 2022-12-21: qty 10

## 2022-12-21 MED ORDER — SODIUM CHLORIDE 0.9 % IV SOLN
20.0000 mg/m2 | Freq: Once | INTRAVENOUS | Status: AC
  Administered 2022-12-21: 41 mg via INTRAVENOUS
  Filled 2022-12-21: qty 4.1

## 2022-12-21 MED ORDER — SODIUM CHLORIDE 0.9 % IV SOLN: Freq: Once | INTRAVENOUS | Status: AC

## 2022-12-21 MED ORDER — DIPHENHYDRAMINE HCL 50 MG/ML IJ SOLN
25.0000 mg | Freq: Once | INTRAMUSCULAR | Status: AC
  Administered 2022-12-21: 25 mg via INTRAVENOUS
  Filled 2022-12-21: qty 1

## 2022-12-21 NOTE — Patient Instructions (Signed)
[facility]  Discharge Instructions: Thank you for choosing [facility] to provide your oncology and hematology care.  If you have a lab appointment with the Cancer Center, please go directly to the Cancer Center and check in at the registration area.   Wear comfortable clothing and clothing appropriate for easy access to any Portacath or PICC line.   We strive to give you quality time with your provider. You may need to reschedule your appointment if you arrive late (15 or more minutes).  Arriving late affects you and other patients whose appointments are after yours.  Also, if you miss three or more appointments without notifying the office, you may be dismissed from the clinic at the provider's discretion.      For prescription refill requests, have your pharmacy contact our office and allow 72 hours for refills to be completed.    Today you received the following chemotherapy and/or immunotherapy agents Jevtana       To help prevent nausea and vomiting after your treatment, we encourage you to take your nausea medication as directed.  BELOW ARE SYMPTOMS THAT SHOULD BE REPORTED IMMEDIATELY: *FEVER GREATER THAN 100.4 F (38 C) OR HIGHER *CHILLS OR SWEATING *NAUSEA AND VOMITING THAT IS NOT CONTROLLED WITH YOUR NAUSEA MEDICATION *UNUSUAL SHORTNESS OF BREATH *UNUSUAL BRUISING OR BLEEDING *URINARY PROBLEMS (pain or burning when urinating, or frequent urination) *BOWEL PROBLEMS (unusual diarrhea, constipation, pain near the anus) TENDERNESS IN MOUTH AND THROAT WITH OR WITHOUT PRESENCE OF ULCERS (sore throat, sores in mouth, or a toothache) UNUSUAL RASH, SWELLING OR PAIN  UNUSUAL VAGINAL DISCHARGE OR ITCHING   Items with * indicate a potential emergency and should be followed up as soon as possible or go to the Emergency Department if any problems should occur.  Please show the CHEMOTHERAPY ALERT CARD or IMMUNOTHERAPY ALERT CARD at check-in to the  Emergency Department and triage nurse.  Should you have questions after your visit or need to cancel or reschedule your appointment, please contact [facility]  Dept: [phone]  and follow the prompts.  Office hours are 8:00 a.m. to 4:30 p.m. Monday - Friday. Please note that voicemails left after 4:00 p.m. may not be returned until the following business day.  We are closed weekends and major holidays. You have access to a nurse at all times for urgent questions. Please call the main number to the clinic Dept: [phone] and follow the prompts.  For any non-urgent questions, you may also contact your provider using MyChart. We now offer e-Visits for anyone 63 and older to request care online for non-urgent symptoms. For details visit [email or web address].   Also download the MyChart app! Go to the app store, search "MyChart", open the app, select [facility], and log in with your MyChart username and password.

## 2022-12-22 ENCOUNTER — Inpatient Hospital Stay: Payer: No Typology Code available for payment source

## 2022-12-22 ENCOUNTER — Telehealth: Payer: Self-pay

## 2022-12-22 VITALS — BP 123/56 | HR 60 | Temp 98.3°F | Resp 18 | Ht 67.0 in | Wt 195.2 lb

## 2022-12-22 DIAGNOSIS — C61 Malignant neoplasm of prostate: Secondary | ICD-10-CM

## 2022-12-22 DIAGNOSIS — C7951 Secondary malignant neoplasm of bone: Secondary | ICD-10-CM

## 2022-12-22 DIAGNOSIS — Z5111 Encounter for antineoplastic chemotherapy: Secondary | ICD-10-CM | POA: Diagnosis not present

## 2022-12-22 MED ORDER — PEGFILGRASTIM-CBQV 6 MG/0.6ML ~~LOC~~ SOSY
6.0000 mg | PREFILLED_SYRINGE | Freq: Once | SUBCUTANEOUS | Status: AC
  Administered 2022-12-22: 6 mg via SUBCUTANEOUS
  Filled 2022-12-22: qty 0.6

## 2022-12-22 NOTE — Patient Instructions (Signed)

## 2022-12-22 NOTE — Telephone Encounter (Signed)
I spoke with pt. He states, "everything is going good. I went and got my shot today about 1 o'clock". Pt denies N/V, SOB, diarrhea, fever, and neuropathy. Pt takes Miralax regularly due to pain medication. He started the Claritin to help with any bone pain he may have with the pegfilgrastim injection from today. Pt reminded to call us if he were to develop temp of 100.4 or higher, day or night.

## 2022-12-23 ENCOUNTER — Encounter: Payer: Self-pay | Admitting: Oncology

## 2022-12-23 NOTE — Progress Notes (Signed)
[facility]  [address] 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 He has been on Enzalutamide 160 mg daily, as well as leuprolide 22.5 mg, and zoledronic acid every 3 months.  His pain is controlled with MS Contin 15 mg twice daily with occasional Tylenol for breakthrough.   He was on Enzalutamide since June 2018.  He then had a steadily rising PSA, up to 83.49 in November 2022.  PSMA PET in October 2022 revealed an oligometastasis skeletal lesion it in the anterior right 7th rib, with increased activity, and very subtle sclerotic change at T10, but no evidence of progressive disease.The PSA has continued to fluctuate up and down.  It was down to 31 in February of 2023, then back up to 78 in May.  It then increased dramatically to 334 in August and he was scheduled for a PSMA PET scan.  This revealed severe progression of disease with several tiny lung nodules which did have hypermetabolic activity, with SUV up to 5.  He had increased bone lesions which are now innumerable, including major lesions at L3, the right iliac wing, and the left pubic bone. These were not visible on CT scan. He stopped the Enzalutamide and started the Taxotere chemotherapy in September 2023 and was tolerating this well. His PSA went down from 536 to 400.75 after 3 cycles and went down to 284 with the 5th cycle, and then 203 in March, 2024. The most recent one went up to 261 in May and I stopped his chemotherapy to give him a break.  We continued Lupron and Zometa every 3 months and he had this 11/26/22.  He now has rapid progression based on his PSA and PET scan.  The PSA jumped from 1156 last month to 1608 today.  Secondary malignant neoplasm of bone and bone marrow  (HCC) He continues zoledronic acid every 3 months in addition to his cancer therapy. He had marked progression of his bone metastases in August of 2023, but minimal symptoms, and was placed on Taxotere chemotherapy.  That was stopped in May 2024 and he has had rapid progression of his PSA and bone metastases.  We will change his chemotherapy to [facility] and start that next week.  He will continue the Lupron and Zometa injections every 3 months.   Anemia. He had an upper GI hemorrhage earlier this year and was referred to Dr. [name]. Fortunately he just had the one episode and was transfused. His hemoglobin came up to 11.1 and his hemoglobin remained stable at 11.3. He had a EGD scheduled on 07/09/2022 but it was canceled. His cardiologist Dr. Allyson Sabal is not comfortable with him stopping Plavix for another year. His bleeding has stopped and so we will not pursue an EGD at this time unless he begins having melena again and/or a significant drop in his hemoglobin. It is stable at 12.5 today.  COVID infection He had a severe infection last month and required hospitalization for respiratory distress.  He is recovering fairly well now.  Hypokalemia This is mild at 3.4 but will need to be monitored.  I will encourage him to increase potassium in his diet.   Plan:  He is recovering from his COVID and clearly has progression of his metastatic prostate cancer.  I postponed his injections until September 13.  He will continue to take Lupron and Zometa injections every 3 months..  He has now had a PET scan which reveals multiple new lesions of the spine compared to 1 year ago, including T12-L1 and L2, sacrum, multiple rib lesions, multiple scapular lesions and pelvic lesions.  Other lesions are increased in size.  He has very widespread skeletal metastases now but no evidence of nodal or visceral metastasis.  I have therefore discussed options of treatment.  1 option would be to resume Taxotere since he  tolerated it well, but his PSA was starting to rise.  Fortunately he does not have neuropathy from this and so I think a better choice would be Jevtana in case he has Taxotere resistant cancer cells.  His PSA has jumped up even further to 1608.  Fortunately the rest of his labs look good other than a mildly low potassium of 3.4.  He agrees we need to proceed soon and so we will start on October 8 with IV Jevtana every [redacted] weeks along with prednisone 5 mg twice daily.  I have reviewed the schedule and potential toxicities with him and we will plan to see him 10 days after his first dose with CBC and CMP for a nadir blood count.  He will then return in 3 weeks with CBC, CMP and PSA and we will plan his second cycle on October 29.  He is having some pain now of his back and shoulders. The patient understands the plans discussed today and is in agreement with them.  He knows to contact our office if he develops concerns prior to his next appointment.  His questions were answered.   I provided 28 minutes of face-to-face time during this encounter and > 50% was spent counseling as documented under my assessment and plan.   [name], MD  [facility] AT [facility] [address] [place] [address] Dept: [phone] Dept Fax: [phone]   No orders of the defined types were placed in this encounter.   I have reviewed this report as typed by the medical scribe, and it is complete and accurate.  [name]   12/23/22  5:22 PM     CHIEF COMPLAINT:  CC: Metastatic prostate cancer to bone, progressive  Current Treatment: Leuprolide/zoledronic acid and docetaxel  HISTORY OF PRESENT ILLNESS:  Marvin Johnson is a 72 year old male with a history of stage IV (T3b N1 M0) prostate cancer diagnosed in January 2016 when he was found to have a PSA of 51.8.  He was treated with a laparoscopic prostatectomy in May 2016.  Pathology  revealed adenocarcinoma with a Gleason 8.  There was a positive posterior margin with extraprostatic extension, as well as left retro-urethral tissue positive and extension to the seminal vesicles bilaterally with multiple positive margins.  Two lymph nodes from the right side were negative, but 2/4 lymph nodes from the left side were positive for metastasis. Repeat bone scan in June 2016 revealed a new lesion at T10, which was not present on his baseline bone scan.  PSA at Dr. Cindee Salt office in early August 2016 remained elevated at 56.4.   We began seeing him in August 2016. The PSA was up to 216.3 in late August 2016, so hormonal therapy was recommended  X-rays of the thoracic spine revealed osteoarthritis and scoliosis, as well as evidence of demineralization, but we could not visualize a metastatic lesion.[name]  He started leuprolide in September 2016.  The PSA initially dropped steadily with leuprolide.  Bone density scan in November 2016 revealed osteopenia, with a T-score of -2.1 in the spine and a T-score of -0.9 in the femur.  He had been taking vitamin-D 1000 international units daily, but not calcium, so calcium 600 mg twice daily was added at that time.    Leuprolide was held in February 2017 because of chest pain.  EKG was unremarkable.  He went to the [place] and he was referred to the [facility] for placement of 2 coronary artery stents for 95% and 99% occlusions.  He then had a third coronary artery stent placed later in the summer.  Leuprolide was resumed in March 2017.  In September 2017, he had an increase in the PSA, so we repeated a bone scan.  This revealed intense uptake within the right posterior elements of T10, but no other areas of metastasis.  We then added bicalutamide 50 mg daily to his leuprolide.     He had a steadily increasing PSA despite the bicalutamide, so this was discontinued in March 2018.  He eventually was placed on MS Contin 15 mg twice daily, with oxycodone 10 mg every 4 hours  as needed for breakthrough pain.  MRI thoracic spine in April 2018 revealed increasing tumor at T10 with epidural spread.  Bone scan at that time revealed increased activity in the T10 lesion, which was stable.  There was a questionable tiny focus in the right ischium/acetabular rim.  Plain x-rays of the bilateral hips and pelvis did not reveal any focal abnormality. The PSA increased from 27.5 in March, to 72.2 in May, then to 92.2 in June 2018.  He was referred for radiotherapy to the T10 lesion due to the severe pain with improvement in his pain. He was started on zoledronic acid along with his leuprolide in June 2018.  He had a Port-A-Cath placed in anticipation of chemotherapy, but then we recommended that he be placed on enzalutamide 160 mg daily instead, in order to delay the time to initiation of chemotherapy.  He started enzalutamide in June 2018.  The PSA became undetectable in October 2018.  We continued MS Contin, but discontinued the oxycodone. He did not tolerate meloxicam, so uses Tylenol as needed for breakthrough pain.     He had a stress test in January 2020 and was referred to [facility] for coronary artery stenting, which was done in February.  Zoledronic acid had been given monthly for over 2 years, so was changed to every 3 months in October 2020.   The PSA started to increase in January 2021 at which time it was 0.2.  CT chest, abdomen and pelvis in March 2021 not reveal any lymphadenopathy or soft tissue metastatic disease.  Hepatic steatosis was seen.  There was a stable sclerotic osseous lesion of the T10 vertebral body, as well as a subtle sclerotic lesion of the L3 vertebral body, also possibly a small metastatic lesion.  Whole body bone scan did not reveal continued increased activity of T10 and right acetabulum posteriorly.  The PSA was 0.5 in March.  PSA had increased to 3.0 in June.  Axumin PET imaging from July did not reveal any evidence of active prostate  carcinoma, local recurrence, or metastatic disease. The PSA continued to slowly rise and was up to 5.9 in September, 10.9 in October, and 16.3 in December.     The PSA went up to 23.81  in March 2022, so an Axumin PET scan was ordered which revealed an oligometastasis skeletal lesion in the anterior right 7th rib with very subtle sclerotic change at this level. PSA in June decreased to 18.6, but then in September increased to 58.  In view of the significant rise in his PSA, a prostate specific PET scan was ordered.  PSMA PET in October 2022 revealed a persistent focus of intense radiotracer activity in the anterior RIGHT seventh rib with increased in activity from comparison exam and subtle sclerotic changes on CT. Findings remain consistent with oligometastatic skeletal metastasis. Sclerotic lesions in the T10 vertebral body without radiotracer activity potentially represented prior treated prostate cancer metastasis.  We recommended he continue his current treatment with enzalutamide, leuprolide and zoledronic acid.  PSA in November continued to increase to 83.49.  Even though his PSA continued to steadily increase, we did not recommend a change in therapy based on this alone.  He had no severe pain and no increase in pain and was having a good quality of life.    The PSA has continued to fluctuate up and down and was 31 in February and 78 in May of 2023. t then increased dramatically to 334 in August and he was scheduled for a PSMA PET scan.  This revealed severe progression of disease with several tiny lung nodules which did have hypermetabolic activity, with SUV up to 5.  He had increased bone lesions which are now innumerable, including major lesions at L3, the right iliac wing, and the left pubic bone. These were not visible on CT scan. He stopped the Enzalutamide and started the Taxotere chemotherapy in September 2023 and is tolerating this well. His PSA went down from 536 to 400.75 after 3 cycles and  went down to 284 with the 5th cycle. It was down to 203 by March of 2024. He then had a gastrointestinal hemorrhage with melena and drop of his hemoglobin from 12 to 7.0 on March 11th. He was hospitalized for transfusion but EGD was not done due to needing to stay on Plavix after his most recent MI.   Oncology History  Malignant neoplasm of prostate (HCC)  03/30/2014 Cancer Staging   Staging form: Prostate, AJCC 8th Edition - Clinical stage from 03/30/2014: Stage IVA (cT3b, cN1, cM0, PSA: 51.8, Grade Group: 4) - Signed by [name], MD on 11/14/2020 Histopathologic type: Adenocarcinoma, NOS Stage prefix: Initial diagnosis Prostate specific antigen (PSA) range: 20 or greater Gleason primary pattern: 4 Gleason secondary pattern: 4 Gleason score: 8 Histologic grading system: 5 grade system Location of positive needle core biopsies: Beyond primary site Prognostic indicators: May 2016 Robotic prostatectomy with mult. Pos margins, extra prostatic extension, 2/6 pos nodes. Scan June 2016 positive at T10  PSA up to 216.3 by August Placed on lupron and casodex Stage used in treatment planning: Yes National guidelines used in treatment planning: Yes Type of national guideline used in treatment planning: NCCN   02/18/2020 Initial Diagnosis   Malignant neoplasm of prostate (HCC)   12/01/2021 - 07/19/2022 Chemotherapy   Patient is on Treatment Plan : PROSTATE Docetaxel (75) + Prednisone q21d     12/21/2022 -  Chemotherapy   Patient is on Treatment Plan : PROSTATE Cabazitaxel (20) D1 + Prednisone D1-21 q21d     Secondary malignant neoplasm of bone and bone marrow (HCC)  02/18/2020 Initial Diagnosis   Secondary malignant neoplasm of bone and bone marrow (HCC)   12/01/2021 - 07/19/2022 Chemotherapy   Patient is  on Treatment Plan : PROSTATE Docetaxel (75) + Prednisone q21d     12/21/2022 -  Chemotherapy   Patient is on Treatment Plan : PROSTATE Cabazitaxel (20) D1 + Prednisone D1-21 q21d      Malignant neoplasm of prostate metastatic to lung (HCC)  11/25/2021 Initial Diagnosis   Malignant neoplasm of prostate metastatic to lung [facility])   12/21/2022 -  Chemotherapy   Patient is on Treatment Plan : PROSTATE Cabazitaxel (20) D1 + Prednisone D1-21 q21d         INTERVAL HISTORY:  Josejulian is here today for repeat clinical assessment for progressive metastatic prostate cancer to bone.  He had COVID a few weeks ago and had severe dyspnea requiring admission to [facility] for 3 days with pneumonia of the right lung.  His PSA had increased from 261 in May to 1156 and so he is here to discuss further options of treatment.  He does feel that he has gotten over the COVID for the most part but still has some fatigue.  He does note pain of his back and shoulders now.  He requests a refill of triamcinolone for his rash of the neck.  I postponed his injections until September 13.  He will continue to take Lupron and Zometa injections every 3 months..  He has now had a PET scan which reveals multiple new lesions of the spine compared to 1 year ago, including T12-L1 and L2, sacrum, multiple rib lesions, multiple scapular lesions and pelvic lesions.  Other lesions are increased in size.  He has very widespread skeletal metastases now but no evidence of nodal or visceral metastasis.  I have therefore discussed options of treatment.  1 option would be to resume Taxotere since he tolerated it well, but his PSA was starting to rise.  Fortunately he does not have neuropathy from this and so I think a better choice would be Jevtana in case he has Taxotere resistant cancer cells.  His PSA has jumped up even further to 1608.  Fortunately the rest of his labs look good other than a mildly low potassium of 3.4.  He agrees we need to proceed soon and so we will start on October 8 with IV Jevtana every [redacted] weeks along with prednisone 5 mg twice daily.  I have reviewed the schedule and potential toxicities with him and we  will plan to see him 10 days after his first dose with CBC and CMP for a nadir blood count.  He will then return in 3 weeks with CBC, CMP and PSA and we will plan his second cycle on October 29.  I will send that prescription and and refill his triamcinolone.  Fortunately his hemoglobin was up to 12.5 with a normal white count and platelet count. He denies sore throat, sinus drainage, or urinary symptoms.. He denies vomiting, dyspnea. His appetite is great and her weight has been stable.   REVIEW OF SYSTEMS:  Review of Systems  Constitutional:  Negative for appetite change, chills, diaphoresis, fatigue, fever and unexpected weight change.  HENT:  Negative.  Negative for hearing loss, lump/mass, mouth sores, nosebleeds, sore throat, tinnitus, trouble swallowing and voice change.   Eyes: Negative.  Negative for eye problems and icterus.  Respiratory:  Negative for chest tightness, cough (with congestion), hemoptysis, shortness of breath and wheezing.   Cardiovascular:  Negative for chest pain (right upper quadrant when coughing), leg swelling and palpitations.  Gastrointestinal:  Negative for abdominal distention, abdominal pain, blood in stool, constipation,  diarrhea, nausea (dry heaves), rectal pain and vomiting.  Endocrine: Negative.  Negative for hot flashes.  Genitourinary: Negative.  Negative for bladder incontinence, difficulty urinating, dyspareunia, dysuria, frequency, hematuria, nocturia, pelvic pain and penile discharge.   Musculoskeletal:  Positive for arthralgias (chronic) and back pain. Negative for flank pain, gait problem, myalgias, neck pain and neck stiffness.  Skin: Negative.  Negative for itching, rash and wound.  Neurological:  Negative for dizziness, extremity weakness, gait problem, headaches, light-headedness, numbness, seizures and speech difficulty.  Hematological: Negative.  Negative for adenopathy. Does not bruise/bleed easily.  Psychiatric/Behavioral: Negative.  Negative  for confusion, decreased concentration, depression, sleep disturbance and suicidal ideas. The patient is not nervous/anxious.     VITALS:  Blood pressure 131/70, pulse 84, resp. rate 18, height 5\' 7"  (1.702 m), weight 194 lb (88 kg), SpO2 96%.  Wt Readings from Last 3 Encounters:  12/22/22 195 lb 4 oz (88.6 kg)  12/21/22 191 lb 4 oz (86.8 kg)  12/14/22 194 lb (88 kg)    Body mass index is 30.38 kg/m.  Performance status (ECOG): 1 - Symptomatic but completely ambulatory  PHYSICAL EXAM:  Physical Exam Vitals and nursing note reviewed. Exam conducted with a chaperone present.  Constitutional:      General: He is not in acute distress.    Appearance: Normal appearance. He is normal weight. He is not ill-appearing, toxic-appearing or diaphoretic.  HENT:     Head: Normocephalic and atraumatic.     Right Ear: Tympanic membrane, ear canal and external ear normal. There is no impacted cerumen.     Left Ear: Tympanic membrane, ear canal and external ear normal. There is no impacted cerumen.     Nose: No congestion or rhinorrhea.     Mouth/Throat:     Mouth: Mucous membranes are moist.     Pharynx: Oropharynx is clear. No oropharyngeal exudate or posterior oropharyngeal erythema.  Eyes:     General: No scleral icterus.       Right eye: No discharge.        Left eye: No discharge.     Extraocular Movements: Extraocular movements intact.     Conjunctiva/sclera: Conjunctivae normal.     Pupils: Pupils are equal, round, and reactive to light.  Neck:     Vascular: No carotid bruit.  Cardiovascular:     Rate and Rhythm: Normal rate and regular rhythm.     Pulses: Normal pulses.     Heart sounds: Normal heart sounds. No murmur heard.    No friction rub. No gallop.  Pulmonary:     Effort: Pulmonary effort is normal. No respiratory distress.     Breath sounds: Normal breath sounds. No stridor. No wheezing, rhonchi or rales.  Chest:     Chest wall: No tenderness.  Abdominal:     General:  Bowel sounds are normal. There is no distension.     Palpations: Abdomen is soft. There is no hepatomegaly, splenomegaly or mass.     Tenderness: There is no abdominal tenderness. There is no right CVA tenderness, left CVA tenderness, guarding or rebound.     Hernia: No hernia is present.     Comments: Right lower ribcage pain on a lateral aspect  Musculoskeletal:        General: No swelling, tenderness, deformity or signs of injury. Normal range of motion.     Cervical back: Normal range of motion and neck supple. No rigidity or tenderness.     Right lower leg: Edema (trace)  present.     Left lower leg: Edema (trace) present.  Lymphadenopathy:     Cervical: No cervical adenopathy.     Upper Body:     Right upper body: No supraclavicular or axillary adenopathy.     Left upper body: No supraclavicular or axillary adenopathy.     Lower Body: No right inguinal adenopathy. No left inguinal adenopathy.  Skin:    General: Skin is warm and dry.     Coloration: Skin is not jaundiced or pale.     Findings: No bruising, erythema, lesion or rash.  Neurological:     General: No focal deficit present.     Mental Status: He is alert and oriented to person, place, and time. Mental status is at baseline.     Cranial Nerves: No cranial nerve deficit.     Sensory: No sensory deficit.     Motor: No weakness.     Coordination: Coordination normal.     Gait: Gait normal.     Deep Tendon Reflexes: Reflexes normal.  Psychiatric:        Mood and Affect: Mood normal.        Behavior: Behavior normal.        Thought Content: Thought content normal.        Judgment: Judgment normal.   LABS:       Latest Ref Rng & Units 12/14/2022   11:58 AM 12/03/2022    9:12 AM 11/12/2022    8:25 AM  CBC  WBC 4.0 - 10.5 K/uL 11.5  10.9  13.6   Hemoglobin 13.0 - 17.0 g/dL 16.1  09.6  04.5   Hematocrit 39.0 - 52.0 % 40.6  40.8  43.2   Platelets 150 - 400 K/uL 262  236  218       Latest Ref Rng & Units 12/14/2022    11:58 AM 12/03/2022    9:12 AM 11/12/2022    8:25 AM  CMP  Glucose 70 - 99 mg/dL 409  811  914   BUN 8 - 23 mg/dL 16  23  14    Creatinine 0.61 - 1.24 mg/dL 7.82  9.56  2.13   Sodium 135 - 145 mmol/L 136  138  139   Potassium 3.5 - 5.1 mmol/L 3.4  3.6  3.6   Chloride 98 - 111 mmol/L 103  101  102   CO2 22 - 32 mmol/L 25  26  26    Calcium 8.9 - 10.3 mg/dL 9.1  8.9  9.1   Total Protein 6.5 - 8.1 g/dL 7.5  7.1  7.8   Total Bilirubin 0.3 - 1.2 mg/dL 0.3  0.8  0.7   Alkaline Phos 38 - 126 U/L 123  90  97   AST 15 - 41 U/L 30  25  27    ALT 0 - 44 U/L 20  27  21     Component Ref Range & Units 1 d ago (08/03/22) 1 mo ago (06/11/22) 2 mo ago (05/24/22) 3 mo ago (05/03/22) 4 mo ago (04/02/22) 4 mo ago (03/12/22) 5 mo ago (02/19/22)  Prostatic Specific Antigen 0.00 - 4.00 ng/mL 261.12 High  >150.00 High  VC, CM 203.44 High  CM 235.00 High  CM 406.00 High  CM 284.59 High  CM 403.39 High  CM     No results found for: "CEA1", "CEA" / No results found for: "CEA1", "CEA" Lab Results  Component Value Date   PSA1 334.0 (H) 10/30/2021    No results found  for: "AOZ308" No results found for: "CAN125"  No results found for: "TOTALPROTELP", "ALBUMINELP", "A1GS", "A2GS", "BETS", "BETA2SER", "GAMS", "MSPIKE", "SPEI" Lab Results  Component Value Date   TIBC 398 01/29/2022   FERRITIN 206 01/29/2022   IRONPCTSAT 25 01/29/2022   No results found for: "LDH"  STUDIES:  Exam: 08/04/2022 MRI Head without and With Contrast Impression: No evidence of acute intracranial abnormality.   Exam: 06/29/2022 EKG Impression: N/A  Exam: 04/17/2022 CT abdomen and Pelvis without Contrast Impression: Decreased sensitivity exam secondary to lack of contrast and minimal motion degradation. Fluid-filled colon, suggesting a diarrheal state. No other explanation for patient's acute symptoms. Prostatectomy. Sclerotic lesions including within the anterior seventh right rib and right iliac are suspicious for  progressive osseous metastasis. No findings of extraosseous metastatic disease within the abdomen or pelvis. New lingular nodules up to 7mm are indeterminate. Consider non-emergent, outpatient chest CT to evaluate for pulmonary metastasis. Small bowel non rotation. Hepatic Steatosis.    HISTORY:   Past Medical History:  Diagnosis Date   Hyperlipidemia    Malignant neoplasm of prostate (HCC)    Supraventricular tachycardia (HCC)     Past Surgical History:  Procedure Laterality Date   CORONARY STENT INTERVENTION N/A 02/08/2022   Procedure: CORONARY STENT INTERVENTION;  Surgeon: Yvonne Kendall, MD;  Location: [facility]VASIVE [facility];  Service: Cardiovascular;  Laterality: N/A;   LEFT HEART CATH AND CORONARY ANGIOGRAPHY N/A 02/08/2022   Procedure: LEFT HEART CATH AND CORONARY ANGIOGRAPHY;  Surgeon: Yvonne Kendall, MD;  Location: [facility];  Service: Cardiovascular;  Laterality: N/A;   PROSTATECTOMY  07/2014   TONSILLECTOMY      Family History  Problem Relation Age of Onset   Breast cancer Mother 48   Ovarian cancer Mother 25   Breast cancer Paternal Grandmother 51    Social History:  reports that he has never smoked. He has never used smokeless tobacco. He reports that he does not currently use alcohol. He reports that he does not use drugs.The patient is alone today.  Allergies:  Allergies  Allergen Reactions   Atorvastatin    Rosuvastatin Other (See Comments)    Other reaction(s): Muscle pain   Simvastatin Other (See Comments)    Other reaction(s): Joint pain, Muscle pain   Sulfa Antibiotics    Sulfamethoxazole-Trimethoprim Other (See Comments)    Unknown as to interaction was a baby.    Current Medications: Current Outpatient Medications  Medication Sig Dispense Refill   Alirocumab (PRALUENT [place]) Inject into the skin. One injection every 2 weeks Monday     APPLE CIDER VINEGAR PO Take by mouth.     Cholecalciferol 25 MCG (1000 UT) tablet Take 1,000 Units by  mouth in the morning and at bedtime.     clopidogrel (PLAVIX) 75 MG tablet Take 75 mg by mouth daily.     diphenhydrAMINE (BENADRYL) 25 mg capsule Take 1 capsule by mouth at bedtime.     diphenhydrAMINE (BENADRYL) 50 MG capsule Take 50 mg by mouth at bedtime as needed.     ezetimibe (ZETIA) 10 MG tablet Take 10 mg by mouth daily.     isosorbide mononitrate (IMDUR) 30 MG 24 hr tablet Take 1 tablet by mouth every 8 (eight) hours as needed.     leuprolide (LUPRON) 22.5 MG injection Inject 22.5 mg into the muscle every 3 (three) months.     metoprolol tartrate (LOPRESSOR) 25 MG tablet Take 12.5 mg by mouth 2 (two) times daily.     morphine (  MS CONTIN) 15 MG 12 hr tablet Take 1 tablet (15 mg total) by mouth every 12 (twelve) hours. 60 tablet 0   Multiple Vitamins-Minerals (MULTIVITAMIN WITH MINERALS) tablet Take 1 tablet by mouth daily.     nitroGLYCERIN (NITROSTAT) 0.4 MG SL tablet Place 0.4 mg under the tongue every 5 (five) minutes as needed for chest pain. Repeat every 5 minutes if needed for a total of 3 tablets in 15 minutes. If no relief, CALL 911.     Omega-3 Fatty Acids (FISH OIL) 1000 MG CAPS Take 2,000 mg by mouth in the morning and at bedtime.     ondansetron (ZOFRAN) 8 MG tablet Take 8 mg by mouth every 8 (eight) hours as needed for nausea or vomiting.     polyethylene glycol powder (GLYCOLAX/MIRALAX) 17 GM/SCOOP powder Take 1 Container by mouth once.     predniSONE (DELTASONE) 5 MG tablet Take 1 tablet (5 mg total) by mouth 2 (two) times daily with a meal. 60 tablet 5   prochlorperazine (COMPAZINE) 10 MG tablet Take 10 mg by mouth every 6 (six) hours as needed for nausea or vomiting.     Royal Jelly-Bee Pollen-Ginseng (KOREAN GINSENG COMPLEX) 150-250-50 MG CAPS Take 1 capsule by mouth daily.     triamcinolone (KENALOG) 0.025 % cream Apply 1 Application topically 2 (two) times daily. 30 g 0   Zoledronic Acid (ZOMETA) 4 MG/100ML IVPB Inject 4 mg into the vein every 3 (three) months.     No  current facility-administered medications for this visit.     I,Jasmine M Lassiter,acting as a scribe for [name], MD.,have documented all relevant documentation on the behalf of [name], MD,as directed by  [name], MD while in the presence of [name], MD.

## 2022-12-24 ENCOUNTER — Telehealth: Payer: Self-pay

## 2022-12-24 ENCOUNTER — Ambulatory Visit: Payer: No Typology Code available for payment source

## 2022-12-24 NOTE — Telephone Encounter (Signed)
-----   Message from [name] sent at 12/23/2022  7:04 PM EDT ----- Regarding: call Tell him to increase K in his diet, was a little low

## 2022-12-24 NOTE — Telephone Encounter (Signed)
Patient notified

## 2022-12-27 ENCOUNTER — Other Ambulatory Visit: Payer: Self-pay

## 2022-12-27 DIAGNOSIS — C7951 Secondary malignant neoplasm of bone: Secondary | ICD-10-CM

## 2022-12-27 DIAGNOSIS — M898X9 Other specified disorders of bone, unspecified site: Secondary | ICD-10-CM

## 2022-12-27 DIAGNOSIS — C61 Malignant neoplasm of prostate: Secondary | ICD-10-CM

## 2022-12-27 MED ORDER — MORPHINE SULFATE ER 15 MG PO TBCR: 15.0000 mg | EXTENDED_RELEASE_TABLET | Freq: Two times a day (BID) | ORAL | 0 refills | Status: DC

## 2022-12-28 ENCOUNTER — Other Ambulatory Visit: Payer: Self-pay | Admitting: Oncology

## 2022-12-28 MED ORDER — MORPHINE SULFATE ER 15 MG PO TBCR
15.0000 mg | EXTENDED_RELEASE_TABLET | Freq: Two times a day (BID) | ORAL | 0 refills | Status: DC
Start: 2022-12-28 — End: 2023-01-24

## 2022-12-30 ENCOUNTER — Encounter: Payer: Self-pay | Admitting: Oncology

## 2022-12-30 NOTE — Progress Notes (Signed)
[facility]  [address] 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 He has been on Enzalutamide 160 mg daily, as well as leuprolide 22.5 mg, and zoledronic acid every 3 months.  His pain is controlled with MS Contin 15 mg twice daily with occasional Tylenol for breakthrough.   He was on Enzalutamide since June 2018.  He then had a steadily rising PSA, up to 83.49 in November 2022.  PSMA PET in October 2022 revealed an oligometastasis skeletal lesion it in the anterior right 7th rib, with increased activity, and very subtle sclerotic change at T10, but no evidence of progressive disease.The PSA has continued to fluctuate up and down.  It was down to 31 in February of 2023, then back up to 78 in May.  It then increased dramatically to 334 in August and he was scheduled for a PSMA PET scan.  This revealed severe progression of disease with several tiny lung nodules which did have hypermetabolic activity, with SUV up to 5.  He had increased bone lesions which are now innumerable, including major lesions at L3, the right iliac wing, and the left pubic bone. These were not visible on CT scan. He stopped the Enzalutamide and started the Taxotere chemotherapy in September 2023 and was tolerating this well. His PSA went down from 536 to 400.75 after 3 cycles and went down to 284 with the 5th cycle, and then 203 in March, 2024. The most recent one went up to 261 in May and I stopped his chemotherapy to give him a break.  We continued Lupron and Zometa every 3 months and he had this 11/26/22.  He now has rapid progression based on his PSA and PET scan.  The PSA jumped from 1156 last month to 1608 prior to initiating Jevtana.  I believe he is tolerating treatment as  expected and reassured him.  Leukocytosis I explained this is due to his GCSF injection and is to be expected. It will decrease over the next 10 days.   Secondary malignant neoplasm of bone and bone marrow (HCC) He continues zoledronic acid every 3 months in addition to his cancer therapy. He had marked progression of his bone metastases in August of 2023, but minimal symptoms, and was placed on Taxotere chemotherapy.  That was stopped in May 2024 and he has had rapid progression of his PSA and bone metastases.  We will change his chemotherapy to [facility] and start that next week.  He will continue the Lupron and Zometa injections every 3 months. PET scan reveals multiple new lesions of the spine compared to 1 year ago, including T12-L1 and L2, sacrum, multiple rib lesions, multiple scapular lesions and pelvic lesions.  Other lesions are increased in size.  He has very widespread skeletal metastases now but no evidence of nodal or visceral metastasis.  Hypercalcemia This is mildly elevated at 10.3 but he is not taking any supplement. If this persists, we will administer his next Zometa sooner than 3 months.    Anemia. He had an upper GI hemorrhage earlier this year and was referred to Dr. [name]. Fortunately he just had the one episode and was transfused. His hemoglobin came up to 11.1 and his hemoglobin remained stable at 11.3. He had a EGD scheduled on  07/09/2022 but it was canceled. His cardiologist Dr. Allyson Sabal is not comfortable with him stopping Plavix for another year. His bleeding has stopped and so we will not pursue an EGD at this time unless he begins having melena again and/or a significant drop in his hemoglobin. It is stable at 12.5 today.   COVID infection He had a severe infection last month and required hospitalization for respiratory distress.  He is recovering fairly well now.   Plan:  His day 1 cycle 2 of Jevtana is scheduled on 01/11/2023. He takes tylenol for some pain  relief and is not taking oxycodone. I informed him about tramadol as a alternative if he would like to try that one day. He declines at this time but will let me know in the future. His WBC is elevated at 33.4, hemoglobin is low at 12.4, and platelet count is 273,000 as of today. His CMP is normal besides a elevated calcium of 10.3. He used to receive Lupron and Zometa injections monthly. I explained that the Zometa injections will help lower his calcium level so he might need it sooner than 3 months. We will monitor the calcium level and he will stay off any supplement. I believe he is tolerating treatment as expected. During physical exam I noticed several areas of rash on his skin with a large plaque like lesion in the left upper chest with erythematous edges which are irregular. He has similar areas of bilateral neck and this appears yeast like. He says there has been some improvement with the Triamcinolone but I will add a anti fungal medication. He informed me that these rashes come and go and that he also has some on his back. I believe this to be caused by yeast and I will prescribe Diflucan 100mg  for 10 days. He will meet with [facility] in 1 week with labs prior to his second cycle if he is stable we will go to 3 week follow-up. The patient understands the plans discussed today and is in agreement with them.  He knows to contact our office if he develops concerns prior to his next appointment.  His questions were answered.   I provided 14 minutes of face-to-face time during this encounter and > 50% was spent counseling as documented under my assessment and plan.   [name], MD  [facility] [address] [place] [address] Dept: [phone] Dept Fax: [phone]   No orders of the defined types were placed in this encounter.  I have reviewed this report as typed by the medical scribe, and it is complete and  accurate.  [name]   01/17/23  5:29 PM    CHIEF COMPLAINT:  CC: Metastatic prostate cancer to bone, progressive  Current Treatment: Leuprolide/zoledronic acid and docetaxel  HISTORY OF PRESENT ILLNESS:  Marvin Johnson is a 72 year old male with a history of stage IV (T3b N1 M0) prostate cancer diagnosed in January 2016 when he was found to have a PSA of 51.8.  He was treated with a laparoscopic prostatectomy in May 2016.  Pathology revealed adenocarcinoma with a Gleason 8.  There was a positive posterior margin with extraprostatic extension, as well as left retro-urethral tissue positive and extension to the seminal vesicles bilaterally with multiple positive margins.  Two lymph nodes from the right side were negative, but 2/4 lymph nodes from the left side were positive for metastasis. Repeat bone scan in June 2016 revealed a  new lesion at T10, which was not present on his baseline bone scan.  PSA at Dr. Cindee Salt office in early August 2016 remained elevated at 56.4.   We began seeing him in August 2016. The PSA was up to 216.3 in late August 2016, so hormonal therapy was recommended  X-rays of the thoracic spine revealed osteoarthritis and scoliosis, as well as evidence of demineralization, but we could not visualize a metastatic lesion.[name] He started leuprolide in September 2016.  The PSA initially dropped steadily with leuprolide.  Bone density scan in November 2016 revealed osteopenia, with a T-score of -2.1 in the spine and a T-score of -0.9 in the femur.  He had been taking vitamin-D 1000 international units daily, but not calcium, so calcium 600 mg twice daily was added at that time.    Leuprolide was held in February 2017 because of chest pain.  EKG was unremarkable.  He went to the [place] and he was referred to the [facility] for placement of 2 coronary artery stents for 95% and 99% occlusions.  He then had a third coronary artery stent placed later in the summer.  Leuprolide was  resumed in March 2017.  In September 2017, he had an increase in the PSA, so we repeated a bone scan.  This revealed intense uptake within the right posterior elements of T10, but no other areas of metastasis.  We then added bicalutamide 50 mg daily to his leuprolide.     He had a steadily increasing PSA despite the bicalutamide, so this was discontinued in March 2018.  He eventually was placed on MS Contin 15 mg twice daily, with oxycodone 10 mg every 4 hours as needed for breakthrough pain.  MRI thoracic spine in April 2018 revealed increasing tumor at T10 with epidural spread.  Bone scan at that time revealed increased activity in the T10 lesion, which was stable.  There was a questionable tiny focus in the right ischium/acetabular rim.  Plain x-rays of the bilateral hips and pelvis did not reveal any focal abnormality. The PSA increased from 27.5 in March, to 72.2 in May, then to 92.2 in June 2018.  He was referred for radiotherapy to the T10 lesion due to the severe pain with improvement in his pain. He was started on zoledronic acid along with his leuprolide in June 2018.  He had a Port-A-Cath placed in anticipation of chemotherapy, but then we recommended that he be placed on enzalutamide 160 mg daily instead, in order to delay the time to initiation of chemotherapy.  He started enzalutamide in June 2018.  The PSA became undetectable in October 2018.  We continued MS Contin, but discontinued the oxycodone. He did not tolerate meloxicam, so uses Tylenol as needed for breakthrough pain.     He had a stress test in January 2020 and was referred to [facility] for coronary artery stenting, which was done in February.  Zoledronic acid had been given monthly for over 2 years, so was changed to every 3 months in October 2020.   The PSA started to increase in January 2021 at which time it was 0.2.  CT chest, abdomen and pelvis in March 2021 not reveal any lymphadenopathy or soft tissue metastatic  disease.  Hepatic steatosis was seen.  There was a stable sclerotic osseous lesion of the T10 vertebral body, as well as a subtle sclerotic lesion of the L3 vertebral body, also possibly a small metastatic lesion.  Whole body bone scan did not  reveal continued increased activity of T10 and right acetabulum posteriorly.  The PSA was 0.5 in March.  PSA had increased to 3.0 in June.  Axumin PET imaging from July did not reveal any evidence of active prostate carcinoma, local recurrence, or metastatic disease. The PSA continued to slowly rise and was up to 5.9 in September, 10.9 in October, and 16.3 in December.     The PSA went up to 23.81 in March 2022, so an Axumin PET scan was ordered which revealed an oligometastasis skeletal lesion in the anterior right 7th rib with very subtle sclerotic change at this level. PSA in June decreased to 18.6, but then in September increased to 58.  In view of the significant rise in his PSA, a prostate specific PET scan was ordered.  PSMA PET in October 2022 revealed a persistent focus of intense radiotracer activity in the anterior RIGHT seventh rib with increased in activity from comparison exam and subtle sclerotic changes on CT. Findings remain consistent with oligometastatic skeletal metastasis. Sclerotic lesions in the T10 vertebral body without radiotracer activity potentially represented prior treated prostate cancer metastasis.  We recommended he continue his current treatment with enzalutamide, leuprolide and zoledronic acid.  PSA in November continued to increase to 83.49.  Even though his PSA continued to steadily increase, we did not recommend a change in therapy based on this alone.  He had no severe pain and no increase in pain and was having a good quality of life.    The PSA has continued to fluctuate up and down and was 31 in February and 78 in May of 2023. t then increased dramatically to 334 in August and he was scheduled for a PSMA PET scan.  This revealed  severe progression of disease with several tiny lung nodules which did have hypermetabolic activity, with SUV up to 5.  He had increased bone lesions which are now innumerable, including major lesions at L3, the right iliac wing, and the left pubic bone. These were not visible on CT scan. He stopped the Enzalutamide and started the Taxotere chemotherapy in September 2023 and is tolerating this well. His PSA went down from 536 to 400.75 after 3 cycles and went down to 284 with the 5th cycle. It was down to 203 by March of 2024. He then had a gastrointestinal hemorrhage with melena and drop of his hemoglobin from 12 to 7.0 on March 11th. He was hospitalized for transfusion but EGD was not done due to needing to stay on Plavix after his most recent MI.   Oncology History  Malignant neoplasm of prostate (HCC)  03/30/2014 Cancer Staging   Staging form: Prostate, AJCC 8th Edition - Clinical stage from 03/30/2014: Stage IVA (cT3b, cN1, cM0, PSA: 51.8, Grade Group: 4) - Signed by [name], MD on 11/14/2020 Histopathologic type: Adenocarcinoma, NOS Stage prefix: Initial diagnosis Prostate specific antigen (PSA) range: 20 or greater Gleason primary pattern: 4 Gleason secondary pattern: 4 Gleason score: 8 Histologic grading system: 5 grade system Location of positive needle core biopsies: Beyond primary site Prognostic indicators: May 2016 Robotic prostatectomy with mult. Pos margins, extra prostatic extension, 2/6 pos nodes. Scan June 2016 positive at T10  PSA up to 216.3 by August Placed on lupron and casodex Stage used in treatment planning: Yes National guidelines used in treatment planning: Yes Type of national guideline used in treatment planning: NCCN   02/18/2020 Initial Diagnosis   Malignant neoplasm of prostate (HCC)   12/01/2021 - 07/19/2022 Chemotherapy  Patient is on Treatment Plan : PROSTATE Docetaxel (75) + Prednisone q21d     12/21/2022 -  Chemotherapy   Patient is on  Treatment Plan : PROSTATE Cabazitaxel (20) D1 + Prednisone D1-21 q21d     Secondary malignant neoplasm of bone and bone marrow (HCC)  02/18/2020 Initial Diagnosis   Secondary malignant neoplasm of bone and bone marrow (HCC)   12/01/2021 - 07/19/2022 Chemotherapy   Patient is on Treatment Plan : PROSTATE Docetaxel (75) + Prednisone q21d     12/21/2022 -  Chemotherapy   Patient is on Treatment Plan : PROSTATE Cabazitaxel (20) D1 + Prednisone D1-21 q21d     Malignant neoplasm of prostate metastatic to lung (HCC)  11/25/2021 Initial Diagnosis   Malignant neoplasm of prostate metastatic to lung (HCC)   12/21/2022 -  Chemotherapy   Patient is on Treatment Plan : PROSTATE Cabazitaxel (20) D1 + Prednisone D1-21 q21d         INTERVAL HISTORY:  Marvin Johnson is here today for repeat clinical assessment for progressive metastatic prostate cancer to bone. His PSA had increased to 1608 and PET scan reveals multiple new lesions of the spine compared to 1 year ago, including T12-L1 and L2, sacrum, multiple rib lesions, multiple scapular lesions and pelvic lesions.  Other lesions are increased in size.  He has very widespread skeletal metastases now but no evidence of nodal or visceral metastasis. Patient states that he feels ok but complains of severe fatigue, nausea, and diarrhea for the last 4 days and took 1 imodium which has made him constipated for the last 2 days. He had his first treatment of Jevtana, and 5 days later he was very weak and fatigued, had aches and nausea which was resolved by Compazine or Zofran. Theses symptoms besides fatigue have resolved. His day 1 cycle 2 of Jevtana is scheduled on 01/11/2023. He takes tylenol for some pain relief and is not taking oxycodone. I informed him about tramadol as a alternative if he would like to try that one day. He declines at this time but will let me know in the future. His WBC is elevated at 33.4, hemoglobin is low at 12.4, and platelet count is 273,000 as of  today. His CMP is normal other than a elevated calcium of 10.3. He used to receive Lupron and Zometa injections monthly. I explained that the Zometa injections will help lower his calcium level so he might need it sooner than 3 months. We will monitor the calcium level and he will stay off any supplement. I believe he is tolerating treatment as expected. During physical exam I noticed several areas of rash on his skin with a large plaque like lesion in the left upper chest with erythematous edges which are irregular. He has similar areas of bilateral neck and this appears yeast like. He says there has been some improvement with the Triamcinolone but I will add a anti fungal medication. He informed me that these rashes come and go and that he also has some on his back. I believe this to be caused by yeast and I will prescribe Diflucan 100mg  for 10 days. He will meet with [facility] in 1 week with labs prior to his second cycle if he is stable we will go to 3 week follow-up.   He denies signs of infection such as sore throat, sinus drainage, cough, or urinary symptoms.  He denies fevers or recurrent chills. He denies pain. He denies nausea, vomiting, chest pain, dyspnea or cough. His  appetite is not as good as it was and her weight has increased 2 pounds over last 10 days . He is accompanied by his friend [name] at today's visit.    REVIEW OF SYSTEMS:  Review of Systems  Constitutional:  Positive for fatigue. Negative for appetite change, chills, diaphoresis, fever and unexpected weight change.  HENT:  Negative.  Negative for hearing loss, lump/mass, mouth sores, nosebleeds, sore throat, tinnitus, trouble swallowing and voice change.   Eyes: Negative.  Negative for eye problems and icterus.  Respiratory:  Negative for chest tightness, cough, hemoptysis, shortness of breath and wheezing.   Cardiovascular:  Negative for chest pain, leg swelling and palpitations.  Gastrointestinal:  Positive for diarrhea and nausea.  Negative for abdominal distention, abdominal pain, blood in stool, constipation, rectal pain and vomiting.  Endocrine: Negative.  Negative for hot flashes.  Genitourinary: Negative.  Negative for bladder incontinence, difficulty urinating, dyspareunia, dysuria, frequency, hematuria, nocturia, pelvic pain and penile discharge.   Musculoskeletal:  Positive for arthralgias (chronic) and back pain. Negative for flank pain, gait problem, myalgias, neck pain and neck stiffness.  Skin: Negative.  Negative for itching, rash and wound.  Neurological:  Positive for extremity weakness. Negative for dizziness, gait problem, headaches, light-headedness, numbness, seizures and speech difficulty.  Hematological: Negative.  Negative for adenopathy. Does not bruise/bleed easily.  Psychiatric/Behavioral: Negative.  Negative for confusion, decreased concentration, depression, sleep disturbance and suicidal ideas. The patient is not nervous/anxious.     VITALS:  Blood pressure 137/69, pulse 77, temperature 98 F (36.7 C), temperature source Oral, resp. rate 16, height 5\' 7"  (1.702 m), weight 193 lb 9.6 oz (87.8 kg), SpO2 93%.  Wt Readings from Last 3 Encounters:  01/11/23 195 lb (88.5 kg)  01/06/23 190 lb 12.8 oz (86.5 kg)  12/31/22 193 lb 9.6 oz (87.8 kg)    Body mass index is 30.32 kg/m.  Performance status (ECOG): 1 - Symptomatic but completely ambulatory  PHYSICAL EXAM:  Physical Exam Vitals and nursing note reviewed. Exam conducted with a chaperone present.  Constitutional:      General: He is not in acute distress.    Appearance: Normal appearance. He is normal weight. He is not ill-appearing, toxic-appearing or diaphoretic.  HENT:     Head: Normocephalic and atraumatic.     Right Ear: Tympanic membrane, ear canal and external ear normal. There is no impacted cerumen.     Left Ear: Tympanic membrane, ear canal and external ear normal. There is no impacted cerumen.     Nose: No congestion or  rhinorrhea.     Mouth/Throat:     Mouth: Mucous membranes are moist.     Pharynx: Oropharynx is clear. No oropharyngeal exudate or posterior oropharyngeal erythema.  Eyes:     General: No scleral icterus.       Right eye: No discharge.        Left eye: No discharge.     Extraocular Movements: Extraocular movements intact.     Conjunctiva/sclera: Conjunctivae normal.     Pupils: Pupils are equal, round, and reactive to light.  Neck:     Vascular: No carotid bruit.  Cardiovascular:     Rate and Rhythm: Normal rate and regular rhythm.     Pulses: Normal pulses.     Heart sounds: Normal heart sounds. No murmur heard.    No friction rub. No gallop.  Pulmonary:     Effort: Pulmonary effort is normal. No respiratory distress.     Breath sounds: Normal  breath sounds. No stridor. No wheezing, rhonchi or rales.  Chest:     Chest wall: No tenderness.     Comments: Several areas of rash on his skin with a large plaque like lesion in the left upper chest with erythematous edges which are irregular. He has similar areas of bilateral neck and this appears yeast like. He says there has been some improvement with the Triamcinolone but I will add a anti fungal medication. Abdominal:     General: Bowel sounds are normal. There is no distension.     Palpations: Abdomen is soft. There is no hepatomegaly, splenomegaly or mass.     Tenderness: There is no abdominal tenderness. There is no right CVA tenderness, left CVA tenderness, guarding or rebound.     Hernia: No hernia is present.  Musculoskeletal:        General: No swelling, tenderness, deformity or signs of injury. Normal range of motion.     Cervical back: Normal range of motion and neck supple. No rigidity or tenderness.     Right lower leg: No edema (trace).     Left lower leg: No edema (trace).  Lymphadenopathy:     Cervical: No cervical adenopathy.     Upper Body:     Right upper body: No supraclavicular or axillary adenopathy.     Left  upper body: No supraclavicular or axillary adenopathy.     Lower Body: No right inguinal adenopathy. No left inguinal adenopathy.  Skin:    General: Skin is warm and dry.     Coloration: Skin is not jaundiced or pale.     Findings: No bruising, erythema, lesion or rash.  Neurological:     General: No focal deficit present.     Mental Status: He is alert and oriented to person, place, and time. Mental status is at baseline.     Cranial Nerves: No cranial nerve deficit.     Sensory: No sensory deficit.     Motor: No weakness.     Coordination: Coordination normal.     Gait: Gait normal.     Deep Tendon Reflexes: Reflexes normal.  Psychiatric:        Mood and Affect: Mood normal.        Behavior: Behavior normal.        Thought Content: Thought content normal.        Judgment: Judgment normal.    LABS:       Latest Ref Rng & Units 01/06/2023   12:00 AM 12/31/2022   12:00 AM 12/14/2022   11:58 AM  CBC  WBC  27.6     33.4     11.5   Hemoglobin 13.5 - 17.5 12.2     12.4     12.5   Hematocrit 41 - 53 38     37     40.6   Platelets 150 - 400 K/uL 174     273     262      This result is from an external source.      Latest Ref Rng & Units 01/06/2023   12:00 AM 12/31/2022   12:00 AM 12/14/2022   11:58 AM  CMP  Glucose 70 - 99 mg/dL   562   BUN 4 - 21 15     12     16    Creatinine 0.6 - 1.3 0.7     0.8     0.81   Sodium 137 - 147 137  140     136   Potassium 3.5 - 5.1 mEq/L 3.9     3.9     3.4   Chloride 99 - 108 102     102     103   CO2 13 - 22 31     31     25    Calcium 8.7 - 10.7 9.0     10.3     9.1   Total Protein 6.5 - 8.1 g/dL   7.5   Total Bilirubin 0.3 - 1.2 mg/dL   0.3   Alkaline Phos 25 - 125 107     137     123   AST 14 - 40 35     50     30   ALT 10 - 40 U/L 28     27     20       This result is from an external source.   Component Ref Range & Units 1 d ago (08/03/22) 1 mo ago (06/11/22) 2 mo ago (05/24/22) 3 mo ago (05/03/22) 4 mo ago (04/02/22) 4 mo  ago (03/12/22) 5 mo ago (02/19/22)  Prostatic Specific Antigen 0.00 - 4.00 ng/mL 261.12 High  >150.00 High  VC, CM 203.44 High  CM 235.00 High  CM 406.00 High  CM 284.59 High  CM 403.39 High  CM     No results found for: "CEA1", "CEA" / No results found for: "CEA1", "CEA" Lab Results  Component Value Date   PSA1 1,317.0 (H) 01/06/2023    No results found for: "XBJ478" No results found for: "CAN125"  No results found for: "TOTALPROTELP", "ALBUMINELP", "A1GS", "A2GS", "BETS", "BETA2SER", "GAMS", "MSPIKE", "SPEI" Lab Results  Component Value Date   TIBC 398 01/29/2022   FERRITIN 206 01/29/2022   IRONPCTSAT 25 01/29/2022   No results found for: "LDH"  STUDIES:  EXAM: 12/06/2022 NUCLEAR MEDICINE PET SKULL BASE TO THIGH IMPRESSION: 1. Progression of skeletal metastasis. Multiple new radiotracer avid lesions within the spine. 2. Individual lesions are increased in size compared to prior accompanied by sclerotic CT change. 3. 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 4. No evidence of nodal metastasis or visceral metastasis   Exam: 08/04/2022 MRI Head without and With Contrast Impression: No evidence of acute intracranial abnormality.   Exam: 06/29/2022 EKG Impression: N/A  Exam: 04/17/2022 CT abdomen and Pelvis without Contrast Impression: Decreased sensitivity exam secondary to lack of contrast and minimal motion degradation. Fluid-filled colon, suggesting a diarrheal state. No other explanation for patient's acute symptoms. Prostatectomy. Sclerotic lesions including within the anterior seventh right rib and right iliac are suspicious for progressive osseous metastasis. No findings of extraosseous metastatic disease within the abdomen or pelvis. New lingular nodules up to 7mm are indeterminate. Consider non-emergent, outpatient chest CT to evaluate for pulmonary metastasis. Small bowel non rotation. Hepatic Steatosis.    HISTORY:   Past  Medical History:  Diagnosis Date   Hyperlipidemia    Malignant neoplasm of prostate (HCC)    Supraventricular tachycardia (HCC)     Past Surgical History:  Procedure Laterality Date   CORONARY STENT INTERVENTION N/A 02/08/2022   Procedure: CORONARY STENT INTERVENTION;  Surgeon: Yvonne Kendall, MD;  Location: [facility];  Service: Cardiovascular;  Laterality: N/A;   LEFT HEART CATH AND CORONARY ANGIOGRAPHY N/A 02/08/2022   Procedure: LEFT HEART CATH AND CORONARY ANGIOGRAPHY;  Surgeon: Yvonne Kendall, MD;  Location: [facility];  Service: Cardiovascular;  Laterality: N/A;   PROSTATECTOMY  07/2014   TONSILLECTOMY      Family History  Problem Relation Age of Onset   Breast cancer Mother 37   Ovarian cancer Mother 52   Breast cancer Paternal Grandmother 34    Social History:  reports that he has never smoked. He has never used smokeless tobacco. He reports that he does not currently use alcohol. He reports that he does not use drugs.The patient is alone today.  Allergies:  Allergies  Allergen Reactions   Atorvastatin    Rosuvastatin Other (See Comments)    Other reaction(s): Muscle pain   Simvastatin Other (See Comments)    Other reaction(s): Joint pain, Muscle pain   Sulfa Antibiotics    Sulfamethoxazole-Trimethoprim Other (See Comments)    Unknown as to interaction was a baby.    Current Medications: Current Outpatient Medications  Medication Sig Dispense Refill   Alirocumab (PRALUENT [place]) Inject into the skin. One injection every 2 weeks Monday     APPLE CIDER VINEGAR PO Take by mouth. (Patient not taking: Reported on 01/06/2023)     Cholecalciferol 25 MCG (1000 UT) tablet Take 1,000 Units by mouth in the morning and at bedtime.     clopidogrel (PLAVIX) 75 MG tablet Take 75 mg by mouth daily.     diphenhydrAMINE (BENADRYL) 25 mg capsule Take 1 capsule by mouth at bedtime.     diphenhydrAMINE (BENADRYL) 50 MG capsule Take 50 mg by mouth at bedtime as  needed.     ezetimibe (ZETIA) 10 MG tablet Take 10 mg by mouth daily.     fluconazole (DIFLUCAN) 100 MG tablet Take 1 tablet (100 mg total) by mouth daily. (Patient not taking: Reported on 01/06/2023) 10 tablet 0   isosorbide mononitrate (IMDUR) 30 MG 24 hr tablet Take 1 tablet by mouth every 8 (eight) hours as needed.     leuprolide (LUPRON) 22.5 MG injection Inject 22.5 mg into the muscle every 3 (three) months.     metoprolol tartrate (LOPRESSOR) 25 MG tablet Take 12.5 mg by mouth 2 (two) times daily.     morphine (MS CONTIN) 15 MG 12 hr tablet Take 1 tablet (15 mg total) by mouth every 12 (twelve) hours. 60 tablet 0   Multiple Vitamins-Minerals (MULTIVITAMIN WITH MINERALS) tablet Take 1 tablet by mouth daily.     nitroGLYCERIN (NITROSTAT) 0.4 MG SL tablet Place 0.4 mg under the tongue every 5 (five) minutes as needed for chest pain. Repeat every 5 minutes if needed for a total of 3 tablets in 15 minutes. If no relief, CALL 911.     Omega-3 Fatty Acids (FISH OIL) 1000 MG CAPS Take 2,000 mg by mouth in the morning and at bedtime.     ondansetron (ZOFRAN) 8 MG tablet Take 8 mg by mouth every 8 (eight) hours as needed for nausea or vomiting.     polyethylene glycol powder (GLYCOLAX/MIRALAX) 17 GM/SCOOP powder Take 1 Container by mouth once.     predniSONE (DELTASONE) 5 MG tablet Take 1 tablet (5 mg total) by mouth 2 (two) times daily with a meal. 60 tablet 5   prochlorperazine (COMPAZINE) 10 MG tablet Take 10 mg by mouth every 6 (six) hours as needed for nausea or vomiting.     Royal Jelly-Bee Pollen-Ginseng (KOREAN GINSENG COMPLEX) 150-250-50 MG CAPS Take 1 capsule by mouth daily.     triamcinolone (KENALOG) 0.025 % cream Apply 1 Application topically 2 (two) times daily. 30 g 0   Zoledronic Acid (ZOMETA) 4 MG/100ML IVPB Inject 4  mg into the vein every 3 (three) months.     No current facility-administered medications for this visit.     I,Jasmine M Lassiter,acting as a scribe for [name], MD.,have documented all relevant documentation on the behalf of [name], MD,as directed by  [name], MD while in the presence of [name], MD.

## 2022-12-31 ENCOUNTER — Other Ambulatory Visit: Payer: Self-pay | Admitting: Oncology

## 2022-12-31 ENCOUNTER — Inpatient Hospital Stay: Payer: No Typology Code available for payment source | Admitting: Oncology

## 2022-12-31 ENCOUNTER — Encounter: Payer: Self-pay | Admitting: Oncology

## 2022-12-31 ENCOUNTER — Other Ambulatory Visit: Payer: Self-pay | Admitting: Hematology and Oncology

## 2022-12-31 ENCOUNTER — Inpatient Hospital Stay: Payer: No Typology Code available for payment source

## 2022-12-31 VITALS — BP 137/69 | HR 77 | Temp 98.0°F | Resp 16 | Ht 67.0 in | Wt 193.6 lb

## 2022-12-31 DIAGNOSIS — C61 Malignant neoplasm of prostate: Secondary | ICD-10-CM

## 2022-12-31 DIAGNOSIS — C7800 Secondary malignant neoplasm of unspecified lung: Secondary | ICD-10-CM | POA: Diagnosis not present

## 2022-12-31 DIAGNOSIS — C7952 Secondary malignant neoplasm of bone marrow: Secondary | ICD-10-CM

## 2022-12-31 DIAGNOSIS — C7951 Secondary malignant neoplasm of bone: Secondary | ICD-10-CM | POA: Diagnosis not present

## 2022-12-31 DIAGNOSIS — Z5111 Encounter for antineoplastic chemotherapy: Secondary | ICD-10-CM | POA: Diagnosis not present

## 2022-12-31 DIAGNOSIS — B379 Candidiasis, unspecified: Secondary | ICD-10-CM

## 2022-12-31 LAB — BASIC METABOLIC PANEL
BUN: 12 (ref 4–21)
CO2: 31 — AB (ref 13–22)
Chloride: 102 (ref 99–108)
Creatinine: 0.8 (ref 0.6–1.3)
Glucose: 119
Potassium: 3.9 meq/L (ref 3.5–5.1)
Sodium: 140 (ref 137–147)

## 2022-12-31 LAB — CBC AND DIFFERENTIAL
HCT: 37 — AB (ref 41–53)
Hemoglobin: 12.4 — AB (ref 13.5–17.5)
Neutrophils Absolute: 21.71
Platelets: 273 10*3/uL (ref 150–400)
WBC: 33.4

## 2022-12-31 LAB — HEPATIC FUNCTION PANEL
ALT: 27 U/L (ref 10–40)
AST: 50 — AB (ref 14–40)
Alkaline Phosphatase: 137 — AB (ref 25–125)
Bilirubin, Total: 0.3

## 2022-12-31 LAB — COMPREHENSIVE METABOLIC PANEL
Albumin: 4.3 (ref 3.5–5.0)
Calcium: 10.3 (ref 8.7–10.7)

## 2022-12-31 LAB — CBC: RBC: 4.39 (ref 3.87–5.11)

## 2022-12-31 MED ORDER — FLUCONAZOLE 100 MG PO TABS
100.0000 mg | ORAL_TABLET | Freq: Every day | ORAL | 0 refills | Status: DC
Start: 2022-12-31 — End: 2023-05-25

## 2023-01-04 ENCOUNTER — Encounter: Payer: Self-pay | Admitting: Oncology

## 2023-01-05 NOTE — Progress Notes (Signed)
[facility]  [address] [place],  [place]  [address] [phone]  Clinic Day:  01/06/2023  Referring physician: [name], MD  ASSESSMENT & PLAN:   Assessment & Plan: Malignant neoplasm of prostate [facility]) Stage IVA prostate cancer diagnosed in January 2016.  He had progressive disease in the bone and lung in September 2023, so was placed on docetaxel chemotherapy and continued leuprolide and zoledronic acid.  His PSA went down from 536 to 400.75 after 3 cycles, then to 284 with the 5th cycle, and then 203 in March 2024. PSA then went up to 261 in May. We decided to give him a break from chemotherapy at that time.  We continued leuprolide and zolendronic acid every 3 months. The PSA was 1156. PSMA PET in September revealed progression of skeletal metastasis. Multiple new radiotracer avid lesions within the spine. Individual lesions are increased in size compared to prior accompanied by sclerotic CT change. 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 No evidence of nodal metastasis or visceral metastasis.   We recommended palliative chemotherapy with cabazitaxel/prednisone. His PSA was up to 1608 prior to starting.  He received his 1st cycle of cabazitaxel/prednisone on October 8.  He tolerated this fairly well with fatigue and mild nausea. PSA is pending from today.  He will proceed with a 2nd cycle next week. We will plan to see him back in 3 weeks prior to a 3rd cycle of cabazitaxel/prednisone.  Secondary malignant neoplasm of bone and bone marrow (HCC) He continues zoledronic acid every 3 months in addition to his cancer therapy. He had marked progression of his bone metastases in August 2023, but minimal symptoms, and was placed on docetaxel chemotherapy.  That was stopped in May 2024.  He then had a significant increase in his PSA and progression of the bone metastases.  PET scan revealed multiple new lesions of the spine  compared to August 2023, including T12-L1 and L2, sacrum, multiple rib lesions, multiple scapular lesions and pelvic lesions.  Other lesions so increased in size.  He has very widespread skeletal metastases, but no evidence of nodal or visceral metastasis.  He will continue zoledronic acid and leuprolide every 3 months.  He is now receiving chemotherapy with cabazitaxel/prednisone.  Leucocytosis Leukocytosis secondary to pegfilgrastim.  We recommend continuing this to prevent complications of prolonged neutropenia associated with chemotherapy.  Anemia He had an upper GI hemorrhage earlier this year and was referred to Dr. [name]. Fortunately, he just had the one episode and was transfused. His hemoglobin came up to 11.1 and his hemoglobin remained stable at 11.3. He had a EGD scheduled in April, but it was canceled. His cardiologist Dr. Allyson Sabal was not comfortable with him stopping clopidogrel for another year. His bleeding has stopped and so we will not pursue an EGD at this time unless he begins having melena again and/or a significant drop in his hemoglobin.  His hemoglobin is fairly stable at 12.2 with an MCV of 87.    The patient understands the plans discussed today and is in agreement with them.  He knows to contact our office if he develops concerns prior to his next appointment.   I provided 30 minutes of face-to-face time during this encounter and > 50% was spent counseling as documented under my assessment and plan.    Adah Perl, PA-C  [facility] [address] [place] [address] Dept: [phone] Dept Fax: [phone]  Orders Placed This Encounter  Procedures   CBC and differential    This external order was created through the Results Console.   CBC    This external order was created through the Results Console.   Basic metabolic panel    This external order was created through the Results  Console.   Comprehensive metabolic panel    This external order was created through the Results Console.   Hepatic function panel    This external order was created through the Results Console.      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 He was initially treated with leuprolide 22.5 mg, and zoledronic acid every 3 months.  Enzalutamide was added in June 2018.  His bone pain has been controlled with MS Contin 15 mg twice daily with occasional Tylenol for breakthrough.  He had a steadily rising PSA, up to 83.49 in November 2022.  PSMA PET in October 2022 revealed an oligometastasis skeletal lesion it in the anterior right 7th rib, with increased activity, and very subtle sclerotic change at T10, but no evidence of progressive disease.The PSA had continued to fluctuate up and down.  It then increased dramatically to 334 in August 2023.  PSMA PET scan revealed significant progression of his metastatic bone disease, now innumerable including major lesions at L3, right iliac wing and left pubis with several tiny lung nodules.  Enzalutamide was discontinued and he was started on docetaxel chemotherapy in September 2023.  He tolerated this fairly well.  He continues leuprolide and zoledronic acid every 3 months.  His PSA went down from 536 to 400.75 after 3 cycles and went down to 284 with the 5th cycle, but then back up to 406 with his 6th cycle. We continued docetaxel.    In February his PSA went down to 235 and then PSA was >150 in March.  He developed severe anemia with a hemoglobin of 7 in March requiring transfusion.  This was felt to be due to the upper GI hemorrhage as he reported melena.  EGD was not able to be done as Dr. Allyson Sabal wanted him to continue clopidogrel for at least  another year before holding it.  Chemotherapy was on hold during that time.  In May, he developed double vision, so underwent MRI head.  This did not reveal any acute intracranial abnormality.  The PSA had increased to 61.  We suggested a break from chemotherapy, so docetaxel was discontinued.  Leuprolide and zoledronic acid every 3 months were continued.  His PSA then significantly increased in August to 1156.  At that time, he had acute COVID.  After he recovered, he underwent PSMA PET in September which revealed progression of skeletal metastasis. Multiple new radiotracer avid lesions within the spine. Individual lesions are increased in size compared to prior accompanied by sclerotic CT change. 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 No evidence of nodal metastasis or visceral metastasis.  We then recommended alternative treatment with cabazitaxel and prednisone, in addition to leuprolide and zoledronic acid.  Oncology History  Malignant neoplasm of prostate (HCC)  03/30/2014 Cancer Staging   Staging form: Prostate, AJCC 8th Edition - Clinical stage from 03/30/2014: Stage IVA (cT3b, cN1, cM0, PSA: 51.8, Grade Group: 4) - Signed by [name], MD on 11/14/2020 Histopathologic type: Adenocarcinoma, NOS Stage prefix:  Initial diagnosis Prostate specific antigen (PSA) range: 20 or greater Gleason primary pattern: 4 Gleason secondary pattern: 4 Gleason score: 8 Histologic grading system: 5 grade system Location of positive needle core biopsies: Beyond primary site Prognostic indicators: May 2016 Robotic prostatectomy with mult. Pos margins, extra prostatic extension, 2/6 pos nodes. Scan June 2016 positive at T10  PSA up to 216.3 by August Placed on lupron and casodex Stage used in treatment planning: Yes National guidelines used in treatment planning: Yes Type of national guideline used in treatment planning: NCCN   02/18/2020 Initial Diagnosis   Malignant  neoplasm of prostate (HCC)   12/01/2021 - 07/19/2022 Chemotherapy   Patient is on Treatment Plan : PROSTATE Docetaxel (75) + Prednisone q21d     12/21/2022 -  Chemotherapy   Patient is on Treatment Plan : PROSTATE Cabazitaxel (20) D1 + Prednisone D1-21 q21d     Secondary malignant neoplasm of bone and bone marrow (HCC)  02/18/2020 Initial Diagnosis   Secondary malignant neoplasm of bone and bone marrow (HCC)   12/01/2021 - 07/19/2022 Chemotherapy   Patient is on Treatment Plan : PROSTATE Docetaxel (75) + Prednisone q21d     12/21/2022 -  Chemotherapy   Patient is on Treatment Plan : PROSTATE Cabazitaxel (20) D1 + Prednisone D1-21 q21d     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  He states he continues prednisone 5 mg twice daily as prescribed.  He tolerated his first cycle fairly well except for an episode of severe fatigue, as well as mild nausea relieved with medication.  He reports continued fatigue relieved with rest.  He denies fevers or chills. He denies pain. His appetite is good. His weight has decreased 3 pounds over last week .  REVIEW OF SYSTEMS:  Review of Systems  Constitutional:  Positive for appetite change, fatigue and unexpected weight change. Negative for chills and fever.  HENT:   Negative for lump/mass, mouth sores and sore throat.   Respiratory:  Negative for cough and shortness of breath.   Cardiovascular:  Negative for chest pain and leg swelling.  Gastrointestinal:  Positive for nausea. Negative for abdominal pain, constipation, diarrhea and vomiting.  Genitourinary:  Negative for difficulty urinating, dysuria, frequency and hematuria.    Musculoskeletal:  Negative for arthralgias, back pain and myalgias.  Skin:  Negative for itching, rash and wound.  Neurological:  Negative for dizziness, extremity weakness, headaches, light-headedness and numbness.  Hematological:  Negative for adenopathy. Bruises/bleeds easily (Easy bruising).  Psychiatric/Behavioral:  Negative for depression and sleep disturbance. The patient is not nervous/anxious.      VITALS:  Blood pressure 110/64, pulse 68, temperature 98.3 F (36.8 C), temperature source Oral, resp. rate 20, height 5\' 7"  (1.702 m), weight 190 lb 12.8 oz (86.5 kg), SpO2 96%.  Wt Readings from Last 3 Encounters:  01/06/23 190 lb 12.8 oz (86.5 kg)  12/31/22 193 lb 9.6 oz (87.8 kg)  12/22/22 195 lb 4 oz (88.6 kg)    Body mass index is 29.88 kg/m.  Performance status (ECOG): 1 - Symptomatic but completely ambulatory  PHYSICAL EXAM:  Physical Exam Vitals and nursing note reviewed.  Constitutional:      General: He is not in acute distress.    Appearance: Normal appearance. He is normal weight.  HENT:     Head: Normocephalic and atraumatic.     Mouth/Throat:     Mouth: Mucous membranes are moist.     Pharynx: Oropharynx is clear. No oropharyngeal exudate or posterior oropharyngeal erythema.  Eyes:     General: No scleral icterus.    Extraocular Movements: Extraocular movements intact.     Conjunctiva/sclera: Conjunctivae normal.     Pupils: Pupils are equal, round, and reactive to light.  Cardiovascular:     Rate and Rhythm: Normal rate and regular rhythm.     Heart sounds: Normal heart sounds. No murmur heard.    No friction rub. No gallop.  Pulmonary:     Effort: Pulmonary effort is normal.     Breath sounds: Normal breath sounds. No wheezing, rhonchi or rales.  Abdominal:     General: Bowel sounds are normal. There is no distension.     Palpations: Abdomen is soft. There is no hepatomegaly, splenomegaly or mass.     Tenderness: There is no abdominal tenderness.   Musculoskeletal:        General: Normal range of motion.     Cervical back: Normal range of motion and neck supple. No tenderness.     Right lower leg: No edema.     Left lower leg: No edema.  Lymphadenopathy:     Cervical: No cervical adenopathy.     Upper Body:     Right upper body: No supraclavicular or axillary adenopathy.     Left upper body: No supraclavicular or axillary adenopathy.     Lower Body: No right inguinal adenopathy. No left inguinal adenopathy.  Skin:    General: Skin is warm and dry.     Coloration: Skin is not jaundiced.     Findings: No rash.  Neurological:     Mental Status: He is alert and oriented to person, place, and time.     Cranial Nerves: No cranial nerve deficit.  Psychiatric:        Mood and Affect: Mood normal.        Behavior: Behavior normal.        Thought Content: Thought content normal.     LABS:      Latest Ref Rng & Units 01/06/2023   12:00 AM 12/31/2022   12:00 AM 12/14/2022   11:58 AM  CBC  WBC  27.6     33.4     11.5   Hemoglobin 13.5 - 17.5 12.2     12.4     12.5   Hematocrit 41 - 53 38     37     40.6   Platelets 150 - 400 K/uL 174     273     262      This result is from an external source.      Latest Ref Rng & Units 01/06/2023   12:00 AM 12/31/2022   12:00 AM 12/14/2022   11:58 AM  CMP  Glucose 70 - 99 mg/dL   829   BUN 4 - 21 15     12     16    Creatinine 0.6 - 1.3 0.7     0.8     0.81   Sodium 137 - 147 137     140     136   Potassium 3.5 - 5.1 mEq/L 3.9  3.9     3.4   Chloride 99 - 108 102     102     103   CO2 13 - 22 31     31     25    Calcium 8.7 - 10.7 9.0     10.3     9.1   Total Protein 6.5 - 8.1 g/dL   7.5   Total Bilirubin 0.3 - 1.2 mg/dL   0.3   Alkaline Phos 25 - 125 107     137     123   AST 14 - 40 35     50     30   ALT 10 - 40 U/L 28     27     20       This result is from an external source.     No results found for: "CEA1", "CEA" / No results found for: "CEA1", "CEA" Lab Results   Component Value Date   PSA1 334.0 (H) 10/30/2021   No results found for: "NGE952" No results found for: "CAN125"  No results found for: "TOTALPROTELP", "ALBUMINELP", "A1GS", "A2GS", "BETS", "BETA2SER", "GAMS", "MSPIKE", "SPEI" Lab Results  Component Value Date   TIBC 398 01/29/2022   FERRITIN 206 01/29/2022   IRONPCTSAT 25 01/29/2022   No results found for: "LDH"  STUDIES:  No results found.    HISTORY:   Past Medical History:  Diagnosis Date   Hyperlipidemia    Malignant neoplasm of prostate (HCC)    Supraventricular tachycardia (HCC)     Past Surgical History:  Procedure Laterality Date   CORONARY STENT INTERVENTION N/A 02/08/2022   Procedure: CORONARY STENT INTERVENTION;  Surgeon: Yvonne Kendall, MD;  Location: [facility];  Service: Cardiovascular;  Laterality: N/A;   LEFT HEART CATH AND CORONARY ANGIOGRAPHY N/A 02/08/2022   Procedure: LEFT HEART CATH AND CORONARY ANGIOGRAPHY;  Surgeon: Yvonne Kendall, MD;  Location: [facility];  Service: Cardiovascular;  Laterality: N/A;   PROSTATECTOMY  07/2014   TONSILLECTOMY      Family History  Problem Relation Age of Onset   Breast cancer Mother 53   Ovarian cancer Mother 40   Breast cancer Paternal Grandmother 33    Social History:  reports that he has never smoked. He has never used smokeless tobacco. He reports that he does not currently use alcohol. He reports that he does not use drugs.The patient is alone today.  Allergies:  Allergies  Allergen Reactions   Atorvastatin    Rosuvastatin Other (See Comments)    Other reaction(s): Muscle pain   Simvastatin Other (See Comments)    Other reaction(s): Joint pain, Muscle pain   Sulfa Antibiotics    Sulfamethoxazole-Trimethoprim Other (See Comments)    Unknown as to interaction was a baby.    Current Medications: Current Outpatient Medications  Medication Sig Dispense Refill   Alirocumab (PRALUENT [place]) Inject into the skin. One injection every 2  weeks Monday     APPLE CIDER VINEGAR PO Take by mouth. (Patient not taking: Reported on 01/06/2023)     Cholecalciferol 25 MCG (1000 UT) tablet Take 1,000 Units by mouth in the morning and at bedtime.     clopidogrel (PLAVIX) 75 MG tablet Take 75 mg by mouth daily.     diphenhydrAMINE (BENADRYL) 25 mg capsule Take 1 capsule by mouth at bedtime.     diphenhydrAMINE (BENADRYL) 50 MG capsule Take 50 mg by mouth at bedtime as needed.     ezetimibe (  ZETIA) 10 MG tablet Take 10 mg by mouth daily.     fluconazole (DIFLUCAN) 100 MG tablet Take 1 tablet (100 mg total) by mouth daily. (Patient not taking: Reported on 01/06/2023) 10 tablet 0   isosorbide mononitrate (IMDUR) 30 MG 24 hr tablet Take 1 tablet by mouth every 8 (eight) hours as needed.     leuprolide (LUPRON) 22.5 MG injection Inject 22.5 mg into the muscle every 3 (three) months.     metoprolol tartrate (LOPRESSOR) 25 MG tablet Take 12.5 mg by mouth 2 (two) times daily.     morphine (MS CONTIN) 15 MG 12 hr tablet Take 1 tablet (15 mg total) by mouth every 12 (twelve) hours. 60 tablet 0   Multiple Vitamins-Minerals (MULTIVITAMIN WITH MINERALS) tablet Take 1 tablet by mouth daily.     nitroGLYCERIN (NITROSTAT) 0.4 MG SL tablet Place 0.4 mg under the tongue every 5 (five) minutes as needed for chest pain. Repeat every 5 minutes if needed for a total of 3 tablets in 15 minutes. If no relief, CALL 911.     Omega-3 Fatty Acids (FISH OIL) 1000 MG CAPS Take 2,000 mg by mouth in the morning and at bedtime.     ondansetron (ZOFRAN) 8 MG tablet Take 8 mg by mouth every 8 (eight) hours as needed for nausea or vomiting.     polyethylene glycol powder (GLYCOLAX/MIRALAX) 17 GM/SCOOP powder Take 1 Container by mouth once.     predniSONE (DELTASONE) 5 MG tablet Take 1 tablet (5 mg total) by mouth 2 (two) times daily with a meal. 60 tablet 5   prochlorperazine (COMPAZINE) 10 MG tablet Take 10 mg by mouth every 6 (six) hours as needed for nausea or vomiting.      Royal Jelly-Bee Pollen-Ginseng (KOREAN GINSENG COMPLEX) 150-250-50 MG CAPS Take 1 capsule by mouth daily.     triamcinolone (KENALOG) 0.025 % cream Apply 1 Application topically 2 (two) times daily. 30 g 0   Zoledronic Acid (ZOMETA) 4 MG/100ML IVPB Inject 4 mg into the vein every 3 (three) months.     No current facility-administered medications for this visit.

## 2023-01-06 ENCOUNTER — Other Ambulatory Visit: Payer: Self-pay

## 2023-01-06 ENCOUNTER — Inpatient Hospital Stay (INDEPENDENT_AMBULATORY_CARE_PROVIDER_SITE_OTHER): Payer: No Typology Code available for payment source | Admitting: Hematology and Oncology

## 2023-01-06 ENCOUNTER — Inpatient Hospital Stay: Payer: No Typology Code available for payment source

## 2023-01-06 ENCOUNTER — Encounter: Payer: Self-pay | Admitting: Hematology and Oncology

## 2023-01-06 VITALS — BP 110/64 | HR 68 | Temp 98.3°F | Resp 20 | Ht 67.0 in | Wt 190.8 lb

## 2023-01-06 DIAGNOSIS — C61 Malignant neoplasm of prostate: Secondary | ICD-10-CM

## 2023-01-06 DIAGNOSIS — D649 Anemia, unspecified: Secondary | ICD-10-CM

## 2023-01-06 DIAGNOSIS — D72829 Elevated white blood cell count, unspecified: Secondary | ICD-10-CM | POA: Diagnosis not present

## 2023-01-06 DIAGNOSIS — C7951 Secondary malignant neoplasm of bone: Secondary | ICD-10-CM

## 2023-01-06 DIAGNOSIS — Z5111 Encounter for antineoplastic chemotherapy: Secondary | ICD-10-CM | POA: Diagnosis not present

## 2023-01-06 DIAGNOSIS — C7800 Secondary malignant neoplasm of unspecified lung: Secondary | ICD-10-CM

## 2023-01-06 DIAGNOSIS — C7952 Secondary malignant neoplasm of bone marrow: Secondary | ICD-10-CM

## 2023-01-06 LAB — CBC AND DIFFERENTIAL
HCT: 38 — AB (ref 41–53)
Hemoglobin: 12.2 — AB (ref 13.5–17.5)
MCV: 87 (ref 80–94)
Neutrophils Absolute: 19.87
Platelets: 174 10*3/uL (ref 150–400)
WBC: 27.6

## 2023-01-06 LAB — HEPATIC FUNCTION PANEL
ALT: 28 U/L (ref 10–40)
AST: 35 (ref 14–40)
Alkaline Phosphatase: 107 (ref 25–125)
Bilirubin, Total: 0.4

## 2023-01-06 LAB — BASIC METABOLIC PANEL
BUN: 15 (ref 4–21)
CO2: 31 — AB (ref 13–22)
Chloride: 102 (ref 99–108)
Creatinine: 0.7 (ref 0.6–1.3)
Glucose: 137
Potassium: 3.9 meq/L (ref 3.5–5.1)
Sodium: 137 (ref 137–147)

## 2023-01-06 LAB — CBC: RBC: 4.44 (ref 3.87–5.11)

## 2023-01-06 LAB — COMPREHENSIVE METABOLIC PANEL
Albumin: 4.2 (ref 3.5–5.0)
Calcium: 9 (ref 8.7–10.7)

## 2023-01-06 NOTE — Assessment & Plan Note (Addendum)
Stage IVA prostate cancer diagnosed in January 2016.  He had progressive disease in the bone and lung in September 2023, so was placed on docetaxel chemotherapy and continued leuprolide and zoledronic acid.  His PSA went down from 536 to 400.75 after 3 cycles, then to 284 with the 5th cycle, and then 203 in March 2024. PSA then went up to 261 in May. We decided to give him a break from chemotherapy at that time.  We continued leuprolide and zolendronic acid every 3 months. The PSA was 1156. PSMA PET in September revealed progression of skeletal metastasis. Multiple new radiotracer avid lesions within the spine. Individual lesions are increased in size compared to prior accompanied by sclerotic CT change. 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 No evidence of nodal metastasis or visceral metastasis.   We recommended palliative chemotherapy with cabazitaxel/prednisone. His PSA was up to 1608 prior to starting.  He received his 1st cycle of cabazitaxel/prednisone on October 8.  He tolerated this fairly well with fatigue and mild nausea. PSA is pending from today.  He will proceed with a 2nd cycle next week. We will plan to see him back in 3 weeks prior to a 3rd cycle of cabazitaxel/prednisone.

## 2023-01-06 NOTE — Assessment & Plan Note (Signed)
He continues zoledronic acid every 3 months in addition to his cancer therapy. He had marked progression of his bone metastases in August 2023, but minimal symptoms, and was placed on docetaxel chemotherapy.  That was stopped in May 2024.  He then had a significant increase in his PSA and progression of the bone metastases.  PET scan revealed multiple new lesions of the spine compared to August 2023, including T12-L1 and L2, sacrum, multiple rib lesions, multiple scapular lesions and pelvic lesions.  Other lesions so increased in size.  He has very widespread skeletal metastases, but no evidence of nodal or visceral metastasis.  He will continue zoledronic acid and leuprolide every 3 months.  He is now receiving chemotherapy with cabazitaxel/prednisone.

## 2023-01-06 NOTE — Assessment & Plan Note (Signed)
Leukocytosis secondary to pegfilgrastim.  We recommend continuing this to prevent complications of prolonged neutropenia associated with chemotherapy.

## 2023-01-06 NOTE — Assessment & Plan Note (Signed)
He had an upper GI hemorrhage earlier this year and was referred to Dr. [name]. Fortunately, he just had the one episode and was transfused. His hemoglobin came up to 11.1 and his hemoglobin remained stable at 11.3. He had a EGD scheduled in April, but it was canceled. His cardiologist Dr. Allyson Sabal was not comfortable with him stopping clopidogrel for another year. His bleeding has stopped and so we will not pursue an EGD at this time unless he begins having melena again and/or a significant drop in his hemoglobin.  His hemoglobin is fairly stable at 12.2 with an MCV of 87.

## 2023-01-07 LAB — PROSTATE-SPECIFIC AG, SERUM (LABCORP): Prostate Specific Ag, Serum: 1317 ng/mL — ABNORMAL HIGH (ref 0.0–4.0)

## 2023-01-08 ENCOUNTER — Other Ambulatory Visit: Payer: Self-pay

## 2023-01-10 ENCOUNTER — Encounter: Payer: Self-pay | Admitting: Oncology

## 2023-01-11 ENCOUNTER — Inpatient Hospital Stay: Payer: No Typology Code available for payment source

## 2023-01-11 VITALS — BP 122/70 | HR 70 | Temp 98.0°F | Resp 20 | Ht 67.0 in | Wt 195.0 lb

## 2023-01-11 DIAGNOSIS — Z5111 Encounter for antineoplastic chemotherapy: Secondary | ICD-10-CM | POA: Diagnosis not present

## 2023-01-11 DIAGNOSIS — C61 Malignant neoplasm of prostate: Secondary | ICD-10-CM

## 2023-01-11 DIAGNOSIS — C7951 Secondary malignant neoplasm of bone: Secondary | ICD-10-CM

## 2023-01-11 MED ORDER — SODIUM CHLORIDE 0.9% FLUSH
10.0000 mL | INTRAVENOUS | Status: DC | PRN
  Administered 2023-01-11: 10 mL

## 2023-01-11 MED ORDER — FAMOTIDINE IN NACL 20-0.9 MG/50ML-% IV SOLN
20.0000 mg | Freq: Once | INTRAVENOUS | Status: AC
  Administered 2023-01-11: 20 mg via INTRAVENOUS
  Filled 2023-01-11: qty 50

## 2023-01-11 MED ORDER — HEPARIN SOD (PORK) LOCK FLUSH 100 UNIT/ML IV SOLN
500.0000 [IU] | Freq: Once | INTRAVENOUS | Status: AC | PRN
  Administered 2023-01-11: 500 [IU]

## 2023-01-11 MED ORDER — SODIUM CHLORIDE 0.9 % IV SOLN
20.0000 mg/m2 | Freq: Once | INTRAVENOUS | Status: AC
  Administered 2023-01-11: 41 mg via INTRAVENOUS
  Filled 2023-01-11: qty 4.1

## 2023-01-11 MED ORDER — SODIUM CHLORIDE 0.9 % IV SOLN: Freq: Once | INTRAVENOUS | Status: AC

## 2023-01-11 MED ORDER — DEXAMETHASONE SODIUM PHOSPHATE 10 MG/ML IJ SOLN
10.0000 mg | Freq: Once | INTRAMUSCULAR | Status: AC
Start: 2023-01-11 — End: 2023-01-11
  Administered 2023-01-11: 10 mg via INTRAVENOUS
  Filled 2023-01-11: qty 1

## 2023-01-11 MED ORDER — DIPHENHYDRAMINE HCL 50 MG/ML IJ SOLN
25.0000 mg | Freq: Once | INTRAMUSCULAR | Status: AC
  Administered 2023-01-11: 25 mg via INTRAVENOUS
  Filled 2023-01-11: qty 1

## 2023-01-11 NOTE — Patient Instructions (Signed)
Cabazitaxel Injection What is this medication? CABAZITAXEL (ka BAZ i TAX el) treats prostate cancer. It works by slowing down the growth of cancer cells. This medicine may be used for other purposes; ask your health care provider or pharmacist if you have questions. COMMON BRAND NAME(S): Jevtana What should I tell my care team before I take this medication? They need to know if you have any of these conditions: Kidney problems Liver disease Low white blood cell levels Lung disease Stomach or intestine problems An unusual or allergic reaction to cabazitaxel, polysorbate 80, other medications, foods, dyes, or preservatives Pregnant or trying to get pregnant Breast-feeding How should I use this medication? This medication is injected into a vein. It is given by your care team in a hospital or clinic setting. Talk to your care team about the use of this medication in children. Special care may be needed. Overdosage: If you think you have taken too much of this medicine contact a poison control center or emergency room at once. NOTE: This medicine is only for you. Do not share this medicine with others. What if I miss a dose? Keep appointments for follow-up doses. It is important not to miss your dose. Call your care team if you are unable to keep an appointment. What may interact with this medication? Certain antibiotics, such as clarithromycin or telithromycin Certain antivirals for HIV or AIDS Certain medications for fungal infections like ketoconazole, itraconazole, and voriconazole Nefazodone This list may not describe all possible interactions. Give your health care provider a list of all the medicines, herbs, non-prescription drugs, or dietary supplements you use. Also tell them if you smoke, drink alcohol, or use illegal drugs. Some items may interact with your medicine. What should I watch for while using this medication? This medication may make you feel generally unwell. This is  not uncommon as chemotherapy can affect healthy cells as well as cancer cells. Report any side effects. Continue your course of treatment even though you feel ill unless your care team tells you to stop. You may need blood work while you are taking this medication. This medication may increase your risk of getting an infection. Call your care team for advice if you get a fever, chills, sore throat, or other symptoms of a cold or flu. Do not treat yourself. Try to avoid being around people who are sick. Avoid taking medications that contain aspirin, acetaminophen, ibuprofen, naproxen, or ketoprofen unless instructed by your care team. These medications may hide a fever. Be careful brushing or flossing your teeth or using a toothpick because you may get an infection or bleed more easily. If you have any dental work done, tell your dentist you are receiving this medication. This medication can cause serious infusion reactions. To reduce the risk, your care team may give you other medications to take before receiving this one. Be sure to follow the directions from your care team. Use a condom during sex while taking this medication and for 4 months after the last dose. Talk to your care team right away if your partner may be pregnant. This medication can cause serious birth defects. This medication may cause infertility. Talk to your care team if you are concerned about your fertility. What side effects may I notice from receiving this medication? Side effects that you should report to your care team as soon as possible: Allergic reactions--skin rash, itching, hives, swelling of the face, lips, tongue, or throat Diarrhea, nausea, vomiting Dry cough, shortness of breath or  trouble breathing Infection--fever, chills, cough, or sore throat Kidney injury--decrease in the amount of urine, swelling of the ankles, hands, or feet Pain, tingling, or numbness in the hands or feet Red or dark brown urine Stomach  bleeding--bloody or black, tar-like stools, vomiting blood or brown material that looks like coffee grounds Stomach pain that is severe, does not away, or gets worse Unusual bruising or bleeding Side effects that usually do not require medical attention (report these to your care team if they continue or are bothersome): Loss of appetite Unusual weakness or fatigue This list may not describe all possible side effects. Call your doctor for medical advice about side effects. You may report side effects to FDA at 1-800-FDA-1088. Where should I keep my medication? This medication is given in a hospital or clinic. It will not be stored at home. NOTE: This sheet is a summary. It may not cover all possible information. If you have questions about this medicine, talk to your doctor, pharmacist, or health care provider.  2024 [employer] Standard (2022-03-23 00:00:00)

## 2023-01-13 ENCOUNTER — Inpatient Hospital Stay: Payer: No Typology Code available for payment source

## 2023-01-13 VITALS — BP 136/72 | HR 84 | Temp 98.4°F | Resp 18

## 2023-01-13 DIAGNOSIS — Z5111 Encounter for antineoplastic chemotherapy: Secondary | ICD-10-CM | POA: Diagnosis not present

## 2023-01-13 DIAGNOSIS — C61 Malignant neoplasm of prostate: Secondary | ICD-10-CM

## 2023-01-13 DIAGNOSIS — C7951 Secondary malignant neoplasm of bone: Secondary | ICD-10-CM

## 2023-01-13 MED ORDER — PEGFILGRASTIM-CBQV 6 MG/0.6ML ~~LOC~~ SOSY
6.0000 mg | PREFILLED_SYRINGE | Freq: Once | SUBCUTANEOUS | Status: AC
  Administered 2023-01-13: 6 mg via SUBCUTANEOUS
  Filled 2023-01-13: qty 0.6

## 2023-01-13 NOTE — Patient Instructions (Signed)

## 2023-01-14 ENCOUNTER — Other Ambulatory Visit: Payer: Self-pay

## 2023-01-17 ENCOUNTER — Encounter: Payer: Self-pay | Admitting: Oncology

## 2023-01-18 ENCOUNTER — Encounter: Payer: Self-pay | Admitting: Oncology

## 2023-01-24 ENCOUNTER — Other Ambulatory Visit: Payer: Self-pay

## 2023-01-24 MED ORDER — MORPHINE SULFATE ER 15 MG PO TBCR: 15.0000 mg | EXTENDED_RELEASE_TABLET | Freq: Two times a day (BID) | ORAL | 0 refills | Status: AC

## 2023-01-25 ENCOUNTER — Encounter: Payer: Self-pay | Admitting: Oncology

## 2023-01-26 ENCOUNTER — Encounter: Payer: Self-pay | Admitting: Oncology

## 2023-02-01 ENCOUNTER — Inpatient Hospital Stay: Payer: No Typology Code available for payment source

## 2023-02-01 ENCOUNTER — Encounter: Payer: Self-pay | Admitting: Hematology and Oncology

## 2023-02-01 ENCOUNTER — Encounter: Payer: Self-pay | Admitting: Oncology

## 2023-02-01 ENCOUNTER — Other Ambulatory Visit: Payer: Self-pay

## 2023-02-01 ENCOUNTER — Inpatient Hospital Stay: Payer: No Typology Code available for payment source | Attending: Oncology | Admitting: Hematology and Oncology

## 2023-02-01 ENCOUNTER — Telehealth: Payer: Self-pay | Admitting: Hematology and Oncology

## 2023-02-01 ENCOUNTER — Other Ambulatory Visit: Payer: No Typology Code available for payment source

## 2023-02-01 VITALS — BP 125/64 | HR 64 | Temp 98.1°F | Resp 18 | Ht 67.0 in | Wt 191.2 lb

## 2023-02-01 DIAGNOSIS — Z7952 Long term (current) use of systemic steroids: Secondary | ICD-10-CM | POA: Insufficient documentation

## 2023-02-01 DIAGNOSIS — R5383 Other fatigue: Secondary | ICD-10-CM | POA: Diagnosis not present

## 2023-02-01 DIAGNOSIS — C7951 Secondary malignant neoplasm of bone: Secondary | ICD-10-CM | POA: Insufficient documentation

## 2023-02-01 DIAGNOSIS — D649 Anemia, unspecified: Secondary | ICD-10-CM | POA: Diagnosis not present

## 2023-02-01 DIAGNOSIS — E785 Hyperlipidemia, unspecified: Secondary | ICD-10-CM | POA: Insufficient documentation

## 2023-02-01 DIAGNOSIS — M533 Sacrococcygeal disorders, not elsewhere classified: Secondary | ICD-10-CM | POA: Diagnosis not present

## 2023-02-01 DIAGNOSIS — I471 Supraventricular tachycardia, unspecified: Secondary | ICD-10-CM | POA: Diagnosis not present

## 2023-02-01 DIAGNOSIS — C61 Malignant neoplasm of prostate: Secondary | ICD-10-CM

## 2023-02-01 DIAGNOSIS — C7952 Secondary malignant neoplasm of bone marrow: Secondary | ICD-10-CM

## 2023-02-01 DIAGNOSIS — Z5111 Encounter for antineoplastic chemotherapy: Secondary | ICD-10-CM | POA: Diagnosis present

## 2023-02-01 DIAGNOSIS — C7800 Secondary malignant neoplasm of unspecified lung: Secondary | ICD-10-CM

## 2023-02-01 DIAGNOSIS — Z79899 Other long term (current) drug therapy: Secondary | ICD-10-CM | POA: Insufficient documentation

## 2023-02-01 DIAGNOSIS — Z8041 Family history of malignant neoplasm of ovary: Secondary | ICD-10-CM | POA: Diagnosis not present

## 2023-02-01 DIAGNOSIS — R110 Nausea: Secondary | ICD-10-CM | POA: Insufficient documentation

## 2023-02-01 DIAGNOSIS — Z7902 Long term (current) use of antithrombotics/antiplatelets: Secondary | ICD-10-CM | POA: Insufficient documentation

## 2023-02-01 DIAGNOSIS — Z803 Family history of malignant neoplasm of breast: Secondary | ICD-10-CM | POA: Diagnosis not present

## 2023-02-01 LAB — CMP (CANCER CENTER ONLY)
ALT: 18 U/L (ref 0–44)
AST: 25 U/L (ref 15–41)
Albumin: 4.3 g/dL (ref 3.5–5.0)
Alkaline Phosphatase: 78 U/L (ref 38–126)
Anion gap: 10 (ref 5–15)
BUN: 12 mg/dL (ref 8–23)
CO2: 24 mmol/L (ref 22–32)
Calcium: 9.4 mg/dL (ref 8.9–10.3)
Chloride: 105 mmol/L (ref 98–111)
Creatinine: 0.8 mg/dL (ref 0.61–1.24)
GFR, Estimated: >60 mL/min (ref >60)
Glucose, Bld: 104 mg/dL — ABNORMAL HIGH (ref 70–99)
Potassium: 3.7 mmol/L (ref 3.5–5.1)
Sodium: 140 mmol/L (ref 135–145)
Total Bilirubin: 0.3 mg/dL (ref <1.2)
Total Protein: 6.5 g/dL (ref 6.5–8.1)

## 2023-02-01 LAB — CBC WITH DIFFERENTIAL (CANCER CENTER ONLY)
Abs Immature Granulocytes: 0.08 10*3/uL — ABNORMAL HIGH (ref 0.00–0.07)
Basophils Absolute: 0.1 10*3/uL (ref 0.0–0.1)
Basophils Relative: 1 %
Eosinophils Absolute: 0 10*3/uL (ref 0.0–0.5)
Eosinophils Relative: 0 %
HCT: 35.5 % — ABNORMAL LOW (ref 39.0–52.0)
Hemoglobin: 11.2 g/dL — ABNORMAL LOW (ref 13.0–17.0)
Immature Granulocytes: 1 %
Lymphocytes Relative: 32 %
Lymphs Abs: 3 10*3/uL (ref 0.7–4.0)
MCH: 28.3 pg (ref 26.0–34.0)
MCHC: 31.5 g/dL (ref 30.0–36.0)
MCV: 89.6 fL (ref 80.0–100.0)
Monocytes Absolute: 0.8 10*3/uL (ref 0.1–1.0)
Monocytes Relative: 9 %
Neutro Abs: 5.4 10*3/uL (ref 1.7–7.7)
Neutrophils Relative %: 57 %
Platelet Count: 223 10*3/uL (ref 150–400)
RBC: 3.96 MIL/uL — ABNORMAL LOW (ref 4.22–5.81)
RDW: 18.1 % — ABNORMAL HIGH (ref 11.5–15.5)
WBC Count: 9.5 10*3/uL (ref 4.0–10.5)
nRBC: 0 /100{WBCs}
nRBC: 0.03 % (ref 0.0–0.2)

## 2023-02-01 MED ORDER — CABAZITAXEL CHEMO INJECTION 60 MG/6ML W/DILUENT
20.0000 mg/m2 | Freq: Once | INTRAVENOUS | Status: AC
  Administered 2023-02-01: 41 mg via INTRAVENOUS
  Filled 2023-02-01: qty 4.1

## 2023-02-01 MED ORDER — SODIUM CHLORIDE 0.9% FLUSH
10.0000 mL | INTRAVENOUS | Status: DC | PRN
  Administered 2023-02-01: 10 mL

## 2023-02-01 MED ORDER — SODIUM CHLORIDE 0.9 % IV SOLN: Freq: Once | INTRAVENOUS | Status: AC

## 2023-02-01 MED ORDER — HEPARIN SOD (PORK) LOCK FLUSH 100 UNIT/ML IV SOLN
500.0000 [IU] | Freq: Once | INTRAVENOUS | Status: AC | PRN
Start: 2023-02-01 — End: 2023-02-01
  Administered 2023-02-01: 500 [IU]

## 2023-02-01 MED ORDER — DIPHENHYDRAMINE HCL 50 MG/ML IJ SOLN
25.0000 mg | Freq: Once | INTRAMUSCULAR | Status: AC
  Administered 2023-02-01: 25 mg via INTRAVENOUS
  Filled 2023-02-01: qty 1

## 2023-02-01 MED ORDER — FAMOTIDINE IN NACL 20-0.9 MG/50ML-% IV SOLN
20.0000 mg | Freq: Once | INTRAVENOUS | Status: AC
  Administered 2023-02-01: 20 mg via INTRAVENOUS
  Filled 2023-02-01: qty 50

## 2023-02-01 MED ORDER — DEXAMETHASONE SODIUM PHOSPHATE 10 MG/ML IJ SOLN
10.0000 mg | Freq: Once | INTRAMUSCULAR | Status: AC
  Administered 2023-02-01: 10 mg via INTRAVENOUS
  Filled 2023-02-01: qty 1

## 2023-02-01 NOTE — Assessment & Plan Note (Signed)
He continues zoledronic acid every 3 months in addition to his cancer therapy. He had marked progression of his bone metastases in August 2023, but minimal symptoms. He received docetaxel chemotherapy with a decrease in the PSA. Docetaxel was discontinued in May 2024.  He then had a significant increase in his PSA and progression of the bone metastases.  PET scan revealed multiple new lesions of the spine compared to August 2023, including T12-L1 and L2, sacrum, multiple rib lesions, multiple scapular lesions and pelvic lesions.  Other lesions so increased in size.  He has widespread skeletal metastases, but no evidence of nodal or visceral metastasis.  He continues zoledronic acid and leuprolide every 3 months.  He is now receiving chemotherapy with cabazitaxel/prednisone.

## 2023-02-01 NOTE — Assessment & Plan Note (Signed)
Stage IVA prostate cancer diagnosed in January 2016.  He had progressive disease in the bone and lung in September 2023, so was placed on docetaxel chemotherapy and continued leuprolide and zoledronic acid.  His PSA went down from 536 to 400.75 after 3 cycles, then to 284 with the 5th cycle, and then 203 in March 2024. PSA then went up to 261 in May. We decided to give him a break from chemotherapy at that time.  We continued leuprolide and zolendronic acid every 3 months. The PSA was 1156. PSMA PET in September revealed progression of skeletal metastasis. Multiple new radiotracer avid lesions within the spine. Individual lesions are increased in size compared to prior accompanied by sclerotic CT change. 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 No evidence of nodal metastasis or visceral metastasis.   He is receiving palliative chemotherapy with cabazitaxel/prednisone. His PSA was up to 1608 prior to starting was down to 1317 prior to a 2nd cycle.  He continues to tolerate treatment fairly well. PSA is pending from today.  He will proceed with a 3rd cycle next week. We will plan to see him back in 3 weeks prior to a 4th cycle of cabazitaxel/prednisone.

## 2023-02-01 NOTE — Patient Instructions (Signed)
Cabazitaxel Injection What is this medication? CABAZITAXEL (ka BAZ i TAX el) treats prostate cancer. It works by slowing down the growth of cancer cells. This medicine may be used for other purposes; ask your health care provider or pharmacist if you have questions. COMMON BRAND NAME(S): Jevtana What should I tell my care team before I take this medication? They need to know if you have any of these conditions: Kidney problems Liver disease Low white blood cell levels Lung disease Stomach or intestine problems An unusual or allergic reaction to cabazitaxel, polysorbate 80, other medications, foods, dyes, or preservatives Pregnant or trying to get pregnant Breast-feeding How should I use this medication? This medication is injected into a vein. It is given by your care team in a hospital or clinic setting. Talk to your care team about the use of this medication in children. Special care may be needed. Overdosage: If you think you have taken too much of this medicine contact a poison control center or emergency room at once. NOTE: This medicine is only for you. Do not share this medicine with others. What if I miss a dose? Keep appointments for follow-up doses. It is important not to miss your dose. Call your care team if you are unable to keep an appointment. What may interact with this medication? Certain antibiotics, such as clarithromycin or telithromycin Certain antivirals for HIV or AIDS Certain medications for fungal infections like ketoconazole, itraconazole, and voriconazole Nefazodone This list may not describe all possible interactions. Give your health care provider a list of all the medicines, herbs, non-prescription drugs, or dietary supplements you use. Also tell them if you smoke, drink alcohol, or use illegal drugs. Some items may interact with your medicine. What should I watch for while using this medication? This medication may make you feel generally unwell. This is  not uncommon as chemotherapy can affect healthy cells as well as cancer cells. Report any side effects. Continue your course of treatment even though you feel ill unless your care team tells you to stop. You may need blood work while you are taking this medication. This medication may increase your risk of getting an infection. Call your care team for advice if you get a fever, chills, sore throat, or other symptoms of a cold or flu. Do not treat yourself. Try to avoid being around people who are sick. Avoid taking medications that contain aspirin, acetaminophen, ibuprofen, naproxen, or ketoprofen unless instructed by your care team. These medications may hide a fever. Be careful brushing or flossing your teeth or using a toothpick because you may get an infection or bleed more easily. If you have any dental work done, tell your dentist you are receiving this medication. This medication can cause serious infusion reactions. To reduce the risk, your care team may give you other medications to take before receiving this one. Be sure to follow the directions from your care team. Use a condom during sex while taking this medication and for 4 months after the last dose. Talk to your care team right away if your partner may be pregnant. This medication can cause serious birth defects. This medication may cause infertility. Talk to your care team if you are concerned about your fertility. What side effects may I notice from receiving this medication? Side effects that you should report to your care team as soon as possible: Allergic reactions--skin rash, itching, hives, swelling of the face, lips, tongue, or throat Diarrhea, nausea, vomiting Dry cough, shortness of breath or  trouble breathing Infection--fever, chills, cough, or sore throat Kidney injury--decrease in the amount of urine, swelling of the ankles, hands, or feet Pain, tingling, or numbness in the hands or feet Red or dark brown urine Stomach  bleeding--bloody or black, tar-like stools, vomiting blood or brown material that looks like coffee grounds Stomach pain that is severe, does not away, or gets worse Unusual bruising or bleeding Side effects that usually do not require medical attention (report these to your care team if they continue or are bothersome): Loss of appetite Unusual weakness or fatigue This list may not describe all possible side effects. Call your doctor for medical advice about side effects. You may report side effects to FDA at 1-800-FDA-1088. Where should I keep my medication? This medication is given in a hospital or clinic. It will not be stored at home. NOTE: This sheet is a summary. It may not cover all possible information. If you have questions about this medicine, talk to your doctor, pharmacist, or health care provider.  2024 [employer] Standard (2022-03-23 00:00:00)

## 2023-02-01 NOTE — Assessment & Plan Note (Addendum)
He had an upper GI hemorrhage earlier this year and was referred to Dr. [name]. Fortunately, he just had the one episode and was transfused. His hemoglobin came up to 11.1 and his hemoglobin remained stable at 11.3. He had a EGD scheduled in April, but it was canceled. His cardiologist Dr. Allyson Sabal was not comfortable with him stopping clopidogrel for another year. His bleeding has stopped and so we will not pursue an EGD at this time unless he begins having melena again and/or a significant drop in his hemoglobin.  His hemoglobin is fairly has decreased to 11.2 from 12.2. He denies obvious blood loss, but could have chronic mild GI losses. Chemotherapy and bone metastasis can be contributing. We will continue to monitor this.

## 2023-02-01 NOTE — Telephone Encounter (Signed)
Patient has been scheduled for follow-up visit per 02/01/23 LOS.  Pt given an appt calendar with date and time.

## 2023-02-01 NOTE — Progress Notes (Signed)
[facility]  [address] [place],  [place]  [address] [phone]  Clinic Day:  02/01/2023  Referring physician: [name], MD  ASSESSMENT & PLAN:   Assessment & Plan: Malignant neoplasm of prostate [facility]) Stage IVA prostate cancer diagnosed in January 2016.  He had progressive disease in the bone and lung in September 2023, so was placed on docetaxel chemotherapy and continued leuprolide and zoledronic acid.  His PSA went down from 536 to 400.75 after 3 cycles, then to 284 with the 5th cycle, and then 203 in March 2024. PSA then went up to 261 in May. We decided to give him a break from chemotherapy at that time.  We continued leuprolide and zolendronic acid every 3 months. The PSA was 1156. PSMA PET in September revealed progression of skeletal metastasis. Multiple new radiotracer avid lesions within the spine. Individual lesions are increased in size compared to prior accompanied by sclerotic CT change. 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 No evidence of nodal metastasis or visceral metastasis.   He is receiving palliative chemotherapy with cabazitaxel/prednisone. His PSA was up to 1608 prior to starting was down to 1317 prior to a 2nd cycle.  He continues to tolerate treatment fairly well. PSA is pending from today.  He will proceed with a 3rd cycle next week. We will plan to see him back in 3 weeks prior to a 4th cycle of cabazitaxel/prednisone.  Secondary malignant neoplasm of bone and bone marrow (HCC) He continues zoledronic acid every 3 months in addition to his cancer therapy. He had marked progression of his bone metastases in August 2023, but minimal symptoms. He received docetaxel chemotherapy with a decrease in the PSA. Docetaxel was discontinued in May 2024.  He then had a significant increase in his PSA and progression of the bone metastases.  PET scan revealed multiple new lesions of the spine compared to August 2023,  including T12-L1 and L2, sacrum, multiple rib lesions, multiple scapular lesions and pelvic lesions.  Other lesions so increased in size.  He has widespread skeletal metastases, but no evidence of nodal or visceral metastasis.  He continues zoledronic acid and leuprolide every 3 months.  He is now receiving chemotherapy with cabazitaxel/prednisone.  Anemia He had an upper GI hemorrhage earlier this year and was referred to Dr. Jennye Johnson. Fortunately, he just had the one episode and was transfused. His hemoglobin came up to 11.1 and his hemoglobin remained stable at 11.3. He had a EGD scheduled in April, but it was canceled. His cardiologist Dr. Allyson Johnson was not comfortable with him stopping clopidogrel for another year. His bleeding has stopped and so we will not pursue an EGD at this time unless he begins having melena again and/or a significant drop in his hemoglobin.  His hemoglobin is fairly has decreased to 11.2 from 12.2. He denies obvious blood loss, but could have chronic mild GI losses. Chemotherapy and bone metastasis can be contributing. We will continue to monitor this.    The patient understands the plans discussed today and is in agreement with them.  He knows to contact our office if he develops concerns prior to his next appointment.   I provided 30 minutes of face-to-face time during this encounter and > 50% was spent counseling as documented under my assessment and plan.    Marvin Perl, PA-C  [facility] - [facility] DE[facility] OF [facility] [address] [place] [address]  Dept: [phone] Dept Fax: [phone]   Orders Placed This Encounter  Procedures   Prostate-Specific AG, Serum    Standing Status:   Future    Number of Occurrences:   1    Standing Expiration Date:   02/01/2024      CHIEF COMPLAINT:  CC: Metastatic prostate cancer  Current Treatment:  Cabazitaxel every 3 weeks with leuprolide and zoledronic  acid every 3 months  HISTORY OF PRESENT ILLNESS:   Oncology History  Malignant neoplasm of prostate (HCC)  03/30/2014 Cancer Staging   Staging form: Prostate, AJCC 8th Edition - Clinical stage from 03/30/2014: Stage IVA (cT3b, cN1, cM0, PSA: 51.8, Grade Group: 4) - Signed by [name], MD on 11/14/2020 Histopathologic type: Adenocarcinoma, NOS Stage prefix: Initial diagnosis Prostate specific antigen (PSA) range: 20 or greater Gleason primary pattern: 4 Gleason secondary pattern: 4 Gleason score: 8 Histologic grading system: 5 grade system Location of positive needle core biopsies: Beyond primary site Prognostic indicators: May 2016 Robotic prostatectomy with mult. Pos margins, extra prostatic extension, 2/6 pos nodes. Scan June 2016 positive at T10  PSA up to 216.3 by August Placed on lupron and casodex Stage used in treatment planning: Yes National guidelines used in treatment planning: Yes Type of national guideline used in treatment planning: NCCN   02/18/2020 Initial Diagnosis   Malignant neoplasm of prostate (HCC)   12/01/2021 - 07/19/2022 Chemotherapy   Patient is on Treatment Plan : PROSTATE Docetaxel (75) + Prednisone q21d     12/21/2022 -  Chemotherapy   Patient is on Treatment Plan : PROSTATE Cabazitaxel (20) D1 + Prednisone D1-21 q21d     Secondary malignant neoplasm of bone and bone marrow (HCC)  02/18/2020 Initial Diagnosis   Secondary malignant neoplasm of bone and bone marrow (HCC)   12/01/2021 - 07/19/2022 Chemotherapy   Patient is on Treatment Plan : PROSTATE Docetaxel (75) + Prednisone q21d     12/21/2022 -  Chemotherapy   Patient is on Treatment Plan : PROSTATE Cabazitaxel (20) D1 + Prednisone D1-21 q21d     Malignant neoplasm of prostate metastatic to lung (HCC)  11/25/2021 Initial Diagnosis   Malignant neoplasm of prostate metastatic to lung (HCC)   12/21/2022 -  Chemotherapy   Patient is on Treatment Plan : PROSTATE Cabazitaxel (20) D1 + Prednisone  D1-21 q21d         INTERVAL HISTORY:  Marvin Johnson is here today for repeat clinical assessment prior to a 3rd cycle of cabazitaxel. He states he tolerated his 2nd cycle well. He had increased fatigue for several days, as well as intermittent nausea. He reports occasional unsteadiness on his feet. He has occasional mild headaches. He denies fevers or chills. He denies pain. His appetite is good. His weight has been stable.  REVIEW OF SYSTEMS:  Review of Systems  Constitutional:  Positive for fatigue. Negative for appetite change, chills, fever and unexpected weight change.  HENT:   Negative for lump/mass, mouth sores and sore throat.   Respiratory:  Negative for cough and shortness of breath.   Cardiovascular:  Negative for chest pain and leg swelling.  Gastrointestinal:  Positive for nausea. Negative for abdominal pain, blood in stool, constipation, diarrhea and vomiting.  Genitourinary:  Negative for difficulty urinating, dysuria, frequency and hematuria.   Musculoskeletal:  Positive for back pain and gait problem (occasional unsteadiness). Negative for arthralgias and myalgias.  Skin:  Negative for itching and rash.  Neurological:  Positive for gait problem (occasional unsteadiness) and headaches (mild headaches, not  increased in frequency or severity). Negative for dizziness, extremity weakness, light-headedness and numbness.  Hematological:  Negative for adenopathy. Does not bruise/bleed easily.  Psychiatric/Behavioral:  Negative for depression and sleep disturbance. The patient is not nervous/anxious.      VITALS:  Blood pressure 125/64, pulse 64, temperature 98.1 F (36.7 C), temperature source Oral, resp. rate 18, height 5\' 7"  (1.702 m), weight 191 lb 3.2 oz (86.7 kg), SpO2 95%.  Wt Readings from Last 3 Encounters:  02/01/23 191 lb 3.2 oz (86.7 kg)  01/11/23 195 lb (88.5 kg)  01/06/23 190 lb 12.8 oz (86.5 kg)    Body mass index is 29.95 kg/m.  Performance status (ECOG): 1 -  Symptomatic but completely ambulatory  PHYSICAL EXAM:  Physical Exam Vitals and nursing note reviewed.  Constitutional:      General: He is not in acute distress.    Appearance: Normal appearance. He is normal weight.  HENT:     Head: Normocephalic and atraumatic.     Mouth/Throat:     Mouth: Mucous membranes are moist.     Pharynx: Oropharynx is clear. No oropharyngeal exudate or posterior oropharyngeal erythema.  Eyes:     General: No scleral icterus.    Extraocular Movements: Extraocular movements intact.     Conjunctiva/sclera: Conjunctivae normal.     Pupils: Pupils are equal, round, and reactive to light.  Cardiovascular:     Rate and Rhythm: Normal rate and regular rhythm.     Heart sounds: Normal heart sounds. No murmur heard.    No friction rub. No gallop.  Pulmonary:     Effort: Pulmonary effort is normal.     Breath sounds: Normal breath sounds. No wheezing, rhonchi or rales.  Abdominal:     General: Bowel sounds are normal. There is no distension.     Palpations: Abdomen is soft. There is no hepatomegaly, splenomegaly or mass.     Tenderness: There is no abdominal tenderness.  Musculoskeletal:        General: Normal range of motion.     Cervical back: Normal range of motion and neck supple. No tenderness.     Right lower leg: No edema.     Left lower leg: No edema.  Lymphadenopathy:     Cervical: No cervical adenopathy.     Upper Body:     Right upper body: No supraclavicular or axillary adenopathy.     Left upper body: No supraclavicular or axillary adenopathy.     Lower Body: No right inguinal adenopathy. No left inguinal adenopathy.  Skin:    General: Skin is warm and dry.     Coloration: Skin is not jaundiced.     Findings: No rash.  Neurological:     Mental Status: He is alert and oriented to person, place, and time.     Cranial Nerves: No cranial nerve deficit.  Psychiatric:        Mood and Affect: Mood normal.        Behavior: Behavior normal.         Thought Content: Thought content normal.    LABS:      Latest Ref Rng & Units 02/01/2023    8:23 AM 01/06/2023   12:00 AM 12/31/2022   12:00 AM  CBC  WBC 4.0 - 10.5 K/uL 9.5  27.6     33.4      Hemoglobin 13.0 - 17.0 g/dL 16.1  09.6     04.5      Hematocrit 39.0 - 52.0 %  35.5  38     37      Platelets 150 - 400 K/uL 223  174     273         This result is from an external source.      Latest Ref Rng & Units 02/01/2023    8:23 AM 01/06/2023   12:00 AM 12/31/2022   12:00 AM  CMP  Glucose 70 - 99 mg/dL 952     BUN 8 - 23 mg/dL 12  15     12       Creatinine 0.61 - 1.24 mg/dL 8.41  0.7     0.8      Sodium 135 - 145 mmol/L 140  137     140      Potassium 3.5 - 5.1 mmol/L 3.7  3.9     3.9      Chloride 98 - 111 mmol/L 105  102     102      CO2 22 - 32 mmol/L 24  31     31       Calcium 8.9 - 10.3 mg/dL 9.4  9.0     32.4      Total Protein 6.5 - 8.1 g/dL 6.5     Total Bilirubin <1.2 mg/dL 0.3     Alkaline Phos 38 - 126 U/L 78  107     137      AST 15 - 41 U/L 25  35     50      ALT 0 - 44 U/L 18  28     27          This result is from an external source.     Lab Results  Component Value Date   PSA1 1,317.0 (H) 01/06/2023    Lab Results  Component Value Date   TIBC 398 01/29/2022   FERRITIN 206 01/29/2022   IRONPCTSAT 25 01/29/2022   No results found for: "LDH"  STUDIES:  No results found.    HISTORY:   Past Medical History:  Diagnosis Date   Hyperlipidemia    Malignant neoplasm of prostate (HCC)    Supraventricular tachycardia (HCC)     Past Surgical History:  Procedure Laterality Date   CORONARY STENT INTERVENTION N/A 02/08/2022   Procedure: CORONARY STENT INTERVENTION;  Surgeon: Yvonne Kendall, MD;  Location: [facility] INVASIVE CV [facility];  Service: Cardiovascular;  Laterality: N/A;   LEFT HEART CATH AND CORONARY ANGIOGRAPHY N/A 02/08/2022   Procedure: LEFT HEART CATH AND CORONARY ANGIOGRAPHY;  Surgeon: Yvonne Kendall, MD;  Location: [facility] INVASIVE CV LAB;   Service: Cardiovascular;  Laterality: N/A;   PROSTATECTOMY  07/2014   TONSILLECTOMY      Family History  Problem Relation Age of Onset   Breast cancer Mother 10   Ovarian cancer Mother 17   Breast cancer Paternal Grandmother 9    Social History:  reports that he has never smoked. He has never used smokeless tobacco. He reports that he does not currently use alcohol. He reports that he does not use drugs.The patient is alone today.  Allergies:  Allergies  Allergen Reactions   Atorvastatin    Rosuvastatin Other (See Comments)    Other reaction(s): Muscle pain   Simvastatin Other (See Comments)    Other reaction(s): Joint pain, Muscle pain   Sulfa Antibiotics    Sulfamethoxazole-Trimethoprim Other (See Comments)    Unknown as to interaction was a baby.    Current Medications: Current Outpatient Medications  Medication  Sig Dispense Refill   Alirocumab (PRALUENT [place]) Inject into the skin. One injection every 2 weeks Monday     APPLE CIDER VINEGAR PO Take by mouth. (Patient not taking: Reported on 01/06/2023)     Cholecalciferol 25 MCG (1000 UT) tablet Take 1,000 Units by mouth in the morning and at bedtime.     clopidogrel (PLAVIX) 75 MG tablet Take 75 mg by mouth daily.     diphenhydrAMINE (BENADRYL) 25 mg capsule Take 1 capsule by mouth at bedtime.     diphenhydrAMINE (BENADRYL) 50 MG capsule Take 50 mg by mouth at bedtime as needed.     ezetimibe (ZETIA) 10 MG tablet Take 10 mg by mouth daily.     fluconazole (DIFLUCAN) 100 MG tablet Take 1 tablet (100 mg total) by mouth daily. (Patient not taking: Reported on 01/06/2023) 10 tablet 0   isosorbide mononitrate (IMDUR) 30 MG 24 hr tablet Take 1 tablet by mouth every 8 (eight) hours as needed.     leuprolide (LUPRON) 22.5 MG injection Inject 22.5 mg into the muscle every 3 (three) months.     metoprolol tartrate (LOPRESSOR) 25 MG tablet Take 12.5 mg by mouth 2 (two) times daily.     morphine (MS CONTIN) 15 MG 12 hr tablet Take 1  tablet (15 mg total) by mouth every 12 (twelve) hours. 60 tablet 0   Multiple Vitamins-Minerals (MULTIVITAMIN WITH MINERALS) tablet Take 1 tablet by mouth daily.     nitroGLYCERIN (NITROSTAT) 0.4 MG SL tablet Place 0.4 mg under the tongue every 5 (five) minutes as needed for chest pain. Repeat every 5 minutes if needed for a total of 3 tablets in 15 minutes. If no relief, CALL 911.     Omega-3 Fatty Acids (FISH OIL) 1000 MG CAPS Take 2,000 mg by mouth in the morning and at bedtime.     ondansetron (ZOFRAN) 8 MG tablet Take 8 mg by mouth every 8 (eight) hours as needed for nausea or vomiting.     polyethylene glycol powder (GLYCOLAX/MIRALAX) 17 GM/SCOOP powder Take 1 Container by mouth once.     predniSONE (DELTASONE) 5 MG tablet Take 1 tablet (5 mg total) by mouth 2 (two) times daily with a meal. 60 tablet 5   prochlorperazine (COMPAZINE) 10 MG tablet Take 10 mg by mouth every 6 (six) hours as needed for nausea or vomiting.     Royal Jelly-Bee Pollen-Ginseng (KOREAN GINSENG COMPLEX) 150-250-50 MG CAPS Take 1 capsule by mouth daily.     triamcinolone (KENALOG) 0.025 % cream Apply 1 Application topically 2 (two) times daily. 30 g 0   Zoledronic Acid (ZOMETA) 4 MG/100ML IVPB Inject 4 mg into the vein every 3 (three) months.     No current facility-administered medications for this visit.

## 2023-02-02 ENCOUNTER — Other Ambulatory Visit: Payer: Self-pay

## 2023-02-02 LAB — PROSTATE-SPECIFIC AG, SERUM (LABCORP): Prostate Specific Ag, Serum: 1362 ng/mL — ABNORMAL HIGH (ref 0.0–4.0)

## 2023-02-03 ENCOUNTER — Inpatient Hospital Stay: Payer: No Typology Code available for payment source

## 2023-02-03 VITALS — BP 133/64 | HR 59 | Temp 98.4°F | Resp 18 | Ht 67.0 in | Wt 191.0 lb

## 2023-02-03 DIAGNOSIS — C7800 Secondary malignant neoplasm of unspecified lung: Secondary | ICD-10-CM

## 2023-02-03 DIAGNOSIS — C7951 Secondary malignant neoplasm of bone: Secondary | ICD-10-CM

## 2023-02-03 DIAGNOSIS — Z5111 Encounter for antineoplastic chemotherapy: Secondary | ICD-10-CM | POA: Diagnosis not present

## 2023-02-03 DIAGNOSIS — C61 Malignant neoplasm of prostate: Secondary | ICD-10-CM

## 2023-02-03 MED ORDER — PEGFILGRASTIM-FPGK 6 MG/0.6ML ~~LOC~~ SOSY
6.0000 mg | PREFILLED_SYRINGE | Freq: Once | SUBCUTANEOUS | Status: AC
Start: 2023-02-03 — End: 2023-02-03
  Administered 2023-02-03: 6 mg via SUBCUTANEOUS
  Filled 2023-02-03: qty 0.6

## 2023-02-03 NOTE — Patient Instructions (Signed)

## 2023-02-04 ENCOUNTER — Telehealth: Payer: Self-pay

## 2023-02-04 NOTE — Telephone Encounter (Signed)
Patient notified and voiced understanding.

## 2023-02-04 NOTE — Telephone Encounter (Signed)
-----   Message from Adah Perl sent at 02/04/2023  3:28 PM EST ----- Please let him know his PSA went up just slightly, by 50 points. Thanks

## 2023-02-22 ENCOUNTER — Encounter: Payer: Self-pay | Admitting: Oncology

## 2023-02-23 ENCOUNTER — Telehealth: Payer: Self-pay | Admitting: Hematology and Oncology

## 2023-02-23 ENCOUNTER — Encounter: Payer: Self-pay | Admitting: Hematology and Oncology

## 2023-02-23 ENCOUNTER — Telehealth: Payer: Self-pay

## 2023-02-23 ENCOUNTER — Inpatient Hospital Stay: Payer: No Typology Code available for payment source | Attending: Hematology and Oncology

## 2023-02-23 ENCOUNTER — Encounter: Payer: Self-pay | Admitting: Oncology

## 2023-02-23 ENCOUNTER — Inpatient Hospital Stay: Payer: No Typology Code available for payment source

## 2023-02-23 ENCOUNTER — Inpatient Hospital Stay (HOSPITAL_BASED_OUTPATIENT_CLINIC_OR_DEPARTMENT_OTHER): Payer: No Typology Code available for payment source | Admitting: Hematology and Oncology

## 2023-02-23 VITALS — BP 105/65 | HR 59 | Temp 98.2°F | Resp 18 | Ht 67.0 in | Wt 188.5 lb

## 2023-02-23 DIAGNOSIS — E785 Hyperlipidemia, unspecified: Secondary | ICD-10-CM | POA: Insufficient documentation

## 2023-02-23 DIAGNOSIS — Z7952 Long term (current) use of systemic steroids: Secondary | ICD-10-CM | POA: Insufficient documentation

## 2023-02-23 DIAGNOSIS — C61 Malignant neoplasm of prostate: Secondary | ICD-10-CM | POA: Insufficient documentation

## 2023-02-23 DIAGNOSIS — Z79899 Other long term (current) drug therapy: Secondary | ICD-10-CM | POA: Insufficient documentation

## 2023-02-23 DIAGNOSIS — C7951 Secondary malignant neoplasm of bone: Secondary | ICD-10-CM | POA: Diagnosis not present

## 2023-02-23 DIAGNOSIS — C7800 Secondary malignant neoplasm of unspecified lung: Secondary | ICD-10-CM | POA: Diagnosis not present

## 2023-02-23 DIAGNOSIS — C7952 Secondary malignant neoplasm of bone marrow: Secondary | ICD-10-CM | POA: Diagnosis not present

## 2023-02-23 DIAGNOSIS — Z5111 Encounter for antineoplastic chemotherapy: Secondary | ICD-10-CM | POA: Insufficient documentation

## 2023-02-23 DIAGNOSIS — D649 Anemia, unspecified: Secondary | ICD-10-CM | POA: Insufficient documentation

## 2023-02-23 LAB — CMP (CANCER CENTER ONLY)
ALT: 18 U/L (ref 0–44)
AST: 26 U/L (ref 15–41)
Albumin: 4.2 g/dL (ref 3.5–5.0)
Alkaline Phosphatase: 81 U/L (ref 38–126)
Anion gap: 12 (ref 5–15)
BUN: 14 mg/dL (ref 8–23)
CO2: 25 mmol/L (ref 22–32)
Calcium: 9.3 mg/dL (ref 8.9–10.3)
Chloride: 104 mmol/L (ref 98–111)
Creatinine: 0.87 mg/dL (ref 0.61–1.24)
GFR, Estimated: >60 mL/min (ref >60)
Glucose, Bld: 130 mg/dL — ABNORMAL HIGH (ref 70–99)
Potassium: 3.7 mmol/L (ref 3.5–5.1)
Sodium: 141 mmol/L (ref 135–145)
Total Bilirubin: 0.4 mg/dL (ref <1.2)
Total Protein: 6.5 g/dL (ref 6.5–8.1)

## 2023-02-23 LAB — CBC WITH DIFFERENTIAL (CANCER CENTER ONLY)
Abs Immature Granulocytes: 0.05 10*3/uL (ref 0.00–0.07)
Basophils Absolute: 0.1 10*3/uL (ref 0.0–0.1)
Basophils Relative: 1 %
Eosinophils Absolute: 0 10*3/uL (ref 0.0–0.5)
Eosinophils Relative: 0 %
HCT: 35.3 % — ABNORMAL LOW (ref 39.0–52.0)
Hemoglobin: 11.3 g/dL — ABNORMAL LOW (ref 13.0–17.0)
Immature Granulocytes: 1 %
Lymphocytes Relative: 33 %
Lymphs Abs: 3.4 10*3/uL (ref 0.7–4.0)
MCH: 29.5 pg (ref 26.0–34.0)
MCHC: 32 g/dL (ref 30.0–36.0)
MCV: 92.2 fL (ref 80.0–100.0)
Monocytes Absolute: 0.8 10*3/uL (ref 0.1–1.0)
Monocytes Relative: 8 %
Neutro Abs: 5.9 10*3/uL (ref 1.7–7.7)
Neutrophils Relative %: 57 %
Platelet Count: 220 10*3/uL (ref 150–400)
RBC: 3.83 MIL/uL — ABNORMAL LOW (ref 4.22–5.81)
RDW: 18 % — ABNORMAL HIGH (ref 11.5–15.5)
WBC Count: 10.2 10*3/uL (ref 4.0–10.5)
nRBC: 0 % (ref 0.0–0.2)
nRBC: 0 /100{WBCs}

## 2023-02-23 LAB — PSA: Prostatic Specific Antigen: 843.77 ng/mL — ABNORMAL HIGH (ref 0.00–4.00)

## 2023-02-23 MED ORDER — DIPHENHYDRAMINE HCL 50 MG/ML IJ SOLN
25.0000 mg | Freq: Once | INTRAMUSCULAR | Status: AC
  Administered 2023-02-23: 25 mg via INTRAVENOUS
  Filled 2023-02-23: qty 1

## 2023-02-23 MED ORDER — FAMOTIDINE IN NACL 20-0.9 MG/50ML-% IV SOLN
20.0000 mg | Freq: Once | INTRAVENOUS | Status: AC
  Administered 2023-02-23: 20 mg via INTRAVENOUS
  Filled 2023-02-23: qty 50

## 2023-02-23 MED ORDER — SODIUM CHLORIDE 0.9% FLUSH
10.0000 mL | INTRAVENOUS | Status: DC | PRN
  Administered 2023-02-23: 10 mL

## 2023-02-23 MED ORDER — ZOLEDRONIC ACID 4 MG/100ML IV SOLN
4.0000 mg | Freq: Once | INTRAVENOUS | Status: AC
  Administered 2023-02-23: 4 mg via INTRAVENOUS
  Filled 2023-02-23: qty 100

## 2023-02-23 MED ORDER — SODIUM CHLORIDE 0.9 % IV SOLN
Freq: Once | INTRAVENOUS | Status: AC
Start: 2023-02-23 — End: 2023-02-23

## 2023-02-23 MED ORDER — DEXAMETHASONE SODIUM PHOSPHATE 10 MG/ML IJ SOLN
10.0000 mg | Freq: Once | INTRAMUSCULAR | Status: AC
Start: 2023-02-23 — End: 2023-02-23
  Administered 2023-02-23: 10 mg via INTRAVENOUS
  Filled 2023-02-23: qty 1

## 2023-02-23 MED ORDER — HEPARIN SOD (PORK) LOCK FLUSH 100 UNIT/ML IV SOLN
500.0000 [IU] | Freq: Once | INTRAVENOUS | Status: AC | PRN
  Administered 2023-02-23: 500 [IU]

## 2023-02-23 MED ORDER — LEUPROLIDE ACETATE (3 MONTH) 22.5 MG IM KIT
22.5000 mg | PACK | Freq: Once | INTRAMUSCULAR | Status: AC
Start: 2023-02-23 — End: 2023-02-23
  Administered 2023-02-23: 22.5 mg via INTRAMUSCULAR
  Filled 2023-02-23: qty 22.5

## 2023-02-23 MED ORDER — CABAZITAXEL CHEMO INJECTION 60 MG/6ML W/DILUENT
20.0000 mg/m2 | Freq: Once | INTRAVENOUS | Status: AC
  Administered 2023-02-23: 41 mg via INTRAVENOUS
  Filled 2023-02-23: qty 4.1

## 2023-02-23 NOTE — Assessment & Plan Note (Signed)
He continues zoledronic acid every 3 months in addition to his cancer therapy. He had marked progression of his bone metastases in August 2023, but minimal symptoms. He received docetaxel chemotherapy with a decrease in the PSA. Docetaxel was discontinued in May 2024.  He then had a significant increase in his PSA and progression of the bone metastases.  PET scan revealed multiple new lesions of the spine compared to August 2023, including T12-L1 and L2, sacrum, multiple rib lesions, multiple scapular lesions and pelvic lesions.  Other lesions so increased in size.  He has widespread skeletal metastases, but no evidence of nodal or visceral metastasis.  He continues zoledronic acid and leuprolide every 3 months and he will proceed with those today.  He is also undergoing chemotherapy with cabazitaxel/prednisone.

## 2023-02-23 NOTE — Telephone Encounter (Signed)
-----   Message from Adah Perl sent at 02/23/2023  2:32 PM EST ----- Please let him know his PSA is down to 843 from 1362, thanks

## 2023-02-23 NOTE — Telephone Encounter (Signed)
Patient has been scheduled for follow-up visit per 02/23/23 LOS.  Pt given an appt calendar with date and time.

## 2023-02-23 NOTE — Assessment & Plan Note (Signed)
Stage IVA prostate cancer diagnosed in January 2016.  He had progressive disease in the bone and lung in September 2023, so was placed on docetaxel chemotherapy and continued leuprolide and zoledronic acid.  His PSA went down from 536 to 400.75 after 3 cycles, then to 284 with the 5th cycle, and then 203 in March 2024. PSA then went up to 261 in May. We decided to give him a break from chemotherapy at that time.  We continued leuprolide and zolendronic acid every 3 months. The PSA was 1156. PSMA PET in September revealed progression of skeletal metastasis. Multiple new radiotracer avid lesions within the spine. Individual lesions are increased in size compared to prior accompanied by sclerotic CT change. 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 No evidence of nodal metastasis or visceral metastasis.   He is receiving palliative chemotherapy with cabazitaxel/prednisone. His PSA was up to 1608 prior to starting was down to 1317 prior to a 2nd cycle. PSA is pending from today. He continues to tolerate treatment fairly well. PSA is pending from today.  He will proceed with a 4th cycle next week. We will plan to see him back in 3 weeks prior to a 5th cycle of cabazitaxel/prednisone.

## 2023-02-23 NOTE — Assessment & Plan Note (Signed)
He had an upper GI hemorrhage earlier this year and was referred to Dr. [name]. Fortunately, he just had the one episode and was transfused. His hemoglobin came up to 11.1 and his hemoglobin remained stable at 11.3. He had a EGD scheduled in April, but it was canceled. His cardiologist Dr. Allyson Sabal was not comfortable with him stopping clopidogrel for another year. His bleeding has stopped and so we will not pursue an EGD at this time unless he begins having melena again and/or a significant drop in his hemoglobin.  His hemoglobin is stable. He denies obvious blood loss, but could have chronic mild GI losses. Chemotherapy and bone metastasis can be contributing. We will continue to monitor this.

## 2023-02-23 NOTE — Progress Notes (Signed)
[facility]  [address] [place],  [place]  [address] [phone]  Clinic Day:  02/23/2023  Referring physician: [name], MD  ASSESSMENT & PLAN:   Assessment & Plan: Malignant neoplasm of prostate [facility]) Stage IVA prostate cancer diagnosed in January 2016.  He had progressive disease in the bone and lung in September 2023, so was placed on docetaxel chemotherapy and continued leuprolide and zoledronic acid.  His PSA went down from 536 to 400.75 after 3 cycles, then to 284 with the 5th cycle, and then 203 in March 2024. PSA then went up to 261 in May. We decided to give him a break from chemotherapy at that time.  We continued leuprolide and zolendronic acid every 3 months. The PSA was 1156. PSMA PET in September revealed progression of skeletal metastasis. Multiple new radiotracer avid lesions within the spine. Individual lesions are increased in size compared to prior accompanied by sclerotic CT change. 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 No evidence of nodal metastasis or visceral metastasis.   He is receiving palliative chemotherapy with cabazitaxel/prednisone. His PSA was up to 1608 prior to starting was down to 1317 prior to a 2nd cycle. PSA is pending from today. He continues to tolerate treatment fairly well. PSA is pending from today.  He will proceed with a 4th cycle next week. We will plan to see him back in 3 weeks prior to a 5th cycle of cabazitaxel/prednisone.  Secondary malignant neoplasm of bone and bone marrow (HCC) He continues zoledronic acid every 3 months in addition to his cancer therapy. He had marked progression of his bone metastases in August 2023, but minimal symptoms. He received docetaxel chemotherapy with a decrease in the PSA. Docetaxel was discontinued in May 2024.  He then had a significant increase in his PSA and progression of the bone metastases.  PET scan revealed multiple new lesions of the spine  compared to August 2023, including T12-L1 and L2, sacrum, multiple rib lesions, multiple scapular lesions and pelvic lesions.  Other lesions so increased in size.  He has widespread skeletal metastases, but no evidence of nodal or visceral metastasis.  He continues zoledronic acid and leuprolide every 3 months and he will proceed with those today.  He is also undergoing chemotherapy with cabazitaxel/prednisone.  Anemia He had an upper GI hemorrhage earlier this year and was referred to Dr. [name]. Fortunately, he just had the one episode and was transfused. His hemoglobin came up to 11.1 and his hemoglobin remained stable at 11.3. He had a EGD scheduled in April, but it was canceled. His cardiologist Dr. Allyson Sabal was not comfortable with him stopping clopidogrel for another year. His bleeding has stopped and so we will not pursue an EGD at this time unless he begins having melena again and/or a significant drop in his hemoglobin.  His hemoglobin is stable. He denies obvious blood loss, but could have chronic mild GI losses. Chemotherapy and bone metastasis can be contributing. We will continue to monitor this.    The patient understands the plans discussed today and is in agreement with them.  He knows to contact our office if he develops concerns prior to his next appointment.   I provided 30 minutes of face-to-face time during this encounter and > 50% was spent counseling as documented under my assessment and plan.    Adah Perl, PA-C  [facility] [place] - A DEPT OF . [facility] [phone]  [place] [address] Dept: [phone] Dept Fax: [phone]   Orders Placed This Encounter  Procedures   PSA    Standing Status:   Standing    Number of Occurrences:   33    Standing Expiration Date:   02/23/2024      CHIEF COMPLAINT:  CC: Stage IVA prostate caner  Current Treatment: Cabazitaxel/prednisone/leuprolide/zoledronic  acid  HISTORY OF PRESENT ILLNESS:   Oncology History  Malignant neoplasm of prostate (HCC)  03/30/2014 Cancer Staging   Staging form: Prostate, AJCC 8th Edition - Clinical stage from 03/30/2014: Stage IVA (cT3b, cN1, cM0, PSA: 51.8, Grade Group: 4) - Signed by [name], MD on 11/14/2020 Histopathologic type: Adenocarcinoma, NOS Stage prefix: Initial diagnosis Prostate specific antigen (PSA) range: 20 or greater Gleason primary pattern: 4 Gleason secondary pattern: 4 Gleason score: 8 Histologic grading system: 5 grade system Location of positive needle core biopsies: Beyond primary site Prognostic indicators: May 2016 Robotic prostatectomy with mult. Pos margins, extra prostatic extension, 2/6 pos nodes. Scan June 2016 positive at T10  PSA up to 216.3 by August Placed on lupron and casodex Stage used in treatment planning: Yes National guidelines used in treatment planning: Yes Type of national guideline used in treatment planning: NCCN   02/18/2020 Initial Diagnosis   Malignant neoplasm of prostate (HCC)   12/01/2021 - 07/19/2022 Chemotherapy   Patient is on Treatment Plan : PROSTATE Docetaxel (75) + Prednisone q21d     12/21/2022 -  Chemotherapy   Patient is on Treatment Plan : PROSTATE Cabazitaxel (20) D1 + Prednisone D1-21 q21d     Secondary malignant neoplasm of bone and bone marrow (HCC)  02/18/2020 Initial Diagnosis   Secondary malignant neoplasm of bone and bone marrow (HCC)   12/01/2021 - 07/19/2022 Chemotherapy   Patient is on Treatment Plan : PROSTATE Docetaxel (75) + Prednisone q21d     12/21/2022 -  Chemotherapy   Patient is on Treatment Plan : PROSTATE Cabazitaxel (20) D1 + Prednisone D1-21 q21d     Malignant neoplasm of prostate metastatic to lung (HCC)  11/25/2021 Initial Diagnosis   Malignant neoplasm of prostate metastatic to lung (HCC)   12/21/2022 -  Chemotherapy   Patient is on Treatment Plan : PROSTATE Cabazitaxel (20) D1 + Prednisone D1-21 q21d          INTERVAL HISTORY:  Marvin Johnson is here today for repeat clinical assessment prior to a 4th cycle of cabazitaxel/prednisone.  He is due for leuprolide and zoledronic acid today as well.  He continues to tolerate treatment fairly well and denies complaints today.  He denies fevers or chills. He states his pain is well-controlled with the current regimen. His appetite is good. His weight has decreased 3 pounds over last 3 weeks .  He states his friend, [name], has been diagnosed with kidney cancer and will have surgery in January.  REVIEW OF SYSTEMS:  Review of Systems  Constitutional:  Negative for appetite change, chills, diaphoresis, fatigue, fever and unexpected weight change.  HENT:   Negative for lump/mass, mouth sores, nosebleeds, sore throat and trouble swallowing.   Eyes:  Positive for eye problems (bleeding behind left eye).  Respiratory:  Negative for cough, hemoptysis and shortness of breath.   Cardiovascular:  Negative for chest pain and leg swelling.  Gastrointestinal:  Negative for abdominal pain, blood in stool, constipation, diarrhea, nausea and vomiting.  Genitourinary:  Negative for difficulty urinating, dysuria, frequency, hematuria and pelvic pain.   Musculoskeletal:  Negative for arthralgias, back pain,  gait problem and myalgias.  Skin:  Negative for itching, rash and wound.  Neurological:  Negative for dizziness, extremity weakness, gait problem, headaches, light-headedness and numbness.  Hematological:  Negative for adenopathy. Does not bruise/bleed easily.  Psychiatric/Behavioral:  Negative for depression and sleep disturbance. The patient is not nervous/anxious.      VITALS:  Blood pressure 105/65, pulse (!) 59, temperature 98.2 F (36.8 C), temperature source Oral, resp. rate 18, height 5\' 7"  (1.702 m), weight 188 lb 8 oz (85.5 kg), SpO2 95%.  Wt Readings from Last 3 Encounters:  02/23/23 188 lb 8 oz (85.5 kg)  02/03/23 191 lb (86.6 kg)  02/01/23 191 lb 3.2 oz (86.7  kg)    Body mass index is 29.52 kg/m.  Performance status (ECOG): 1 - Symptomatic but completely ambulatory  PHYSICAL EXAM:  Physical Exam Vitals and nursing note reviewed.  Constitutional:      General: He is not in acute distress.    Appearance: Normal appearance. He is normal weight. He is not ill-appearing.  HENT:     Head: Normocephalic and atraumatic.     Mouth/Throat:     Mouth: Mucous membranes are moist.     Pharynx: Oropharynx is clear. No oropharyngeal exudate or posterior oropharyngeal erythema.  Eyes:     General: No scleral icterus.    Extraocular Movements: Extraocular movements intact.     Conjunctiva/sclera: Conjunctivae normal.     Pupils: Pupils are equal, round, and reactive to light.  Cardiovascular:     Rate and Rhythm: Normal rate and regular rhythm.     Heart sounds: Normal heart sounds. No murmur heard.    No friction rub. No gallop.  Pulmonary:     Effort: Pulmonary effort is normal.     Breath sounds: Normal breath sounds. No wheezing, rhonchi or rales.  Abdominal:     General: Bowel sounds are normal. There is no distension.     Palpations: Abdomen is soft. There is no hepatomegaly, splenomegaly or mass.     Tenderness: There is no abdominal tenderness.  Musculoskeletal:        General: Normal range of motion.     Cervical back: Normal range of motion and neck supple. No tenderness.     Right lower leg: No edema.     Left lower leg: No edema.  Lymphadenopathy:     Cervical: No cervical adenopathy.     Upper Body:     Right upper body: No supraclavicular or axillary adenopathy.     Left upper body: No supraclavicular or axillary adenopathy.     Lower Body: No right inguinal adenopathy. No left inguinal adenopathy.  Skin:    General: Skin is warm and dry.     Coloration: Skin is not jaundiced.     Findings: No rash.  Neurological:     Mental Status: He is alert and oriented to person, place, and time.     Cranial Nerves: No cranial nerve  deficit.  Psychiatric:        Mood and Affect: Mood normal.        Behavior: Behavior normal.        Thought Content: Thought content normal.    LABS:      Latest Ref Rng & Units 02/23/2023    8:21 AM 02/01/2023    8:23 AM 01/06/2023   12:00 AM  CBC  WBC 4.0 - 10.5 K/uL 10.2  9.5  27.6      Hemoglobin 13.0 - 17.0 g/dL  11.3  11.2  12.2      Hematocrit 39.0 - 52.0 % 35.3  35.5  38      Platelets 150 - 400 K/uL 220  223  174         This result is from an external source.      Latest Ref Rng & Units 02/23/2023    8:21 AM 02/01/2023    8:23 AM 01/06/2023   12:00 AM  CMP  Glucose 70 - 99 mg/dL 161  096    BUN 8 - 23 mg/dL 14  12  15       Creatinine 0.61 - 1.24 mg/dL 0.45  4.09  0.7      Sodium 135 - 145 mmol/L 141  140  137      Potassium 3.5 - 5.1 mmol/L 3.7  3.7  3.9      Chloride 98 - 111 mmol/L 104  105  102      CO2 22 - 32 mmol/L 25  24  31       Calcium 8.9 - 10.3 mg/dL 9.3  9.4  9.0      Total Protein 6.5 - 8.1 g/dL 6.5  6.5    Total Bilirubin <1.2 mg/dL 0.4  0.3    Alkaline Phos 38 - 126 U/L 81  78  107      AST 15 - 41 U/L 26  25  35      ALT 0 - 44 U/L 18  18  28          This result is from an external source.      Lab Results  Component Value Date   PSA1 1,362.0 (H) 02/01/2023    Lab Results  Component Value Date   TIBC 398 01/29/2022   FERRITIN 206 01/29/2022   IRONPCTSAT 25 01/29/2022   No results found for: "LDH"  STUDIES:  No results found.    HISTORY:   Past Medical History:  Diagnosis Date   Hyperlipidemia    Malignant neoplasm of prostate (HCC)    Supraventricular tachycardia (HCC)     Past Surgical History:  Procedure Laterality Date   CORONARY STENT INTERVENTION N/A 02/08/2022   Procedure: CORONARY STENT INTERVENTION;  Surgeon: Yvonne Kendall, MD;  Location: [facility] INVASIVE [facility];  Service: Cardiovascular;  Laterality: N/A;   LEFT HEART CATH AND CORONARY ANGIOGRAPHY N/A 02/08/2022   Procedure: LEFT HEART CATH AND CORONARY  ANGIOGRAPHY;  Surgeon: Yvonne Kendall, MD;  Location: [facility] INVASIVE CV LAB;  Service: Cardiovascular;  Laterality: N/A;   PROSTATECTOMY  07/2014   TONSILLECTOMY      Family History  Problem Relation Age of Onset   Breast cancer Mother 59   Ovarian cancer Mother 12   Breast cancer Paternal Grandmother 29    Social History:  reports that he has never smoked. He has never used smokeless tobacco. He reports that he does not currently use alcohol. He reports that he does not use drugs.The patient is alone today.  Allergies:  Allergies  Allergen Reactions   Atorvastatin    Rosuvastatin Other (See Comments)    Other reaction(s): Muscle pain   Simvastatin Other (See Comments)    Other reaction(s): Joint pain, Muscle pain   Sulfa Antibiotics    Sulfamethoxazole-Trimethoprim Other (See Comments)    Unknown as to interaction was a baby.    Current Medications: Current Outpatient Medications  Medication Sig Dispense Refill   Alirocumab (PRALUENT [place]) Inject into the skin. One injection every 2  weeks Monday     APPLE CIDER VINEGAR PO Take by mouth. (Patient not taking: Reported on 01/06/2023)     Cholecalciferol 25 MCG (1000 UT) tablet Take 1,000 Units by mouth in the morning and at bedtime.     clopidogrel (PLAVIX) 75 MG tablet Take 75 mg by mouth daily.     diphenhydrAMINE (BENADRYL) 25 mg capsule Take 1 capsule by mouth at bedtime.     diphenhydrAMINE (BENADRYL) 50 MG capsule Take 50 mg by mouth at bedtime as needed.     ezetimibe (ZETIA) 10 MG tablet Take 10 mg by mouth daily.     fluconazole (DIFLUCAN) 100 MG tablet Take 1 tablet (100 mg total) by mouth daily. (Patient not taking: Reported on 01/06/2023) 10 tablet 0   isosorbide mononitrate (IMDUR) 30 MG 24 hr tablet Take 1 tablet by mouth every 8 (eight) hours as needed.     leuprolide (LUPRON) 22.5 MG injection Inject 22.5 mg into the muscle every 3 (three) months.     metoprolol tartrate (LOPRESSOR) 25 MG tablet Take 12.5 mg by  mouth 2 (two) times daily.     morphine (MS CONTIN) 15 MG 12 hr tablet Take 1 tablet (15 mg total) by mouth every 12 (twelve) hours. 60 tablet 0   Multiple Vitamins-Minerals (MULTIVITAMIN WITH MINERALS) tablet Take 1 tablet by mouth daily.     nitroGLYCERIN (NITROSTAT) 0.4 MG SL tablet Place 0.4 mg under the tongue every 5 (five) minutes as needed for chest pain. Repeat every 5 minutes if needed for a total of 3 tablets in 15 minutes. If no relief, CALL 911.     Omega-3 Fatty Acids (FISH OIL) 1000 MG CAPS Take 2,000 mg by mouth in the morning and at bedtime.     ondansetron (ZOFRAN) 8 MG tablet Take 8 mg by mouth every 8 (eight) hours as needed for nausea or vomiting.     polyethylene glycol powder (GLYCOLAX/MIRALAX) 17 GM/SCOOP powder Take 1 Container by mouth once.     predniSONE (DELTASONE) 5 MG tablet Take 1 tablet (5 mg total) by mouth 2 (two) times daily with a meal. 60 tablet 5   prochlorperazine (COMPAZINE) 10 MG tablet Take 10 mg by mouth every 6 (six) hours as needed for nausea or vomiting.     Royal Jelly-Bee Pollen-Ginseng (KOREAN GINSENG COMPLEX) 150-250-50 MG CAPS Take 1 capsule by mouth daily.     triamcinolone (KENALOG) 0.025 % cream Apply 1 Application topically 2 (two) times daily. 30 g 0   Zoledronic Acid (ZOMETA) 4 MG/100ML IVPB Inject 4 mg into the vein every 3 (three) months.     No current facility-administered medications for this visit.

## 2023-02-23 NOTE — Telephone Encounter (Signed)
Called patient and notified him of his lab results/ ?

## 2023-02-23 NOTE — Patient Instructions (Signed)
[facility] [place] - A DEPT OF [facility]  Discharge Instructions: Thank you for choosing [facility] to provide your oncology and hematology care.  If you have a lab appointment with the Cancer Center, please go directly to the Cancer Center and check in at the registration area.   Wear comfortable clothing and clothing appropriate for easy access to any Portacath or PICC line.   We strive to give you quality time with your provider. You may need to reschedule your appointment if you arrive late (15 or more minutes).  Arriving late affects you and other patients whose appointments are after yours.  Also, if you miss three or more appointments without notifying the office, you may be dismissed from the clinic at the provider's discretion.      For prescription refill requests, have your pharmacy contact our office and allow 72 hours for refills to be completed.    Today you received the following chemotherapy and/or immunotherapy agents Jevtana       To help prevent nausea and vomiting after your treatment, we encourage you to take your nausea medication as directed.  BELOW ARE SYMPTOMS THAT SHOULD BE REPORTED IMMEDIATELY: *FEVER GREATER THAN 100.4 F (38 C) OR HIGHER *CHILLS OR SWEATING *NAUSEA AND VOMITING THAT IS NOT CONTROLLED WITH YOUR NAUSEA MEDICATION *UNUSUAL SHORTNESS OF BREATH *UNUSUAL BRUISING OR BLEEDING *URINARY PROBLEMS (pain or burning when urinating, or frequent urination) *BOWEL PROBLEMS (unusual diarrhea, constipation, pain near the anus) TENDERNESS IN MOUTH AND THROAT WITH OR WITHOUT PRESENCE OF ULCERS (sore throat, sores in mouth, or a toothache) UNUSUAL RASH, SWELLING OR PAIN  UNUSUAL VAGINAL DISCHARGE OR ITCHING   Items with * indicate a potential emergency and should be followed up as soon as possible or go to the Emergency Department if any problems should occur.  Please show the CHEMOTHERAPY ALERT CARD or IMMUNOTHERAPY ALERT  CARD at check-in to the Emergency Department and triage nurse.  Should you have questions after your visit or need to cancel or reschedule your appointment, please contact [facility] [place] - A DEPT OF [facility]  Dept: [phone]  and follow the prompts.  Office hours are 8:00 a.m. to 4:30 p.m. Monday - Friday. Please note that voicemails left after 4:00 p.m. may not be returned until the following business day.  We are closed weekends and major holidays. You have access to a nurse at all times for urgent questions. Please call the main number to the clinic Dept: [phone] and follow the prompts.  For any non-urgent questions, you may also contact your provider using MyChart. We now offer e-Visits for anyone 43 and older to request care online for non-urgent symptoms. For details visit [email or web address].   Also download the MyChart app! Go to the app store, search "MyChart", open the app, select , and log in with your MyChart username and password.

## 2023-02-24 ENCOUNTER — Other Ambulatory Visit: Payer: Self-pay

## 2023-02-25 ENCOUNTER — Inpatient Hospital Stay: Payer: No Typology Code available for payment source

## 2023-02-25 ENCOUNTER — Other Ambulatory Visit: Payer: Self-pay

## 2023-02-25 VITALS — BP 126/76 | HR 75 | Temp 98.0°F | Resp 18

## 2023-02-25 DIAGNOSIS — C7951 Secondary malignant neoplasm of bone: Secondary | ICD-10-CM

## 2023-02-25 DIAGNOSIS — Z5111 Encounter for antineoplastic chemotherapy: Secondary | ICD-10-CM | POA: Diagnosis not present

## 2023-02-25 DIAGNOSIS — C61 Malignant neoplasm of prostate: Secondary | ICD-10-CM

## 2023-02-25 MED ORDER — MORPHINE SULFATE ER 15 MG PO TBCR: 15.0000 mg | EXTENDED_RELEASE_TABLET | Freq: Two times a day (BID) | ORAL | 0 refills | Status: DC

## 2023-02-25 MED ORDER — PEGFILGRASTIM-FPGK 6 MG/0.6ML ~~LOC~~ SOSY
6.0000 mg | PREFILLED_SYRINGE | Freq: Once | SUBCUTANEOUS | Status: AC
  Administered 2023-02-25: 6 mg via SUBCUTANEOUS
  Filled 2023-02-25: qty 0.6

## 2023-02-25 NOTE — Patient Instructions (Signed)

## 2023-02-28 ENCOUNTER — Telehealth: Payer: Self-pay

## 2023-02-28 ENCOUNTER — Other Ambulatory Visit: Payer: Self-pay

## 2023-02-28 DIAGNOSIS — C7951 Secondary malignant neoplasm of bone: Secondary | ICD-10-CM

## 2023-02-28 MED ORDER — MORPHINE SULFATE ER 30 MG PO TBCR: EXTENDED_RELEASE_TABLET | ORAL | 0 refills | Status: DC

## 2023-02-28 NOTE — Telephone Encounter (Signed)
Pt needs a MS 30mg  tab 1/2 tab po q 12hr pain script to be sent to [facility].

## 2023-03-01 ENCOUNTER — Other Ambulatory Visit: Payer: Self-pay | Admitting: Hematology and Oncology

## 2023-03-01 ENCOUNTER — Telehealth: Payer: Self-pay

## 2023-03-01 MED ORDER — OXYCODONE HCL ER 10 MG PO T12A: 10.0000 mg | EXTENDED_RELEASE_TABLET | Freq: Two times a day (BID) | ORAL | 0 refills | Status: DC

## 2023-03-01 NOTE — Telephone Encounter (Signed)
-----   Message from Adah Perl sent at 03/01/2023  8:42 AM EST ----- Regarding: RE: Morphine I did not switch him to 30 mg and instruct him to split in half. We can try oxycodone ER 10 mg which is the equivalent to morphine ER. I'll send it now. ----- Message ----- From: Dyane Dustman, RN Sent: 03/01/2023   8:23 AM EST To: Hipolito Bayley, RN; Adah Perl, PA-C Subject: Morphine                                       [name], patient friend [phone], called.  Normally takes Morphine ER 15mg  BID, states this is out of production at this time.  Was switched to Morphine ER 30mg  and instructed to cut in half.  The pharmacy would not fill the 30mg  because being time released it cannot be cut in half.  They are requesting a switch to another type of pain pill that they can get in time release.

## 2023-03-10 ENCOUNTER — Other Ambulatory Visit: Payer: Self-pay | Admitting: Oncology

## 2023-03-10 DIAGNOSIS — C7951 Secondary malignant neoplasm of bone: Secondary | ICD-10-CM

## 2023-03-10 DIAGNOSIS — C7800 Secondary malignant neoplasm of unspecified lung: Secondary | ICD-10-CM

## 2023-03-10 DIAGNOSIS — C61 Malignant neoplasm of prostate: Secondary | ICD-10-CM

## 2023-03-17 ENCOUNTER — Other Ambulatory Visit: Payer: Self-pay | Admitting: Oncology

## 2023-03-17 ENCOUNTER — Inpatient Hospital Stay: Payer: No Typology Code available for payment source | Admitting: Oncology

## 2023-03-17 ENCOUNTER — Inpatient Hospital Stay: Payer: No Typology Code available for payment source | Attending: Oncology

## 2023-03-17 ENCOUNTER — Encounter: Payer: Self-pay | Admitting: Oncology

## 2023-03-17 ENCOUNTER — Inpatient Hospital Stay: Payer: No Typology Code available for payment source

## 2023-03-17 VITALS — BP 123/64 | HR 81 | Temp 98.3°F | Resp 17 | Ht 67.0 in | Wt 185.5 lb

## 2023-03-17 DIAGNOSIS — Z5111 Encounter for antineoplastic chemotherapy: Secondary | ICD-10-CM | POA: Insufficient documentation

## 2023-03-17 DIAGNOSIS — Z803 Family history of malignant neoplasm of breast: Secondary | ICD-10-CM | POA: Insufficient documentation

## 2023-03-17 DIAGNOSIS — R21 Rash and other nonspecific skin eruption: Secondary | ICD-10-CM

## 2023-03-17 DIAGNOSIS — C7951 Secondary malignant neoplasm of bone: Secondary | ICD-10-CM

## 2023-03-17 DIAGNOSIS — C7800 Secondary malignant neoplasm of unspecified lung: Secondary | ICD-10-CM

## 2023-03-17 DIAGNOSIS — C61 Malignant neoplasm of prostate: Secondary | ICD-10-CM | POA: Diagnosis not present

## 2023-03-17 DIAGNOSIS — D649 Anemia, unspecified: Secondary | ICD-10-CM | POA: Diagnosis not present

## 2023-03-17 DIAGNOSIS — J209 Acute bronchitis, unspecified: Secondary | ICD-10-CM | POA: Diagnosis not present

## 2023-03-17 DIAGNOSIS — I471 Supraventricular tachycardia, unspecified: Secondary | ICD-10-CM | POA: Diagnosis not present

## 2023-03-17 DIAGNOSIS — Z7952 Long term (current) use of systemic steroids: Secondary | ICD-10-CM | POA: Diagnosis not present

## 2023-03-17 DIAGNOSIS — E785 Hyperlipidemia, unspecified: Secondary | ICD-10-CM | POA: Insufficient documentation

## 2023-03-17 DIAGNOSIS — Z79899 Other long term (current) drug therapy: Secondary | ICD-10-CM | POA: Insufficient documentation

## 2023-03-17 DIAGNOSIS — E876 Hypokalemia: Secondary | ICD-10-CM | POA: Diagnosis not present

## 2023-03-17 DIAGNOSIS — C7952 Secondary malignant neoplasm of bone marrow: Secondary | ICD-10-CM | POA: Diagnosis not present

## 2023-03-17 DIAGNOSIS — Z7902 Long term (current) use of antithrombotics/antiplatelets: Secondary | ICD-10-CM | POA: Insufficient documentation

## 2023-03-17 DIAGNOSIS — Z8041 Family history of malignant neoplasm of ovary: Secondary | ICD-10-CM | POA: Diagnosis not present

## 2023-03-17 DIAGNOSIS — Z5189 Encounter for other specified aftercare: Secondary | ICD-10-CM | POA: Insufficient documentation

## 2023-03-17 LAB — CBC WITH DIFFERENTIAL (CANCER CENTER ONLY)
Abs Immature Granulocytes: 0.11 10*3/uL — ABNORMAL HIGH (ref 0.00–0.07)
Basophils Absolute: 0.1 10*3/uL (ref 0.0–0.1)
Basophils Relative: 0 %
Eosinophils Absolute: 0 10*3/uL (ref 0.0–0.5)
Eosinophils Relative: 0 %
HCT: 34.2 % — ABNORMAL LOW (ref 39.0–52.0)
Hemoglobin: 10.7 g/dL — ABNORMAL LOW (ref 13.0–17.0)
Immature Granulocytes: 1 %
Lymphocytes Relative: 17 %
Lymphs Abs: 2.1 10*3/uL (ref 0.7–4.0)
MCH: 28.8 pg (ref 26.0–34.0)
MCHC: 31.3 g/dL (ref 30.0–36.0)
MCV: 92.2 fL (ref 80.0–100.0)
Monocytes Absolute: 1.7 10*3/uL — ABNORMAL HIGH (ref 0.1–1.0)
Monocytes Relative: 14 %
Neutro Abs: 8.5 10*3/uL — ABNORMAL HIGH (ref 1.7–7.7)
Neutrophils Relative %: 68 %
Platelet Count: 216 10*3/uL (ref 150–400)
RBC: 3.71 MIL/uL — ABNORMAL LOW (ref 4.22–5.81)
RDW: 18 % — ABNORMAL HIGH (ref 11.5–15.5)
WBC Count: 12.5 10*3/uL — ABNORMAL HIGH (ref 4.0–10.5)
nRBC: 0 /100{WBCs}
nRBC: 0.02 % (ref 0.0–0.2)

## 2023-03-17 LAB — CMP (CANCER CENTER ONLY)
ALT: 16 U/L (ref 0–44)
AST: 32 U/L (ref 15–41)
Albumin: 4.3 g/dL (ref 3.5–5.0)
Alkaline Phosphatase: 75 U/L (ref 38–126)
Anion gap: 11 (ref 5–15)
BUN: 16 mg/dL (ref 8–23)
CO2: 27 mmol/L (ref 22–32)
Calcium: 9.2 mg/dL (ref 8.9–10.3)
Chloride: 102 mmol/L (ref 98–111)
Creatinine: 0.84 mg/dL (ref 0.61–1.24)
GFR, Estimated: >60 mL/min (ref >60)
Glucose, Bld: 113 mg/dL — ABNORMAL HIGH (ref 70–99)
Potassium: 3.5 mmol/L (ref 3.5–5.1)
Sodium: 141 mmol/L (ref 135–145)
Total Bilirubin: 0.3 mg/dL (ref 0.0–1.2)
Total Protein: 6.8 g/dL (ref 6.5–8.1)

## 2023-03-17 LAB — PSA: Prostatic Specific Antigen: 741.46 ng/mL — ABNORMAL HIGH (ref 0.00–4.00)

## 2023-03-17 MED ORDER — HEPARIN SOD (PORK) LOCK FLUSH 100 UNIT/ML IV SOLN
500.0000 [IU] | Freq: Once | INTRAVENOUS | Status: AC
Start: 2023-03-17 — End: 2023-03-17
  Administered 2023-03-17: 500 [IU] via INTRAVENOUS

## 2023-03-17 MED ORDER — SODIUM CHLORIDE 0.9% FLUSH
10.0000 mL | INTRAVENOUS | Status: AC | PRN
  Administered 2023-03-17: 10 mL via INTRAVENOUS

## 2023-03-17 MED ORDER — AZITHROMYCIN 250 MG PO TABS: ORAL_TABLET | ORAL | 0 refills | Status: DC

## 2023-03-17 MED ORDER — TRIAMCINOLONE ACETONIDE 0.025 % EX CREA: 1.0000 | TOPICAL_CREAM | Freq: Two times a day (BID) | CUTANEOUS | 5 refills | Status: DC

## 2023-03-17 NOTE — Addendum Note (Signed)
 Addended by: Clovis Cao on: 03/17/2023 09:55 AM   Modules accepted: Orders

## 2023-03-17 NOTE — Progress Notes (Addendum)
 [facility]  [address] [place],  [place]  [address] [phone]  Clinic Day: March 17, 2023  Referring physician: Paniagua, Tiziana, MD  ASSESSMENT & PLAN:  Assessment: Malignant neoplasm of prostate [facility]) Stage IVA prostate cancer diagnosed in January 2016.  He had progressive disease in the bone and lung in September 2023, so was placed on docetaxel  chemotherapy and continued leuprolide  and zoledronic  acid.  His PSA went down from 536 to 400.75 after 3 cycles, then to 284 with the 5th cycle, and then 203 in March 2024. PSA then went up to 261 in May. We decided to give him a break from chemotherapy at that time.  We continued leuprolide  and zolendronic acid every 3 months. The PSA was 1156. PSMA PET in September revealed progression of skeletal metastasis. Multiple new radiotracer avid lesions within the spine. Individual lesions are increased in size compared to prior accompanied by sclerotic CT change. 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 No evidence of nodal metastasis or visceral metastasis. He is receiving palliative chemotherapy with cabazitaxel /prednisone . His PSA was up to 1608 prior to starting was down to 1317 prior to a 2nd cycle and 843 prior to his 4th cycle. PSA is pending from today. He continues to tolerate treatment fairly well. We will post-pone him by 1 week due to acute bronchitis.    Secondary malignant neoplasm of bone and bone marrow (HCC) He continues zoledronic  acid every 3 months in addition to his cancer therapy. He had marked progression of his bone metastases in August 2023, but minimal symptoms. He received docetaxel  chemotherapy with a decrease in the PSA. Docetaxel  was discontinued in May 2024.  He then had a significant increase in his PSA and progression of the bone metastases.  PET scan revealed multiple new lesions of the spine compared to August 2023, including T12-L1 and L2, sacrum, multiple rib lesions,  multiple scapular lesions and pelvic lesions.  Other lesions so increased in size.  He has widespread skeletal metastases, but no evidence of nodal or visceral metastasis.  He continues zoledronic  acid and leuprolide  every 3 months. He is also undergoing chemotherapy with cabazitaxel /prednisone .   Anemia He had an upper GI hemorrhage last year and was referred to Dr. Larene. Fortunately, he just had the one episode and was transfused. His hemoglobin came up to 11.1 and his hemoglobin remained stable at 11.3. He had a EGD scheduled in April, but it was canceled. His cardiologist Dr. Court was not comfortable with him stopping clopidogrel  for another year. His bleeding has stopped and so we will not pursue an EGD at this time unless he begins having melena again and/or a significant drop in his hemoglobin.  His hemoglobin is stable. He denies obvious blood loss. His anemia is a little worse today but likely related to his current infection, chemotherapy, and bone metastasis.  We will continue to monitor this.  Acute Bronchitis He is coughing with production of green sputum and has had pneumonia in the past. His WBC is elevated at 12.5. He feels that he is a little better but I feel we need to post-pone his chemotherapy treatment by 1 week. I will order a Z-pack.   Plan: I will order a Z-pack today. He has been tolerating treatment well and his day 1 cycle 5 of Cabazitaxel  and Prednisone  was scheduled for 03/17/2023. He has an elevated WBC of 12.5 with elevated ANC of 8500, low hemoglobin of 10.7, and platelet count of 216,000. I will  post-pone his treatment for today by 1 week. His CMP is normal and PSA today is pending. His PSA dropped from 1,156.86 to 843.77 as of 02/23/2023. I will see him back in 1 week with CBC and CMP. The patient understands the plans discussed today and is in agreement with them.  He knows to contact our office if he develops concerns prior to his next appointment.  I provided  25 minutes of face-to-face time during this encounter and > 50% was spent counseling as documented under my assessment and plan.   [name], MD  [facility] - A DEPT OF [facility] [address] [place] [address] Dept: [phone] Dept Fax: [phone]   No orders of the defined types were placed in this encounter.  CHIEF COMPLAINT:  CC: Stage IVA prostate cancer  Current Treatment: Cabazitaxel /prednisone /leuprolide /zoledronic  acid  HISTORY OF PRESENT ILLNESS:   Oncology History  Malignant neoplasm of prostate (HCC)  03/30/2014 Cancer Staging   Staging form: Prostate, AJCC 8th Edition - Clinical stage from 03/30/2014: Stage IVA (cT3b, cN1, cM0, PSA: 51.8, Grade Group: 4) - Signed by Cornish Wanda VEAR, MD on 11/14/2020 Histopathologic type: Adenocarcinoma, NOS Stage prefix: Initial diagnosis Prostate specific antigen (PSA) range: 20 or greater Gleason primary pattern: 4 Gleason secondary pattern: 4 Gleason score: 8 Histologic grading system: 5 grade system Location of positive needle core biopsies: Beyond primary site Prognostic indicators: May 2016 Robotic prostatectomy with mult. Pos margins, extra prostatic extension, 2/6 pos nodes. Scan June 2016 positive at T10  PSA up to 216.3 by August Placed on lupron  and casodex Stage used in treatment planning: Yes National guidelines used in treatment planning: Yes Type of national guideline used in treatment planning: NCCN   02/18/2020 Initial Diagnosis   Malignant neoplasm of prostate (HCC)   12/01/2021 - 07/19/2022 Chemotherapy   Patient is on Treatment Plan : PROSTATE Docetaxel  (75) + Prednisone  q21d     12/21/2022 -  Chemotherapy   Patient is on Treatment Plan : PROSTATE Cabazitaxel  (20) D1 + Prednisone  D1-21 q21d     Secondary malignant neoplasm of bone and bone marrow (HCC)  02/18/2020 Initial Diagnosis   Secondary malignant neoplasm of bone and bone  marrow (HCC)   12/01/2021 - 07/19/2022 Chemotherapy   Patient is on Treatment Plan : PROSTATE Docetaxel  (75) + Prednisone  q21d     12/21/2022 -  Chemotherapy   Patient is on Treatment Plan : PROSTATE Cabazitaxel  (20) D1 + Prednisone  D1-21 q21d     Malignant neoplasm of prostate metastatic to lung (HCC)  11/25/2021 Initial Diagnosis   Malignant neoplasm of prostate metastatic to lung (HCC)   12/21/2022 -  Chemotherapy   Patient is on Treatment Plan : PROSTATE Cabazitaxel  (20) D1 + Prednisone  D1-21 q21d      INTERVAL HISTORY:  Marvin Johnson is here today for repeat clinical assessment for his stage IVA prostate cancer. Patient states that he feels well but complains of a cough with green phlegm. He feels he is improving but still has significant symptoms. I will order a Z-pack today. He has been tolerating treatment well and his day 1 cycle 5 of Cabazitaxel  and Prednisone  was scheduled for 03/17/2023. He has a low WBC of 12.5 with elevated ANC of 8500, low hemoglobin of 10.7, and platelet count of 216,000. I will post-pone his treatment for today by 1 week. His CMP is normal and PSA today is pending. His PSA dropped from 1,156.86 to 843.77  as of 02/23/2023. I will see him back in 1 week with CBC and CMP. He denies signs of infection such as sore throat, sinus drainage, or urinary symptoms.  He denies fevers or recurrent chills. He denies pain. He denies nausea, vomiting, chest pain, dyspnea. His appetite is very good and his  weight has decreased 3 pounds over last 3 weeks .  REVIEW OF SYSTEMS:  Review of Systems  Constitutional:  Negative for appetite change, chills, diaphoresis, fatigue, fever and unexpected weight change.  HENT:  Negative.  Negative for hearing loss, lump/mass, mouth sores, nosebleeds, sore throat, tinnitus, trouble swallowing and voice change.   Eyes:  Negative for eye problems and icterus.  Respiratory:  Positive for cough (with green phlegm). Negative for chest tightness, hemoptysis,  shortness of breath and wheezing.   Cardiovascular:  Negative for chest pain, leg swelling and palpitations.  Gastrointestinal:  Negative for abdominal distention, abdominal pain, blood in stool, constipation, diarrhea, nausea, rectal pain and vomiting.  Genitourinary:  Negative for bladder incontinence, difficulty urinating, dyspareunia, dysuria, frequency, hematuria, nocturia, pelvic pain and penile discharge.   Musculoskeletal:  Positive for arthralgias (chroinic). Negative for back pain, flank pain, gait problem, myalgias, neck pain and neck stiffness.  Skin:  Negative for itching, rash and wound.  Neurological:  Negative for dizziness, extremity weakness, gait problem, headaches, light-headedness, numbness, seizures and speech difficulty.  Hematological:  Negative for adenopathy. Does not bruise/bleed easily.  Psychiatric/Behavioral:  Negative for confusion, decreased concentration, depression, sleep disturbance and suicidal ideas. The patient is not nervous/anxious.    VITALS:  Blood pressure 123/64, pulse 81, temperature 98.3 F (36.8 C), temperature source Oral, resp. rate 17, height 5' 7 (1.702 m), weight 185 lb 8 oz (84.1 kg), SpO2 96%.  Wt Readings from Last 3 Encounters:  03/17/23 185 lb 8 oz (84.1 kg)  02/23/23 188 lb 8 oz (85.5 kg)  02/03/23 191 lb (86.6 kg)    Body mass index is 29.05 kg/m.  Performance status (ECOG): 1 - Symptomatic but completely ambulatory  PHYSICAL EXAM:  Physical Exam Vitals and nursing note reviewed.  Constitutional:      General: He is not in acute distress.    Appearance: Normal appearance. He is normal weight. He is not ill-appearing.  HENT:     Head: Normocephalic and atraumatic.     Mouth/Throat:     Mouth: Mucous membranes are moist.     Pharynx: Oropharynx is clear. No oropharyngeal exudate or posterior oropharyngeal erythema.  Eyes:     General: No scleral icterus.    Extraocular Movements: Extraocular movements intact.      Conjunctiva/sclera: Conjunctivae normal.     Pupils: Pupils are equal, round, and reactive to light.  Cardiovascular:     Rate and Rhythm: Normal rate and regular rhythm.     Heart sounds: Normal heart sounds. No murmur heard.    No friction rub. No gallop.  Pulmonary:     Effort: Pulmonary effort is normal.     Breath sounds: Examination of the right-upper field reveals rhonchi. Examination of the right-lower field reveals rhonchi. Rhonchi present. No wheezing or rales.     Comments: Mild rhonchi  Abdominal:     General: Bowel sounds are normal. There is no distension.     Palpations: Abdomen is soft. There is no hepatomegaly, splenomegaly or mass.     Tenderness: There is no abdominal tenderness.  Musculoskeletal:        General: Normal range of motion.  Cervical back: Normal range of motion and neck supple. No tenderness.     Right lower leg: No edema.     Left lower leg: No edema.  Lymphadenopathy:     Cervical: No cervical adenopathy.     Upper Body:     Right upper body: No supraclavicular or axillary adenopathy.     Left upper body: No supraclavicular or axillary adenopathy.     Lower Body: No right inguinal adenopathy. No left inguinal adenopathy.  Skin:    General: Skin is warm and dry.     Coloration: Skin is not jaundiced.     Findings: No rash.  Neurological:     Mental Status: He is alert and oriented to person, place, and time.     Cranial Nerves: No cranial nerve deficit.  Psychiatric:        Mood and Affect: Mood normal.        Behavior: Behavior normal.        Thought Content: Thought content normal.    LABS:      Latest Ref Rng & Units 03/17/2023    9:01 AM 02/23/2023    8:21 AM 02/01/2023    8:23 AM  CBC  WBC 4.0 - 10.5 K/uL 12.5  10.2  9.5   Hemoglobin 13.0 - 17.0 g/dL 89.2  88.6  88.7   Hematocrit 39.0 - 52.0 % 34.2  35.3  35.5   Platelets 150 - 400 K/uL 216  220  223       Latest Ref Rng & Units 03/17/2023    9:01 AM 02/23/2023    8:21 AM  02/01/2023    8:23 AM  CMP  Glucose 70 - 99 mg/dL 886  869  895   BUN 8 - 23 mg/dL 16  14  12    Creatinine 0.61 - 1.24 mg/dL 9.15  9.12  9.19   Sodium 135 - 145 mmol/L 141  141  140   Potassium 3.5 - 5.1 mmol/L 3.5  3.7  3.7   Chloride 98 - 111 mmol/L 102  104  105   CO2 22 - 32 mmol/L 27  25  24    Calcium 8.9 - 10.3 mg/dL 9.2  9.3  9.4   Total Protein 6.5 - 8.1 g/dL 6.8  6.5  6.5   Total Bilirubin 0.0 - 1.2 mg/dL 0.3  0.4  0.3   Alkaline Phos 38 - 126 U/L 75  81  78   AST 15 - 41 U/L 32  26  25   ALT 0 - 44 U/L 16  18  18        Lab Results  Component Value Date   PSA1 1,362.0 (H) 02/01/2023    Lab Results  Component Value Date   TIBC 398 01/29/2022   FERRITIN 206 01/29/2022   IRONPCTSAT 25 01/29/2022   No results found for: LDH  STUDIES:  No results found.    HISTORY:   Past Medical History:  Diagnosis Date   Hyperlipidemia    Malignant neoplasm of prostate (HCC)    Supraventricular tachycardia (HCC)     Past Surgical History:  Procedure Laterality Date   CORONARY STENT INTERVENTION N/A 02/08/2022   Procedure: CORONARY STENT INTERVENTION;  Surgeon: Mady Bruckner, MD;  Location: [facility] INVASIVE CV LAB;  Service: Cardiovascular;  Laterality: N/A;   LEFT HEART CATH AND CORONARY ANGIOGRAPHY N/A 02/08/2022   Procedure: LEFT HEART CATH AND CORONARY ANGIOGRAPHY;  Surgeon: Mady Bruckner, MD;  Location: [facility]VASIVE CV LAB;  Service: Cardiovascular;  Laterality: N/A;   PROSTATECTOMY  07/2014   TONSILLECTOMY      Family History  Problem Relation Age of Onset   Breast cancer Mother 40   Ovarian cancer Mother 24   Breast cancer Paternal Grandmother 46    Social History:  reports that he has never smoked. He has never used smokeless tobacco. He reports that he does not currently use alcohol. He reports that he does not use drugs.The patient is alone today.  Allergies:  Allergies  Allergen Reactions   Atorvastatin    Rosuvastatin Other (See Comments)     Other reaction(s): Muscle pain   Simvastatin Other (See Comments)    Other reaction(s): Joint pain, Muscle pain   Sulfa Antibiotics    Sulfamethoxazole-Trimethoprim Other (See Comments)    Unknown as to interaction was a baby.    Current Medications: Current Outpatient Medications  Medication Sig Dispense Refill   morphine  (MS CONTIN ) 15 MG 12 hr tablet Take 15 mg by mouth 2 (two) times daily.     Alirocumab (PRALUENT [place]) Inject into the skin. One injection every 2 weeks Monday     APPLE CIDER VINEGAR PO Take by mouth. (Patient not taking: Reported on 01/06/2023)     azithromycin  (ZITHROMAX  Z-PAK) 250 MG tablet 2 pills today, then 1 pill daily 6 each 0   Cholecalciferol 25 MCG (1000 UT) tablet Take 1,000 Units by mouth in the morning and at bedtime.     clopidogrel  (PLAVIX ) 75 MG tablet Take 75 mg by mouth daily.     diphenhydrAMINE  (BENADRYL ) 25 mg capsule Take 1 capsule by mouth at bedtime.     diphenhydrAMINE  (BENADRYL ) 50 MG capsule Take 50 mg by mouth at bedtime as needed.     ezetimibe  (ZETIA ) 10 MG tablet Take 10 mg by mouth daily.     fluconazole  (DIFLUCAN ) 100 MG tablet Take 1 tablet (100 mg total) by mouth daily. (Patient not taking: Reported on 01/06/2023) 10 tablet 0   isosorbide mononitrate (IMDUR) 30 MG 24 hr tablet Take 1 tablet by mouth every 8 (eight) hours as needed.     leuprolide  (LUPRON ) 22.5 MG injection Inject 22.5 mg into the muscle every 3 (three) months.     metoprolol  tartrate (LOPRESSOR ) 25 MG tablet Take 12.5 mg by mouth 2 (two) times daily.     Multiple Vitamins-Minerals (MULTIVITAMIN WITH MINERALS) tablet Take 1 tablet by mouth daily.     nitroGLYCERIN  (NITROSTAT ) 0.4 MG SL tablet Place 0.4 mg under the tongue every 5 (five) minutes as needed for chest pain. Repeat every 5 minutes if needed for a total of 3 tablets in 15 minutes. If no relief, CALL 911.     Omega-3 Fatty Acids (FISH OIL) 1000 MG CAPS Take 2,000 mg by mouth in the morning and at bedtime.      ondansetron  (ZOFRAN ) 8 MG tablet Take 8 mg by mouth every 8 (eight) hours as needed for nausea or vomiting.     oxyCODONE  (OXYCONTIN ) 10 mg 12 hr tablet Take 1 tablet (10 mg total) by mouth every 12 (twelve) hours. 60 tablet 0   polyethylene glycol powder (GLYCOLAX/MIRALAX) 17 GM/SCOOP powder Take 1 Container by mouth once.     predniSONE  (DELTASONE ) 5 MG tablet Take 1 tablet (5 mg total) by mouth 2 (two) times daily with a meal. 60 tablet 5   prochlorperazine  (COMPAZINE ) 10 MG tablet Take 10 mg by mouth every 6 (six) hours as needed for nausea or vomiting.  Royal Jelly-Bee Pollen-Ginseng (KOREAN GINSENG COMPLEX) 150-250-50 MG CAPS Take 1 capsule by mouth daily.     triamcinolone  (KENALOG ) 0.025 % cream Apply 1 Application topically 2 (two) times daily. 30 g 5   Zoledronic  Acid (ZOMETA ) 4 MG/100ML IVPB Inject 4 mg into the vein every 3 (three) months.     No current facility-administered medications for this visit.   Facility-Administered Medications Ordered in Other Visits  Medication Dose Route Frequency Provider Last Rate Last Admin   sodium chloride  flush (NS) 0.9 % injection 10 mL  10 mL Intravenous PRN Cornelius Wanda DEL, MD   10 mL at 03/17/23 9046    I,Jasmine M Lassiter,acting as a scribe for Wanda DEL Cornelius, MD.,have documented all relevant documentation on the behalf of Wanda DEL Cornelius, MD,as directed by  Wanda DEL Cornelius, MD while in the presence of Wanda DEL Cornelius, MD.

## 2023-03-18 ENCOUNTER — Ambulatory Visit: Payer: No Typology Code available for payment source

## 2023-03-18 ENCOUNTER — Other Ambulatory Visit: Payer: Self-pay

## 2023-03-18 ENCOUNTER — Encounter: Payer: Self-pay | Admitting: Oncology

## 2023-03-22 ENCOUNTER — Encounter: Payer: Self-pay | Admitting: Oncology

## 2023-03-22 NOTE — Progress Notes (Signed)
 [facility]  [address] [place],  [place]  [address] [phone]  Clinic Day:  03/24/2023  Referring physician: Paniagua, Tiziana, MD  ASSESSMENT & PLAN:   Assessment & Plan: Malignant neoplasm of prostate [facility]) Stage IVA prostate cancer diagnosed in January 2016.  He had progressive disease in the bone and lung in September 2023, so was placed on docetaxel  chemotherapy and continued leuprolide  and zoledronic  acid.  His PSA went down from 536 to 400.75 after 3 cycles, then to 284 with the 5th cycle, and then 203 in March 2024. PSA then went up to 261 in May. We decided to give him a break from chemotherapy at that time.  We continued leuprolide  and zolendronic acid every 3 months. The PSA was 1156. PSMA PET in September revealed progression of skeletal metastasis. Multiple new radiotracer avid lesions within the spine. Individual lesions are increased in size compared to prior accompanied by sclerotic CT change. 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 No evidence of nodal metastasis or visceral metastasis.   He is receiving palliative chemotherapy with cabazitaxel /prednisone . His PSA was up to 1608 prior to starting was down to 1317 prior to a 2nd cycle. PSA on January 2 was down to 741 from 843, consistent with a response to therapy.  He has recovered from his recent bronchitis.  He will proceed with a 5th cycle today. We will plan to see him back in 3 weeks prior to a 6th cycle of cabazitaxel /prednisone .  Secondary malignant neoplasm of bone and bone marrow (HCC) He continues zoledronic  acid every 3 months in addition to his cancer therapy. He had marked progression of his bone metastases in August 2023, but minimal symptoms. He received docetaxel  chemotherapy with a decrease in the PSA. Docetaxel  was discontinued in May 2024.  He then had a significant increase in his PSA and progression of the bone metastases.  PET scan revealed multiple new lesions  of the spine compared to August 2023, including T12-L1 and L2, sacrum, multiple rib lesions, multiple scapular lesions and pelvic lesions.  Other lesions so increased in size.  He has widespread skeletal metastases, but no evidence of nodal or visceral metastasis.  He continues zoledronic  acid and leuprolide  every 3 months and he will proceed with those today.  He is also undergoing chemotherapy with cabazitaxel /prednisone .  His PSA has steadily decreased, consistent with a response to therapy.  Hypokalemia New mild hypokalemia. I will give him IV potassium today and continue to monitor.    The patient understands the plans discussed today and is in agreement with them.  He knows to contact our office if he develops concerns prior to his next appointment.      Dezaria Methot A Niomi Valent, PA-C  [facility] - A DEPT OF [facility] [address] [place] [address] Dept: [phone] Dept Fax: [phone]   No orders of the defined types were placed in this encounter.     CHIEF COMPLAINT:  CC: Stage IVA prostate cancer   Current Treatment: Cabazitaxel /prednisone   HISTORY OF PRESENT ILLNESS:   Oncology History  Malignant neoplasm of prostate (HCC)  03/30/2014 Cancer Staging   Staging form: Prostate, AJCC 8th Edition - Clinical stage from 03/30/2014: Stage IVA (cT3b, cN1, cM0, PSA: 51.8, Grade Group: 4) - Signed by Cornelius Wanda VEAR, MD on 11/14/2020 Histopathologic type: Adenocarcinoma, NOS Stage prefix: Initial diagnosis Prostate specific antigen (PSA) range: 20 or greater Gleason primary pattern: 4 Gleason  secondary pattern: 4 Gleason score: 8 Histologic grading system: 5 grade system Location of positive needle core biopsies: Beyond primary site Prognostic indicators: May 2016 Robotic prostatectomy with mult. Pos margins, extra prostatic extension, 2/6 pos nodes. Scan June 2016 positive at T10  PSA up to 216.3 by August Placed  on lupron  and casodex Stage used in treatment planning: Yes National guidelines used in treatment planning: Yes Type of national guideline used in treatment planning: NCCN   02/18/2020 Initial Diagnosis   Malignant neoplasm of prostate (HCC)   12/01/2021 - 07/19/2022 Chemotherapy   Patient is on Treatment Plan : PROSTATE Docetaxel  (75) + Prednisone  q21d     12/21/2022 -  Chemotherapy   Patient is on Treatment Plan : PROSTATE Cabazitaxel  (20) D1 + Prednisone  D1-21 q21d     Secondary malignant neoplasm of bone and bone marrow (HCC)  02/18/2020 Initial Diagnosis   Secondary malignant neoplasm of bone and bone marrow (HCC)   12/01/2021 - 07/19/2022 Chemotherapy   Patient is on Treatment Plan : PROSTATE Docetaxel  (75) + Prednisone  q21d     12/21/2022 -  Chemotherapy   Patient is on Treatment Plan : PROSTATE Cabazitaxel  (20) D1 + Prednisone  D1-21 q21d     Malignant neoplasm of prostate metastatic to lung (HCC)  11/25/2021 Initial Diagnosis   Malignant neoplasm of prostate metastatic to lung (HCC)   12/21/2022 -  Chemotherapy   Patient is on Treatment Plan : PROSTATE Cabazitaxel  (20) D1 + Prednisone  D1-21 q21d         INTERVAL HISTORY:  Marvin Johnson is here today for repeat clinical assessment prior to a possible 5th cycle of cabazitaxel .  Chemotherapy was delayed last week as he presented with acute bronchitis.  He completed his azithromycin .  He reports intermittent cough occasionally productive of clear sputum.  He denies shortness of breath or chest pain.  He denies diarrhea.  He denies fevers or chills. He denies pain. His appetite is good. His weight has decreased 1 pounds over last 1 week .  REVIEW OF SYSTEMS:  Review of Systems  Constitutional:  Negative for appetite change, chills, diaphoresis, fatigue, fever and unexpected weight change.  HENT:   Negative for lump/mass, mouth sores, nosebleeds and sore throat.   Respiratory:  Positive for cough (productive of clear sputum). Negative for  hemoptysis and shortness of breath.   Cardiovascular:  Negative for chest pain and leg swelling.  Gastrointestinal:  Negative for abdominal pain, blood in stool, constipation, diarrhea, nausea and vomiting.  Endocrine: Negative for hot flashes.  Genitourinary:  Negative for difficulty urinating, dysuria, frequency and hematuria.   Musculoskeletal:  Negative for arthralgias, back pain, myalgias and neck pain.  Skin:  Negative for itching, rash and wound.  Neurological:  Negative for dizziness, extremity weakness, headaches, light-headedness and numbness.  Hematological:  Negative for adenopathy. Bruises/bleeds easily (easy bruising).  Psychiatric/Behavioral:  Negative for depression and sleep disturbance. The patient is not nervous/anxious.      VITALS:  Blood pressure 106/61, pulse 70, temperature 98.5 F (36.9 C), temperature source Oral, resp. rate 20, height 5' 7 (1.702 m), weight 184 lb 8 oz (83.7 kg), SpO2 99%.  Wt Readings from Last 3 Encounters:  03/24/23 184 lb 8 oz (83.7 kg)  03/17/23 185 lb 8 oz (84.1 kg)  02/23/23 188 lb 8 oz (85.5 kg)    Body mass index is 28.9 kg/m.  Performance status (ECOG): 0 - Asymptomatic  PHYSICAL EXAM:  Physical Exam Vitals and nursing note reviewed.  Constitutional:  General: He is not in acute distress.    Appearance: Normal appearance. He is normal weight.  HENT:     Head: Normocephalic and atraumatic.     Mouth/Throat:     Mouth: Mucous membranes are moist.     Pharynx: Oropharynx is clear. No oropharyngeal exudate, posterior oropharyngeal erythema, uvula swelling or postnasal drip.     Tonsils: No tonsillar exudate or tonsillar abscesses. 1+ on the right. 1+ on the left.  Eyes:     General: No scleral icterus.    Extraocular Movements: Extraocular movements intact.     Conjunctiva/sclera: Conjunctivae normal.     Pupils: Pupils are equal, round, and reactive to light.  Cardiovascular:     Rate and Rhythm: Normal rate and  regular rhythm.     Heart sounds: Normal heart sounds. No murmur heard.    No friction rub. No gallop.  Pulmonary:     Effort: Pulmonary effort is normal.     Breath sounds: Normal breath sounds. No wheezing, rhonchi or rales.  Abdominal:     General: Bowel sounds are normal. There is no distension.     Palpations: Abdomen is soft. There is no hepatomegaly, splenomegaly or mass.     Tenderness: There is no abdominal tenderness.  Musculoskeletal:        General: Normal range of motion.     Cervical back: Normal range of motion and neck supple. No tenderness.     Right lower leg: No edema.     Left lower leg: No edema.  Lymphadenopathy:     Cervical: No cervical adenopathy.     Upper Body:     Right upper body: No supraclavicular or axillary adenopathy.     Left upper body: No supraclavicular or axillary adenopathy.     Lower Body: No right inguinal adenopathy. No left inguinal adenopathy.  Skin:    General: Skin is warm and dry.     Coloration: Skin is not jaundiced.     Findings: No rash.  Neurological:     Mental Status: He is alert and oriented to person, place, and time.     Cranial Nerves: No cranial nerve deficit.  Psychiatric:        Mood and Affect: Mood normal.        Behavior: Behavior normal.        Thought Content: Thought content normal.     LABS:      Latest Ref Rng & Units 03/24/2023    8:28 AM 03/17/2023    9:01 AM 02/23/2023    8:21 AM  CBC  WBC 4.0 - 10.5 K/uL 7.8  12.5  10.2   Hemoglobin 13.0 - 17.0 g/dL 89.1  89.2  88.6   Hematocrit 39.0 - 52.0 % 33.4  34.2  35.3   Platelets 150 - 400 K/uL 264  216  220       Latest Ref Rng & Units 03/24/2023    8:28 AM 03/17/2023    9:01 AM 02/23/2023    8:21 AM  CMP  Glucose 70 - 99 mg/dL 874  886  869   BUN 8 - 23 mg/dL 13  16  14    Creatinine 0.61 - 1.24 mg/dL 9.13  9.15  9.12   Sodium 135 - 145 mmol/L 140  141  141   Potassium 3.5 - 5.1 mmol/L 3.2  3.5  3.7   Chloride 98 - 111 mmol/L 103  102  104   CO2 22 -  32  mmol/L 24  27  25    Calcium 8.9 - 10.3 mg/dL 9.1  9.2  9.3   Total Protein 6.5 - 8.1 g/dL 6.7  6.8  6.5   Total Bilirubin 0.0 - 1.2 mg/dL 0.5  0.3  0.4   Alkaline Phos 38 - 126 U/L 71  75  81   AST 15 - 41 U/L 29  32  26   ALT 0 - 44 U/L 17  16  18        Lab Results  Component Value Date   PSA1 1,362.0 (H) 02/01/2023    Lab Results  Component Value Date   TIBC 398 01/29/2022   FERRITIN 206 01/29/2022   IRONPCTSAT 25 01/29/2022     STUDIES:  No results found.    HISTORY:   Past Medical History:  Diagnosis Date   Hyperlipidemia    Hypokalemia 03/24/2023   Malignant neoplasm of prostate (HCC)    Supraventricular tachycardia (HCC)     Past Surgical History:  Procedure Laterality Date   CORONARY STENT INTERVENTION N/A 02/08/2022   Procedure: CORONARY STENT INTERVENTION;  Surgeon: Mady Bruckner, MD;  Location: [facility] INVASIVE CV LAB;  Service: Cardiovascular;  Laterality: N/A;   LEFT HEART CATH AND CORONARY ANGIOGRAPHY N/A 02/08/2022   Procedure: LEFT HEART CATH AND CORONARY ANGIOGRAPHY;  Surgeon: Mady Bruckner, MD;  Location: [facility] INVASIVE CV LAB;  Service: Cardiovascular;  Laterality: N/A;   PROSTATECTOMY  07/2014   TONSILLECTOMY      Family History  Problem Relation Age of Onset   Breast cancer Mother 28   Ovarian cancer Mother 70   Breast cancer Paternal Grandmother 46    Social History:  reports that he has never smoked. He has never used smokeless tobacco. He reports that he does not currently use alcohol. He reports that he does not use drugs.The patient is alone today.  Allergies:  Allergies  Allergen Reactions   Atorvastatin    Rosuvastatin Other (See Comments)    Other reaction(s): Muscle pain   Simvastatin Other (See Comments)    Other reaction(s): Joint pain, Muscle pain   Sulfa Antibiotics    Sulfamethoxazole-Trimethoprim Other (See Comments)    Unknown as to interaction was a baby.    Current Medications: Current Outpatient Medications   Medication Sig Dispense Refill   Alirocumab (PRALUENT [place]) Inject into the skin. One injection every 2 weeks Monday     APPLE CIDER VINEGAR PO Take by mouth. (Patient not taking: Reported on 01/06/2023)     Cholecalciferol 25 MCG (1000 UT) tablet Take 1,000 Units by mouth in the morning and at bedtime.     clopidogrel  (PLAVIX ) 75 MG tablet Take 75 mg by mouth daily.     diphenhydrAMINE  (BENADRYL ) 25 mg capsule Take 1 capsule by mouth at bedtime.     diphenhydrAMINE  (BENADRYL ) 50 MG capsule Take 50 mg by mouth at bedtime as needed.     ezetimibe  (ZETIA ) 10 MG tablet Take 10 mg by mouth daily.     fluconazole  (DIFLUCAN ) 100 MG tablet Take 1 tablet (100 mg total) by mouth daily. (Patient not taking: Reported on 01/06/2023) 10 tablet 0   isosorbide mononitrate (IMDUR) 30 MG 24 hr tablet Take 1 tablet by mouth every 8 (eight) hours as needed.     leuprolide  (LUPRON ) 22.5 MG injection Inject 22.5 mg into the muscle every 3 (three) months.     metoprolol  tartrate (LOPRESSOR ) 25 MG tablet Take 12.5 mg by mouth 2 (two) times daily.  morphine  (MS CONTIN ) 15 MG 12 hr tablet Take 15 mg by mouth 2 (two) times daily.     Multiple Vitamins-Minerals (MULTIVITAMIN WITH MINERALS) tablet Take 1 tablet by mouth daily.     nitroGLYCERIN  (NITROSTAT ) 0.4 MG SL tablet Place 0.4 mg under the tongue every 5 (five) minutes as needed for chest pain. Repeat every 5 minutes if needed for a total of 3 tablets in 15 minutes. If no relief, CALL 911.     Omega-3 Fatty Acids (FISH OIL) 1000 MG CAPS Take 2,000 mg by mouth in the morning and at bedtime.     ondansetron  (ZOFRAN ) 8 MG tablet Take 8 mg by mouth every 8 (eight) hours as needed for nausea or vomiting.     oxyCODONE  (OXYCONTIN ) 10 mg 12 hr tablet Take 1 tablet (10 mg total) by mouth every 12 (twelve) hours. 60 tablet 0   polyethylene glycol powder (GLYCOLAX/MIRALAX) 17 GM/SCOOP powder Take 1 Container by mouth once.     predniSONE  (DELTASONE ) 5 MG tablet Take 1 tablet  (5 mg total) by mouth 2 (two) times daily with a meal. 60 tablet 5   prochlorperazine  (COMPAZINE ) 10 MG tablet Take 10 mg by mouth every 6 (six) hours as needed for nausea or vomiting.     Royal Jelly-Bee Pollen-Ginseng (KOREAN GINSENG COMPLEX) 150-250-50 MG CAPS Take 1 capsule by mouth daily.     triamcinolone  (KENALOG ) 0.025 % cream Apply 1 Application topically 2 (two) times daily. 30 g 5   Zoledronic  Acid (ZOMETA ) 4 MG/100ML IVPB Inject 4 mg into the vein every 3 (three) months.     No current facility-administered medications for this visit.   Facility-Administered Medications Ordered in Other Visits  Medication Dose Route Frequency Provider Last Rate Last Admin   cabazitaxel  (JEVTANA ) 41 mg in sodium chloride  0.9 % 250 mL chemo infusion  20 mg/m2 (Treatment Plan Recorded) Intravenous Once Cornelius Wanda DEL, MD       heparin  lock flush 100 unit/mL  500 Units Intracatheter Once PRN Cornelius Wanda DEL, MD       potassium chloride  10 mEq in 100 mL IVPB  10 mEq Intravenous Once Halen Mossbarger A, PA-C       potassium chloride  10 mEq in 100 mL IVPB  10 mEq Intravenous Once Augustus Zurawski A, PA-C       sodium chloride  flush (NS) 0.9 % injection 10 mL  10 mL Intravenous PRN Cornelius Wanda DEL, MD   10 mL at 03/17/23 0953   sodium chloride  flush (NS) 0.9 % injection 10 mL  10 mL Intracatheter PRN Cornelius Wanda DEL, MD

## 2023-03-24 ENCOUNTER — Telehealth: Payer: Self-pay | Admitting: Hematology and Oncology

## 2023-03-24 ENCOUNTER — Encounter: Payer: Self-pay | Admitting: Hematology and Oncology

## 2023-03-24 ENCOUNTER — Other Ambulatory Visit: Payer: Self-pay | Admitting: Pharmacist

## 2023-03-24 ENCOUNTER — Inpatient Hospital Stay: Payer: No Typology Code available for payment source

## 2023-03-24 ENCOUNTER — Encounter: Payer: Self-pay | Admitting: Oncology

## 2023-03-24 ENCOUNTER — Inpatient Hospital Stay (HOSPITAL_BASED_OUTPATIENT_CLINIC_OR_DEPARTMENT_OTHER): Payer: No Typology Code available for payment source | Admitting: Hematology and Oncology

## 2023-03-24 VITALS — BP 106/61 | HR 70 | Temp 98.5°F | Resp 20 | Ht 67.0 in | Wt 184.5 lb

## 2023-03-24 DIAGNOSIS — C61 Malignant neoplasm of prostate: Secondary | ICD-10-CM

## 2023-03-24 DIAGNOSIS — Z5111 Encounter for antineoplastic chemotherapy: Secondary | ICD-10-CM | POA: Diagnosis not present

## 2023-03-24 DIAGNOSIS — C7951 Secondary malignant neoplasm of bone: Secondary | ICD-10-CM

## 2023-03-24 DIAGNOSIS — E876 Hypokalemia: Secondary | ICD-10-CM | POA: Diagnosis not present

## 2023-03-24 DIAGNOSIS — C7952 Secondary malignant neoplasm of bone marrow: Secondary | ICD-10-CM | POA: Diagnosis not present

## 2023-03-24 HISTORY — DX: Hypokalemia: E87.6

## 2023-03-24 LAB — CBC WITH DIFFERENTIAL (CANCER CENTER ONLY)
Abs Immature Granulocytes: 0.05 10*3/uL (ref 0.00–0.07)
Basophils Absolute: 0 10*3/uL (ref 0.0–0.1)
Basophils Relative: 1 %
Eosinophils Absolute: 0.1 10*3/uL (ref 0.0–0.5)
Eosinophils Relative: 2 %
HCT: 33.4 % — ABNORMAL LOW (ref 39.0–52.0)
Hemoglobin: 10.8 g/dL — ABNORMAL LOW (ref 13.0–17.0)
Immature Granulocytes: 1 %
Lymphocytes Relative: 37 %
Lymphs Abs: 2.9 10*3/uL (ref 0.7–4.0)
MCH: 29.9 pg (ref 26.0–34.0)
MCHC: 32.3 g/dL (ref 30.0–36.0)
MCV: 92.5 fL (ref 80.0–100.0)
Monocytes Absolute: 0.6 10*3/uL (ref 0.1–1.0)
Monocytes Relative: 8 %
Neutro Abs: 4.1 10*3/uL (ref 1.7–7.7)
Neutrophils Relative %: 51 %
Platelet Count: 264 10*3/uL (ref 150–400)
RBC: 3.61 MIL/uL — ABNORMAL LOW (ref 4.22–5.81)
RDW: 16.9 % — ABNORMAL HIGH (ref 11.5–15.5)
WBC Count: 7.8 10*3/uL (ref 4.0–10.5)
nRBC: 0 % (ref 0.0–0.2)
nRBC: 0 /100{WBCs}

## 2023-03-24 LAB — CMP (CANCER CENTER ONLY)
ALT: 17 U/L (ref 0–44)
AST: 29 U/L (ref 15–41)
Albumin: 4.2 g/dL (ref 3.5–5.0)
Alkaline Phosphatase: 71 U/L (ref 38–126)
Anion gap: 13 (ref 5–15)
BUN: 13 mg/dL (ref 8–23)
CO2: 24 mmol/L (ref 22–32)
Calcium: 9.1 mg/dL (ref 8.9–10.3)
Chloride: 103 mmol/L (ref 98–111)
Creatinine: 0.86 mg/dL (ref 0.61–1.24)
GFR, Estimated: >60 mL/min (ref >60)
Glucose, Bld: 125 mg/dL — ABNORMAL HIGH (ref 70–99)
Potassium: 3.2 mmol/L — ABNORMAL LOW (ref 3.5–5.1)
Sodium: 140 mmol/L (ref 135–145)
Total Bilirubin: 0.5 mg/dL (ref 0.0–1.2)
Total Protein: 6.7 g/dL (ref 6.5–8.1)

## 2023-03-24 MED ORDER — FAMOTIDINE IN NACL 20-0.9 MG/50ML-% IV SOLN
20.0000 mg | Freq: Once | INTRAVENOUS | Status: AC
  Administered 2023-03-24: 20 mg via INTRAVENOUS
  Filled 2023-03-24: qty 50

## 2023-03-24 MED ORDER — HEPARIN SOD (PORK) LOCK FLUSH 100 UNIT/ML IV SOLN
500.0000 [IU] | Freq: Once | INTRAVENOUS | Status: AC | PRN
  Administered 2023-03-24: 500 [IU]

## 2023-03-24 MED ORDER — SODIUM CHLORIDE 0.9% FLUSH
10.0000 mL | INTRAVENOUS | Status: DC | PRN
  Administered 2023-03-24: 10 mL

## 2023-03-24 MED ORDER — SODIUM CHLORIDE 0.9 % IV SOLN
20.0000 mg/m2 | Freq: Once | INTRAVENOUS | Status: AC
Start: 2023-03-24 — End: 2023-03-24
  Administered 2023-03-24: 41 mg via INTRAVENOUS
  Filled 2023-03-24: qty 4.1

## 2023-03-24 MED ORDER — DIPHENHYDRAMINE HCL 50 MG/ML IJ SOLN
25.0000 mg | Freq: Once | INTRAMUSCULAR | Status: AC
  Administered 2023-03-24: 25 mg via INTRAVENOUS
  Filled 2023-03-24: qty 1

## 2023-03-24 MED ORDER — SODIUM CHLORIDE 0.9 % IV SOLN: Freq: Once | INTRAVENOUS | Status: AC

## 2023-03-24 MED ORDER — POTASSIUM CHLORIDE 10 MEQ/100ML IV SOLN
10.0000 meq | Freq: Once | INTRAVENOUS | Status: AC
Start: 2023-03-24 — End: 2023-03-24
  Administered 2023-03-24: 10 meq via INTRAVENOUS
  Filled 2023-03-24: qty 100

## 2023-03-24 MED ORDER — DEXAMETHASONE SODIUM PHOSPHATE 10 MG/ML IJ SOLN
10.0000 mg | Freq: Once | INTRAMUSCULAR | Status: AC
  Administered 2023-03-24: 10 mg via INTRAVENOUS
  Filled 2023-03-24: qty 1

## 2023-03-24 MED ORDER — POTASSIUM CHLORIDE 10 MEQ/100ML IV SOLN
10.0000 meq | Freq: Once | INTRAVENOUS | Status: AC
  Administered 2023-03-24: 10 meq via INTRAVENOUS
  Filled 2023-03-24: qty 100

## 2023-03-24 NOTE — Patient Instructions (Addendum)
 Cabazitaxel  Injection What is this medication? CABAZITAXEL  (ka BAZ i TAX el) treats prostate cancer. It works by slowing down the growth of cancer cells. This medicine may be used for other purposes; ask your health care provider or pharmacist if you have questions. COMMON BRAND NAME(S): Jevtana  What should I tell my care team before I take this medication? They need to know if you have any of these conditions: Kidney problems Liver disease Low white blood cell levels Lung disease Stomach or intestine problems An unusual or allergic reaction to cabazitaxel , polysorbate 80, other medications, foods, dyes, or preservatives Pregnant or trying to get pregnant Breast-feeding How should I use this medication? This medication is injected into a vein. It is given by your care team in a hospital or clinic setting. Talk to your care team about the use of this medication in children. Special care may be needed. Overdosage: If you think you have taken too much of this medicine contact a poison control center or emergency room at once. NOTE: This medicine is only for you. Do not share this medicine with others. What if I miss a dose? Keep appointments for follow-up doses. It is important not to miss your dose. Call your care team if you are unable to keep an appointment. What may interact with this medication? Certain antibiotics, such as clarithromycin or telithromycin Certain antivirals for HIV or AIDS Certain medications for fungal infections like ketoconazole , itraconazole, and voriconazole Nefazodone This list may not describe all possible interactions. Give your health care provider a list of all the medicines, herbs, non-prescription drugs, or dietary supplements you use. Also tell them if you smoke, drink alcohol, or use illegal drugs. Some items may interact with your medicine. What should I watch for while using this medication? This medication may make you feel generally unwell. This is  not uncommon as chemotherapy can affect healthy cells as well as cancer cells. Report any side effects. Continue your course of treatment even though you feel ill unless your care team tells you to stop. You may need blood work while you are taking this medication. This medication may increase your risk of getting an infection. Call your care team for advice if you get a fever, chills, sore throat, or other symptoms of a cold or flu. Do not treat yourself. Try to avoid being around people who are sick. Avoid taking medications that contain aspirin , acetaminophen , ibuprofen, naproxen, or ketoprofen unless instructed by your care team. These medications may hide a fever. Be careful brushing or flossing your teeth or using a toothpick because you may get an infection or bleed more easily. If you have any dental work done, tell your dentist you are receiving this medication. This medication can cause serious infusion reactions. To reduce the risk, your care team may give you other medications to take before receiving this one. Be sure to follow the directions from your care team. Use a condom during sex while taking this medication and for 4 months after the last dose. Talk to your care team right away if your partner may be pregnant. This medication can cause serious birth defects. This medication may cause infertility. Talk to your care team if you are concerned about your fertility. What side effects may I notice from receiving this medication? Side effects that you should report to your care team as soon as possible: Allergic reactions--skin rash, itching, hives, swelling of the face, lips, tongue, or throat Diarrhea, nausea, vomiting Dry cough, shortness of breath or  trouble breathing Infection--fever, chills, cough, or sore throat Kidney injury--decrease in the amount of urine, swelling of the ankles, hands, or feet Pain, tingling, or numbness in the hands or feet Red or dark brown urine Stomach  bleeding--bloody or black, tar-like stools, vomiting blood or brown material that looks like coffee grounds Stomach pain that is severe, does not away, or gets worse Unusual bruising or bleeding Side effects that usually do not require medical attention (report these to your care team if they continue or are bothersome): Loss of appetite Unusual weakness or fatigue This list may not describe all possible side effects. Call your doctor for medical advice about side effects. You may report side effects to FDA at 1-800-FDA-1088. Where should I keep my medication? This medication is given in a hospital or clinic. It will not be stored at home. NOTE: This sheet is a summary. It may not cover all possible information. If you have questions about this medicine, talk to your doctor, pharmacist, or health care provider.  2024 Elsevier/Gold Standard (2022-03-23 00:00:00) Potassium Chloride  Injection What is this medication? POTASSIUM CHLORIDE  (poe TASS i um KLOOR ide) prevents and treats low levels of potassium in your body. Potassium plays an important role in maintaining the health of your kidneys, heart, muscles, and nervous system. This medicine may be used for other purposes; ask your health care provider or pharmacist if you have questions. COMMON BRAND NAME(S): PROAMP What should I tell my care team before I take this medication? They need to know if you have any of these conditions: Addison disease Dehydration Diabetes (high blood sugar) Heart disease High levels of potassium in the blood Irregular heartbeat or rhythm Kidney disease Large areas of burned skin An unusual or allergic reaction to potassium, other medications, foods, dyes, or preservatives Pregnant or trying to get pregnant Breast-feeding How should I use this medication? This medication is injected into a vein. It is given in a hospital or clinic setting. Talk to your care team about the use of this medication in children.  Special care may be needed. Overdosage: If you think you have taken too much of this medicine contact a poison control center or emergency room at once. NOTE: This medicine is only for you. Do not share this medicine with others. What if I miss a dose? This does not apply. This medication is not for regular use. What may interact with this medication? Do not take this medication with any of the following: Certain diuretics, such as spironolactone, triamterene Eplerenone Sodium polystyrene sulfonate This medication may also interact with the following: Certain medications for blood pressure or heart disease, such as lisinopril, losartan, quinapril, valsartan Medications that lower your chance of fighting infection, such as cyclosporine, tacrolimus NSAIDs, medications for pain and inflammation, such as ibuprofen or naproxen Other potassium supplements Salt substitutes This list may not describe all possible interactions. Give your health care provider a list of all the medicines, herbs, non-prescription drugs, or dietary supplements you use. Also tell them if you smoke, drink alcohol, or use illegal drugs. Some items may interact with your medicine. What should I watch for while using this medication? Visit your care team for regular checks on your progress. Tell your care team if your symptoms do not start to get better or if they get worse. You may need blood work while you are taking this medication. Avoid salt substitutes unless you are told otherwise by your care team. What side effects may I notice from receiving this  medication? Side effects that you should report to your care team as soon as possible: Allergic reactions--skin rash, itching, hives, swelling of the face, lips, tongue, or throat High potassium level--muscle weakness, fast or irregular heartbeat Side effects that usually do not require medical attention (report to your care team if they continue or are  bothersome): Diarrhea Nausea Stomach pain Vomiting This list may not describe all possible side effects. Call your doctor for medical advice about side effects. You may report side effects to FDA at 1-800-FDA-1088. Where should I keep my medication? This medication is given in a hospital or clinic. It will not be stored at home. NOTE: This sheet is a summary. It may not cover all possible information. If you have questions about this medicine, talk to your doctor, pharmacist, or health care provider.  2024 Elsevier/Gold Standard (2021-09-11 00:00:00)

## 2023-03-24 NOTE — Assessment & Plan Note (Signed)
 New mild hypokalemia. I will give him IV potassium today and continue to monitor.

## 2023-03-24 NOTE — Assessment & Plan Note (Addendum)
 Stage IVA prostate cancer diagnosed in January 2016.  He had progressive disease in the bone and lung in September 2023, so was placed on docetaxel  chemotherapy and continued leuprolide  and zoledronic  acid.  His PSA went down from 536 to 400.75 after 3 cycles, then to 284 with the 5th cycle, and then 203 in March 2024. PSA then went up to 261 in May. We decided to give him a break from chemotherapy at that time.  We continued leuprolide  and zolendronic acid every 3 months. The PSA was 1156. PSMA PET in September revealed progression of skeletal metastasis. Multiple new radiotracer avid lesions within the spine. Individual lesions are increased in size compared to prior accompanied by sclerotic CT change. 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 No evidence of nodal metastasis or visceral metastasis.   He is receiving palliative chemotherapy with cabazitaxel /prednisone . His PSA was up to 1608 prior to starting was down to 1317 prior to a 2nd cycle. PSA on January 2 was down to 741 from 843, consistent with a response to therapy.  He has recovered from his recent bronchitis.  He will proceed with a 5th cycle today. We will plan to see him back in 3 weeks prior to a 6th cycle of cabazitaxel /prednisone .

## 2023-03-24 NOTE — Telephone Encounter (Signed)
 Patient has been scheduled. Aware of appt date and time.      Scheduling Message Entered by BERKELEY SPANNER A on 03/24/2023 at  9:20 AM Priority: High <No visit type provided>  Department: [facility]  Provider:  Scheduling Notes:  1. Please schedule her for B12 weekly x 4 weeks, then 4 weeks later  2. Schedule labs and follow up with 1st monthly B12, thanks

## 2023-03-24 NOTE — Assessment & Plan Note (Signed)
 He continues zoledronic  acid every 3 months in addition to his cancer therapy. He had marked progression of his bone metastases in August 2023, but minimal symptoms. He received docetaxel  chemotherapy with a decrease in the PSA. Docetaxel  was discontinued in May 2024.  He then had a significant increase in his PSA and progression of the bone metastases.  PET scan revealed multiple new lesions of the spine compared to August 2023, including T12-L1 and L2, sacrum, multiple rib lesions, multiple scapular lesions and pelvic lesions.  Other lesions so increased in size.  He has widespread skeletal metastases, but no evidence of nodal or visceral metastasis.  He continues zoledronic  acid and leuprolide  every 3 months and he will proceed with those today.  He is also undergoing chemotherapy with cabazitaxel /prednisone .  His PSA has steadily decreased, consistent with a response to therapy.

## 2023-03-24 NOTE — Telephone Encounter (Signed)
 Patient has been scheduled for follow-up visit per 03/24/23 LOS.  Pt given an appt calendar with date and time.

## 2023-03-25 ENCOUNTER — Other Ambulatory Visit: Payer: Self-pay

## 2023-03-25 ENCOUNTER — Inpatient Hospital Stay: Payer: No Typology Code available for payment source

## 2023-03-25 VITALS — BP 119/67 | HR 68 | Temp 98.0°F | Resp 18 | Ht 67.0 in | Wt 188.1 lb

## 2023-03-25 DIAGNOSIS — C7951 Secondary malignant neoplasm of bone: Secondary | ICD-10-CM

## 2023-03-25 DIAGNOSIS — C61 Malignant neoplasm of prostate: Secondary | ICD-10-CM

## 2023-03-25 DIAGNOSIS — Z5111 Encounter for antineoplastic chemotherapy: Secondary | ICD-10-CM | POA: Diagnosis not present

## 2023-03-25 MED ORDER — PEGFILGRASTIM-FPGK 6 MG/0.6ML ~~LOC~~ SOSY
6.0000 mg | PREFILLED_SYRINGE | Freq: Once | SUBCUTANEOUS | Status: AC
Start: 2023-03-25 — End: 2023-03-25
  Administered 2023-03-25: 6 mg via SUBCUTANEOUS
  Filled 2023-03-25: qty 0.6

## 2023-03-25 NOTE — Patient Instructions (Signed)

## 2023-03-28 ENCOUNTER — Telehealth: Payer: Self-pay

## 2023-03-28 NOTE — Telephone Encounter (Signed)
-----   Message from Marvin Johnson sent at 03/22/2023  6:08 PM EST ----- Regarding: call Tell him the PSA went down another 100 points to 741, very good

## 2023-03-28 NOTE — Telephone Encounter (Signed)
 Called patient and notified him of the message

## 2023-03-29 ENCOUNTER — Other Ambulatory Visit: Payer: Self-pay | Admitting: Hematology and Oncology

## 2023-03-29 MED ORDER — MORPHINE SULFATE ER 15 MG PO TBCR: 15.0000 mg | EXTENDED_RELEASE_TABLET | Freq: Two times a day (BID) | ORAL | 0 refills | Status: DC

## 2023-03-31 ENCOUNTER — Ambulatory Visit: Payer: No Typology Code available for payment source

## 2023-04-07 ENCOUNTER — Ambulatory Visit: Payer: No Typology Code available for payment source

## 2023-04-12 NOTE — Progress Notes (Signed)
[facility]  [address] [place],  [place]  [address] [phone]  Clinic Day: 04/13/23  Referring physician: [name], MD  ASSESSMENT & PLAN:  Assessment: Malignant neoplasm of prostate [facility]) Stage IVA prostate cancer diagnosed in January 2016.  He had progressive disease in the bone and lung in September 2023, so was placed on docetaxel chemotherapy and continued leuprolide and zoledronic acid.  His PSA went down from 536 to 400.75 after 3 cycles, then to 284 with the 5th cycle, and then 203 in March 2024. PSA then went up to 261 in May. We decided to give him a break from chemotherapy at that time.  We continued leuprolide and zolendronic acid every 3 months. The PSA was 1156. PSMA PET in September of 2024 revealed progression of skeletal metastasis. Multiple new radiotracer avid lesions within the spine. Individual lesions are increased in size compared to prior accompanied by sclerotic CT change. 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 No evidence of nodal metastasis or visceral metastasis. He is receiving palliative chemotherapy with cabazitaxel/prednisone since October of 2024. His PSA was up to 1608 prior to starting and went down to 1317 prior to a 2nd cycle and 843 prior to his 4th cycle. PSA is pending from today. He continues to tolerate treatment fairly well.    Secondary malignant neoplasm of bone and bone marrow (HCC) He continues zoledronic acid every 3 months in addition to his cancer therapy. He had marked progression of his bone metastases in August 2023, but minimal symptoms. He received docetaxel chemotherapy with a decrease in the PSA. Docetaxel was discontinued in May 2024.  He then had a significant increase in his PSA and progression of the bone metastases.  PET scan revealed multiple new lesions of the spine compared to August 2023, including T12-L1 and L2, sacrum, multiple rib lesions, multiple scapular lesions and  pelvic lesions.  Other lesions so increased in size.  He has widespread skeletal metastases, but no evidence of nodal or visceral metastasis.  He continues zoledronic acid and leuprolide every 3 months. He is also undergoing chemotherapy with cabazitaxel/prednisone since October of 2024.   Anemia He had an upper GI hemorrhage last year and was referred to Dr. [name]. Fortunately, he just had the one episode and was transfused. His hemoglobin came up to 11.1 and his hemoglobin remained stable at 11.3. He had a EGD scheduled in April, but it was canceled. His cardiologist Dr. Allyson Sabal was not comfortable with him stopping clopidogrel for another year. His bleeding stopped and we decided not pursue an EGD at this time unless he begins having melena again and/or a significant drop in his hemoglobin.  His hemoglobin is stable. He denies obvious blood loss. His anemia is a little better today at 11.2.  We will continue to monitor this.  Hypokalemia New mild hypokalemia. I gave him IV potassium and it did show improvement but it remains low. I will therefore start him on oral supplement of daily. We will continue to monitor.  Plan: He went to urgent care over the weekend on 04/09/2023 and they placed him on doxycycline and cough syrup with codeine, he notes that his symptoms are improving now. He has a elevated WBC of 11.1, hemoglobin of 11.2 up from 10.8, and platelet count of 204,000. His CMP is normal other than a low potassium of 3.4, improved from 3.2. He does not take any potassium supplement at this time. I am not sure why he has  hypokalemia as he is not on a diuretic, it could possibly be related to his prednisone. I will place him on a oral potassium supplement 10 meq once daily. His last PSA dropped from 843.77 to 741.46 and I will add another to his labs today. His day 1 cycle 6 of Jevtana is scheduled on 04/13/2023. He continues Prednisone 5 mg BID. I will see him back in 3 weeks with CBC, CMP,  and PSA. The patient understands the plans discussed today and is in agreement with them.  He knows to contact our office if he develops concerns prior to his next appointment.  I provided 30 minutes of face-to-face time during this encounter and > 50% was spent counseling as documented under my assessment and plan.   [name], MD  [name] - A DEPT OF [name] [address] [place] [address] Dept: [phone] Dept Fax: [phone]   No orders of the defined types were placed in this encounter.   CHIEF COMPLAINT:  CC: Stage IVA prostate cancer   Current Treatment: Cabazitaxel/prednisone  HISTORY OF PRESENT ILLNESS:   Oncology History  Malignant neoplasm of prostate (HCC)  03/30/2014 Cancer Staging   Staging form: Prostate, AJCC 8th Edition - Clinical stage from 03/30/2014: Stage IVA (cT3b, cN1, cM0, PSA: 51.8, Grade Group: 4) - Signed by [name], MD on 11/14/2020 Histopathologic type: Adenocarcinoma, NOS Stage prefix: Initial diagnosis Prostate specific antigen (PSA) range: 20 or greater Gleason primary pattern: 4 Gleason secondary pattern: 4 Gleason score: 8 Histologic grading system: 5 grade system Location of positive needle core biopsies: Beyond primary site Prognostic indicators: May 2016 Robotic prostatectomy with mult. Pos margins, extra prostatic extension, 2/6 pos nodes. Scan June 2016 positive at T10  PSA up to 216.3 by August Placed on lupron and casodex Stage used in treatment planning: Yes National guidelines used in treatment planning: Yes Type of national guideline used in treatment planning: NCCN   02/18/2020 Initial Diagnosis   Malignant neoplasm of prostate (HCC)   12/01/2021 - 07/19/2022 Chemotherapy   Patient is on Treatment Plan : PROSTATE Docetaxel (75) + Prednisone q21d     12/21/2022 -  Chemotherapy   Patient is on Treatment Plan : PROSTATE Cabazitaxel (20) D1 +  Prednisone D1-21 q21d     Secondary malignant neoplasm of bone and bone marrow (HCC)  02/18/2020 Initial Diagnosis   Secondary malignant neoplasm of bone and bone marrow (HCC)   12/01/2021 - 07/19/2022 Chemotherapy   Patient is on Treatment Plan : PROSTATE Docetaxel (75) + Prednisone q21d     12/21/2022 -  Chemotherapy   Patient is on Treatment Plan : PROSTATE Cabazitaxel (20) D1 + Prednisone D1-21 q21d     Malignant neoplasm of prostate metastatic to lung (HCC)  11/25/2021 Initial Diagnosis   Malignant neoplasm of prostate metastatic to lung (HCC)   12/21/2022 -  Chemotherapy   Patient is on Treatment Plan : PROSTATE Cabazitaxel (20) D1 + Prednisone D1-21 q21d         INTERVAL HISTORY:  Marvin Johnson is here today for repeat clinical assessment for his stage IVA prostate cancer. Patient states that he feels well and has no complaints of pain. He informed me that he still has an respiratory infection after completing a course of Z-pack from last visit. He went to Urgent care over the weekend on 04/09/2023 and they placed him on doxycycline and cough syrup with codeine, he notes that his  symptoms are improving now. He has an elevated WBC of 11.1, hemoglobin of 11.2 up from 10.8, and platelet count of 204,000. His CMP is normal other than a low potassium of 3.4 improved from 3.2. He does not take any potassium supplement at this time. I am not sure why he has hypokalemia as he is not on a diuretic, it could possibly be related to his prednisone. I will place him on a oral potassium supplement 10 meq once daily. His last PSA dropped from 843.77 to 741.46 and I will add one to his labs today. His day 1 cycle 6 of Jevtana is scheduled on 04/13/2023. He continues Prednisone 5 mg BID. I will see him back in 3 weeks with CBC, CMP, and PSA. He denies signs of infection such as sore throat or urinary symptoms.  He denies fevers or recurrent chills. He denies pain. He denies nausea, vomiting, chest pain, dyspnea. His  appetite is good and his weight has increased 3 pounds over last 3 weeks .  REVIEW OF SYSTEMS:  Review of Systems  Constitutional: Negative.  Negative for appetite change, chills, diaphoresis, fatigue, fever and unexpected weight change.  HENT:  Negative.  Negative for hearing loss, lump/mass, mouth sores, nosebleeds, sore throat, tinnitus, trouble swallowing and voice change.   Eyes: Negative.  Negative for eye problems and icterus.  Respiratory:  Positive for cough (productive of clear sputum). Negative for chest tightness, hemoptysis, shortness of breath and wheezing.   Cardiovascular: Negative.  Negative for chest pain, leg swelling and palpitations.  Gastrointestinal: Negative.  Negative for abdominal distention, abdominal pain, blood in stool, constipation, diarrhea, nausea, rectal pain and vomiting.  Endocrine: Negative.  Negative for hot flashes.  Genitourinary: Negative.  Negative for bladder incontinence, difficulty urinating, dyspareunia, dysuria, frequency, hematuria, nocturia, pelvic pain and penile discharge.   Musculoskeletal: Negative.  Negative for arthralgias, back pain, flank pain, gait problem, myalgias, neck pain and neck stiffness.  Skin: Negative.  Negative for itching, rash and wound.  Neurological: Negative.  Negative for dizziness, extremity weakness, gait problem, headaches, light-headedness, numbness, seizures and speech difficulty.  Hematological:  Negative for adenopathy. Bruises/bleeds easily (easy bruising).  Psychiatric/Behavioral: Negative.  Negative for confusion, decreased concentration, depression, sleep disturbance and suicidal ideas. The patient is not nervous/anxious.      VITALS:  Blood pressure 122/68, pulse 61, temperature 97.8 F (36.6 C), temperature source Oral, resp. rate 18, height 5\' 7"  (1.702 m), weight 187 lb 12.8 oz (85.2 kg), SpO2 98%.  Wt Readings from Last 3 Encounters:  04/13/23 187 lb 12.8 oz (85.2 kg)  03/25/23 188 lb 1.3 oz (85.3 kg)   03/24/23 184 lb 8 oz (83.7 kg)    Body mass index is 29.41 kg/m.  Performance status (ECOG): 0 - Asymptomatic  PHYSICAL EXAM:  Physical Exam Vitals and nursing note reviewed.  Constitutional:      General: He is not in acute distress.    Appearance: Normal appearance. He is normal weight. He is not ill-appearing, toxic-appearing or diaphoretic.  HENT:     Head: Normocephalic and atraumatic.     Right Ear: Tympanic membrane, ear canal and external ear normal. There is no impacted cerumen.     Left Ear: Tympanic membrane, ear canal and external ear normal. There is no impacted cerumen.     Nose: Nose normal. No congestion or rhinorrhea.     Mouth/Throat:     Mouth: Mucous membranes are moist.     Pharynx: Oropharynx is clear. No oropharyngeal  exudate, posterior oropharyngeal erythema, uvula swelling or postnasal drip.     Tonsils: No tonsillar exudate or tonsillar abscesses.  Eyes:     General: No scleral icterus.       Right eye: No discharge.        Left eye: No discharge.     Extraocular Movements: Extraocular movements intact.     Conjunctiva/sclera: Conjunctivae normal.     Pupils: Pupils are equal, round, and reactive to light.  Neck:     Vascular: No carotid bruit.  Cardiovascular:     Rate and Rhythm: Normal rate and regular rhythm.     Pulses: Normal pulses.     Heart sounds: Normal heart sounds. No murmur heard.    No friction rub. No gallop.  Pulmonary:     Effort: Pulmonary effort is normal. No respiratory distress.     Breath sounds: Normal breath sounds. No stridor. No wheezing, rhonchi or rales.  Chest:     Chest wall: No tenderness.  Abdominal:     General: Bowel sounds are normal. There is no distension.     Palpations: Abdomen is soft. There is no hepatomegaly, splenomegaly or mass.     Tenderness: There is no abdominal tenderness. There is no right CVA tenderness, left CVA tenderness, guarding or rebound.     Hernia: No hernia is present.   Musculoskeletal:        General: No swelling, tenderness, deformity or signs of injury. Normal range of motion.     Cervical back: Normal range of motion and neck supple. No rigidity or tenderness.     Right lower leg: No edema.     Left lower leg: No edema.  Lymphadenopathy:     Cervical: No cervical adenopathy.     Upper Body:     Right upper body: No supraclavicular or axillary adenopathy.     Left upper body: No supraclavicular or axillary adenopathy.     Lower Body: No right inguinal adenopathy. No left inguinal adenopathy.  Skin:    General: Skin is warm and dry.     Coloration: Skin is not jaundiced or pale.     Findings: No bruising, erythema, lesion or rash.  Neurological:     General: No focal deficit present.     Mental Status: He is alert and oriented to person, place, and time. Mental status is at baseline.     Cranial Nerves: No cranial nerve deficit.     Sensory: No sensory deficit.     Motor: No weakness.     Coordination: Coordination normal.     Gait: Gait normal.     Deep Tendon Reflexes: Reflexes normal.  Psychiatric:        Mood and Affect: Mood normal.        Behavior: Behavior normal.        Thought Content: Thought content normal.        Judgment: Judgment normal.     LABS:      Latest Ref Rng & Units 04/13/2023    8:28 AM 03/24/2023    8:28 AM 03/17/2023    9:01 AM  CBC  WBC 4.0 - 10.5 K/uL 11.1  7.8  12.5   Hemoglobin 13.0 - 17.0 g/dL 78.2  95.6  21.3   Hematocrit 39.0 - 52.0 % 35.8  33.4  34.2   Platelets 150 - 400 K/uL 204  264  216       Latest Ref Rng & Units 04/13/2023  8:28 AM 03/24/2023    8:28 AM 03/17/2023    9:01 AM  CMP  Glucose 70 - 99 mg/dL 147  829  562   BUN 8 - 23 mg/dL 17  13  16    Creatinine 0.61 - 1.24 mg/dL 1.30  8.65  7.84   Sodium 135 - 145 mmol/L 142  140  141   Potassium 3.5 - 5.1 mmol/L 3.4  3.2  3.5   Chloride 98 - 111 mmol/L 105  103  102   CO2 22 - 32 mmol/L 26  24  27    Calcium 8.9 - 10.3 mg/dL 9.8  9.1  9.2    Total Protein 6.5 - 8.1 g/dL 6.6  6.7  6.8   Total Bilirubin 0.0 - 1.2 mg/dL 0.3  0.5  0.3   Alkaline Phos 38 - 126 U/L 77  71  75   AST 15 - 41 U/L 23  29  32   ALT 0 - 44 U/L 15  17  16     Lab Results  Component Value Date   PSA1 1,362.0 (H) 02/01/2023   Lab Results  Component Value Date   TIBC 398 01/29/2022   FERRITIN 206 01/29/2022   IRONPCTSAT 25 01/29/2022   STUDIES:  EXAM: 12/06/2022 NUCLEAR MEDICINE PET SKULL BASE TO THIGH IMPRESSION: 1. Progression of skeletal metastasis. Multiple new radiotracer avid lesions within the spine. 2. Individual lesions are increased in size compared to prior accompanied by sclerotic CT change. 3. 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 4. No evidence of nodal metastasis or visceral metastasis    HISTORY:   Past Medical History:  Diagnosis Date   Hyperlipidemia    Hypokalemia 03/24/2023   Malignant neoplasm of prostate (HCC)    Supraventricular tachycardia (HCC)     Past Surgical History:  Procedure Laterality Date   CORONARY STENT INTERVENTION N/A 02/08/2022   Procedure: CORONARY STENT INTERVENTION;  Surgeon: Yvonne Kendall, MD;  Location: [facility] INVASIVE CV LAB;  Service: Cardiovascular;  Laterality: N/A;   LEFT HEART CATH AND CORONARY ANGIOGRAPHY N/A 02/08/2022   Procedure: LEFT HEART CATH AND CORONARY ANGIOGRAPHY;  Surgeon: Yvonne Kendall, MD;  Location: [facility] INVASIVE CV LAB;  Service: Cardiovascular;  Laterality: N/A;   PROSTATECTOMY  07/2014   TONSILLECTOMY      Family History  Problem Relation Age of Onset   Breast cancer Mother 79   Ovarian cancer Mother 85   Breast cancer Paternal Grandmother 1    Social History:  reports that he has never smoked. He has never used smokeless tobacco. He reports that he does not currently use alcohol. He reports that he does not use drugs.The patient is alone today.  Allergies:  Allergies  Allergen Reactions   Atorvastatin    Rosuvastatin Other (See  Comments)    Other reaction(s): Muscle pain   Simvastatin Other (See Comments)    Other reaction(s): Joint pain, Muscle pain   Sulfa Antibiotics    Sulfamethoxazole-Trimethoprim Other (See Comments)    Unknown as to interaction was a baby.    Current Medications: Current Outpatient Medications  Medication Sig Dispense Refill   guaiFENesin-codeine 100-10 MG/5ML syrup Take 5-10 mLs by mouth every 4 (four) hours as needed.     Alirocumab (PRALUENT [place]) Inject into the skin. One injection every 2 weeks Monday     APPLE CIDER VINEGAR PO Take by mouth. (Patient not taking: Reported on 01/06/2023)     Cholecalciferol 25 MCG (1000 UT) tablet Take 1,000 Units by  mouth in the morning and at bedtime.     clopidogrel (PLAVIX) 75 MG tablet Take 75 mg by mouth daily.     diphenhydrAMINE (BENADRYL) 25 mg capsule Take 1 capsule by mouth at bedtime.     diphenhydrAMINE (BENADRYL) 50 MG capsule Take 50 mg by mouth at bedtime as needed.     ezetimibe (ZETIA) 10 MG tablet Take 10 mg by mouth daily.     fluconazole (DIFLUCAN) 100 MG tablet Take 1 tablet (100 mg total) by mouth daily. (Patient not taking: Reported on 01/06/2023) 10 tablet 0   isosorbide mononitrate (IMDUR) 30 MG 24 hr tablet Take 1 tablet by mouth every 8 (eight) hours as needed.     leuprolide (LUPRON) 22.5 MG injection Inject 22.5 mg into the muscle every 3 (three) months.     metoprolol tartrate (LOPRESSOR) 25 MG tablet Take 12.5 mg by mouth 2 (two) times daily.     morphine (MS CONTIN) 15 MG 12 hr tablet Take 1 tablet (15 mg total) by mouth 2 (two) times daily. 60 tablet 0   Multiple Vitamins-Minerals (MULTIVITAMIN WITH MINERALS) tablet Take 1 tablet by mouth daily.     nitroGLYCERIN (NITROSTAT) 0.4 MG SL tablet Place 0.4 mg under the tongue every 5 (five) minutes as needed for chest pain. Repeat every 5 minutes if needed for a total of 3 tablets in 15 minutes. If no relief, CALL 911.     Omega-3 Fatty Acids (FISH OIL) 1000 MG CAPS Take  2,000 mg by mouth in the morning and at bedtime.     ondansetron (ZOFRAN) 8 MG tablet Take 8 mg by mouth every 8 (eight) hours as needed for nausea or vomiting.     oxyCODONE (OXYCONTIN) 10 mg 12 hr tablet Take 1 tablet (10 mg total) by mouth every 12 (twelve) hours. 60 tablet 0   polyethylene glycol powder (GLYCOLAX/MIRALAX) 17 GM/SCOOP powder Take 1 Container by mouth once.     potassium chloride (KLOR-CON M) 10 MEQ tablet Take 1 tablet (10 mEq total) by mouth daily. 30 tablet 3   predniSONE (DELTASONE) 5 MG tablet Take 1 tablet (5 mg total) by mouth 2 (two) times daily with a meal. 60 tablet 5   prochlorperazine (COMPAZINE) 10 MG tablet Take 1 tablet (10 mg total) by mouth every 6 (six) hours as needed for nausea or vomiting. 30 tablet 2   Royal Jelly-Bee Pollen-Ginseng (KOREAN GINSENG COMPLEX) 150-250-50 MG CAPS Take 1 capsule by mouth daily.     triamcinolone (KENALOG) 0.025 % cream Apply 1 Application topically 2 (two) times daily. 30 g 5   Zoledronic Acid (ZOMETA) 4 MG/100ML IVPB Inject 4 mg into the vein every 3 (three) months.     No current facility-administered medications for this visit.   Facility-Administered Medications Ordered in Other Visits  Medication Dose Route Frequency Provider Last Rate Last Admin   sodium chloride flush (NS) 0.9 % injection 10 mL  10 mL Intravenous PRN [name], MD   10 mL at 03/17/23 5784    I,Jasmine M Lassiter,acting as a scribe for [name], MD.,have documented all relevant documentation on the behalf of [name], MD,as directed by  [name], MD while in the presence of [name], MD.

## 2023-04-13 ENCOUNTER — Other Ambulatory Visit: Payer: Self-pay | Admitting: Oncology

## 2023-04-13 ENCOUNTER — Other Ambulatory Visit: Payer: Self-pay

## 2023-04-13 ENCOUNTER — Inpatient Hospital Stay: Payer: No Typology Code available for payment source

## 2023-04-13 ENCOUNTER — Encounter: Payer: Self-pay | Admitting: Oncology

## 2023-04-13 ENCOUNTER — Inpatient Hospital Stay (HOSPITAL_BASED_OUTPATIENT_CLINIC_OR_DEPARTMENT_OTHER): Payer: No Typology Code available for payment source | Admitting: Oncology

## 2023-04-13 ENCOUNTER — Other Ambulatory Visit (HOSPITAL_BASED_OUTPATIENT_CLINIC_OR_DEPARTMENT_OTHER): Payer: Self-pay

## 2023-04-13 ENCOUNTER — Telehealth: Payer: Self-pay | Admitting: Oncology

## 2023-04-13 VITALS — BP 122/68 | HR 61 | Temp 97.8°F | Resp 18 | Ht 67.0 in | Wt 187.8 lb

## 2023-04-13 DIAGNOSIS — C7800 Secondary malignant neoplasm of unspecified lung: Secondary | ICD-10-CM

## 2023-04-13 DIAGNOSIS — C61 Malignant neoplasm of prostate: Secondary | ICD-10-CM

## 2023-04-13 DIAGNOSIS — C7951 Secondary malignant neoplasm of bone: Secondary | ICD-10-CM

## 2023-04-13 DIAGNOSIS — E876 Hypokalemia: Secondary | ICD-10-CM

## 2023-04-13 DIAGNOSIS — C7952 Secondary malignant neoplasm of bone marrow: Secondary | ICD-10-CM | POA: Diagnosis not present

## 2023-04-13 DIAGNOSIS — Z5111 Encounter for antineoplastic chemotherapy: Secondary | ICD-10-CM | POA: Diagnosis not present

## 2023-04-13 LAB — CMP (CANCER CENTER ONLY)
ALT: 15 U/L (ref 0–44)
AST: 23 U/L (ref 15–41)
Albumin: 4.3 g/dL (ref 3.5–5.0)
Alkaline Phosphatase: 77 U/L (ref 38–126)
Anion gap: 11 (ref 5–15)
BUN: 17 mg/dL (ref 8–23)
CO2: 26 mmol/L (ref 22–32)
Calcium: 9.8 mg/dL (ref 8.9–10.3)
Chloride: 105 mmol/L (ref 98–111)
Creatinine: 0.77 mg/dL (ref 0.61–1.24)
GFR, Estimated: >60 mL/min (ref >60)
Glucose, Bld: 128 mg/dL — ABNORMAL HIGH (ref 70–99)
Potassium: 3.4 mmol/L — ABNORMAL LOW (ref 3.5–5.1)
Sodium: 142 mmol/L (ref 135–145)
Total Bilirubin: 0.3 mg/dL (ref 0.0–1.2)
Total Protein: 6.6 g/dL (ref 6.5–8.1)

## 2023-04-13 LAB — CBC WITH DIFFERENTIAL (CANCER CENTER ONLY)
Abs Immature Granulocytes: 0.1 10*3/uL — ABNORMAL HIGH (ref 0.00–0.07)
Basophils Absolute: 0.1 10*3/uL (ref 0.0–0.1)
Basophils Relative: 1 %
Eosinophils Absolute: 0 10*3/uL (ref 0.0–0.5)
Eosinophils Relative: 0 %
HCT: 35.8 % — ABNORMAL LOW (ref 39.0–52.0)
Hemoglobin: 11.2 g/dL — ABNORMAL LOW (ref 13.0–17.0)
Immature Granulocytes: 1 %
Lymphocytes Relative: 30 %
Lymphs Abs: 3.3 10*3/uL (ref 0.7–4.0)
MCH: 29.2 pg (ref 26.0–34.0)
MCHC: 31.3 g/dL (ref 30.0–36.0)
MCV: 93.2 fL (ref 80.0–100.0)
Monocytes Absolute: 0.8 10*3/uL (ref 0.1–1.0)
Monocytes Relative: 8 %
Neutro Abs: 6.8 10*3/uL (ref 1.7–7.7)
Neutrophils Relative %: 60 %
Platelet Count: 204 10*3/uL (ref 150–400)
RBC: 3.84 MIL/uL — ABNORMAL LOW (ref 4.22–5.81)
RDW: 17.3 % — ABNORMAL HIGH (ref 11.5–15.5)
WBC Count: 11.1 10*3/uL — ABNORMAL HIGH (ref 4.0–10.5)
nRBC: 0 % (ref 0.0–0.2)
nRBC: 0 /100{WBCs}

## 2023-04-13 LAB — PSA: Prostatic Specific Antigen: 423.96 ng/mL — ABNORMAL HIGH (ref 0.00–4.00)

## 2023-04-13 MED ORDER — DIPHENHYDRAMINE HCL 50 MG/ML IJ SOLN
25.0000 mg | Freq: Once | INTRAMUSCULAR | Status: AC
  Administered 2023-04-13: 25 mg via INTRAVENOUS
  Filled 2023-04-13: qty 1

## 2023-04-13 MED ORDER — SODIUM CHLORIDE 0.9% FLUSH
10.0000 mL | INTRAVENOUS | Status: DC | PRN
  Administered 2023-04-13: 10 mL

## 2023-04-13 MED ORDER — FAMOTIDINE IN NACL 20-0.9 MG/50ML-% IV SOLN
20.0000 mg | Freq: Once | INTRAVENOUS | Status: AC
Start: 2023-04-13 — End: 2023-04-13
  Administered 2023-04-13: 20 mg via INTRAVENOUS
  Filled 2023-04-13: qty 50

## 2023-04-13 MED ORDER — HEPARIN SOD (PORK) LOCK FLUSH 100 UNIT/ML IV SOLN
500.0000 [IU] | Freq: Once | INTRAVENOUS | Status: AC | PRN
  Administered 2023-04-13: 500 [IU]

## 2023-04-13 MED ORDER — POTASSIUM CHLORIDE CRYS ER 10 MEQ PO TBCR: 10.0000 meq | EXTENDED_RELEASE_TABLET | Freq: Every day | ORAL | 3 refills | Status: DC

## 2023-04-13 MED ORDER — SODIUM CHLORIDE 0.9 % IV SOLN
20.0000 mg/m2 | Freq: Once | INTRAVENOUS | Status: AC
  Administered 2023-04-13: 41 mg via INTRAVENOUS
  Filled 2023-04-13: qty 4.1

## 2023-04-13 MED ORDER — SODIUM CHLORIDE 0.9 % IV SOLN
Freq: Once | INTRAVENOUS | Status: AC
Start: 2023-04-13 — End: 2023-04-13

## 2023-04-13 MED ORDER — DEXAMETHASONE SODIUM PHOSPHATE 10 MG/ML IJ SOLN
10.0000 mg | Freq: Once | INTRAMUSCULAR | Status: AC
  Administered 2023-04-13: 10 mg via INTRAVENOUS
  Filled 2023-04-13: qty 1

## 2023-04-13 MED ORDER — POTASSIUM CHLORIDE CRYS ER 10 MEQ PO TBCR
10.0000 meq | EXTENDED_RELEASE_TABLET | Freq: Every day | ORAL | 3 refills | Status: DC
  Filled 2023-04-13: qty 30, 30d supply, fill #0

## 2023-04-13 NOTE — Telephone Encounter (Signed)
Patient has been scheduled for follow-up visit per 04/13/23 LOS.  Pt given an appt calendar with date and time.

## 2023-04-13 NOTE — Patient Instructions (Signed)
Cabazitaxel Injection What is this medication? CABAZITAXEL (ka BAZ i TAX el) treats prostate cancer. It works by slowing down the growth of cancer cells. This medicine may be used for other purposes; ask your health care provider or pharmacist if you have questions. COMMON BRAND NAME(S): Jevtana What should I tell my care team before I take this medication? They need to know if you have any of these conditions: Kidney problems Liver disease Low white blood cell levels Lung disease Stomach or intestine problems An unusual or allergic reaction to cabazitaxel, polysorbate 80, other medications, foods, dyes, or preservatives Pregnant or trying to get pregnant Breast-feeding How should I use this medication? This medication is injected into a vein. It is given by your care team in a hospital or clinic setting. Talk to your care team about the use of this medication in children. Special care may be needed. Overdosage: If you think you have taken too much of this medicine contact a poison control center or emergency room at once. NOTE: This medicine is only for you. Do not share this medicine with others. What if I miss a dose? Keep appointments for follow-up doses. It is important not to miss your dose. Call your care team if you are unable to keep an appointment. What may interact with this medication? Certain antibiotics, such as clarithromycin or telithromycin Certain antivirals for HIV or AIDS Certain medications for fungal infections like ketoconazole, itraconazole, and voriconazole Nefazodone This list may not describe all possible interactions. Give your health care provider a list of all the medicines, herbs, non-prescription drugs, or dietary supplements you use. Also tell them if you smoke, drink alcohol, or use illegal drugs. Some items may interact with your medicine. What should I watch for while using this medication? This medication may make you feel generally unwell. This is  not uncommon as chemotherapy can affect healthy cells as well as cancer cells. Report any side effects. Continue your course of treatment even though you feel ill unless your care team tells you to stop. You may need blood work while you are taking this medication. This medication may increase your risk of getting an infection. Call your care team for advice if you get a fever, chills, sore throat, or other symptoms of a cold or flu. Do not treat yourself. Try to avoid being around people who are sick. Avoid taking medications that contain aspirin, acetaminophen, ibuprofen, naproxen, or ketoprofen unless instructed by your care team. These medications may hide a fever. Be careful brushing or flossing your teeth or using a toothpick because you may get an infection or bleed more easily. If you have any dental work done, tell your dentist you are receiving this medication. This medication can cause serious infusion reactions. To reduce the risk, your care team may give you other medications to take before receiving this one. Be sure to follow the directions from your care team. Use a condom during sex while taking this medication and for 4 months after the last dose. Talk to your care team right away if your partner may be pregnant. This medication can cause serious birth defects. This medication may cause infertility. Talk to your care team if you are concerned about your fertility. What side effects may I notice from receiving this medication? Side effects that you should report to your care team as soon as possible: Allergic reactions--skin rash, itching, hives, swelling of the face, lips, tongue, or throat Diarrhea, nausea, vomiting Dry cough, shortness of breath or  trouble breathing Infection--fever, chills, cough, or sore throat Kidney injury--decrease in the amount of urine, swelling of the ankles, hands, or feet Pain, tingling, or numbness in the hands or feet Red or dark brown urine Stomach  bleeding--bloody or black, tar-like stools, vomiting blood or brown material that looks like coffee grounds Stomach pain that is severe, does not go away, or gets worse Unusual bruising or bleeding Side effects that usually do not require medical attention (report these to your care team if they continue or are bothersome): Loss of appetite Unusual weakness or fatigue This list may not describe all possible side effects. Call your doctor for medical advice about side effects. You may report side effects to FDA at 1-800-FDA-1088. Where should I keep my medication? This medication is given in a hospital or clinic. It will not be stored at home. NOTE: This sheet is a summary. It may not cover all possible information. If you have questions about this medicine, talk to your doctor, pharmacist, or health care provider.  2024 Elsevier/Gold Standard (2023-02-11 00:00:00)

## 2023-04-15 ENCOUNTER — Inpatient Hospital Stay: Payer: No Typology Code available for payment source

## 2023-04-15 VITALS — BP 128/70 | HR 62 | Temp 98.0°F | Resp 18

## 2023-04-15 DIAGNOSIS — C61 Malignant neoplasm of prostate: Secondary | ICD-10-CM

## 2023-04-15 DIAGNOSIS — C7951 Secondary malignant neoplasm of bone: Secondary | ICD-10-CM

## 2023-04-15 DIAGNOSIS — Z5111 Encounter for antineoplastic chemotherapy: Secondary | ICD-10-CM | POA: Diagnosis not present

## 2023-04-15 MED ORDER — PEGFILGRASTIM-FPGK 6 MG/0.6ML ~~LOC~~ SOSY
6.0000 mg | PREFILLED_SYRINGE | Freq: Once | SUBCUTANEOUS | Status: AC
  Administered 2023-04-15: 6 mg via SUBCUTANEOUS
  Filled 2023-04-15: qty 0.6

## 2023-04-18 ENCOUNTER — Other Ambulatory Visit: Payer: Self-pay

## 2023-04-18 ENCOUNTER — Other Ambulatory Visit: Payer: Self-pay | Admitting: Hematology and Oncology

## 2023-04-18 MED ORDER — PROCHLORPERAZINE MALEATE 10 MG PO TABS: 10.0000 mg | ORAL_TABLET | Freq: Four times a day (QID) | ORAL | 2 refills | Status: AC | PRN

## 2023-04-19 ENCOUNTER — Encounter: Payer: Self-pay | Admitting: Oncology

## 2023-04-25 ENCOUNTER — Telehealth: Payer: Self-pay

## 2023-04-25 NOTE — Telephone Encounter (Signed)
 Patient notified

## 2023-04-25 NOTE — Telephone Encounter (Signed)
-----   Message from [name] sent at 04/19/2023  6:51 PM EST ----- Regarding: call Tell him PSA came down another 300 points, good news

## 2023-04-29 ENCOUNTER — Other Ambulatory Visit: Payer: Self-pay | Admitting: Hematology and Oncology

## 2023-04-29 MED ORDER — MORPHINE SULFATE ER 15 MG PO TBCR
15.0000 mg | EXTENDED_RELEASE_TABLET | Freq: Two times a day (BID) | ORAL | 0 refills | Status: DC
Start: 2023-04-29 — End: 2023-05-31

## 2023-05-03 ENCOUNTER — Encounter: Payer: Self-pay | Admitting: Oncology

## 2023-05-03 NOTE — Progress Notes (Incomplete)
Mar[facility]  [address] [place],  [place]  [address] [phone]  Clinic Day: 05/03/23   Referring physician: [name], MD  ASSESSMENT & PLAN:  Assessment: Malignant neoplasm of prostate [facility]) Stage IVA prostate cancer diagnosed in January 2016.  He had progressive disease in the bone and lung in September 2023, so was placed on docetaxel chemotherapy and continued leuprolide and zoledronic acid.  His PSA went down from 536 to 400.75 after 3 cycles, then to 284 with the 5th cycle, and then 203 in March 2024. PSA then went up to 261 in May. We decided to give him a break from chemotherapy at that time.  We continued leuprolide and zolendronic acid every 3 months. The PSA was 1156. PSMA PET in September of 2024 revealed progression of skeletal metastasis. Multiple new radiotracer avid lesions within the spine. Individual lesions are increased in size compared to prior accompanied by sclerotic CT change. 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 No evidence of nodal metastasis or visceral metastasis. He is receiving palliative chemotherapy with cabazitaxel/prednisone since October of 2024. His PSA was up to 1608 prior to starting and went down to 1317 prior to a 2nd cycle and 843 prior to his 4th cycle. PSA is pending from today. He continues to tolerate treatment fairly well.    Secondary malignant neoplasm of bone and bone marrow (HCC) He continues zoledronic acid every 3 months in addition to his cancer therapy. He had marked progression of his bone metastases in August 2023, but minimal symptoms. He received docetaxel chemotherapy with a decrease in the PSA. Docetaxel was discontinued in May 2024.  He then had a significant increase in his PSA and progression of the bone metastases.  PET scan revealed multiple new lesions of the spine compared to August 2023, including T12-L1 and L2, sacrum, multiple rib lesions, multiple scapular lesions and pelvic  lesions.  Other lesions so increased in size.  He has widespread skeletal metastases, but no evidence of nodal or visceral metastasis.  He continues zoledronic acid and leuprolide every 3 months. He is also undergoing chemotherapy with cabazitaxel/prednisone since October of 2024.   Anemia He had an upper GI hemorrhage last year and was referred to Dr. Jennye Johnson. Fortunately, he just had the one episode and was transfused. His hemoglobin came up to 11.1 and his hemoglobin remained stable at 11.3. He had a EGD scheduled in April, but it was canceled. His cardiologist Dr. Allyson Johnson was not comfortable with him stopping clopidogrel for another year. His bleeding stopped and we decided not pursue an EGD at this time unless he begins having melena again and/or a significant drop in his hemoglobin.  His hemoglobin is stable. He denies obvious blood loss. His anemia is a little better today at 11.2.  We will continue to monitor this.  Hypokalemia New mild hypokalemia. I gave him IV potassium and it did show improvement but it remains low. I will therefore start him on oral supplement of daily. We will continue to monitor.  Plan: He went to urgent care over the weekend on 04/09/2023 and they placed him on doxycycline and cough syrup with codeine, he notes that his symptoms are improving now. He has a elevated WBC of 11.1, hemoglobin of 11.2 up from 10.8, and platelet count of 204,000. His CMP is normal other than a low potassium of 3.4, improved from 3.2. He does not take any potassium supplement at this time. I am not sure why he has  hypokalemia as he is not on a diuretic, it could possibly be related to his prednisone. I will place him on a oral potassium supplement 10 meq once daily. His last PSA dropped from 843.77 to 741.46 and I will add another to his labs today. His day 1 cycle 6 of Jevtana is scheduled on 04/13/2023. He continues Prednisone 5 mg BID. I will see him back in 3 weeks with CBC, CMP, and  PSA. The patient understands the plans discussed today and is in agreement with them.  He knows to contact our office if he develops concerns prior to his next appointment.  I provided 30 minutes of face-to-face time during this encounter and > 50% was spent counseling as documented under my assessment and plan.   Marvin Beckwith, MD  [facility] [address] [place] [address] Dept: [phone] Dept Fax: [phone]   No orders of the defined types were placed in this encounter.   CHIEF COMPLAINT:  CC: Stage IVA prostate cancer   Current Treatment: Cabazitaxel/prednisone  HISTORY OF PRESENT ILLNESS:   Oncology History  Malignant neoplasm of prostate (HCC)  03/30/2014 Cancer Staging   Staging form: Prostate, AJCC 8th Edition - Clinical stage from 03/30/2014: Stage IVA (cT3b, cN1, cM0, PSA: 51.8, Grade Group: 4) - Signed by Marvin Beckwith, MD on 11/14/2020 Histopathologic type: Adenocarcinoma, NOS Stage prefix: Initial diagnosis Prostate specific antigen (PSA) range: 20 or greater Gleason primary pattern: 4 Gleason secondary pattern: 4 Gleason score: 8 Histologic grading system: 5 grade system Location of positive needle core biopsies: Beyond primary site Prognostic indicators: May 2016 Robotic prostatectomy with mult. Pos margins, extra prostatic extension, 2/6 pos nodes. Scan June 2016 positive at T10  PSA up to 216.3 by August Placed on lupron and casodex Stage used in treatment planning: Yes National guidelines used in treatment planning: Yes Type of national guideline used in treatment planning: NCCN   02/18/2020 Initial Diagnosis   Malignant neoplasm of prostate (HCC)   12/01/2021 - 07/19/2022 Chemotherapy   Patient is on Treatment Plan : PROSTATE Docetaxel (75) + Prednisone q21d     12/21/2022 -  Chemotherapy   Patient is on Treatment Plan : PROSTATE Cabazitaxel (20) D1 +  Prednisone D1-21 q21d     Secondary malignant neoplasm of bone and bone marrow (HCC)  02/18/2020 Initial Diagnosis   Secondary malignant neoplasm of bone and bone marrow (HCC)   12/01/2021 - 07/19/2022 Chemotherapy   Patient is on Treatment Plan : PROSTATE Docetaxel (75) + Prednisone q21d     12/21/2022 -  Chemotherapy   Patient is on Treatment Plan : PROSTATE Cabazitaxel (20) D1 + Prednisone D1-21 q21d     Malignant neoplasm of prostate metastatic to lung (HCC)  11/25/2021 Initial Diagnosis   Malignant neoplasm of prostate metastatic to lung (HCC)   12/21/2022 -  Chemotherapy   Patient is on Treatment Plan : PROSTATE Cabazitaxel (20) D1 + Prednisone D1-21 q21d         INTERVAL HISTORY:  Marvin Johnson is here today for repeat clinical assessment for his stage IVA prostate cancer. Patient states that he feels *** and ***.     He denies signs of infection such as sore throat, sinus drainage, cough, or urinary symptoms.  He denies fevers or recurrent chills. He denies pain. He denies nausea, vomiting, chest pain, dyspnea or cough. His appetite is *** and his weight {Weight change:10426}.  Patient states that he feels well and has no complaints of pain. He informed me that he still has an respiratory infection after completing a course of Z-pack from last visit. He went to Urgent care over the weekend on 04/09/2023 and they placed him on doxycycline and cough syrup with codeine, he notes that his symptoms are improving now. He has an elevated WBC of 11.1, hemoglobin of 11.2 up from 10.8, and platelet count of 204,000. His CMP is normal other than a low potassium of 3.4 improved from 3.2. He does not take any potassium supplement at this time. I am not sure why he has hypokalemia as he is not on a diuretic, it could possibly be related to his prednisone. I will place him on a oral potassium supplement 10 meq once daily. His last PSA dropped from 843.77 to 741.46 and I will add one to his labs today. His  day 1 cycle 6 of Jevtana is scheduled on 04/13/2023. He continues Prednisone 5 mg BID. I will see him back in 3 weeks with CBC, CMP, and PSA. He denies signs of infection such as sore throat or urinary symptoms.  He denies fevers or recurrent chills. He denies pain. He denies nausea, vomiting, chest pain, dyspnea. His appetite is good and his weight has increased 3 pounds over last 3 weeks .  REVIEW OF SYSTEMS:  Review of Systems  Constitutional: Negative.  Negative for appetite change, chills, diaphoresis, fatigue, fever and unexpected weight change.  HENT:  Negative.  Negative for hearing loss, lump/mass, mouth sores, nosebleeds, sore throat, tinnitus, trouble swallowing and voice change.   Eyes: Negative.  Negative for eye problems and icterus.  Respiratory:  Positive for cough (productive of clear sputum). Negative for chest tightness, hemoptysis, shortness of breath and wheezing.   Cardiovascular: Negative.  Negative for chest pain, leg swelling and palpitations.  Gastrointestinal: Negative.  Negative for abdominal distention, abdominal pain, blood in stool, constipation, diarrhea, nausea, rectal pain and vomiting.  Endocrine: Negative.  Negative for hot flashes.  Genitourinary: Negative.  Negative for bladder incontinence, difficulty urinating, dyspareunia, dysuria, frequency, hematuria, nocturia, pelvic pain and penile discharge.   Musculoskeletal: Negative.  Negative for arthralgias, back pain, flank pain, gait problem, myalgias, neck pain and neck stiffness.  Skin: Negative.  Negative for itching, rash and wound.  Neurological: Negative.  Negative for dizziness, extremity weakness, gait problem, headaches, light-headedness, numbness, seizures and speech difficulty.  Hematological:  Negative for adenopathy. Bruises/bleeds easily (easy bruising).  Psychiatric/Behavioral: Negative.  Negative for confusion, decreased concentration, depression, sleep disturbance and suicidal ideas. The patient is  not nervous/anxious.      VITALS:  There were no vitals taken for this visit.  Wt Readings from Last 3 Encounters:  04/13/23 187 lb 12.8 oz (85.2 kg)  03/25/23 188 lb 1.3 oz (85.3 kg)  03/24/23 184 lb 8 oz (83.7 kg)    There is no height or weight on file to calculate BMI.  Performance status (ECOG): 0 - Asymptomatic  PHYSICAL EXAM:  Physical Exam Vitals and nursing note reviewed.  Constitutional:      General: He is not in acute distress.    Appearance: Normal appearance. He is normal weight. He is not ill-appearing, toxic-appearing or diaphoretic.  HENT:     Head: Normocephalic and atraumatic.     Right Ear: Tympanic membrane, ear canal and external ear normal. There is no impacted cerumen.     Left Ear: Tympanic membrane, ear canal and external ear normal. There  is no impacted cerumen.     Nose: Nose normal. No congestion or rhinorrhea.     Mouth/Throat:     Mouth: Mucous membranes are moist.     Pharynx: Oropharynx is clear. No oropharyngeal exudate, posterior oropharyngeal erythema, uvula swelling or postnasal drip.     Tonsils: No tonsillar exudate or tonsillar abscesses.  Eyes:     General: No scleral icterus.       Right eye: No discharge.        Left eye: No discharge.     Extraocular Movements: Extraocular movements intact.     Conjunctiva/sclera: Conjunctivae normal.     Pupils: Pupils are equal, round, and reactive to light.  Neck:     Vascular: No carotid bruit.  Cardiovascular:     Rate and Rhythm: Normal rate and regular rhythm.     Pulses: Normal pulses.     Heart sounds: Normal heart sounds. No murmur heard.    No friction rub. No gallop.  Pulmonary:     Effort: Pulmonary effort is normal. No respiratory distress.     Breath sounds: Normal breath sounds. No stridor. No wheezing, rhonchi or rales.  Chest:     Chest wall: No tenderness.  Abdominal:     General: Bowel sounds are normal. There is no distension.     Palpations: Abdomen is soft. There is  no hepatomegaly, splenomegaly or mass.     Tenderness: There is no abdominal tenderness. There is no right CVA tenderness, left CVA tenderness, guarding or rebound.     Hernia: No hernia is present.  Musculoskeletal:        General: No swelling, tenderness, deformity or signs of injury. Normal range of motion.     Cervical back: Normal range of motion and neck supple. No rigidity or tenderness.     Right lower leg: No edema.     Left lower leg: No edema.  Lymphadenopathy:     Cervical: No cervical adenopathy.     Upper Body:     Right upper body: No supraclavicular or axillary adenopathy.     Left upper body: No supraclavicular or axillary adenopathy.     Lower Body: No right inguinal adenopathy. No left inguinal adenopathy.  Skin:    General: Skin is warm and dry.     Coloration: Skin is not jaundiced or pale.     Findings: No bruising, erythema, lesion or rash.  Neurological:     General: No focal deficit present.     Mental Status: He is alert and oriented to person, place, and time. Mental status is at baseline.     Cranial Nerves: No cranial nerve deficit.     Sensory: No sensory deficit.     Motor: No weakness.     Coordination: Coordination normal.     Gait: Gait normal.     Deep Tendon Reflexes: Reflexes normal.  Psychiatric:        Mood and Affect: Mood normal.        Behavior: Behavior normal.        Thought Content: Thought content normal.        Judgment: Judgment normal.    LABS:      Latest Ref Rng & Units 04/13/2023    8:28 AM 03/24/2023    8:28 AM 03/17/2023    9:01 AM  CBC  WBC 4.0 - 10.5 K/uL 11.1  7.8  12.5   Hemoglobin 13.0 - 17.0 g/dL 16.1  09.6  04.5  Hematocrit 39.0 - 52.0 % 35.8  33.4  34.2   Platelets 150 - 400 K/uL 204  264  216       Latest Ref Rng & Units 04/13/2023    8:28 AM 03/24/2023    8:28 AM 03/17/2023    9:01 AM  CMP  Glucose 70 - 99 mg/dL 604  540  981   BUN 8 - 23 mg/dL 17  13  16    Creatinine 0.61 - 1.24 mg/dL 1.91  4.78  2.95    Sodium 135 - 145 mmol/L 142  140  141   Potassium 3.5 - 5.1 mmol/L 3.4  3.2  3.5   Chloride 98 - 111 mmol/L 105  103  102   CO2 22 - 32 mmol/L 26  24  27    Calcium 8.9 - 10.3 mg/dL 9.8  9.1  9.2   Total Protein 6.5 - 8.1 g/dL 6.6  6.7  6.8   Total Bilirubin 0.0 - 1.2 mg/dL 0.3  0.5  0.3   Alkaline Phos 38 - 126 U/L 77  71  75   AST 15 - 41 U/L 23  29  32   ALT 0 - 44 U/L 15  17  16     Lab Results  Component Value Date   PSA1 1,362.0 (H) 02/01/2023   Lab Results  Component Value Date   TIBC 398 01/29/2022   FERRITIN 206 01/29/2022   IRONPCTSAT 25 01/29/2022   STUDIES:     HISTORY:   Past Medical History:  Diagnosis Date   Hyperlipidemia    Hypokalemia 03/24/2023   Malignant neoplasm of prostate (HCC)    Supraventricular tachycardia (HCC)     Past Surgical History:  Procedure Laterality Date   CORONARY STENT INTERVENTION N/A 02/08/2022   Procedure: CORONARY STENT INTERVENTION;  Surgeon: Yvonne Kendall, MD;  Location: [facility];  Service: Cardiovascular;  Laterality: N/A;   LEFT HEART CATH AND CORONARY ANGIOGRAPHY N/A 02/08/2022   Procedure: LEFT HEART CATH AND CORONARY ANGIOGRAPHY;  Surgeon: Yvonne Kendall, MD;  Location: [facility];  Service: Cardiovascular;  Laterality: N/A;   PROSTATECTOMY  07/2014   TONSILLECTOMY      Family History  Problem Relation Age of Onset   Breast cancer Mother 80   Ovarian cancer Mother 91   Breast cancer Paternal Grandmother 72    Social History:  reports that he has never smoked. He has never used smokeless tobacco. He reports that he does not currently use alcohol. He reports that he does not use drugs.The patient is alone today.  Allergies:  Allergies  Allergen Reactions   Atorvastatin    Rosuvastatin Other (See Comments)    Other reaction(s): Muscle pain   Simvastatin Other (See Comments)    Other reaction(s): Joint pain, Muscle pain   Sulfa Antibiotics    Sulfamethoxazole-Trimethoprim Other (See  Comments)    Unknown as to interaction was a baby.    Current Medications: Current Outpatient Medications  Medication Sig Dispense Refill   Alirocumab (PRALUENT Benjamin Perez) Inject into the skin. One injection every 2 weeks Monday     APPLE CIDER VINEGAR PO Take by mouth. (Patient not taking: Reported on 01/06/2023)     Cholecalciferol 25 MCG (1000 UT) tablet Take 1,000 Units by mouth in the morning and at bedtime.     clopidogrel (PLAVIX) 75 MG tablet Take 75 mg by mouth daily.     diphenhydrAMINE (BENADRYL) 25 mg capsule Take 1 capsule by mouth at  bedtime.     diphenhydrAMINE (BENADRYL) 50 MG capsule Take 50 mg by mouth at bedtime as needed.     ezetimibe (ZETIA) 10 MG tablet Take 10 mg by mouth daily.     fluconazole (DIFLUCAN) 100 MG tablet Take 1 tablet (100 mg total) by mouth daily. (Patient not taking: Reported on 01/06/2023) 10 tablet 0   guaiFENesin-codeine 100-10 MG/5ML syrup Take 5-10 mLs by mouth every 4 (four) hours as needed.     isosorbide mononitrate (IMDUR) 30 MG 24 hr tablet Take 1 tablet by mouth every 8 (eight) hours as needed.     leuprolide (LUPRON) 22.5 MG injection Inject 22.5 mg into the muscle every 3 (three) months.     metoprolol tartrate (LOPRESSOR) 25 MG tablet Take 12.5 mg by mouth 2 (two) times daily.     morphine (MS CONTIN) 15 MG 12 hr tablet Take 1 tablet (15 mg total) by mouth 2 (two) times daily. 60 tablet 0   Multiple Vitamins-Minerals (MULTIVITAMIN WITH MINERALS) tablet Take 1 tablet by mouth daily.     nitroGLYCERIN (NITROSTAT) 0.4 MG SL tablet Place 0.4 mg under the tongue every 5 (five) minutes as needed for chest pain. Repeat every 5 minutes if needed for a total of 3 tablets in 15 minutes. If no relief, CALL 911.     Omega-3 Fatty Acids (FISH OIL) 1000 MG CAPS Take 2,000 mg by mouth in the morning and at bedtime.     ondansetron (ZOFRAN) 8 MG tablet Take 8 mg by mouth every 8 (eight) hours as needed for nausea or vomiting.     polyethylene glycol powder  (GLYCOLAX/MIRALAX) 17 GM/SCOOP powder Take 1 Container by mouth once.     potassium chloride (KLOR-CON M) 10 MEQ tablet Take 1 tablet (10 mEq total) by mouth daily. 30 tablet 3   predniSONE (DELTASONE) 5 MG tablet Take 1 tablet (5 mg total) by mouth 2 (two) times daily with a meal. 60 tablet 5   prochlorperazine (COMPAZINE) 10 MG tablet Take 1 tablet (10 mg total) by mouth every 6 (six) hours as needed for nausea or vomiting. 30 tablet 2   Royal Jelly-Bee Pollen-Ginseng (KOREAN GINSENG COMPLEX) 150-250-50 MG CAPS Take 1 capsule by mouth daily.     triamcinolone (KENALOG) 0.025 % cream Apply 1 Application topically 2 (two) times daily. 30 g 5   Zoledronic Acid (ZOMETA) 4 MG/100ML IVPB Inject 4 mg into the vein every 3 (three) months.     No current facility-administered medications for this visit.   Facility-Administered Medications Ordered in Other Visits  Medication Dose Route Frequency Provider Last Rate Last Admin   sodium chloride flush (NS) 0.9 % injection 10 mL  10 mL Intravenous PRN Marvin Beckwith, MD   10 mL at 03/17/23 1308    I,Jasmine M Lassiter,acting as a scribe for Marvin Beckwith, MD.,have documented all relevant documentation on the behalf of Marvin Beckwith, MD,as directed by  Marvin Beckwith, MD while in the presence of Marvin Beckwith, MD.

## 2023-05-04 ENCOUNTER — Encounter: Payer: Self-pay | Admitting: Hematology and Oncology

## 2023-05-04 ENCOUNTER — Inpatient Hospital Stay: Payer: No Typology Code available for payment source | Attending: Oncology

## 2023-05-04 ENCOUNTER — Inpatient Hospital Stay: Payer: No Typology Code available for payment source | Admitting: Oncology

## 2023-05-04 ENCOUNTER — Other Ambulatory Visit: Payer: Self-pay

## 2023-05-04 ENCOUNTER — Inpatient Hospital Stay (HOSPITAL_BASED_OUTPATIENT_CLINIC_OR_DEPARTMENT_OTHER): Payer: No Typology Code available for payment source | Admitting: Hematology and Oncology

## 2023-05-04 ENCOUNTER — Telehealth: Payer: Self-pay | Admitting: Hematology and Oncology

## 2023-05-04 ENCOUNTER — Inpatient Hospital Stay: Payer: No Typology Code available for payment source

## 2023-05-04 VITALS — BP 124/74 | HR 64 | Temp 97.5°F | Resp 18 | Ht 67.0 in | Wt 182.0 lb

## 2023-05-04 DIAGNOSIS — D649 Anemia, unspecified: Secondary | ICD-10-CM | POA: Diagnosis not present

## 2023-05-04 DIAGNOSIS — C7951 Secondary malignant neoplasm of bone: Secondary | ICD-10-CM

## 2023-05-04 DIAGNOSIS — I471 Supraventricular tachycardia, unspecified: Secondary | ICD-10-CM | POA: Insufficient documentation

## 2023-05-04 DIAGNOSIS — E785 Hyperlipidemia, unspecified: Secondary | ICD-10-CM | POA: Insufficient documentation

## 2023-05-04 DIAGNOSIS — Z79899 Other long term (current) drug therapy: Secondary | ICD-10-CM | POA: Insufficient documentation

## 2023-05-04 DIAGNOSIS — C61 Malignant neoplasm of prostate: Secondary | ICD-10-CM | POA: Diagnosis not present

## 2023-05-04 DIAGNOSIS — Z8041 Family history of malignant neoplasm of ovary: Secondary | ICD-10-CM | POA: Insufficient documentation

## 2023-05-04 DIAGNOSIS — Z87440 Personal history of urinary (tract) infections: Secondary | ICD-10-CM | POA: Diagnosis not present

## 2023-05-04 DIAGNOSIS — Z803 Family history of malignant neoplasm of breast: Secondary | ICD-10-CM | POA: Insufficient documentation

## 2023-05-04 DIAGNOSIS — R21 Rash and other nonspecific skin eruption: Secondary | ICD-10-CM | POA: Diagnosis not present

## 2023-05-04 DIAGNOSIS — Z7902 Long term (current) use of antithrombotics/antiplatelets: Secondary | ICD-10-CM | POA: Insufficient documentation

## 2023-05-04 DIAGNOSIS — C7952 Secondary malignant neoplasm of bone marrow: Secondary | ICD-10-CM | POA: Diagnosis not present

## 2023-05-04 DIAGNOSIS — Z7952 Long term (current) use of systemic steroids: Secondary | ICD-10-CM | POA: Diagnosis not present

## 2023-05-04 DIAGNOSIS — K5900 Constipation, unspecified: Secondary | ICD-10-CM | POA: Diagnosis not present

## 2023-05-04 DIAGNOSIS — R1110 Vomiting, unspecified: Secondary | ICD-10-CM | POA: Insufficient documentation

## 2023-05-04 DIAGNOSIS — Z5111 Encounter for antineoplastic chemotherapy: Secondary | ICD-10-CM | POA: Diagnosis present

## 2023-05-04 DIAGNOSIS — N390 Urinary tract infection, site not specified: Secondary | ICD-10-CM | POA: Insufficient documentation

## 2023-05-04 LAB — CBC WITH DIFFERENTIAL (CANCER CENTER ONLY)
Abs Immature Granulocytes: 0.21 10*3/uL — ABNORMAL HIGH (ref 0.00–0.07)
Basophils Absolute: 0.1 10*3/uL (ref 0.0–0.1)
Basophils Relative: 1 %
Eosinophils Absolute: 0 10*3/uL (ref 0.0–0.5)
Eosinophils Relative: 0 %
HCT: 34.7 % — ABNORMAL LOW (ref 39.0–52.0)
Hemoglobin: 11.1 g/dL — ABNORMAL LOW (ref 13.0–17.0)
Immature Granulocytes: 2 %
Lymphocytes Relative: 30 %
Lymphs Abs: 3.8 10*3/uL (ref 0.7–4.0)
MCH: 29.5 pg (ref 26.0–34.0)
MCHC: 32 g/dL (ref 30.0–36.0)
MCV: 92.3 fL (ref 80.0–100.0)
Monocytes Absolute: 1 10*3/uL (ref 0.1–1.0)
Monocytes Relative: 8 %
Neutro Abs: 7.5 10*3/uL (ref 1.7–7.7)
Neutrophils Relative %: 59 %
Platelet Count: 280 10*3/uL (ref 150–400)
RBC: 3.76 MIL/uL — ABNORMAL LOW (ref 4.22–5.81)
RDW: 16.3 % — ABNORMAL HIGH (ref 11.5–15.5)
WBC Count: 12.4 10*3/uL — ABNORMAL HIGH (ref 4.0–10.5)
nRBC: 0 /100{WBCs}
nRBC: 0.04 % (ref 0.0–0.2)

## 2023-05-04 LAB — CMP (CANCER CENTER ONLY)
ALT: 16 U/L (ref 0–44)
AST: 24 U/L (ref 15–41)
Albumin: 4.1 g/dL (ref 3.5–5.0)
Alkaline Phosphatase: 79 U/L (ref 38–126)
Anion gap: 10 (ref 5–15)
BUN: 15 mg/dL (ref 8–23)
CO2: 25 mmol/L (ref 22–32)
Calcium: 9 mg/dL (ref 8.9–10.3)
Chloride: 104 mmol/L (ref 98–111)
Creatinine: 0.76 mg/dL (ref 0.61–1.24)
GFR, Estimated: >60 mL/min (ref >60)
Glucose, Bld: 96 mg/dL (ref 70–99)
Potassium: 4 mmol/L (ref 3.5–5.1)
Sodium: 139 mmol/L (ref 135–145)
Total Bilirubin: 0.3 mg/dL (ref 0.0–1.2)
Total Protein: 6.7 g/dL (ref 6.5–8.1)

## 2023-05-04 LAB — PSA: Prostatic Specific Antigen: 542 ng/mL — ABNORMAL HIGH (ref 0.00–4.00)

## 2023-05-04 MED ORDER — SODIUM CHLORIDE 0.9 % IV SOLN: Freq: Once | INTRAVENOUS | Status: AC

## 2023-05-04 MED ORDER — DEXAMETHASONE SODIUM PHOSPHATE 10 MG/ML IJ SOLN
10.0000 mg | Freq: Once | INTRAMUSCULAR | Status: AC
Start: 2023-05-04 — End: 2023-05-04
  Administered 2023-05-04: 10 mg via INTRAVENOUS
  Filled 2023-05-04: qty 1

## 2023-05-04 MED ORDER — FAMOTIDINE IN NACL 20-0.9 MG/50ML-% IV SOLN
20.0000 mg | Freq: Once | INTRAVENOUS | Status: AC
  Administered 2023-05-04: 20 mg via INTRAVENOUS
  Filled 2023-05-04: qty 50

## 2023-05-04 MED ORDER — HEPARIN SOD (PORK) LOCK FLUSH 100 UNIT/ML IV SOLN
500.0000 [IU] | Freq: Once | INTRAVENOUS | Status: AC | PRN
  Administered 2023-05-04: 500 [IU]

## 2023-05-04 MED ORDER — SODIUM CHLORIDE 0.9% FLUSH
10.0000 mL | INTRAVENOUS | Status: DC | PRN
  Administered 2023-05-04: 10 mL

## 2023-05-04 MED ORDER — SODIUM CHLORIDE 0.9 % IV SOLN
20.0000 mg/m2 | Freq: Once | INTRAVENOUS | Status: AC
  Administered 2023-05-04: 41 mg via INTRAVENOUS
  Filled 2023-05-04: qty 4.1

## 2023-05-04 MED ORDER — DIPHENHYDRAMINE HCL 50 MG/ML IJ SOLN
25.0000 mg | Freq: Once | INTRAMUSCULAR | Status: AC
  Administered 2023-05-04: 25 mg via INTRAVENOUS
  Filled 2023-05-04: qty 1

## 2023-05-04 NOTE — Telephone Encounter (Signed)
Patient has been scheduled for follow-up visit per 05/04/23 LOS.  Pt given an appt calendar with date and time.

## 2023-05-04 NOTE — Assessment & Plan Note (Signed)
He continues zoledronic acid every 3 months in addition to his cancer therapy.  He is currently receiving chemotherapy with cabazitaxel/prednisone.  He continues zoledronic acid and leuprolide every 3 months.  They were last given in December.

## 2023-05-04 NOTE — Assessment & Plan Note (Signed)
 He had an upper GI hemorrhage earlier this year and was referred to Dr. [name]. Fortunately, he just had the one episode and was transfused. His hemoglobin came up to 11.1 and his hemoglobin remained stable at 11.3. He had a EGD scheduled in April, but it was canceled. His cardiologist Dr. Allyson Sabal was not comfortable with him stopping clopidogrel for another year. His bleeding has stopped and so we will not pursue an EGD at this time unless he begins having melena again and/or a significant drop in his hemoglobin.  His hemoglobin is stable. He denies obvious blood loss, but could have chronic mild GI losses. Chemotherapy and bone metastasis can be contributing. We will continue to monitor this.

## 2023-05-04 NOTE — Patient Instructions (Signed)
 Cabazitaxel Injection What is this medication? CABAZITAXEL (ka BAZ i TAX el) treats prostate cancer. It works by slowing down the growth of cancer cells. This medicine may be used for other purposes; ask your health care provider or pharmacist if you have questions. COMMON BRAND NAME(S): Jevtana What should I tell my care team before I take this medication? They need to know if you have any of these conditions: Kidney problems Liver disease Low white blood cell levels Lung disease Stomach or intestine problems An unusual or allergic reaction to cabazitaxel, polysorbate 80, other medications, foods, dyes, or preservatives Pregnant or trying to get pregnant Breast-feeding How should I use this medication? This medication is injected into a vein. It is given by your care team in a hospital or clinic setting. Talk to your care team about the use of this medication in children. Special care may be needed. Overdosage: If you think you have taken too much of this medicine contact a poison control center or emergency room at once. NOTE: This medicine is only for you. Do not share this medicine with others. What if I miss a dose? Keep appointments for follow-up doses. It is important not to miss your dose. Call your care team if you are unable to keep an appointment. What may interact with this medication? Certain antibiotics, such as clarithromycin or telithromycin Certain antivirals for HIV or AIDS Certain medications for fungal infections like ketoconazole, itraconazole, and voriconazole Nefazodone This list may not describe all possible interactions. Give your health care provider a list of all the medicines, herbs, non-prescription drugs, or dietary supplements you use. Also tell them if you smoke, drink alcohol, or use illegal drugs. Some items may interact with your medicine. What should I watch for while using this medication? This medication may make you feel generally unwell. This is  not uncommon as chemotherapy can affect healthy cells as well as cancer cells. Report any side effects. Continue your course of treatment even though you feel ill unless your care team tells you to stop. You may need blood work while you are taking this medication. This medication may increase your risk of getting an infection. Call your care team for advice if you get a fever, chills, sore throat, or other symptoms of a cold or flu. Do not treat yourself. Try to avoid being around people who are sick. Avoid taking medications that contain aspirin, acetaminophen, ibuprofen, naproxen, or ketoprofen unless instructed by your care team. These medications may hide a fever. Be careful brushing or flossing your teeth or using a toothpick because you may get an infection or bleed more easily. If you have any dental work done, tell your dentist you are receiving this medication. This medication can cause serious infusion reactions. To reduce the risk, your care team may give you other medications to take before receiving this one. Be sure to follow the directions from your care team. Use a condom during sex while taking this medication and for 4 months after the last dose. Talk to your care team right away if your partner may be pregnant. This medication can cause serious birth defects. This medication may cause infertility. Talk to your care team if you are concerned about your fertility. What side effects may I notice from receiving this medication? Side effects that you should report to your care team as soon as possible: Allergic reactions--skin rash, itching, hives, swelling of the face, lips, tongue, or throat Diarrhea, nausea, vomiting Dry cough, shortness of breath or  trouble breathing Infection--fever, chills, cough, or sore throat Kidney injury--decrease in the amount of urine, swelling of the ankles, hands, or feet Pain, tingling, or numbness in the hands or feet Red or dark brown urine Stomach  bleeding--bloody or black, tar-like stools, vomiting blood or brown material that looks like coffee grounds Stomach pain that is severe, does not go away, or gets worse Unusual bruising or bleeding Side effects that usually do not require medical attention (report these to your care team if they continue or are bothersome): Loss of appetite Unusual weakness or fatigue This list may not describe all possible side effects. Call your doctor for medical advice about side effects. You may report side effects to FDA at 1-800-FDA-1088. Where should I keep my medication? This medication is given in a hospital or clinic. It will not be stored at home. NOTE: This sheet is a summary. It may not cover all possible information. If you have questions about this medicine, talk to your doctor, pharmacist, or health care provider.  2024 Elsevier/Gold Standard (2023-02-11 00:00:00)

## 2023-05-04 NOTE — Assessment & Plan Note (Signed)
Stage IVA prostate cancer diagnosed in January 2016.  He had progressive disease in the bone and lung in September 2023, so was placed on docetaxel chemotherapy and continued leuprolide and zoledronic acid.  His PSA went down from 536 to 400.75 after 3 cycles, then to 284 with the 5th cycle, and then 203 in March 2024. PSA then went up to 261 in May. We decided to give him a break from chemotherapy at that time.  We continued leuprolide and zolendronic acid every 3 months. The PSA was 1156. PSMA PET in September 2024 revealed progression of skeletal metastasis. Multiple new radiotracer avid lesions within the spine. Individual lesions are increased in size compared to prior accompanied by sclerotic CT change. 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 No evidence of nodal metastasis or visceral metastasis.   He is receiving palliative chemotherapy with cabazitaxel/prednisone.  He continues leuprolide and zoledronic acid every 3 months.  His PSA has steadily decreased down to 423 in January from a high of 1608 prior to restarting chemotherapy in October 2024, consistent with a response to therapy.  PSA is pending from today.  He has had a recent UTI and continues antibiotic, his symptoms have resolved.  He will proceed with a 7th cycle of cabazitaxel/prednisone today.  He will continue pegfilgrastim on day 3 to prevent complications of prolonged neutropenia.  We will plan to see him back in 3 weeks prior to a 8th cycle.

## 2023-05-04 NOTE — Progress Notes (Signed)
 [facility]  [address] [place],  [place]  [phone]  Clinic Day:  05/04/2023  Referring physician: [name], MD  ASSESSMENT & PLAN:   Assessment & Plan: Malignant neoplasm of prostate [facility]) Stage IVA prostate cancer diagnosed in January 2016.  He had progressive disease in the bone and lung in September 2023, so was placed on docetaxel chemotherapy and continued leuprolide and zoledronic acid.  His PSA went down from 536 to 400.75 after 3 cycles, then to 284 with the 5th cycle, and then 203 in March 2024. PSA then went up to 261 in May. We decided to give him a break from chemotherapy at that time.  We continued leuprolide and zolendronic acid every 3 months. The PSA was 1156. PSMA PET in September 2024 revealed progression of skeletal metastasis. Multiple new radiotracer avid lesions within the spine. Individual lesions are increased in size compared to prior accompanied by sclerotic CT change. 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 No evidence of nodal metastasis or visceral metastasis.   He is receiving palliative chemotherapy with cabazitaxel/prednisone.  He continues leuprolide and zoledronic acid every 3 months.  His PSA has steadily decreased down to 423 in January from a high of 1608 prior to restarting chemotherapy in October 2024, consistent with a response to therapy.  PSA is pending from today.  He has had a recent UTI and continues antibiotic, his symptoms have resolved.  He will proceed with a 7th cycle of cabazitaxel/prednisone today.  He will continue pegfilgrastim on day 3 to prevent complications of prolonged neutropenia.  We will plan to see him back in 3 weeks prior to a 8th cycle.  Secondary malignant neoplasm of bone and bone marrow (HCC) He continues zoledronic acid every 3 months in addition to his cancer therapy.  He is currently receiving chemotherapy with cabazitaxel/prednisone.  He continues zoledronic  acid and leuprolide every 3 months.  They were last given in December.  Anemia He had an upper GI hemorrhage earlier this year and was referred to Dr. [name]. Fortunately, he just had the one episode and was transfused. His hemoglobin came up to 11.1 and his hemoglobin remained stable at 11.3. He had a EGD scheduled in April, but it was canceled. His cardiologist Dr. Allyson Sabal was not comfortable with him stopping clopidogrel for another year. His bleeding has stopped and so we will not pursue an EGD at this time unless he begins having melena again and/or a significant drop in his hemoglobin.  His hemoglobin is stable. He denies obvious blood loss, but could have chronic mild GI losses. Chemotherapy and bone metastasis can be contributing. We will continue to monitor this.    The patient understands the plans discussed today and is in agreement with them.  He knows to contact our office if he develops concerns prior to his next appointment.   I provided 20 minutes of face-to-face time during this encounter and > 50% was spent counseling as documented under my assessment and plan.    Adah Perl, PA-C  [facility] [place] - [facility] DE[facility] [address] [place] [address] Dept: [phone] Dept Fax: [phone]   No orders of the defined types were placed in this encounter.     CHIEF COMPLAINT:  CC: Stage IV prostate cancer  Current Treatment: Cabazitaxel/prednisone/leuprolide/zoledronic acid  HISTORY OF PRESENT ILLNESS:   Oncology History  Malignant neoplasm of prostate (HCC)  03/30/2014 Cancer Staging   Staging form: Prostate, AJCC 8th Edition - Clinical stage from 03/30/2014: Stage IVA (cT3b, cN1, cM0, PSA: 51.8, Grade Group: 4) - Signed by [name], MD on 11/14/2020 Histopathologic type: Adenocarcinoma, NOS Stage prefix: Initial diagnosis Prostate specific antigen (PSA) range: 20 or greater Gleason  primary pattern: 4 Gleason secondary pattern: 4 Gleason score: 8 Histologic grading system: 5 grade system Location of positive needle core biopsies: Beyond primary site Prognostic indicators: May 2016 Robotic prostatectomy with mult. Pos margins, extra prostatic extension, 2/6 pos nodes. Scan June 2016 positive at T10  PSA up to 216.3 by August Placed on lupron and casodex Stage used in treatment planning: Yes National guidelines used in treatment planning: Yes Type of national guideline used in treatment planning: NCCN   02/18/2020 Initial Diagnosis   Malignant neoplasm of prostate (HCC)   12/01/2021 - 07/19/2022 Chemotherapy   Patient is on Treatment Plan : PROSTATE Docetaxel (75) + Prednisone q21d     12/21/2022 -  Chemotherapy   Patient is on Treatment Plan : PROSTATE Cabazitaxel (20) D1 + Prednisone D1-21 q21d     Secondary malignant neoplasm of bone and bone marrow (HCC)  02/18/2020 Initial Diagnosis   Secondary malignant neoplasm of bone and bone marrow (HCC)   12/01/2021 - 07/19/2022 Chemotherapy   Patient is on Treatment Plan : PROSTATE Docetaxel (75) + Prednisone q21d     12/21/2022 -  Chemotherapy   Patient is on Treatment Plan : PROSTATE Cabazitaxel (20) D1 + Prednisone D1-21 q21d     Malignant neoplasm of prostate metastatic to lung (HCC)  11/25/2021 Initial Diagnosis   Malignant neoplasm of prostate metastatic to lung (HCC)   12/21/2022 -  Chemotherapy   Patient is on Treatment Plan : PROSTATE Cabazitaxel (20) D1 + Prednisone D1-21 q21d         INTERVAL HISTORY:  Marvin Johnson is here today for repeat clinical assessment prior to 7th cycle of cabazitaxel/prednisone.  He reports being diagnosed a UTI last week.  His symptoms have resolved.  He continues antibiotics.  He reports occasional dry heaves/vomiting, as well as constipation.  He reports pruritic rash of the upper anterior chest and lower neck, for which triamcinolone cream is effective.  He denies fevers or chills. He  denies pain. His appetite is decreased. His weight has decreased 5 pounds over last 3 weeks .  He is not using any nutritional supplements, mainly due to cost.  We will give him some samples today.  He can also try Carn[employer] instant breakfast.  His friend, [name], just had his kidney removed yesterday.  REVIEW OF SYSTEMS:  Review of Systems  Constitutional:  Negative for appetite change, chills, fatigue, fever and unexpected weight change.  HENT:   Negative for lump/mass, mouth sores and sore throat.   Respiratory:  Negative for cough and shortness of breath.   Cardiovascular:  Negative for chest pain and leg swelling.  Gastrointestinal:  Positive for constipation and vomiting. Negative for abdominal pain, diarrhea and nausea.  Genitourinary:  Negative for difficulty urinating, dysuria, frequency and hematuria.   Musculoskeletal:  Negative for arthralgias, back pain and myalgias.  Skin:  Positive for rash.  Neurological:  Negative for dizziness, extremity weakness, headaches, light-headedness and numbness.  Hematological:  Negative for adenopathy.  Psychiatric/Behavioral:  Negative for depression and sleep disturbance. The patient is not nervous/anxious.      VITALS:  Blood pressure 124/74, pulse 64, temperature (!) 97.5 F (36.4 C), temperature source Oral, resp. rate 18,  height 5\' 7"  (1.702 m), weight 182 lb (82.6 kg), SpO2 98%.  Wt Readings from Last 3 Encounters:  05/04/23 182 lb (82.6 kg)  04/13/23 187 lb 12.8 oz (85.2 kg)  03/25/23 188 lb 1.3 oz (85.3 kg)    Body mass index is 28.51 kg/m.  Performance status (ECOG): 1 - Symptomatic but completely ambulatory  PHYSICAL EXAM:  Physical Exam Vitals and nursing note reviewed.  Constitutional:      General: He is not in acute distress.    Appearance: Normal appearance. He is normal weight. He is not ill-appearing.  HENT:     Head: Normocephalic and atraumatic.     Mouth/Throat:     Mouth: Mucous membranes are moist.     Pharynx:  Oropharynx is clear. No oropharyngeal exudate or posterior oropharyngeal erythema.  Eyes:     General: No scleral icterus.    Extraocular Movements: Extraocular movements intact.     Conjunctiva/sclera: Conjunctivae normal.     Pupils: Pupils are equal, round, and reactive to light.  Cardiovascular:     Rate and Rhythm: Normal rate and regular rhythm.     Heart sounds: Normal heart sounds. No murmur heard.    No friction rub. No gallop.  Pulmonary:     Effort: Pulmonary effort is normal.     Breath sounds: Normal breath sounds. No wheezing, rhonchi or rales.  Abdominal:     General: Bowel sounds are normal. There is no distension.     Palpations: Abdomen is soft. There is no hepatomegaly, splenomegaly or mass.     Tenderness: There is no abdominal tenderness.  Musculoskeletal:        General: Normal range of motion.     Cervical back: Normal range of motion and neck supple. No tenderness.     Right lower leg: No edema.     Left lower leg: No edema.  Lymphadenopathy:     Cervical: No cervical adenopathy.     Upper Body:     Right upper body: No supraclavicular or axillary adenopathy.     Left upper body: No supraclavicular or axillary adenopathy.     Lower Body: No right inguinal adenopathy. No left inguinal adenopathy.  Skin:    General: Skin is warm and dry.     Coloration: Skin is not jaundiced.     Findings: Rash (scattered erythematous rash of upper anterior chest/lower neck) present.  Neurological:     Mental Status: He is alert and oriented to person, place, and time.     Cranial Nerves: No cranial nerve deficit.  Psychiatric:        Mood and Affect: Mood normal.        Behavior: Behavior normal.        Thought Content: Thought content normal.     LABS:      Latest Ref Rng & Units 05/04/2023    8:30 AM 04/13/2023    8:28 AM 03/24/2023    8:28 AM  CBC  WBC 4.0 - 10.5 K/uL 12.4  11.1  7.8   Hemoglobin 13.0 - 17.0 g/dL 16.1  09.6  04.5   Hematocrit 39.0 - 52.0 %  34.7  35.8  33.4   Platelets 150 - 400 K/uL 280  204  264       Latest Ref Rng & Units 05/04/2023    8:30 AM 04/13/2023    8:28 AM 03/24/2023    8:28 AM  CMP  Glucose 70 - 99 mg/dL 96  409  125   BUN 8 - 23 mg/dL 15  17  13    Creatinine 0.61 - 1.24 mg/dL 1.61  0.96  0.45   Sodium 135 - 145 mmol/L 139  142  140   Potassium 3.5 - 5.1 mmol/L 4.0  3.4  3.2   Chloride 98 - 111 mmol/L 104  105  103   CO2 22 - 32 mmol/L 25  26  24    Calcium 8.9 - 10.3 mg/dL 9.0  9.8  9.1   Total Protein 6.5 - 8.1 g/dL 6.7  6.6  6.7   Total Bilirubin 0.0 - 1.2 mg/dL 0.3  0.3  0.5   Alkaline Phos 38 - 126 U/L 79  77  71   AST 15 - 41 U/L 24  23  29    ALT 0 - 44 U/L 16  15  17        Lab Results  Component Value Date   PSA1 1,362.0 (H) 02/01/2023    Lab Results  Component Value Date   TIBC 398 01/29/2022   FERRITIN 206 01/29/2022   IRONPCTSAT 25 01/29/2022     STUDIES:  No results found.    HISTORY:   Past Medical History:  Diagnosis Date   Hyperlipidemia    Hypokalemia 03/24/2023   Malignant neoplasm of prostate (HCC)    Supraventricular tachycardia (HCC)     Past Surgical History:  Procedure Laterality Date   CORONARY STENT INTERVENTION N/A 02/08/2022   Procedure: CORONARY STENT INTERVENTION;  Surgeon: Yvonne Kendall, MD;  Location: [facility] INVASIVE CV LAB;  Service: Cardiovascular;  Laterality: N/A;   LEFT HEART CATH AND CORONARY ANGIOGRAPHY N/A 02/08/2022   Procedure: LEFT HEART CATH AND CORONARY ANGIOGRAPHY;  Surgeon: Yvonne Kendall, MD;  Location: [facility] INVASIVE CV LAB;  Service: Cardiovascular;  Laterality: N/A;   PROSTATECTOMY  07/2014   TONSILLECTOMY      Family History  Problem Relation Age of Onset   Breast cancer Mother 103   Ovarian cancer Mother 76   Breast cancer Paternal Grandmother 78    Social History:  reports that he has never smoked. He has never used smokeless tobacco. He reports that he does not currently use alcohol. He reports that he does not use drugs.The  patient is alone today.  Allergies:  Allergies  Allergen Reactions   Atorvastatin    Rosuvastatin Other (See Comments)    Other reaction(s): Muscle pain   Simvastatin Other (See Comments)    Other reaction(s): Joint pain, Muscle pain   Sulfa Antibiotics    Sulfamethoxazole-Trimethoprim Other (See Comments)    Unknown as to interaction was a baby.    Current Medications: Current Outpatient Medications  Medication Sig Dispense Refill   Alirocumab (PRALUENT Sidman) Inject into the skin. One injection every 2 weeks Monday     APPLE CIDER VINEGAR PO Take by mouth. (Patient not taking: Reported on 01/06/2023)     Cholecalciferol 25 MCG (1000 UT) tablet Take 1,000 Units by mouth in the morning and at bedtime.     clopidogrel (PLAVIX) 75 MG tablet Take 75 mg by mouth daily.     diphenhydrAMINE (BENADRYL) 25 mg capsule Take 1 capsule by mouth at bedtime.     diphenhydrAMINE (BENADRYL) 50 MG capsule Take 50 mg by mouth at bedtime as needed.     ezetimibe (ZETIA) 10 MG tablet Take 10 mg by mouth daily.     fluconazole (DIFLUCAN) 100 MG tablet Take 1 tablet (100 mg total) by mouth daily. (Patient not taking:  Reported on 01/06/2023) 10 tablet 0   guaiFENesin-codeine 100-10 MG/5ML syrup Take 5-10 mLs by mouth every 4 (four) hours as needed.     isosorbide mononitrate (IMDUR) 30 MG 24 hr tablet Take 1 tablet by mouth every 8 (eight) hours as needed.     leuprolide (LUPRON) 22.5 MG injection Inject 22.5 mg into the muscle every 3 (three) months.     metoprolol tartrate (LOPRESSOR) 25 MG tablet Take 12.5 mg by mouth 2 (two) times daily.     morphine (MS CONTIN) 15 MG 12 hr tablet Take 1 tablet (15 mg total) by mouth 2 (two) times daily. 60 tablet 0   Multiple Vitamins-Minerals (MULTIVITAMIN WITH MINERALS) tablet Take 1 tablet by mouth daily.     nitroGLYCERIN (NITROSTAT) 0.4 MG SL tablet Place 0.4 mg under the tongue every 5 (five) minutes as needed for chest pain. Repeat every 5 minutes if needed for a  total of 3 tablets in 15 minutes. If no relief, CALL 911.     Omega-3 Fatty Acids (FISH OIL) 1000 MG CAPS Take 2,000 mg by mouth in the morning and at bedtime.     ondansetron (ZOFRAN) 8 MG tablet Take 8 mg by mouth every 8 (eight) hours as needed for nausea or vomiting.     polyethylene glycol powder (GLYCOLAX/MIRALAX) 17 GM/SCOOP powder Take 1 Container by mouth once.     potassium chloride (KLOR-CON M) 10 MEQ tablet Take 1 tablet (10 mEq total) by mouth daily. 30 tablet 3   predniSONE (DELTASONE) 5 MG tablet Take 1 tablet (5 mg total) by mouth 2 (two) times daily with a meal. 60 tablet 5   prochlorperazine (COMPAZINE) 10 MG tablet Take 1 tablet (10 mg total) by mouth every 6 (six) hours as needed for nausea or vomiting. 30 tablet 2   Royal Jelly-Bee Pollen-Ginseng (KOREAN GINSENG COMPLEX) 150-250-50 MG CAPS Take 1 capsule by mouth daily.     triamcinolone (KENALOG) 0.025 % cream Apply 1 Application topically 2 (two) times daily. 30 g 5   Zoledronic Acid (ZOMETA) 4 MG/100ML IVPB Inject 4 mg into the vein every 3 (three) months.     No current facility-administered medications for this visit.   Facility-Administered Medications Ordered in Other Visits  Medication Dose Route Frequency Provider Last Rate Last Admin   cabazitaxel (JEVTANA) 41 mg in sodium chloride 0.9 % 250 mL chemo infusion  20 mg/m2 (Treatment Plan Recorded) Intravenous Once [name], MD       famotidine (PEPCID) IVPB 20 mg premix  20 mg Intravenous Once [name], MD       heparin lock flush 100 unit/mL  500 Units Intracatheter Once PRN [name], MD       sodium chloride flush (NS) 0.9 % injection 10 mL  10 mL Intravenous PRN [name], MD   10 mL at 03/17/23 0953   sodium chloride flush (NS) 0.9 % injection 10 mL  10 mL Intracatheter PRN [name], MD

## 2023-05-06 ENCOUNTER — Inpatient Hospital Stay: Payer: No Typology Code available for payment source

## 2023-05-06 VITALS — BP 112/69 | HR 61 | Temp 97.8°F | Resp 18

## 2023-05-06 DIAGNOSIS — C61 Malignant neoplasm of prostate: Secondary | ICD-10-CM

## 2023-05-06 DIAGNOSIS — C7951 Secondary malignant neoplasm of bone: Secondary | ICD-10-CM

## 2023-05-06 DIAGNOSIS — Z5111 Encounter for antineoplastic chemotherapy: Secondary | ICD-10-CM | POA: Diagnosis not present

## 2023-05-06 MED ORDER — PEGFILGRASTIM-FPGK 6 MG/0.6ML ~~LOC~~ SOSY
6.0000 mg | PREFILLED_SYRINGE | Freq: Once | SUBCUTANEOUS | Status: AC
  Administered 2023-05-06: 6 mg via SUBCUTANEOUS
  Filled 2023-05-06: qty 0.6

## 2023-05-06 NOTE — Patient Instructions (Signed)

## 2023-05-10 ENCOUNTER — Telehealth: Payer: Self-pay | Admitting: Dietician

## 2023-05-10 NOTE — Telephone Encounter (Signed)
 Patient screened on MST. First attempt to reach. Provided my cell# on voice mail to return call to set up a nutrition consult.  Gennaro Africa, RDN, LDN Registered Dietitian, [facility] Part Time Remote (Usual office hours: Tuesday-Thursday) Cell: [phone]

## 2023-05-11 ENCOUNTER — Ambulatory Visit: Payer: No Typology Code available for payment source

## 2023-05-11 ENCOUNTER — Other Ambulatory Visit: Payer: No Typology Code available for payment source

## 2023-05-11 ENCOUNTER — Ambulatory Visit: Payer: No Typology Code available for payment source | Admitting: Oncology

## 2023-05-12 ENCOUNTER — Telehealth: Payer: Self-pay | Admitting: Dietician

## 2023-05-12 NOTE — Telephone Encounter (Signed)
Patient screened on MST. Second attempt to reach. Provided my cell# on voice mail to return call to set up a nutrition consult.  April Manson, RDN, LDN Registered Dietitian, [facility] Part Time Remote (Usual office hours: Tuesday-Thursday) Cell: [phone]

## 2023-05-13 ENCOUNTER — Telehealth: Payer: Self-pay | Admitting: Dietician

## 2023-05-13 NOTE — Telephone Encounter (Signed)
 05/13/23 Spoke with patient and he requested that he does not need nutrition appt.

## 2023-05-17 ENCOUNTER — Encounter: Payer: Self-pay | Admitting: Oncology

## 2023-05-24 NOTE — Progress Notes (Signed)
 [facility]  [address] [place],  [place]  [address] [phone]  Clinic Day: 05/25/23  Referring physician: [name], MD  ASSESSMENT & PLAN:   Assessment: Malignant neoplasm of prostate [facility]) Stage IVA prostate cancer diagnosed in January 2016.  He had progressive disease in the bone and lung in September 2023, so was placed on docetaxel chemotherapy and continued leuprolide and zoledronic acid.  His PSA went down from 536 to 400.75 after 3 cycles, then to 284 with the 5th cycle, and then 203 in March 2024. PSA then went up to 261 in May. We decided to give him a break from chemotherapy at that time.  We continued leuprolide and zolendronic acid every 3 months. The PSA was 1156. PSMA PET in September 2024 revealed progression of skeletal metastasis. Multiple new radiotracer avid lesions within the spine. Individual lesions are increased in size compared to prior accompanied by sclerotic CT change. 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 No evidence of nodal metastasis or visceral metastasis.    He is receiving palliative chemotherapy with cabazitaxel/prednisone.  He continues leuprolide and zoledronic acid every 3 months.  His PSA has steadily decreased down to 423 in January from a high of 1608 prior to restarting chemotherapy in October 2024, consistent with a response to therapy.  PSA is pending from today.  He has had a recent UTI and continues antibiotic, his symptoms have resolved.  He will proceed with a 7th cycle of cabazitaxel/prednisone today.  He will continue pegfilgrastim on day 3 to prevent complications of prolonged neutropenia. Previously his PSA trend dropped from 741.46 to 423.96 but recently increased to 542.00.    Secondary malignant neoplasm of bone and bone marrow (HCC) He continues zoledronic acid every 3 months in addition to his cancer therapy.  He is currently receiving chemotherapy with cabazitaxel/prednisone.  He  continues zoledronic acid and leuprolide every 3 months.  They were last given in December so are due today.   Anemia He had an upper GI hemorrhage earlier this year and was referred to Dr. [name]. Fortunately, he just had the one episode and was transfused. His hemoglobin came up to 11.1 and his hemoglobin remained stable at 11.3. He had a EGD scheduled in April, but it was canceled. His cardiologist Dr. Allyson Sabal was not comfortable with him stopping clopidogrel for another year. His bleeding has stopped and so we will not pursue an EGD at this time unless he begins having melena again and/or a significant drop in his hemoglobin.  His hemoglobin is stable. He denies obvious blood loss, but could have chronic mild GI losses. Chemotherapy and bone metastasis can be contributing. We will continue to monitor this.  Plan: During physical exam his rash of the upper anterior chest presents to be yeast like, I will order fluconazole 200mg  daily for 10 days. He informed me that he has been passing fragments of a kidney stone, he has no history of any prior kidney stones but does have a family history. His day 1 cycle 8 of Jevtana is scheduled on 05/25/2023. He has a stable elevated WBC 13.3, a low hemoglobin of 11.6 up from 11.1, and platelet count 223,000. His CMP is normal and his PSA today is pending and I will call him with the results. Previously his PSA trend dropped from 741.46 to 423.96 but recently increased to 542.00. He will receive his Lupron and Zometa today.  I will see him back in 3 weeks with CBC, CMP,  and PSA. The patient understands the plans discussed today and is in agreement with them.  He knows to contact our office if he develops concerns prior to his next appointment.  I provided 13 minutes of face-to-face time during this encounter and > 50% was spent counseling as documented under my assessment and plan.   [name], MD  [name] - [facility]  DEPT OF [name] [address] [place] [address] Dept: [phone] Dept Fax: [phone]   No orders of the defined types were placed in this encounter.   CHIEF COMPLAINT:  CC: Stage IV prostate cancer  Current Treatment: Cabazitaxel/prednisone/leuprolide/zoledronic acid  HISTORY OF PRESENT ILLNESS:   Oncology History  Malignant neoplasm of prostate (HCC)  03/30/2014 Cancer Staging   Staging form: Prostate, AJCC 8th Edition - Clinical stage from 03/30/2014: Stage IVA (cT3b, cN1, cM0, PSA: 51.8, Grade Group: 4) - Signed by [name], MD on 11/14/2020 Histopathologic type: Adenocarcinoma, NOS Stage prefix: Initial diagnosis Prostate specific antigen (PSA) range: 20 or greater Gleason primary pattern: 4 Gleason secondary pattern: 4 Gleason score: 8 Histologic grading system: 5 grade system Location of positive needle core biopsies: Beyond primary site Prognostic indicators: May 2016 Robotic prostatectomy with mult. Pos margins, extra prostatic extension, 2/6 pos nodes. Scan June 2016 positive at T10  PSA up to 216.3 by August Placed on lupron and casodex Stage used in treatment planning: Yes National guidelines used in treatment planning: Yes Type of national guideline used in treatment planning: NCCN   02/18/2020 Initial Diagnosis   Malignant neoplasm of prostate (HCC)   12/01/2021 - 07/19/2022 Chemotherapy   Patient is on Treatment Plan : PROSTATE Docetaxel (75) + Prednisone q21d     12/21/2022 -  Chemotherapy   Patient is on Treatment Plan : PROSTATE Cabazitaxel (20) D1 + Prednisone D1-21 q21d     Secondary malignant neoplasm of bone and bone marrow (HCC)  02/18/2020 Initial Diagnosis   Secondary malignant neoplasm of bone and bone marrow (HCC)   12/01/2021 - 07/19/2022 Chemotherapy   Patient is on Treatment Plan : PROSTATE Docetaxel (75) + Prednisone q21d     12/21/2022 -  Chemotherapy   Patient is on Treatment Plan : PROSTATE Cabazitaxel  (20) D1 + Prednisone D1-21 q21d     Malignant neoplasm of prostate metastatic to lung (HCC)  11/25/2021 Initial Diagnosis   Malignant neoplasm of prostate metastatic to lung (HCC)   12/21/2022 -  Chemotherapy   Patient is on Treatment Plan : PROSTATE Cabazitaxel (20) D1 + Prednisone D1-21 q21d      INTERVAL HISTORY:  Marvin Johnson is here today for repeat clinical assessment for his stage IV prostate cancer. Patient states that he feels well but complains of occasional nausea, persistent pruritic rash of the upper anterior chest, and lower back pain rating 1/10. During physical exam his rash presents to be yeast like, I will order fluconazole 200mg  daily for 10 days. He informed me that he has been passing fragments of a kidney stone, he has no history of any prior kidney stones but does have a family history. His day 1 cycle 8 of Jevtana is scheduled on 05/25/2023. He has a stable elevated WBC 13.3, a low hemoglobin of 11.6 up from 11.1, and platelet count 223,000. His CMP is normal and his PSA today is pending and I will call him with the results. Previously his PSA trend dropped from 741.46 to 423.96 but recently increased to 542.00.  I will see him back in 3 weeks with CBC, CMP, and PSA.   He denies signs of infection such as sore throat, sinus drainage, or cough.  He denies fevers or recurrent chills. He denies vomiting, chest pain, dyspnea or cough. His appetite is poor and his weight has decreased 2 pounds over last 3 weeks . This patient is accompanied in the office by his friend.   REVIEW OF SYSTEMS:  Review of Systems  Constitutional: Negative.  Negative for appetite change, chills, diaphoresis, fatigue, fever and unexpected weight change.  HENT:  Negative.  Negative for hearing loss, lump/mass, mouth sores, nosebleeds, sore throat, tinnitus, trouble swallowing and voice change.   Eyes: Negative.  Negative for eye problems and icterus.  Respiratory: Negative.  Negative for chest tightness, cough,  hemoptysis, shortness of breath and wheezing.   Cardiovascular: Negative.  Negative for chest pain, leg swelling and palpitations.  Gastrointestinal:  Positive for nausea (occasional). Negative for abdominal distention, abdominal pain, blood in stool, constipation, diarrhea, rectal pain and vomiting.  Endocrine: Negative.  Negative for hot flashes.  Genitourinary:  Positive for hematuria. Negative for bladder incontinence, difficulty urinating, dyspareunia, dysuria, frequency, nocturia, pelvic pain and penile discharge.        Pain only when passing a kidney stone  Musculoskeletal:  Positive for back pain (1/10). Negative for arthralgias, flank pain, gait problem, myalgias, neck pain and neck stiffness.  Skin:  Positive for rash. Negative for itching and wound.  Neurological: Negative.  Negative for dizziness, extremity weakness, gait problem, headaches, light-headedness, numbness, seizures and speech difficulty.  Hematological: Negative.  Negative for adenopathy. Does not bruise/bleed easily.  Psychiatric/Behavioral: Negative.  Negative for confusion, decreased concentration, depression, sleep disturbance and suicidal ideas. The patient is not nervous/anxious.      VITALS:  Blood pressure 130/64, pulse 65, temperature 98.1 F (36.7 C), temperature source Oral, resp. rate 16, height 5\' 7"  (1.702 m), weight 180 lb 14.4 oz (82.1 kg), SpO2 98%.  Wt Readings from Last 3 Encounters:  05/25/23 180 lb 14.4 oz (82.1 kg)  05/04/23 182 lb (82.6 kg)  04/13/23 187 lb 12.8 oz (85.2 kg)    Body mass index is 28.33 kg/m.  Performance status (ECOG): 1 - Symptomatic but completely ambulatory  PHYSICAL EXAM:  Physical Exam Vitals and nursing note reviewed. Exam conducted with a chaperone present.  Constitutional:      General: He is not in acute distress.    Appearance: Normal appearance. He is normal weight. He is not ill-appearing.  HENT:     Head: Normocephalic and atraumatic.     Right Ear:  Tympanic membrane, ear canal and external ear normal. There is no impacted cerumen.     Left Ear: Tympanic membrane, ear canal and external ear normal. There is no impacted cerumen.     Nose: Nose normal. No congestion or rhinorrhea.     Mouth/Throat:     Mouth: Mucous membranes are moist.     Pharynx: Oropharynx is clear. No oropharyngeal exudate or posterior oropharyngeal erythema.  Eyes:     General: No scleral icterus.    Extraocular Movements: Extraocular movements intact.     Conjunctiva/sclera: Conjunctivae normal.     Pupils: Pupils are equal, round, and reactive to light.  Cardiovascular:     Rate and Rhythm: Normal rate and regular rhythm.     Pulses: Normal pulses.     Heart sounds: Normal heart sounds. No murmur heard.    No friction rub. No gallop.  Pulmonary:     Effort: Pulmonary effort is normal.     Breath sounds: Normal breath sounds. No wheezing, rhonchi or rales.  Abdominal:     General: Bowel sounds are normal. There is no distension.     Palpations: Abdomen is soft. There is no hepatomegaly, splenomegaly or mass.     Tenderness: There is no abdominal tenderness.  Musculoskeletal:        General: Normal range of motion.     Cervical back: Normal range of motion and neck supple. No tenderness.     Right lower leg: No edema.     Left lower leg: No edema.  Lymphadenopathy:     Cervical: No cervical adenopathy.     Upper Body:     Right upper body: No supraclavicular or axillary adenopathy.     Left upper body: No supraclavicular or axillary adenopathy.     Lower Body: No right inguinal adenopathy. No left inguinal adenopathy.  Skin:    General: Skin is warm and dry.     Coloration: Skin is not jaundiced or pale.     Findings: Rash present. No bruising, erythema or lesion.     Comments: A rash of the left upper chest and some on the right that looks like a yeast type rash  Neurological:     General: No focal deficit present.     Mental Status: He is alert and  oriented to person, place, and time. Mental status is at baseline.     Cranial Nerves: No cranial nerve deficit.     Sensory: No sensory deficit.     Motor: No weakness.     Coordination: Coordination normal.     Gait: Gait normal.     Deep Tendon Reflexes: Reflexes normal.  Psychiatric:        Mood and Affect: Mood normal.        Behavior: Behavior normal.        Thought Content: Thought content normal.        Judgment: Judgment normal.     LABS:      Latest Ref Rng & Units 05/25/2023    8:56 AM 05/04/2023    8:30 AM 04/13/2023    8:28 AM  CBC  WBC 4.0 - 10.5 K/uL 13.3  12.4  11.1   Hemoglobin 13.0 - 17.0 g/dL 86.5  78.4  69.6   Hematocrit 39.0 - 52.0 % 36.3  34.7  35.8   Platelets 150 - 400 K/uL 223  280  204       Latest Ref Rng & Units 05/25/2023    8:56 AM 05/04/2023    8:30 AM 04/13/2023    8:28 AM  CMP  Glucose 70 - 99 mg/dL 295  96  284   BUN 8 - 23 mg/dL 18  15  17    Creatinine 0.61 - 1.24 mg/dL 1.32  4.40  1.02   Sodium 135 - 145 mmol/L 138  139  142   Potassium 3.5 - 5.1 mmol/L 3.7  4.0  3.4   Chloride 98 - 111 mmol/L 101  104  105   CO2 22 - 32 mmol/L 26  25  26    Calcium 8.9 - 10.3 mg/dL 9.7  9.0  9.8   Total Protein 6.5 - 8.1 g/dL 6.8  6.7  6.6   Total Bilirubin 0.0 - 1.2 mg/dL 0.3  0.3  0.3   Alkaline Phos 38 - 126 U/L 75  79  77   AST  15 - 41 U/L 20  24  23    ALT 0 - 44 U/L 15  16  15     Lab Results  Component Value Date   PSA1 1,362.0 (H) 02/01/2023   PSA1 1,317.0 (H) 01/06/2023   PSA1 334.0 (H) 10/30/2021   Lab Results  Component Value Date   TIBC 398 01/29/2022   FERRITIN 206 01/29/2022   IRONPCTSAT 25 01/29/2022   Component Ref Range & Units (hover) 05/04/2023 1 mo ago 2 mo ago 3 mo ago  Prostatic Specific Antigen 542.00 High  423.96 High  CM 741.46 High  CM 843.77 High  CM     STUDIES:  No results found.    HISTORY:   Past Medical History:  Diagnosis Date   Hyperlipidemia    Hypokalemia 03/24/2023   Malignant neoplasm of  prostate (HCC)    Supraventricular tachycardia (HCC)     Past Surgical History:  Procedure Laterality Date   CORONARY STENT INTERVENTION N/A 02/08/2022   Procedure: CORONARY STENT INTERVENTION;  Surgeon: Yvonne Kendall, MD;  Location: [facility] INVASIVE CV LAB;  Service: Cardiovascular;  Laterality: N/A;   LEFT HEART CATH AND CORONARY ANGIOGRAPHY N/A 02/08/2022   Procedure: LEFT HEART CATH AND CORONARY ANGIOGRAPHY;  Surgeon: Yvonne Kendall, MD;  Location: [facility] INVASIVE CV LAB;  Service: Cardiovascular;  Laterality: N/A;   PROSTATECTOMY  07/2014   TONSILLECTOMY      Family History  Problem Relation Age of Onset   Breast cancer Mother 84   Ovarian cancer Mother 38   Kidney Stones Sister    Breast cancer Paternal Grandmother 45    Social History:  reports that he has never smoked. He has never used smokeless tobacco. He reports that he does not currently use alcohol. He reports that he does not use drugs.The patient is alone today.  Allergies:  Allergies  Allergen Reactions   Atorvastatin    Rosuvastatin Other (See Comments)    Other reaction(s): Muscle pain   Simvastatin Other (See Comments)    Other reaction(s): Joint pain, Muscle pain   Sulfa Antibiotics    Sulfamethoxazole-Trimethoprim Other (See Comments)    Unknown as to interaction was a baby.    Current Medications: Current Outpatient Medications  Medication Sig Dispense Refill   Alirocumab (PRALUENT [place]) Inject into the skin. One injection every 2 weeks Monday     Cholecalciferol 25 MCG (1000 UT) tablet Take 1,000 Units by mouth in the morning and at bedtime.     clopidogrel (PLAVIX) 75 MG tablet Take 75 mg by mouth daily.     diphenhydrAMINE (BENADRYL) 25 mg capsule Take 1 capsule by mouth at bedtime.     diphenhydrAMINE (BENADRYL) 50 MG capsule Take 50 mg by mouth at bedtime as needed.     ezetimibe (ZETIA) 10 MG tablet Take 10 mg by mouth daily.     fluconazole (DIFLUCAN) 200 MG tablet Take 1 tablet (200 mg total) by  mouth daily. 10 tablet 1   guaiFENesin-codeine 100-10 MG/5ML syrup Take 5-10 mLs by mouth every 4 (four) hours as needed.     isosorbide mononitrate (IMDUR) 30 MG 24 hr tablet Take 1 tablet by mouth every 8 (eight) hours as needed.     leuprolide (LUPRON) 22.5 MG injection Inject 22.5 mg into the muscle every 3 (three) months.     metoprolol tartrate (LOPRESSOR) 25 MG tablet Take 12.5 mg by mouth 2 (two) times daily.     morphine (MS CONTIN) 15 MG 12 hr tablet Take 1  tablet (15 mg total) by mouth 2 (two) times daily. 60 tablet 0   Multiple Vitamins-Minerals (MULTIVITAMIN WITH MINERALS) tablet Take 1 tablet by mouth daily.     nitroGLYCERIN (NITROSTAT) 0.4 MG SL tablet Place 0.4 mg under the tongue every 5 (five) minutes as needed for chest pain. Repeat every 5 minutes if needed for a total of 3 tablets in 15 minutes. If no relief, CALL 911.     Omega-3 Fatty Acids (FISH OIL) 1000 MG CAPS Take 2,000 mg by mouth in the morning and at bedtime.     ondansetron (ZOFRAN) 8 MG tablet Take 8 mg by mouth every 8 (eight) hours as needed for nausea or vomiting.     polyethylene glycol powder (GLYCOLAX/MIRALAX) 17 GM/SCOOP powder Take 1 Container by mouth once.     potassium chloride (KLOR-CON M) 10 MEQ tablet Take 1 tablet (10 mEq total) by mouth daily. 30 tablet 3   predniSONE (DELTASONE) 5 MG tablet Take 1 tablet (5 mg total) by mouth 2 (two) times daily with a meal. 60 tablet 5   prochlorperazine (COMPAZINE) 10 MG tablet Take 1 tablet (10 mg total) by mouth every 6 (six) hours as needed for nausea or vomiting. 30 tablet 2   Royal Jelly-Bee Pollen-Ginseng (KOREAN GINSENG COMPLEX) 150-250-50 MG CAPS Take 1 capsule by mouth daily.     triamcinolone (KENALOG) 0.025 % cream Apply 1 Application topically 2 (two) times daily. 30 g 5   Zoledronic Acid (ZOMETA) 4 MG/100ML IVPB Inject 4 mg into the vein every 3 (three) months.     No current facility-administered medications for this visit.   Facility-Administered  Medications Ordered in Other Visits  Medication Dose Route Frequency Provider Last Rate Last Admin   sodium chloride flush (NS) 0.9 % injection 10 mL  10 mL Intravenous PRN [name], MD   10 mL at 03/17/23 0981    I,Jasmine M Lassiter,acting as a scribe for [name], MD.,have documented all relevant documentation on the behalf of [name], MD,as directed by  [name], MD while in the presence of [name], MD.

## 2023-05-25 ENCOUNTER — Other Ambulatory Visit: Payer: Self-pay | Admitting: Oncology

## 2023-05-25 ENCOUNTER — Telehealth: Payer: Self-pay | Admitting: Oncology

## 2023-05-25 ENCOUNTER — Inpatient Hospital Stay: Payer: No Typology Code available for payment source

## 2023-05-25 ENCOUNTER — Encounter: Payer: Self-pay | Admitting: Oncology

## 2023-05-25 ENCOUNTER — Inpatient Hospital Stay (HOSPITAL_BASED_OUTPATIENT_CLINIC_OR_DEPARTMENT_OTHER): Payer: No Typology Code available for payment source | Admitting: Oncology

## 2023-05-25 ENCOUNTER — Inpatient Hospital Stay: Payer: No Typology Code available for payment source | Attending: Oncology

## 2023-05-25 VITALS — BP 130/64 | HR 65 | Temp 98.1°F | Resp 16 | Ht 67.0 in | Wt 180.9 lb

## 2023-05-25 DIAGNOSIS — C7800 Secondary malignant neoplasm of unspecified lung: Secondary | ICD-10-CM | POA: Diagnosis not present

## 2023-05-25 DIAGNOSIS — Z7952 Long term (current) use of systemic steroids: Secondary | ICD-10-CM | POA: Diagnosis not present

## 2023-05-25 DIAGNOSIS — C61 Malignant neoplasm of prostate: Secondary | ICD-10-CM | POA: Diagnosis not present

## 2023-05-25 DIAGNOSIS — Z87440 Personal history of urinary (tract) infections: Secondary | ICD-10-CM | POA: Diagnosis not present

## 2023-05-25 DIAGNOSIS — D649 Anemia, unspecified: Secondary | ICD-10-CM | POA: Insufficient documentation

## 2023-05-25 DIAGNOSIS — Z5111 Encounter for antineoplastic chemotherapy: Secondary | ICD-10-CM | POA: Insufficient documentation

## 2023-05-25 DIAGNOSIS — C7951 Secondary malignant neoplasm of bone: Secondary | ICD-10-CM

## 2023-05-25 DIAGNOSIS — C7952 Secondary malignant neoplasm of bone marrow: Secondary | ICD-10-CM

## 2023-05-25 DIAGNOSIS — R21 Rash and other nonspecific skin eruption: Secondary | ICD-10-CM | POA: Diagnosis not present

## 2023-05-25 DIAGNOSIS — Z79899 Other long term (current) drug therapy: Secondary | ICD-10-CM | POA: Diagnosis not present

## 2023-05-25 DIAGNOSIS — R310 Gross hematuria: Secondary | ICD-10-CM

## 2023-05-25 DIAGNOSIS — Z79818 Long term (current) use of other agents affecting estrogen receptors and estrogen levels: Secondary | ICD-10-CM | POA: Insufficient documentation

## 2023-05-25 DIAGNOSIS — Z7902 Long term (current) use of antithrombotics/antiplatelets: Secondary | ICD-10-CM | POA: Insufficient documentation

## 2023-05-25 DIAGNOSIS — B372 Candidiasis of skin and nail: Secondary | ICD-10-CM

## 2023-05-25 LAB — URINALYSIS, COMPLETE (UACMP) WITH MICROSCOPIC
Bilirubin Urine: NEGATIVE
Glucose, UA: NEGATIVE mg/dL
Ketones, ur: NEGATIVE mg/dL
Nitrite: NEGATIVE
Protein, ur: 30 mg/dL — AB
RBC / HPF: >50 RBC/hpf (ref 0–5)
Specific Gravity, Urine: 1.02 (ref 1.005–1.030)
pH: 6 (ref 5.0–8.0)

## 2023-05-25 LAB — CMP (CANCER CENTER ONLY)
ALT: 15 U/L (ref 0–44)
AST: 20 U/L (ref 15–41)
Albumin: 4.2 g/dL (ref 3.5–5.0)
Alkaline Phosphatase: 75 U/L (ref 38–126)
Anion gap: 12 (ref 5–15)
BUN: 18 mg/dL (ref 8–23)
CO2: 26 mmol/L (ref 22–32)
Calcium: 9.7 mg/dL (ref 8.9–10.3)
Chloride: 101 mmol/L (ref 98–111)
Creatinine: 0.83 mg/dL (ref 0.61–1.24)
GFR, Estimated: >60 mL/min (ref >60)
Glucose, Bld: 141 mg/dL — ABNORMAL HIGH (ref 70–99)
Potassium: 3.7 mmol/L (ref 3.5–5.1)
Sodium: 138 mmol/L (ref 135–145)
Total Bilirubin: 0.3 mg/dL (ref 0.0–1.2)
Total Protein: 6.8 g/dL (ref 6.5–8.1)

## 2023-05-25 LAB — CBC WITH DIFFERENTIAL (CANCER CENTER ONLY)
Abs Immature Granulocytes: 0.23 10*3/uL — ABNORMAL HIGH (ref 0.00–0.07)
Basophils Absolute: 0.1 10*3/uL (ref 0.0–0.1)
Basophils Relative: 0 %
Eosinophils Absolute: 0 10*3/uL (ref 0.0–0.5)
Eosinophils Relative: 0 %
HCT: 36.3 % — ABNORMAL LOW (ref 39.0–52.0)
Hemoglobin: 11.6 g/dL — ABNORMAL LOW (ref 13.0–17.0)
Immature Granulocytes: 2 %
Immature Platelet Fraction: 1.8 % (ref 1.2–8.6)
Lymphocytes Relative: 29 %
Lymphs Abs: 3.8 10*3/uL (ref 0.7–4.0)
MCH: 29.9 pg (ref 26.0–34.0)
MCHC: 32 g/dL (ref 30.0–36.0)
MCV: 93.6 fL (ref 80.0–100.0)
Monocytes Absolute: 0.8 10*3/uL (ref 0.1–1.0)
Monocytes Relative: 6 %
Neutro Abs: 8.4 10*3/uL — ABNORMAL HIGH (ref 1.7–7.7)
Neutrophils Relative %: 63 %
Platelet Count: 223 10*3/uL (ref 150–400)
RBC: 3.88 MIL/uL — ABNORMAL LOW (ref 4.22–5.81)
RDW: 18.1 % — ABNORMAL HIGH (ref 11.5–15.5)
WBC Count: 13.3 10*3/uL — ABNORMAL HIGH (ref 4.0–10.5)
nRBC: 0 /100{WBCs}
nRBC: 0.04 % (ref 0.0–0.2)

## 2023-05-25 LAB — PSA: Prostatic Specific Antigen: 371.57 ng/mL — ABNORMAL HIGH (ref 0.00–4.00)

## 2023-05-25 MED ORDER — SODIUM CHLORIDE 0.9 % IV SOLN
20.0000 mg/m2 | Freq: Once | INTRAVENOUS | Status: AC
  Administered 2023-05-25: 41 mg via INTRAVENOUS
  Filled 2023-05-25: qty 4.1

## 2023-05-25 MED ORDER — FAMOTIDINE IN NACL 20-0.9 MG/50ML-% IV SOLN
20.0000 mg | Freq: Once | INTRAVENOUS | Status: AC
  Administered 2023-05-25: 20 mg via INTRAVENOUS
  Filled 2023-05-25: qty 50

## 2023-05-25 MED ORDER — ZOLEDRONIC ACID 4 MG/100ML IV SOLN
4.0000 mg | Freq: Once | INTRAVENOUS | Status: AC
  Administered 2023-05-25: 4 mg via INTRAVENOUS
  Filled 2023-05-25: qty 100

## 2023-05-25 MED ORDER — LEUPROLIDE ACETATE (3 MONTH) 22.5 MG IM KIT
22.5000 mg | PACK | Freq: Once | INTRAMUSCULAR | Status: AC
  Administered 2023-05-25: 22.5 mg via INTRAMUSCULAR
  Filled 2023-05-25: qty 22.5

## 2023-05-25 MED ORDER — DEXAMETHASONE SODIUM PHOSPHATE 10 MG/ML IJ SOLN
10.0000 mg | Freq: Once | INTRAMUSCULAR | Status: AC
  Administered 2023-05-25: 10 mg via INTRAVENOUS
  Filled 2023-05-25: qty 1

## 2023-05-25 MED ORDER — SODIUM CHLORIDE 0.9% FLUSH
10.0000 mL | INTRAVENOUS | Status: DC | PRN
Start: 2023-05-25 — End: 2023-05-25
  Administered 2023-05-25: 10 mL

## 2023-05-25 MED ORDER — FLUCONAZOLE 200 MG PO TABS: 200.0000 mg | ORAL_TABLET | Freq: Every day | ORAL | 1 refills | Status: DC

## 2023-05-25 MED ORDER — DIPHENHYDRAMINE HCL 50 MG/ML IJ SOLN
25.0000 mg | Freq: Once | INTRAMUSCULAR | Status: AC
  Administered 2023-05-25: 25 mg via INTRAVENOUS
  Filled 2023-05-25: qty 1

## 2023-05-25 MED ORDER — SODIUM CHLORIDE 0.9 % IV SOLN: Freq: Once | INTRAVENOUS | Status: DC

## 2023-05-25 MED ORDER — HEPARIN SOD (PORK) LOCK FLUSH 100 UNIT/ML IV SOLN
500.0000 [IU] | Freq: Once | INTRAVENOUS | Status: AC | PRN
Start: 2023-05-25 — End: 2023-05-25
  Administered 2023-05-25: 500 [IU]

## 2023-05-25 MED ORDER — SODIUM CHLORIDE 0.9 % IV SOLN: Freq: Once | INTRAVENOUS | Status: AC

## 2023-05-25 NOTE — Patient Instructions (Signed)
 Cabazitaxel Injection What is this medication? CABAZITAXEL (ka BAZ i TAX el) treats prostate cancer. It works by slowing down the growth of cancer cells. This medicine may be used for other purposes; ask your health care provider or pharmacist if you have questions. COMMON BRAND NAME(S): Jevtana What should I tell my care team before I take this medication? They need to know if you have any of these conditions: Kidney problems Liver disease Low white blood cell levels Lung disease Stomach or intestine problems An unusual or allergic reaction to cabazitaxel, polysorbate 80, other medications, foods, dyes, or preservatives Pregnant or trying to get pregnant Breast-feeding How should I use this medication? This medication is injected into a vein. It is given by your care team in a hospital or clinic setting. Talk to your care team about the use of this medication in children. Special care may be needed. Overdosage: If you think you have taken too much of this medicine contact a poison control center or emergency room at once. NOTE: This medicine is only for you. Do not share this medicine with others. What if I miss a dose? Keep appointments for follow-up doses. It is important not to miss your dose. Call your care team if you are unable to keep an appointment. What may interact with this medication? Certain antibiotics, such as clarithromycin or telithromycin Certain antivirals for HIV or AIDS Certain medications for fungal infections like ketoconazole, itraconazole, and voriconazole Nefazodone This list may not describe all possible interactions. Give your health care provider a list of all the medicines, herbs, non-prescription drugs, or dietary supplements you use. Also tell them if you smoke, drink alcohol, or use illegal drugs. Some items may interact with your medicine. What should I watch for while using this medication? This medication may make you feel generally unwell. This is  not uncommon as chemotherapy can affect healthy cells as well as cancer cells. Report any side effects. Continue your course of treatment even though you feel ill unless your care team tells you to stop. You may need blood work while you are taking this medication. This medication may increase your risk of getting an infection. Call your care team for advice if you get a fever, chills, sore throat, or other symptoms of a cold or flu. Do not treat yourself. Try to avoid being around people who are sick. Avoid taking medications that contain aspirin, acetaminophen, ibuprofen, naproxen, or ketoprofen unless instructed by your care team. These medications may hide a fever. Be careful brushing or flossing your teeth or using a toothpick because you may get an infection or bleed more easily. If you have any dental work done, tell your dentist you are receiving this medication. This medication can cause serious infusion reactions. To reduce the risk, your care team may give you other medications to take before receiving this one. Be sure to follow the directions from your care team. Use a condom during sex while taking this medication and for 4 months after the last dose. Talk to your care team right away if your partner may be pregnant. This medication can cause serious birth defects. This medication may cause infertility. Talk to your care team if you are concerned about your fertility. What side effects may I notice from receiving this medication? Side effects that you should report to your care team as soon as possible: Allergic reactions--skin rash, itching, hives, swelling of the face, lips, tongue, or throat Diarrhea, nausea, vomiting Dry cough, shortness of breath or  trouble breathing Infection--fever, chills, cough, or sore throat Kidney injury--decrease in the amount of urine, swelling of the ankles, hands, or feet Pain, tingling, or numbness in the hands or feet Red or dark brown urine Stomach  bleeding--bloody or black, tar-like stools, vomiting blood or brown material that looks like coffee grounds Stomach pain that is severe, does not go away, or gets worse Unusual bruising or bleeding Side effects that usually do not require medical attention (report these to your care team if they continue or are bothersome): Loss of appetite Unusual weakness or fatigue This list may not describe all possible side effects. Call your doctor for medical advice about side effects. You may report side effects to FDA at 1-800-FDA-1088. Where should I keep my medication? This medication is given in a hospital or clinic. It will not be stored at home. NOTE: This sheet is a summary. It may not cover all possible information. If you have questions about this medicine, talk to your doctor, pharmacist, or health care provider.  2024 Elsevier/Gold Standard (2023-02-11 00:00:00)

## 2023-05-25 NOTE — Progress Notes (Signed)
 Urine sent to lab

## 2023-05-25 NOTE — Telephone Encounter (Signed)
 Patient has been scheduled for follow-up visit per 05/25/23 LOS.  Pt given an appt calendar with date and time.

## 2023-05-26 ENCOUNTER — Other Ambulatory Visit: Payer: Self-pay

## 2023-05-26 LAB — URINE CULTURE: Culture: NO GROWTH

## 2023-05-27 ENCOUNTER — Inpatient Hospital Stay: Payer: No Typology Code available for payment source

## 2023-05-27 VITALS — BP 128/74 | HR 67 | Temp 98.0°F | Resp 18

## 2023-05-27 DIAGNOSIS — Z5111 Encounter for antineoplastic chemotherapy: Secondary | ICD-10-CM | POA: Diagnosis not present

## 2023-05-27 DIAGNOSIS — C7951 Secondary malignant neoplasm of bone: Secondary | ICD-10-CM

## 2023-05-27 DIAGNOSIS — C61 Malignant neoplasm of prostate: Secondary | ICD-10-CM

## 2023-05-27 MED ORDER — PEGFILGRASTIM-FPGK 6 MG/0.6ML ~~LOC~~ SOSY
6.0000 mg | PREFILLED_SYRINGE | Freq: Once | SUBCUTANEOUS | Status: AC
  Administered 2023-05-27: 6 mg via SUBCUTANEOUS
  Filled 2023-05-27: qty 0.6

## 2023-05-27 NOTE — Patient Instructions (Signed)

## 2023-05-31 ENCOUNTER — Other Ambulatory Visit: Payer: Self-pay

## 2023-05-31 DIAGNOSIS — C61 Malignant neoplasm of prostate: Secondary | ICD-10-CM

## 2023-05-31 DIAGNOSIS — C7951 Secondary malignant neoplasm of bone: Secondary | ICD-10-CM

## 2023-05-31 DIAGNOSIS — M898X9 Other specified disorders of bone, unspecified site: Secondary | ICD-10-CM

## 2023-05-31 MED ORDER — MORPHINE SULFATE ER 15 MG PO TBCR
15.0000 mg | EXTENDED_RELEASE_TABLET | Freq: Two times a day (BID) | ORAL | 0 refills | Status: DC
Start: 2023-05-31 — End: 2023-07-01

## 2023-06-01 ENCOUNTER — Encounter: Payer: Self-pay | Admitting: Oncology

## 2023-06-03 ENCOUNTER — Encounter: Payer: Self-pay | Admitting: Oncology

## 2023-06-06 ENCOUNTER — Other Ambulatory Visit: Payer: Self-pay

## 2023-06-06 DIAGNOSIS — Z79899 Other long term (current) drug therapy: Secondary | ICD-10-CM

## 2023-06-06 DIAGNOSIS — C61 Malignant neoplasm of prostate: Secondary | ICD-10-CM

## 2023-06-06 MED ORDER — PREDNISONE 5 MG PO TABS
5.0000 mg | ORAL_TABLET | Freq: Two times a day (BID) | ORAL | 5 refills | Status: DC
Start: 2023-06-06 — End: 2023-12-29

## 2023-06-08 ENCOUNTER — Ambulatory Visit: Payer: No Typology Code available for payment source

## 2023-06-14 ENCOUNTER — Encounter: Payer: Self-pay | Admitting: Oncology

## 2023-06-15 ENCOUNTER — Other Ambulatory Visit: Payer: Self-pay | Admitting: Pharmacist

## 2023-06-15 ENCOUNTER — Inpatient Hospital Stay: Attending: Oncology

## 2023-06-15 ENCOUNTER — Inpatient Hospital Stay (HOSPITAL_BASED_OUTPATIENT_CLINIC_OR_DEPARTMENT_OTHER): Admitting: Hematology and Oncology

## 2023-06-15 ENCOUNTER — Encounter: Payer: Self-pay | Admitting: Oncology

## 2023-06-15 ENCOUNTER — Encounter: Payer: Self-pay | Admitting: Hematology and Oncology

## 2023-06-15 ENCOUNTER — Inpatient Hospital Stay

## 2023-06-15 VITALS — BP 124/65 | HR 67 | Temp 98.2°F | Resp 18 | Ht 67.0 in | Wt 178.8 lb

## 2023-06-15 DIAGNOSIS — C7951 Secondary malignant neoplasm of bone: Secondary | ICD-10-CM | POA: Insufficient documentation

## 2023-06-15 DIAGNOSIS — Z803 Family history of malignant neoplasm of breast: Secondary | ICD-10-CM | POA: Insufficient documentation

## 2023-06-15 DIAGNOSIS — R319 Hematuria, unspecified: Secondary | ICD-10-CM | POA: Diagnosis not present

## 2023-06-15 DIAGNOSIS — D649 Anemia, unspecified: Secondary | ICD-10-CM | POA: Insufficient documentation

## 2023-06-15 DIAGNOSIS — C61 Malignant neoplasm of prostate: Secondary | ICD-10-CM | POA: Diagnosis present

## 2023-06-15 DIAGNOSIS — Z79899 Other long term (current) drug therapy: Secondary | ICD-10-CM | POA: Diagnosis not present

## 2023-06-15 DIAGNOSIS — Z9221 Personal history of antineoplastic chemotherapy: Secondary | ICD-10-CM | POA: Insufficient documentation

## 2023-06-15 DIAGNOSIS — Z7952 Long term (current) use of systemic steroids: Secondary | ICD-10-CM | POA: Diagnosis not present

## 2023-06-15 DIAGNOSIS — E876 Hypokalemia: Secondary | ICD-10-CM | POA: Insufficient documentation

## 2023-06-15 DIAGNOSIS — Z8041 Family history of malignant neoplasm of ovary: Secondary | ICD-10-CM | POA: Insufficient documentation

## 2023-06-15 DIAGNOSIS — E785 Hyperlipidemia, unspecified: Secondary | ICD-10-CM | POA: Insufficient documentation

## 2023-06-15 DIAGNOSIS — M546 Pain in thoracic spine: Secondary | ICD-10-CM | POA: Insufficient documentation

## 2023-06-15 DIAGNOSIS — R9341 Abnormal radiologic findings on diagnostic imaging of renal pelvis, ureter, or bladder: Secondary | ICD-10-CM

## 2023-06-15 DIAGNOSIS — C7952 Secondary malignant neoplasm of bone marrow: Secondary | ICD-10-CM

## 2023-06-15 LAB — CMP (CANCER CENTER ONLY)
ALT: 20 U/L (ref 0–44)
AST: 27 U/L (ref 15–41)
Albumin: 4.1 g/dL (ref 3.5–5.0)
Alkaline Phosphatase: 82 U/L (ref 38–126)
Anion gap: 11 (ref 5–15)
BUN: 17 mg/dL (ref 8–23)
CO2: 24 mmol/L (ref 22–32)
Calcium: 9.5 mg/dL (ref 8.9–10.3)
Chloride: 104 mmol/L (ref 98–111)
Creatinine: 0.89 mg/dL (ref 0.61–1.24)
GFR, Estimated: >60 mL/min (ref >60)
Glucose, Bld: 130 mg/dL — ABNORMAL HIGH (ref 70–99)
Potassium: 3.9 mmol/L (ref 3.5–5.1)
Sodium: 139 mmol/L (ref 135–145)
Total Bilirubin: 0.3 mg/dL (ref 0.0–1.2)
Total Protein: 6.6 g/dL (ref 6.5–8.1)

## 2023-06-15 LAB — CBC WITH DIFFERENTIAL (CANCER CENTER ONLY)
Abs Immature Granulocytes: 0.13 10*3/uL — ABNORMAL HIGH (ref 0.00–0.07)
Basophils Absolute: 0 10*3/uL (ref 0.0–0.1)
Basophils Relative: 0 %
Eosinophils Absolute: 0 10*3/uL (ref 0.0–0.5)
Eosinophils Relative: 0 %
HCT: 36 % — ABNORMAL LOW (ref 39.0–52.0)
Hemoglobin: 11.1 g/dL — ABNORMAL LOW (ref 13.0–17.0)
Immature Granulocytes: 1 %
Lymphocytes Relative: 18 %
Lymphs Abs: 2.5 10*3/uL (ref 0.7–4.0)
MCH: 28.9 pg (ref 26.0–34.0)
MCHC: 30.8 g/dL (ref 30.0–36.0)
MCV: 93.8 fL (ref 80.0–100.0)
Monocytes Absolute: 1.1 10*3/uL — ABNORMAL HIGH (ref 0.1–1.0)
Monocytes Relative: 8 %
Neutro Abs: 10.2 10*3/uL — ABNORMAL HIGH (ref 1.7–7.7)
Neutrophils Relative %: 73 %
Platelet Count: 218 10*3/uL (ref 150–400)
RBC: 3.84 MIL/uL — ABNORMAL LOW (ref 4.22–5.81)
RDW: 18.7 % — ABNORMAL HIGH (ref 11.5–15.5)
WBC Count: 13.9 10*3/uL — ABNORMAL HIGH (ref 4.0–10.5)
nRBC: 0 % (ref 0.0–0.2)
nRBC: 0 /100{WBCs}

## 2023-06-15 LAB — PSA: Prostatic Specific Antigen: 426.49 ng/mL — ABNORMAL HIGH (ref 0.00–4.00)

## 2023-06-15 MED ORDER — SODIUM CHLORIDE 0.9% FLUSH
10.0000 mL | INTRAVENOUS | Status: AC | PRN
  Administered 2023-06-15: 10 mL

## 2023-06-15 MED ORDER — HEPARIN SOD (PORK) LOCK FLUSH 100 UNIT/ML IV SOLN
500.0000 [IU] | Freq: Once | INTRAVENOUS | Status: AC | PRN
  Administered 2023-06-15: 500 [IU]

## 2023-06-15 NOTE — Progress Notes (Signed)
 [facility], [facility]  [address] [place],  [place]  [address] [phone]  Clinic Day:  06/16/2023  Referring physician: Paniagua, Tiziana, MD  ASSESSMENT & PLAN:   Assessment & Plan: Malignant neoplasm of prostate [facility]) Stage IVA prostate cancer diagnosed in January 2016.  He had progressive disease in the bone and lung in September 2023, so was placed on docetaxel  chemotherapy and continued leuprolide  and zoledronic  acid.  His PSA went down from 536 to 400.75 after 3 cycles, then to 284 with the 5th cycle, and then 203 in March 2024. PSA then went up to 261 in May. We decided to give him a break from chemotherapy at that time.  We continued leuprolide  and zolendronic acid every 3 months. The PSA was 1156. PSMA PET in September 2024 revealed progression of skeletal metastasis. Multiple new radiotracer avid lesions within the spine. Individual lesions are increased in size compared to prior accompanied by sclerotic CT change. 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 No evidence of nodal metastasis or visceral metastasis.   He is receiving palliative chemotherapy with cabazitaxel /prednisone .  He continues leuprolide  and zoledronic  acid every 3 months.  The PSA has fluctuated up and down somewhat, but had decreased to 371.57 in March from 542 in February.  PSA is pending from today.    He was in the ER at [facility] Monday as he states had suprapubic pressure and then he developed hematuria.  CTA abdomen and CT urogram were done and revealed increased bilateral ureteral mucosal enhancement with periureteral fat stranding, which may reflect underlying urinary tract infection. Please correlate with urinalysis. No evidence of pyelonephritis. Mild bilateral hydroureter, left greater than right, with a possible enhancing 3 mm lesion within the mid right ureter. No urinary tract calculi. Further evaluation with retrograde evaluation may be useful. Limited evaluation  of the bladder due to decompressed state. Possible asymmetric right inferolateral bladder wall thickening. Further evaluation with cystoscopy may be useful. 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   He states he is scheduled to see urology in [facility] April 10.  Due to the need for invasive procedure, I recommend holding cycle 9 of chemotherapy today.  I will plan to follow-up on urology's findings.  He has a follow-up scheduled in 3 weeks prior to possibly resuming chemotherapy, which we will keep.  Secondary malignant neoplasm of bone and bone marrow (HCC) He continues zoledronic  acid every 3 months in addition to his cancer therapy.  He is currently receiving chemotherapy with cabazitaxel /prednisone .  He continues zoledronic  acid and leuprolide  every 3 months.  He received both of those on March 12.  Abnormal radiologic findings on diagnostic imaging of renal pelvis, ureter, or bladder CTA abdomen and CT urogram were done in the ER at [facility] earlier this week to evaluate hematuria.  These revealed increased bilateral ureteral mucosal enhancement with periureteral fat stranding, which may reflect underlying urinary tract infection. Please correlate with urinalysis. No evidence of pyelonephritis. Mild bilateral hydroureter, left greater than right, with a possible enhancing 3 mm lesion within the mid right ureter. No urinary tract calculi. Further evaluation with retrograde evaluation may be useful. Limited evaluation of the bladder due to decompressed state. Possible asymmetric right inferolateral bladder wall thickening. Further evaluation with cystoscopy may be useful. 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  He is scheduled to see urology in [facility] on April 10.  Anemia He had an upper GI hemorrhage earlier this year and was referred to Dr. Monico [facility]. Fortunately, he just had the one episode and was  transfused. His hemoglobin came up to 11.1 and has remained stable. He had a EGD scheduled in April, but it was canceled. His cardiologist [facility] was not comfortable with him stopping clopidogrel  for another year. His bleeding has stopped and so we will not pursue an EGD at this time unless he begins having melena again and/or a significant drop in his hemoglobin.  His hemoglobin is stable. He denies obvious blood loss, but could have chronic mild GI losses. Chemotherapy and bone metastasis can be contributing. We will continue to monitor this.    The patient understands the plans discussed today and is in agreement with them.  He knows to contact our office if he develops concerns prior to his next appointment.   I provided 20 minutes of face-to-face time during this encounter and > 50% was spent counseling as documented under my assessment and plan.    Marvin Dant A Vinny Taranto, PA-C  [facility] [address] [place] [address] Dept: [phone] Dept Fax: [phone]   No orders of the defined types were placed in this encounter.     CHIEF COMPLAINT:  CC: Metastatic prostate cancer  Current Treatment: Cabazitaxel /prednisone /leuprolide /zoledronic  acid  HISTORY OF PRESENT ILLNESS:   Oncology History  Malignant neoplasm of prostate (HCC)  03/30/2014 Cancer Staging   Staging form: Prostate, AJCC 8th Edition - Clinical stage from 03/30/2014: Stage IVA (cT3b, cN1, cM0, PSA: 51.8, Grade Group: 4) - Signed by Marvin Baumgartner, MD on 11/14/2020 Histopathologic type: Adenocarcinoma, NOS Stage prefix: Initial diagnosis Prostate specific antigen (PSA) range: 20 or greater Gleason primary pattern: 4 Gleason secondary pattern: 4 Gleason score: 8 Histologic grading Johnson: 5 grade Johnson Location of positive needle core biopsies: Beyond primary site Prognostic indicators: May 2016 Robotic prostatectomy with mult.  Pos margins, extra prostatic extension, 2/6 pos nodes. Scan June 2016 positive at T10  PSA up to 216.3 by August Placed on lupron  and casodex Stage used in treatment planning: Yes National guidelines used in treatment planning: Yes Type of national guideline used in treatment planning: NCCN   02/18/2020 Initial Diagnosis   Malignant neoplasm of prostate (HCC)   12/01/2021 - 07/19/2022 Chemotherapy   Patient is on Treatment Plan : PROSTATE Docetaxel  (75) + Prednisone  q21d     12/21/2022 -  Chemotherapy   Patient is on Treatment Plan : PROSTATE Cabazitaxel  (20) D1 + Prednisone  D1-21 q21d     Secondary malignant neoplasm of bone and bone marrow (HCC)  02/18/2020 Initial Diagnosis   Secondary malignant neoplasm of bone and bone marrow (HCC)   12/01/2021 - 07/19/2022 Chemotherapy   Patient is on Treatment Plan : PROSTATE Docetaxel  (75) + Prednisone  q21d     12/21/2022 -  Chemotherapy   Patient is on Treatment Plan : PROSTATE Cabazitaxel  (20) D1 + Prednisone  D1-21 q21d     Malignant neoplasm of prostate metastatic to lung (HCC)  11/25/2021 Initial Diagnosis   Malignant neoplasm of prostate metastatic to lung (HCC)   12/21/2022 -  Chemotherapy   Patient is on Treatment Plan : PROSTATE Cabazitaxel  (20) D1 + Prednisone  D1-21 q21d         INTERVAL HISTORY:  [facility] is here today for repeat clinical assessment prior to 9th cycle of cabazitaxel .  He was in the ED at  [facility] overnight on Monday due to persistent suprapubic pressure and new hematuria.  He had significant leukocytosis with WBCs 20,000.  He had changes on CTA abdomen/CT urogram within the bladder and right ureter, so has been referred to urology.  He was felt to have possible pyelonephritis.  He was treated and observed overnight.  He was discharged on oral antibiotics.  He states he still has mild hematuria, but improvement in the suprapubic pressure.  Urine culture returned negative today. He states he continues prednisone  twice daily.   He denies fevers or chills. He reports achy mid back pain. His appetite is good. His Marvin has decreased 2 pounds over last 3 weeks .  He states he has an appointment with Urology in [facility] on April 10.  REVIEW OF SYSTEMS:  Review of Systems  Constitutional:  Negative for appetite change, chills, diaphoresis, fatigue, fever and unexpected Marvin change.  HENT:   Negative for lump/mass, mouth sores and sore throat.   Respiratory:  Negative for cough and shortness of breath.   Cardiovascular:  Negative for chest pain and leg swelling.  Gastrointestinal:  Negative for abdominal pain, constipation, diarrhea, nausea and vomiting.  Genitourinary:  Positive for hematuria. Negative for bladder incontinence, difficulty urinating, dysuria and frequency.   Musculoskeletal:  Positive for back pain (mid back). Negative for arthralgias and myalgias.  Skin:  Negative for itching, rash and wound.  Neurological:  Negative for dizziness, extremity weakness, headaches, light-headedness and numbness.  Hematological:  Negative for adenopathy. Does not bruise/bleed easily.  Psychiatric/Behavioral:  Negative for depression and sleep disturbance. The patient is not nervous/anxious.      VITALS:  Blood pressure 124/65, pulse 67, temperature 98.2 F (36.8 C), temperature source Oral, resp. rate 18, height 5\' 7"  (1.702 m), Marvin 178 lb 12.8 oz (81.1 kg), SpO2 99%.  Wt Readings from Last 3 Encounters:  06/15/23 178 lb 12.8 oz (81.1 kg)  05/25/23 180 lb 14.4 oz (82.1 kg)  05/04/23 182 lb (82.6 kg)    Body mass index is 28 kg/m.  Performance status (ECOG): 1 - Symptomatic but completely ambulatory  PHYSICAL EXAM:  Physical Exam Vitals and nursing note reviewed.  Constitutional:      General: He is not in acute distress.    Appearance: Normal appearance. He is normal Marvin. He is not ill-appearing.  HENT:     Head: Normocephalic and atraumatic.     Mouth/Throat:     Mouth: Mucous membranes are moist.      Pharynx: Oropharynx is clear. No oropharyngeal exudate or posterior oropharyngeal erythema.  Eyes:     General: No scleral icterus.    Extraocular Movements: Extraocular movements intact.     Conjunctiva/sclera: Conjunctivae normal.     Pupils: Pupils are equal, round, and reactive to light.  Cardiovascular:     Rate and Rhythm: Normal rate and regular rhythm.     Heart sounds: Normal heart sounds. No murmur heard.    No friction rub. No gallop.  Pulmonary:     Effort: Pulmonary effort is normal.     Breath sounds: Normal breath sounds. No wheezing, rhonchi or rales.  Abdominal:     General: Bowel sounds are normal. There is no distension.     Palpations: Abdomen is soft. There is no hepatomegaly, splenomegaly or mass.     Tenderness: There is no abdominal tenderness.  Musculoskeletal:        General: Normal range of motion.     Cervical back: Normal range  of motion and neck supple. No tenderness.     Right lower leg: No edema.     Left lower leg: No edema.  Lymphadenopathy:     Cervical: No cervical adenopathy.     Upper Body:     Right upper body: No supraclavicular or axillary adenopathy.     Left upper body: No supraclavicular or axillary adenopathy.     Lower Body: No right inguinal adenopathy. No left inguinal adenopathy.  Skin:    General: Skin is warm and dry.     Coloration: Skin is not jaundiced.     Findings: No rash.  Neurological:     Mental Status: He is alert and oriented to person, place, and time.     Cranial Nerves: No cranial nerve deficit.  Psychiatric:        Mood and Affect: Mood normal.        Behavior: Behavior normal.        Thought Content: Thought content normal.     LABS:      Latest Ref Rng & Units 06/15/2023   10:56 AM 05/25/2023    8:56 AM 05/04/2023    8:30 AM  CBC  WBC 4.0 - 10.5 K/uL 13.9  13.3  12.4   Hemoglobin 13.0 - 17.0 g/dL 29.5  62.1  30.8   Hematocrit 39.0 - 52.0 % 36.0  36.3  34.7   Platelets 150 - 400 K/uL 218  223  280        Latest Ref Rng & Units 06/15/2023   10:56 AM 05/25/2023    8:56 AM 05/04/2023    8:30 AM  CMP  Glucose 70 - 99 mg/dL 657  846  96   BUN 8 - 23 mg/dL 17  18  15    Creatinine 0.61 - 1.24 mg/dL 9.62  9.52  8.41   Sodium 135 - 145 mmol/L 139  138  139   Potassium 3.5 - 5.1 mmol/L 3.9  3.7  4.0   Chloride 98 - 111 mmol/L 104  101  104   CO2 22 - 32 mmol/L 24  26  25    Calcium 8.9 - 10.3 mg/dL 9.5  9.7  9.0   Total Protein 6.5 - 8.1 g/dL 6.6  6.8  6.7   Total Bilirubin 0.0 - 1.2 mg/dL 0.3  0.3  0.3   Alkaline Phos 38 - 126 U/L 82  75  79   AST 15 - 41 U/L 27  20  24    ALT 0 - 44 U/L 20  15  16      Lab Results  Component Value Date   PSA1 1,362.0 (H) 02/01/2023    Lab Results  Component Value Date   TIBC 398 01/29/2022   FERRITIN 206 01/29/2022   IRONPCTSAT 25 01/29/2022     STUDIES:  No results found.    HISTORY:   Past Medical History:  Diagnosis Date   Hyperlipidemia    Hypokalemia 03/24/2023   Malignant neoplasm of prostate (HCC)    Supraventricular tachycardia (HCC)     Past Surgical History:  Procedure Laterality Date   CORONARY STENT INTERVENTION N/A 02/08/2022   Procedure: CORONARY STENT INTERVENTION;  Surgeon: Sammy Crisp, MD;  Location: [facility] INVASIVE CV LAB;  Service: Cardiovascular;  Laterality: N/A;   LEFT HEART CATH AND CORONARY ANGIOGRAPHY N/A 02/08/2022   Procedure: LEFT HEART CATH AND CORONARY ANGIOGRAPHY;  Surgeon: Sammy Crisp, MD;  Location: [facility] INVASIVE CV LAB;  Service: Cardiovascular;  Laterality: N/A;  PROSTATECTOMY  07/2014   TONSILLECTOMY      Family History  Problem Relation Age of Onset   Breast cancer Mother 59   Ovarian cancer Mother 80   Kidney Stones Sister    Breast cancer Paternal Grandmother 78    Social History:  reports that he has never smoked. He has never used smokeless tobacco. He reports that he does not currently use alcohol. He reports that he does not use drugs.The patient is alone today.  Allergies:   Allergies  Allergen Reactions   Atorvastatin    Rosuvastatin Other (See Comments)    Other reaction(s): Muscle pain   Simvastatin Other (See Comments)    Other reaction(s): Joint pain, Muscle pain   Sulfa Antibiotics    Sulfamethoxazole-Trimethoprim Other (See Comments)    Unknown as to interaction was a baby.    Current Medications: Current Outpatient Medications  Medication Sig Dispense Refill   cefpodoxime (VANTIN) 100 MG tablet Take by mouth.     Alirocumab (PRALUENT [place]) Inject into the skin. One injection every 2 weeks Monday     Cholecalciferol 25 MCG (1000 UT) tablet Take 1,000 Units by mouth in the morning and at bedtime.     clopidogrel  (PLAVIX ) 75 MG tablet Take 75 mg by mouth daily.     diphenhydrAMINE  (BENADRYL ) 25 mg capsule Take 1 capsule by mouth at bedtime.     diphenhydrAMINE  (BENADRYL ) 50 MG capsule Take 50 mg by mouth at bedtime as needed.     ezetimibe  (ZETIA ) 10 MG tablet Take 10 mg by mouth daily.     fluconazole  (DIFLUCAN ) 200 MG tablet Take 1 tablet (200 mg total) by mouth daily. 10 tablet 1   guaiFENesin-codeine 100-10 MG/5ML syrup Take 5-10 mLs by mouth every 4 (four) hours as needed.     isosorbide mononitrate (IMDUR) 30 MG 24 hr tablet Take 1 tablet by mouth every 8 (eight) hours as needed.     leuprolide  (LUPRON ) 22.5 MG injection Inject 22.5 mg into the muscle every 3 (three) months.     metoprolol  tartrate (LOPRESSOR ) 25 MG tablet Take 12.5 mg by mouth 2 (two) times daily.     morphine  (MS CONTIN ) 15 MG 12 hr tablet Take 1 tablet (15 mg total) by mouth 2 (two) times daily. 60 tablet 0   Multiple Vitamins-Minerals (MULTIVITAMIN WITH MINERALS) tablet Take 1 tablet by mouth daily.     nitroGLYCERIN  (NITROSTAT ) 0.4 MG SL tablet Place 0.4 mg under the tongue every 5 (five) minutes as needed for chest pain. Repeat every 5 minutes if needed for a total of 3 tablets in 15 minutes. If no relief, CALL 911.     Omega-3 Fatty Acids (FISH OIL) 1000 MG CAPS Take 2,000  mg by mouth in the morning and at bedtime.     ondansetron  (ZOFRAN ) 8 MG tablet Take 8 mg by mouth every 8 (eight) hours as needed for nausea or vomiting.     polyethylene glycol powder (GLYCOLAX/MIRALAX) 17 GM/SCOOP powder Take 1 Container by mouth once.     potassium chloride  (KLOR-CON  M) 10 MEQ tablet Take 1 tablet (10 mEq total) by mouth daily. 30 tablet 3   predniSONE  (DELTASONE ) 5 MG tablet Take 1 tablet (5 mg total) by mouth 2 (two) times daily with a meal. 60 tablet 5   prochlorperazine  (COMPAZINE ) 10 MG tablet Take 1 tablet (10 mg total) by mouth every 6 (six) hours as needed for nausea or vomiting. 30 tablet 2   Royal Jelly-Bee Pollen-Ginseng (  KOREAN GINSENG COMPLEX) 150-250-50 MG CAPS Take 1 capsule by mouth daily.     triamcinolone  (KENALOG ) 0.025 % cream Apply 1 Application topically 2 (two) times daily. 30 g 5   Zoledronic  Acid (ZOMETA ) 4 MG/100ML IVPB Inject 4 mg into the vein every 3 (three) months.     No current facility-administered medications for this visit.   Facility-Administered Medications Ordered in Other Visits  Medication Dose Route Frequency Provider Last Rate Last Admin   sodium chloride  flush (NS) 0.9 % injection 10 mL  10 mL Intravenous PRN Marvin Baumgartner, MD   10 mL at 03/17/23 0953   sodium chloride  flush (NS) 0.9 % injection 10 mL  10 mL Intracatheter PRN Marvin Baumgartner, MD   10 mL at 06/15/23 1234

## 2023-06-15 NOTE — Assessment & Plan Note (Signed)
 Stage IVA prostate cancer diagnosed in January 2016.  He had progressive disease in the bone and lung in September 2023, so was placed on docetaxel chemotherapy and continued leuprolide and zoledronic acid.  His PSA went down from 536 to 400.75 after 3 cycles, then to 284 with the 5th cycle, and then 203 in March 2024. PSA then went up to 261 in May. We decided to give him a break from chemotherapy at that time.  We continued leuprolide and zolendronic acid every 3 months. The PSA was 1156. PSMA PET in September 2024 revealed progression of skeletal metastasis. Multiple new radiotracer avid lesions within the spine. Individual lesions are increased in size compared to prior accompanied by sclerotic CT change. 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 No evidence of nodal metastasis or visceral metastasis.   He is receiving palliative chemotherapy with cabazitaxel/prednisone.  He continues leuprolide and zoledronic acid every 3 months.  The PSA has fluctuated up and down somewhat, but had decreased to 371.57 in March from 542 in February.  PSA is pending from today.    He was in the ER at [facility] Monday as he states had suprapubic pressure and then he developed hematuria.  CTA abdomen and CT urogram were done and revealed increased bilateral ureteral mucosal enhancement with periureteral fat stranding, which may reflect underlying urinary tract infection. Please correlate with urinalysis. No evidence of pyelonephritis. Mild bilateral hydroureter, left greater than right, with a possible enhancing 3 mm lesion within the mid right ureter. No urinary tract calculi. Further evaluation with retrograde evaluation may be useful. Limited evaluation of the bladder due to decompressed state. Possible asymmetric right inferolateral bladder wall thickening. Further evaluation with cystoscopy may be useful. 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   He  states he is scheduled to see urology in [facility] April 10.  Due to the need for invasive procedure, I recommend holding cycle 9 of chemotherapy today.  I will plan to follow-up on urology's findings.  He has a follow-up scheduled in 3 weeks prior to possibly resuming chemotherapy, which we will keep.

## 2023-06-15 NOTE — Addendum Note (Signed)
 Addended by: Garnette Scheuermann on: 06/15/2023 12:35 PM   Modules accepted: Orders

## 2023-06-16 ENCOUNTER — Encounter: Payer: Self-pay | Admitting: Oncology

## 2023-06-16 ENCOUNTER — Encounter: Payer: Self-pay | Admitting: Hematology and Oncology

## 2023-06-16 DIAGNOSIS — R9341 Abnormal radiologic findings on diagnostic imaging of renal pelvis, ureter, or bladder: Secondary | ICD-10-CM | POA: Insufficient documentation

## 2023-06-16 NOTE — Assessment & Plan Note (Signed)
 He had an upper GI hemorrhage earlier this year and was referred to Dr. [name]. Fortunately, he just had the one episode and was transfused. His hemoglobin came up to 11.1 and has remained stable. He had a EGD scheduled in April, but it was canceled. His cardiologist Dr. Allyson Sabal was not comfortable with him stopping clopidogrel for another year. His bleeding has stopped and so we will not pursue an EGD at this time unless he begins having melena again and/or a significant drop in his hemoglobin.  His hemoglobin is stable. He denies obvious blood loss, but could have chronic mild GI losses. Chemotherapy and bone metastasis can be contributing. We will continue to monitor this.

## 2023-06-16 NOTE — Assessment & Plan Note (Addendum)
 CTA abdomen and CT urogram were done in the ER at [facility] earlier this week to evaluate hematuria.  These revealed increased bilateral ureteral mucosal enhancement with periureteral fat stranding, which may reflect underlying urinary tract infection. Please correlate with urinalysis. No evidence of pyelonephritis. Mild bilateral hydroureter, left greater than right, with a possible enhancing 3 mm lesion within the mid right ureter. No urinary tract calculi. Further evaluation with retrograde evaluation may be useful. Limited evaluation of the bladder due to decompressed state. Possible asymmetric right inferolateral bladder wall thickening. Further evaluation with cystoscopy may be useful. 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  He is scheduled to see urology in [facility] on April 10.

## 2023-06-16 NOTE — Assessment & Plan Note (Addendum)
 He continues zoledronic acid every 3 months in addition to his cancer therapy.  He is currently receiving chemotherapy with cabazitaxel/prednisone.  He continues zoledronic acid and leuprolide every 3 months.  He received both of those on March 12.

## 2023-06-17 ENCOUNTER — Inpatient Hospital Stay

## 2023-06-23 ENCOUNTER — Telehealth: Payer: Self-pay

## 2023-06-23 NOTE — Telephone Encounter (Signed)
   Name: [facility]  DOB: 12-14-1950  MRN: [id number]  Primary Cardiologist: Nanetta Batty, MD  Chart reviewed as part of pre-operative protocol coverage. Because of [name]'s past medical history and time since last visit, he will require a follow-up in-office visit in order to better assess preoperative cardiovascular risk.  Pre-op covering staff: - Please schedule appointment and call patient to inform them. If patient already had an upcoming appointment within acceptable timeframe, please add "pre-op clearance" to the appointment notes so provider is aware. - Please contact requesting surgeon's office via preferred method (i.e, phone, fax) to inform them of need for appointment prior to surgery.   Napoleon Form, Leodis Rains, NP  06/23/2023, 12:35 PM

## 2023-06-23 NOTE — Telephone Encounter (Signed)
 Pt has been scheduled 07/11/23 at 8:50 at our new location for preop clearance. Appt with Robin Searing, NP.

## 2023-06-23 NOTE — Telephone Encounter (Signed)
   Pre-operative Risk Assessment    Patient Name: Marvin [facility]  DOB: 1950-08-07 MRN: [id number]   Date of last office visit: 06/29/22 Nanetta Batty, MD Date of next office visit: NONE   Request for Surgical Clearance    Procedure:   CYSTOSCOPY, BILATERAL RETROGRADA  PYELOGRAM, BILATERAL URETERSCOPY, URETERAL BIOPSY AND LASER  Date of Surgery:  Clearance 07/21/23                                Surgeon:  DR Gretchen Short Surgeon's Group or Practice Name:  [facility] Phone number:  [phone] Fax number:  [phone] / [phone]   Type of Clearance Requested:   - Medical  - Pharmacy:  Hold Clopidogrel (Plavix) 5 DAY  PRIOR TO SURGERY, 1 DAY POST OP   Type of Anesthesia:  Not Indicated   Additional requests/questions:    Signed, Marlow Baars   06/23/2023, 12:27 PM

## 2023-06-24 ENCOUNTER — Other Ambulatory Visit: Payer: Self-pay

## 2023-06-30 ENCOUNTER — Other Ambulatory Visit: Payer: Self-pay

## 2023-07-01 ENCOUNTER — Other Ambulatory Visit: Payer: Self-pay

## 2023-07-01 DIAGNOSIS — M898X9 Other specified disorders of bone, unspecified site: Secondary | ICD-10-CM

## 2023-07-01 DIAGNOSIS — C61 Malignant neoplasm of prostate: Secondary | ICD-10-CM

## 2023-07-01 DIAGNOSIS — C7951 Secondary malignant neoplasm of bone: Secondary | ICD-10-CM

## 2023-07-01 MED ORDER — MORPHINE SULFATE ER 15 MG PO TBCR: 15.0000 mg | EXTENDED_RELEASE_TABLET | Freq: Two times a day (BID) | ORAL | 0 refills | Status: DC

## 2023-07-05 NOTE — Progress Notes (Signed)
 [facility]  [address] [place],  [place]  [address] [phone]  Clinic Day:  07/06/23  Referring physician: Paniagua, Tiziana, MD  ASSESSMENT & PLAN:  Assessment: Malignant neoplasm of prostate [facility]) Stage IVA prostate cancer diagnosed in January 2016.  He had progressive disease in the bone and lung in September 2023, so was placed on docetaxel  chemotherapy and continued leuprolide  and zoledronic  acid.  His PSA went down from 536 to 400.75 after 3 cycles, then to 284 with the 5th cycle, and then 203 in March 2024. PSA then went up to 261 in May. We decided to give him a break from chemotherapy at that time.  We continued leuprolide  and zolendronic acid every 3 months. The PSA was 1156. PSMA PET in September 2024 revealed progression of skeletal metastasis, multiple new radiotracer avid lesions within the spine, individual lesions are increased in size compared to prior, accompanied by sclerotic CT change and widespread skeletal metastasis involving the axillary skeleton. No evidence of nodal metastasis or visceral metastasis. He is now receiving palliative chemotherapy with cabazitaxel /prednisone .  He continues leuprolide  and zoledronic  acid every 3 months.  The PSA has fluctuated up and down somewhat, but had decreased to 371.57 in March from 542 in February., then increased to 426.49 as of 06/15/2023.  PSA is pending from today.     Hematuria/Abnormal radiologic findings on diagnostic imaging of renal pelvis, ureter, or bladder He was in the ER at [facility] Monday as he states had suprapubic pressure and then he developed hematuria.  CTA abdomen and CT urogram were done and revealed increased bilateral ureteral mucosal enhancement with periureteral fat stranding, which may reflect underlying urinary tract infection. Please correlate with urinalysis. No evidence of pyelonephritis. Mild bilateral hydroureter, left greater than right, with a possible enhancing 3 mm lesion within the  mid right ureter. No urinary tract calculi. Further evaluation with retrograde evaluation may be useful. Limited evaluation of the bladder due to decompressed state. Possible asymmetric right inferolateral bladder wall thickening. He has been evaluated by Dr. Traci Fridge urologist with [facility] and cysto was negative. They also repeated a CT urogram as noted below and she plans cystoscopy, bilateral retrograde pyelogram, bilateral ureteroscopy with possible biopsy and laser ablation, and possible stent placement which is scheduled on May, 8th.    Secondary malignant neoplasm of bone and bone marrow (HCC) He continues zoledronic  acid every 3 months in addition to his cancer therapy.  He is currently receiving chemotherapy with cabazitaxel /prednisone .  He continues zoledronic  acid and leuprolide  every 3 months.  He received both of those on March 12. He had CT scans done in April, 2025 which revealed stable diffuse bony metastasis.   Anemia He had an upper GI hemorrhage earlier this year and was referred to Dr. Monico Anna. Fortunately, he just had the one episode and was transfused. His hemoglobin came up to 11.1 and has remained stable. He had a EGD scheduled in April, but it was canceled. His cardiologist Dr. Katheryne Pane was not comfortable with him stopping clopidogrel  for another year. His bleeding has stopped and so we will not pursue an EGD at this time unless he begins having melena again and/or a significant drop in his hemoglobin.  His hemoglobin is stable. He denies obvious blood loss, but could have chronic mild GI losses. Chemotherapy and bone metastasis can be contributing. We will continue to monitor this but his hemoglobin has improved since his chemotherapy was held.  Plan:  He informed me that he was  experiencing hematuria and dysuria about a month ago, he went to [place] ER on 06/01/2023 and had a CT urogram which showed left hydroureteronephrosis but no stone, there was a new sclerotic lesion of  the pubic bone on the left, "new" right middle lobe nodules. On 06/13/2023 he went to [facility] in [facility] and CT angio abdomen and was found to have found mild bilateral hydroureter, L>R, with a possible enhancing 3 mm lesion within the mid right ureter, increased bilateral ureteral mucosal enhancement with peri ureteral fat stranding, and possible asymmetric right inferolateral bladder wall thickening, and stable diffuse bony metastasis. Further evaluation with cystoscopy was recommended. CT urogram on 06/14/2023 revealed again stable osseous metastases and several small nodules that measure up to 7 mm at the right lung base.  06/20/2023 he met with urologist Dr. Reissmann and she did a cystoscopy which was clear. Since having the cystoscopy, he states that the pain and bleeding has stopped. She recommends cystoscopy, bilateral retrograde pyelogram, bilateral ureteroscopy with possible biopsy and laser ablation, and possible stent placement which is scheduled on May, 8th. I feel as we should hold chemo if he is getting surgery done in 2 weeks. I will call Dr. Traci Fridge to clarify and confirm if this is the best option. Patient informed me that he has appointment with cardiology on 07/13/2023 before having surgery for clearance.  He has a WBC of 7.8, low hemoglobin of 11.6 improved from 11.1, and platelet count of 206,000. His CEA is completely normal and his PSA today is pending. His PSA went down in March at 371.57 but has increased to 426.49 as of 06/15/2023. We will hold his Jevtana  today until further notice and he will wil be due for his Lupron  and Zometa  injection in June. I will see him back in about 3 weeks with CBC, CMP, and PSA to reevaluate and decide when to resume chemotherapy. The patient understands the plans discussed today and is in agreement with them.  He knows to contact our office if he develops concerns prior to his next appointment.   I provided 30 minutes of face-to-face time during  this encounter and > 50% was spent counseling as documented under my assessment and plan.   [name], MD  [name] - A DEPT OF [facility] [address] [place] [address] Dept: [phone] Dept Fax: [phone]   No orders of the defined types were placed in this encounter.   CHIEF COMPLAINT:  CC: Metastatic prostate cancer  Current Treatment: Cabazitaxel /prednisone /leuprolide /zoledronic  acid  HISTORY OF PRESENT ILLNESS:  Marvin Johnson is a 73 y.o.  male with a history of stage IV (T3b N1 M0) prostate cancer diagnosed in January 2016 when he was found to have a PSA of 51.8.  He was treated with a laparoscopic prostatectomy in May 2016.  Pathology revealed adenocarcinoma with a Gleason 8.  There was a positive posterior margin with extraprostatic extension, as well as left retro-urethral tissue positive and extension to the seminal vesicles bilaterally with multiple positive margins.  Two lymph nodes from the right side were negative, but 2/4 lymph nodes from the left side were positive for metastasis. Repeat bone scan in June 2016 revealed a new lesion at T10, which was not present on his baseline bone scan.  PSA at Dr. Donavan Fuchs office in early August 2016 remained elevated at 56.4.   We began seeing him in August 2016. The PSA was up  to 216.3 in late August 2016, so hormonal therapy was recommended  X-rays of the thoracic spine revealed osteoarthritis and scoliosis, as well as evidence of demineralization, but we could not visualize a metastatic lesion.Aaron Aas He started leuprolide  in September 2016.  The PSA initially dropped steadily with leuprolide .  Bone density scan in November 2016 revealed osteopenia, with a T-score of -2.1 in the spine and a T-score of -0.9 in the femur.  He had been taking vitamin-D 1000 international units daily, but not calcium, so calcium 600 mg twice daily was added at that time.     Leuprolide  was held in February 2017 because of chest pain.  EKG was unremarkable.  He went to the [place] and he was referred to the [facility] for placement of 2 coronary artery stents for 95% and 99% occlusions.  He then had a third coronary artery stent placed later in the summer.  Leuprolide  was resumed in March 2017.  In September 2017, he had an increase in the PSA, so we repeated a bone scan.  This revealed intense uptake within the right posterior elements of T10, but no other areas of metastasis.  We then added bicalutamide 50 mg daily to his leuprolide .     He had a steadily increasing PSA despite the bicalutamide, so this was discontinued in March 2018.  He eventually was placed on MS Contin  15 mg twice daily, with oxycodone  10 mg every 4 hours as needed for breakthrough pain.  MRI thoracic spine in April 2018 revealed increasing tumor at T10 with epidural spread.  Bone scan at that time revealed increased activity in the T10 lesion, which was stable.  There was a questionable tiny focus in the right ischium/acetabular rim.  Plain x-rays of the bilateral hips and pelvis did not reveal any focal abnormality. The PSA increased from 27.5 in March, to 72.2 in May, then to 92.2 in June 2018.  He was referred for radiotherapy to the T10 lesion due to the severe pain with improvement in his pain. He was started on zoledronic  acid along with his leuprolide  in June 2018.  He had a Port-A-Cath placed in anticipation of chemotherapy, but then we recommended that he be placed on enzalutamide  160 mg daily instead, in order to delay the time to initiation of chemotherapy.  He started enzalutamide  in June 2018.  The PSA became undetectable in October 2018.  We continued MS Contin , but discontinued the oxycodone . He did not tolerate meloxicam, so uses Tylenol  as needed for breakthrough pain.     He had a stress test in January 2020 and was referred to [facility] for coronary artery stenting, which was  done in February.  Zoledronic  acid had been given monthly for over 2 years, so was changed to every 3 months in October 2020.   The PSA started to increase in January 2021 at which time it was 0.2.  CT chest, abdomen and pelvis in March 2021 not reveal any lymphadenopathy or soft tissue metastatic disease.  Hepatic steatosis was seen.  There was a stable sclerotic osseous lesion of the T10 vertebral body, as well as a subtle sclerotic lesion of the L3 vertebral body, also possibly a small metastatic lesion.  Whole body bone scan did not reveal continued increased activity of T10 and right acetabulum posteriorly.  The PSA was 0.5 in March.  PSA had increased to 3.0 in June.  Axumin  PET imaging from July did not reveal any evidence of active prostate carcinoma,  local recurrence, or metastatic disease. The PSA continued to slowly rise and was up to 5.9 in September, 10.9 in October, and 16.3 in December.     The PSA went up to 23.81 in March 2022, so an Axumin  PET scan was ordered which revealed an oligometastatic skeletal lesion in the anterior right 7th rib with very subtle sclerotic change at this level. PSA in June decreased to 18.6, but then in September increased to 58.  In view of the significant rise in his PSA, a prostate specific PET scan was ordered.  PSMA PET in October 2022 revealed a persistent focus of intense radiotracer activity in the anterior RIGHT seventh rib with increased in activity from comparison exam and subtle sclerotic changes on CT. Findings remain consistent with oligometastatic skeletal metastasis. Sclerotic lesions in the T10 vertebral body without radiotracer activity potentially represented prior treated prostate cancer metastasis.  We recommended he continue his current treatment with enzalutamide , leuprolide  and zoledronic  acid.  PSA in November continued to increase to 83.49.  Even though his PSA continued to steadily increase, we did not recommend a change in therapy based on  this alone.  He had no severe pain and no increase in pain and was having a good quality of life.     The PSA has continued to fluctuate up and down and was 31 in February and 78 in May of 2023. It then increased dramatically to 334 in August and he was scheduled for a PSMA PET scan.  This revealed severe progression of disease with several tiny lung nodules which did have hypermetabolic activity, with SUV up to 5.  He had increased bone lesions which are now innumerable, including major lesions at L3, the right iliac wing, and the left pubic bone. These were not visible on CT scan. He stopped the Enzalutamide  and started the Taxotere  chemotherapy in September 2023 and is tolerating this well. His PSA went down from 536 to 400.75 after 3 cycles and went down to 284 with the 5th cycle. It was down to 203 by March of 2024. He then had a gastrointestinal hemorrhage with melena and drop of his hemoglobin from 12 to 7.0 on March 11th. He was hospitalized for transfusion but EGD was not done due to needing to stay on Plavix  after his most recent MI.  Oncology History  Malignant neoplasm of prostate (HCC)  03/30/2014 Cancer Staging   Staging form: Prostate, AJCC 8th Edition - Clinical stage from 03/30/2014: Stage IVA (cT3b, cN1, cM0, PSA: 51.8, Grade Group: 4) - Signed by [name], MD on 11/14/2020 Histopathologic type: Adenocarcinoma, NOS Stage prefix: Initial diagnosis Prostate specific antigen (PSA) range: 20 or greater Gleason primary pattern: 4 Gleason secondary pattern: 4 Gleason score: 8 Histologic grading system: 5 grade system Location of positive needle core biopsies: Beyond primary site Prognostic indicators: May 2016 Robotic prostatectomy with mult. Pos margins, extra prostatic extension, 2/6 pos nodes. Scan June 2016 positive at T10  PSA up to 216.3 by August Placed on lupron  and casodex Stage used in treatment planning: Yes National guidelines used in treatment planning:  Yes Type of national guideline used in treatment planning: NCCN   02/18/2020 Initial Diagnosis   Malignant neoplasm of prostate (HCC)   12/01/2021 - 07/19/2022 Chemotherapy   Patient is on Treatment Plan : PROSTATE Docetaxel  (73) + Prednisone  q21d     12/21/2022 -  Chemotherapy   Patient is on Treatment Plan : PROSTATE Cabazitaxel  (20) D1 + Prednisone  D1-21 q21d  Secondary malignant neoplasm of bone and bone marrow (HCC)  02/18/2020 Initial Diagnosis   Secondary malignant neoplasm of bone and bone marrow (HCC)   12/01/2021 - 07/19/2022 Chemotherapy   Patient is on Treatment Plan : PROSTATE Docetaxel  (75) + Prednisone  q21d     12/21/2022 -  Chemotherapy   Patient is on Treatment Plan : PROSTATE Cabazitaxel  (20) D1 + Prednisone  D1-21 q21d     Malignant neoplasm of prostate metastatic to lung (HCC)  11/25/2021 Initial Diagnosis   Malignant neoplasm of prostate metastatic to lung (HCC)   12/21/2022 -  Chemotherapy   Patient is on Treatment Plan : PROSTATE Cabazitaxel  (20) D1 + Prednisone  D1-21 q21d       INTERVAL HISTORY:  Marvin Johnson is here today for repeat clinical assessment for his stage IV prostate cancer. Patient states that he feels well and has no complaints of pain. He informed me that he was experiencing hematuria and dysuria about a month ago, he went to [place] ER on 06/01/2023 and had a CT urogram which showed left hydroureteronephrosis but no stone, there was a new sclerotic lesion of the pubic bone on the left, "new" right middle lobe nodules. On 06/13/2023 he went to [facility] and CT angio abdomen and was found to have found mild bilateral hydroureter, L>R, with a possible enhancing 3 mm lesion within the mid right ureter, increased bilateral ureteral mucosal enhancement with peri ureteral fat stranding, and possible asymmetric right inferolateral bladder wall thickening, and stable diffuse bony metastasis. Further evaluation with cystoscopy was recommended. CT urogram on  06/14/2023 revealed again stable osseous metastases and several small nodules that measure up to 7 mm at the right lung base.  06/20/2023 he met with urologist Dr. Reissmann and she did a cystoscopy which was clear. Since having the cystoscopy he states that since then the pain and bleeding has stopped. She recommended cystoscopy, bilateral retrograde pyelogram, bilateral ureteroscopy with possible biopsy and laser ablation, and possible stent placement which is scheduled on May, 8th. I feel we should hold chemo if he is getting surgery done in 2 weeks. I will call Dr. Traci Fridge to clarify and confirm if this is the best option. Patient informed me that he has appointment with cardiology on 07/13/2023 before having surgery for clearance.  He has a WBC of 7.8, low hemoglobin of 11.6 improved from 11.1, and platelet count of 206,000. His CEA is completely normal and his PSA today is pending. His PSA went down in March at 371.57 but has increase to 426.49 as of 06/15/2023. We will hold his Jevtana  today until further notice and he will be due for his Lupron  and Zometa  injection in June. I will see him back in about 3 weeks with CBC, CMP, and PSA to reevaluate and decide when to resume chemotherapy.   He denies signs of infection such as sore throat, sinus drainage, cough.  He denies fevers or recurrent chills. He denies pain. He denies nausea, vomiting, chest pain, dyspnea or cough. His appetite is improved and his weight has increased 3 pounds over last 3 weeks .   REVIEW OF SYSTEMS:  Review of Systems  Constitutional:  Negative for appetite change, chills, diaphoresis, fatigue, fever and unexpected weight change.  HENT:  Negative.  Negative for lump/mass, mouth sores and sore throat.   Eyes: Negative.   Respiratory: Negative.  Negative for chest tightness, cough, hemoptysis, shortness of breath and wheezing.   Cardiovascular: Negative.  Negative for chest pain, leg swelling  and palpitations.   Gastrointestinal:  Negative for abdominal distention, abdominal pain, blood in stool, constipation, diarrhea, nausea and vomiting.  Endocrine: Negative.  Negative for hot flashes.  Genitourinary:  Positive for dysuria and hematuria. Negative for bladder incontinence, difficulty urinating and frequency.   Musculoskeletal:  Positive for back pain (mid back). Negative for arthralgias, flank pain, gait problem and myalgias.  Skin: Negative.  Negative for itching, rash and wound.  Neurological: Negative.  Negative for dizziness, extremity weakness, gait problem, headaches, light-headedness, numbness, seizures and speech difficulty.  Hematological: Negative.  Negative for adenopathy. Does not bruise/bleed easily.  Psychiatric/Behavioral: Negative.  Negative for depression and sleep disturbance. The patient is not nervous/anxious.      VITALS:  Blood pressure 129/74, pulse 61, temperature 97.9 F (36.6 C), temperature source Oral, resp. rate 18, height 5\' 7"  (1.702 m), weight 181 lb 12.8 oz (82.5 kg), SpO2 98%.  Wt Readings from Last 3 Encounters:  07/13/23 184 lb 6.4 oz (83.6 kg)  07/06/23 181 lb 12.8 oz (82.5 kg)  06/15/23 178 lb 12.8 oz (81.1 kg)    Body mass index is 28.47 kg/m.  Performance status (ECOG): 1 - Symptomatic but completely ambulatory  PHYSICAL EXAM:  Physical Exam Vitals and nursing note reviewed.  Constitutional:      General: He is not in acute distress.    Appearance: Normal appearance. He is normal weight. He is not ill-appearing, toxic-appearing or diaphoretic.  HENT:     Head: Normocephalic and atraumatic.     Right Ear: Tympanic membrane, ear canal and external ear normal. There is no impacted cerumen.     Left Ear: Tympanic membrane, ear canal and external ear normal. There is no impacted cerumen.     Nose: Nose normal. No congestion or rhinorrhea.     Mouth/Throat:     Mouth: Mucous membranes are moist.     Pharynx: Oropharynx is clear. No oropharyngeal  exudate or posterior oropharyngeal erythema.  Eyes:     General: No scleral icterus.       Right eye: No discharge.        Left eye: No discharge.     Extraocular Movements: Extraocular movements intact.     Conjunctiva/sclera: Conjunctivae normal.     Pupils: Pupils are equal, round, and reactive to light.  Cardiovascular:     Rate and Rhythm: Normal rate and regular rhythm.     Pulses: Normal pulses.     Heart sounds: Normal heart sounds. No murmur heard.    No friction rub. No gallop.  Pulmonary:     Effort: Pulmonary effort is normal.     Breath sounds: Normal breath sounds. No wheezing, rhonchi or rales.  Abdominal:     General: Bowel sounds are normal. There is no distension.     Palpations: Abdomen is soft. There is no hepatomegaly, splenomegaly or mass.     Tenderness: There is no abdominal tenderness. There is no left CVA tenderness or rebound.     Hernia: No hernia is present.  Musculoskeletal:        General: No swelling, tenderness, deformity or signs of injury. Normal range of motion.     Cervical back: Normal range of motion and neck supple. No tenderness.     Right lower leg: No edema.     Left lower leg: No edema.  Lymphadenopathy:     Cervical: No cervical adenopathy.     Upper Body:     Right upper body: No supraclavicular or axillary  adenopathy.     Left upper body: No supraclavicular or axillary adenopathy.     Lower Body: No right inguinal adenopathy. No left inguinal adenopathy.  Skin:    General: Skin is warm and dry.     Coloration: Skin is not jaundiced or pale.     Findings: Bruising present. No erythema, lesion or rash.     Comments: Bruises on both hands especially left  Neurological:     General: No focal deficit present.     Mental Status: He is alert and oriented to person, place, and time. Mental status is at baseline.     Cranial Nerves: No cranial nerve deficit.     Sensory: No sensory deficit.     Motor: No weakness.     Coordination:  Coordination normal.     Gait: Gait normal.     Deep Tendon Reflexes: Reflexes normal.  Psychiatric:        Mood and Affect: Mood normal.        Behavior: Behavior normal.        Thought Content: Thought content normal.        Judgment: Judgment normal.     LABS:      Latest Ref Rng & Units 07/06/2023    8:12 AM 06/15/2023   10:56 AM 05/25/2023    8:56 AM  CBC  WBC 4.0 - 10.5 K/uL 7.9  13.9  13.3   Hemoglobin 13.0 - 17.0 g/dL 08.6  57.8  46.9   Hematocrit 39.0 - 52.0 % 37.7  36.0  36.3   Platelets 150 - 400 K/uL 206  218  223       Latest Ref Rng & Units 07/06/2023    8:12 AM 06/15/2023   10:56 AM 05/25/2023    8:56 AM  CMP  Glucose 70 - 99 mg/dL 99  629  528   BUN 8 - 23 mg/dL 17  17  18    Creatinine 0.61 - 1.24 mg/dL 4.13  2.44  0.10   Sodium 135 - 145 mmol/L 140  139  138   Potassium 3.5 - 5.1 mmol/L 4.4  3.9  3.7   Chloride 98 - 111 mmol/L 104  104  101   CO2 22 - 32 mmol/L 24  24  26    Calcium 8.9 - 10.3 mg/dL 9.2  9.5  9.7   Total Protein 6.5 - 8.1 g/dL 6.8  6.6  6.8   Total Bilirubin 0.0 - 1.2 mg/dL 0.4  0.3  0.3   Alkaline Phos 38 - 126 U/L 75  82  75   AST 15 - 41 U/L 30  27  20    ALT 0 - 44 U/L 22  20  15     Component Ref Range & Units (hover) 2 wk ago (06/15/23) 1 mo ago (05/25/23) 2 mo ago (05/04/23) 2 mo ago (04/13/23) 3 mo ago (03/17/23) 4 mo ago (02/23/23) 7 mo ago (11/12/22)  Prostatic Specific Antigen 426.49 High  371.57 High  CM 542.00 High  CM 423.96 High  CM 741.46 High  CM 843.77 High  CM 1,156.86 High  CM   Lab Results  Component Value Date   PSA1 1,362.0 (H) 02/01/2023     Lab Results  Component Value Date   TIBC 398 01/29/2022   FERRITIN 206 01/29/2022   IRONPCTSAT 25 01/29/2022     STUDIES:     HISTORY:   Past Medical History:  Diagnosis Date   Hyperlipidemia    Hypokalemia  03/24/2023   Malignant neoplasm of prostate (HCC)    Supraventricular tachycardia (HCC)     Past Surgical History:  Procedure Laterality Date   CORONARY  STENT INTERVENTION N/A 02/08/2022   Procedure: CORONARY STENT INTERVENTION;  Surgeon: Sammy Crisp, MD;  Location: [facility] INVASIVE CV [facility];  Service: Cardiovascular;  Laterality: N/A;   LEFT HEART CATH AND CORONARY ANGIOGRAPHY N/A 02/08/2022   Procedure: LEFT HEART CATH AND CORONARY ANGIOGRAPHY;  Surgeon: Sammy Crisp, MD;  Location: [facility] INVASIVE CV LAB;  Service: Cardiovascular;  Laterality: N/A;   PROSTATECTOMY  07/2014   TONSILLECTOMY      Family History  Problem Relation Age of Onset   Breast cancer Mother 87   Ovarian cancer Mother 43   Kidney Stones Sister    Breast cancer Paternal Grandmother 59    Social History:  reports that he has never smoked. He has never used smokeless tobacco. He reports that he does not currently use alcohol. He reports that he does not use drugs.The patient is alone today.  Allergies:  Allergies  Allergen Reactions   Atorvastatin    Rosuvastatin Other (See Comments)    Other reaction(s): Muscle pain   Simvastatin Other (See Comments)    Other reaction(s): Joint pain, Muscle pain   Sulfa Antibiotics    Sulfamethoxazole-Trimethoprim Other (See Comments)    Unknown as to interaction was a baby.    Current Medications: Current Outpatient Medications  Medication Sig Dispense Refill   Alirocumab (PRALUENT Falcon) Inject into the skin. One injection every 2 weeks Monday     Cholecalciferol 25 MCG (1000 UT) tablet Take 1,000 Units by mouth in the morning and at bedtime.     clopidogrel  (PLAVIX ) 75 MG tablet Take 75 mg by mouth daily.     diphenhydrAMINE  (BENADRYL ) 25 mg capsule Take 1 capsule by mouth at bedtime.     diphenhydrAMINE  (BENADRYL ) 50 MG capsule Take 50 mg by mouth at bedtime as needed.     ezetimibe  (ZETIA ) 10 MG tablet Take 10 mg by mouth daily.     fluconazole  (DIFLUCAN ) 200 MG tablet Take 1 tablet (200 mg total) by mouth daily. 10 tablet 1   guaiFENesin-codeine 100-10 MG/5ML syrup Take 5-10 mLs by mouth every 4 (four) hours as needed.      isosorbide mononitrate (IMDUR) 30 MG 24 hr tablet Take 1 tablet by mouth every 8 (eight) hours as needed.     leuprolide  (LUPRON ) 22.5 MG injection Inject 22.5 mg into the muscle every 3 (three) months.     metoprolol  tartrate (LOPRESSOR ) 25 MG tablet Take 12.5 mg by mouth 2 (two) times daily.     morphine  (MS CONTIN ) 15 MG 12 hr tablet Take 1 tablet (15 mg total) by mouth 2 (two) times daily. 60 tablet 0   Multiple Vitamins-Minerals (MULTIVITAMIN WITH MINERALS) tablet Take 1 tablet by mouth daily.     nitroGLYCERIN  (NITROSTAT ) 0.4 MG SL tablet Place 0.4 mg under the tongue every 5 (five) minutes as needed for chest pain. Repeat every 5 minutes if needed for a total of 3 tablets in 15 minutes. If no relief, CALL 911.     Omega-3 Fatty Acids (FISH OIL) 1000 MG CAPS Take 2,000 mg by mouth in the morning and at bedtime.     ondansetron  (ZOFRAN ) 8 MG tablet Take 8 mg by mouth every 8 (eight) hours as needed for nausea or vomiting.     polyethylene glycol powder (GLYCOLAX/MIRALAX) 17 GM/SCOOP powder Take 1 Container by mouth once.  potassium chloride  (KLOR-CON  M) 10 MEQ tablet Take 1 tablet (10 mEq total) by mouth daily. 30 tablet 3   predniSONE  (DELTASONE ) 5 MG tablet Take 1 tablet (5 mg total) by mouth 2 (two) times daily with a meal. 60 tablet 5   prochlorperazine  (COMPAZINE ) 10 MG tablet Take 1 tablet (10 mg total) by mouth every 6 (six) hours as needed for nausea or vomiting. 30 tablet 2   Royal Jelly-Bee Pollen-Ginseng (KOREAN GINSENG COMPLEX) 150-250-50 MG CAPS Take 1 capsule by mouth daily.     triamcinolone  (KENALOG ) 0.025 % cream Apply 1 Application topically 2 (two) times daily. 30 g 5   Zoledronic  Acid (ZOMETA ) 4 MG/100ML IVPB Inject 4 mg into the vein every 3 (three) months.     No current facility-administered medications for this visit.   Facility-Administered Medications Ordered in Other Visits  Medication Dose Route Frequency Provider Last Rate Last Admin   sodium chloride  flush  (NS) 0.9 % injection 10 mL  10 mL Intravenous PRN [name], MD   10 mL at 03/17/23 4098   sodium chloride  flush (NS) 0.9 % injection 10 mL  10 mL Intracatheter PRN [name], MD   10 mL at 06/15/23 1234   sodium chloride  flush (NS) 0.9 % injection 10 mL  10 mL Intravenous PRN [name], MD   10 mL at 07/06/23 1191    I,Jasmine M Lassiter,acting as a scribe for [name], MD.,have documented all relevant documentation on the behalf of [name], MD,as directed by  [name], MD while in the presence of [name], MD.

## 2023-07-06 ENCOUNTER — Inpatient Hospital Stay (HOSPITAL_BASED_OUTPATIENT_CLINIC_OR_DEPARTMENT_OTHER): Admitting: Oncology

## 2023-07-06 ENCOUNTER — Encounter: Payer: Self-pay | Admitting: Oncology

## 2023-07-06 ENCOUNTER — Inpatient Hospital Stay

## 2023-07-06 ENCOUNTER — Telehealth: Payer: Self-pay | Admitting: Oncology

## 2023-07-06 VITALS — BP 129/74 | HR 61 | Temp 97.9°F | Resp 18 | Ht 67.0 in | Wt 181.8 lb

## 2023-07-06 DIAGNOSIS — C61 Malignant neoplasm of prostate: Secondary | ICD-10-CM

## 2023-07-06 DIAGNOSIS — C7800 Secondary malignant neoplasm of unspecified lung: Secondary | ICD-10-CM

## 2023-07-06 DIAGNOSIS — C7952 Secondary malignant neoplasm of bone marrow: Secondary | ICD-10-CM | POA: Diagnosis not present

## 2023-07-06 DIAGNOSIS — C7951 Secondary malignant neoplasm of bone: Secondary | ICD-10-CM

## 2023-07-06 LAB — CMP (CANCER CENTER ONLY)
ALT: 22 U/L (ref 0–44)
AST: 30 U/L (ref 15–41)
Albumin: 4.1 g/dL (ref 3.5–5.0)
Alkaline Phosphatase: 75 U/L (ref 38–126)
Anion gap: 12 (ref 5–15)
BUN: 17 mg/dL (ref 8–23)
CO2: 24 mmol/L (ref 22–32)
Calcium: 9.2 mg/dL (ref 8.9–10.3)
Chloride: 104 mmol/L (ref 98–111)
Creatinine: 0.83 mg/dL (ref 0.61–1.24)
GFR, Estimated: >60 mL/min (ref >60)
Glucose, Bld: 99 mg/dL (ref 70–99)
Potassium: 4.4 mmol/L (ref 3.5–5.1)
Sodium: 140 mmol/L (ref 135–145)
Total Bilirubin: 0.4 mg/dL (ref 0.0–1.2)
Total Protein: 6.8 g/dL (ref 6.5–8.1)

## 2023-07-06 LAB — CBC WITH DIFFERENTIAL (CANCER CENTER ONLY)
Abs Immature Granulocytes: 0.02 10*3/uL (ref 0.00–0.07)
Basophils Absolute: 0 10*3/uL (ref 0.0–0.1)
Basophils Relative: 0 %
Eosinophils Absolute: 0.1 10*3/uL (ref 0.0–0.5)
Eosinophils Relative: 1 %
HCT: 37.7 % — ABNORMAL LOW (ref 39.0–52.0)
Hemoglobin: 11.6 g/dL — ABNORMAL LOW (ref 13.0–17.0)
Immature Granulocytes: 0 %
Lymphocytes Relative: 28 %
Lymphs Abs: 2.2 10*3/uL (ref 0.7–4.0)
MCH: 29.5 pg (ref 26.0–34.0)
MCHC: 30.8 g/dL (ref 30.0–36.0)
MCV: 95.9 fL (ref 80.0–100.0)
Monocytes Absolute: 0.5 10*3/uL (ref 0.1–1.0)
Monocytes Relative: 6 %
Neutro Abs: 5.1 10*3/uL (ref 1.7–7.7)
Neutrophils Relative %: 65 %
Platelet Count: 206 10*3/uL (ref 150–400)
RBC: 3.93 MIL/uL — ABNORMAL LOW (ref 4.22–5.81)
RDW: 16.5 % — ABNORMAL HIGH (ref 11.5–15.5)
WBC Count: 7.9 10*3/uL (ref 4.0–10.5)
nRBC: 0 % (ref 0.0–0.2)
nRBC: 0 /100{WBCs}

## 2023-07-06 LAB — PSA: Prostatic Specific Antigen: 438.43 ng/mL — ABNORMAL HIGH (ref 0.00–4.00)

## 2023-07-06 MED ORDER — HEPARIN SOD (PORK) LOCK FLUSH 100 UNIT/ML IV SOLN
500.0000 [IU] | Freq: Once | INTRAVENOUS | Status: AC
  Administered 2023-07-06: 500 [IU] via INTRAVENOUS

## 2023-07-06 MED ORDER — SODIUM CHLORIDE 0.9% FLUSH
10.0000 mL | INTRAVENOUS | Status: AC | PRN
Start: 2023-07-06 — End: ?
  Administered 2023-07-06: 10 mL via INTRAVENOUS

## 2023-07-06 NOTE — Addendum Note (Signed)
 Addended by: Winda Hastings on: 07/06/2023 09:35 AM   Modules accepted: Orders

## 2023-07-06 NOTE — Telephone Encounter (Signed)
 Patient has been scheduled for follow-up visit per 07/05/23 LOS.  Pt aware of scheduled appt details.

## 2023-07-07 ENCOUNTER — Other Ambulatory Visit: Payer: Self-pay

## 2023-07-08 ENCOUNTER — Inpatient Hospital Stay

## 2023-07-11 ENCOUNTER — Ambulatory Visit: Admitting: Nurse Practitioner

## 2023-07-11 NOTE — Progress Notes (Unsigned)
 Cardiology Office Note    Patient Name: Marvin Johnson Date of Encounter: 07/11/2023  Primary Care Provider:  Paniagua, Tiziana, MD Primary Cardiologist:  Lauro Portal, MD Primary Electrophysiologist: None   Past Medical History    Past Medical History:  Diagnosis Date   Hyperlipidemia    Hypokalemia 03/24/2023   Malignant neoplasm of prostate (HCC)    Supraventricular tachycardia (HCC)     History of Present Illness  Marvin Johnson is a 73 y.o. male with a PMH of CAD s/p LHC 2017 with DES/PCI to LAD, RCA and distal left circumflex and subsequent LHC 04/2018 with PCI to distal RCA, NSTEMI 2023 with overlapped DES to OM1 and DES to circumflex, HLD, HTN, prostate CA who presents today for preoperative clearance.  Marvin Johnson is a past medical history of CAD with multiple PCI's and last LHC 2023 with NSTEMI and overlapping DES to OM1 and DES to circumflex.  2D echo showed EF of 55 to 60% with grade 1 DD and trivial MR. Patient had a jailed small OM2 branch that had 90% ostial stenosis but had TIMI-3 flow.  Postprocedure, patient was placed on aspirin  and Plavix  with recommendation to continue dual antiplatelet therapy indefinitely.  He has a history of statin intolerance and is currently on Praluent.  He has a history of statin intolerance and is treated with Praluent and ezetimibe .  He was last seen by Dr. Dean Every on 06/29/2022 and was asymptomatic with controlled blood pressure and no changes made to therapy.  He was diagnosed with stage IVa prostate cancer in 2016 presents today for preoperative clearance for upcoming cystoscopy procedure.    Patient denies chest pain, palpitations, dyspnea, PND, orthopnea, nausea, vomiting, dizziness, syncope, edema, weight gain, or early satiety.   Discussed the use of AI scribe software for clinical note transcription with the patient, who gave verbal consent to proceed.  History of Present Illness   Per Dr. [place], patient may hold  Plavix  for 5 days prior to procedure and resumed as soon as hemodynamically stable post procedure.  ***Notes: -Last ischemic evaluation:  Review of Systems  Please see the history of present illness.    All other systems reviewed and are otherwise negative except as noted above.  Physical Exam    Wt Readings from Last 3 Encounters:  07/06/23 181 lb 12.8 oz (82.5 kg)  06/15/23 178 lb 12.8 oz (81.1 kg)  05/25/23 180 lb 14.4 oz (82.1 kg)   WU:[id number] were no vitals filed for this visit.,There is no height or weight on file to calculate BMI. GEN: Well nourished, well developed in no acute distress Neck: No JVD; No carotid bruits Pulmonary: Clear to auscultation without rales, wheezing or rhonchi  Cardiovascular: Normal rate. Regular rhythm. Normal S1. Normal S2.   Murmurs: There is no murmur.  ABDOMEN: Soft, non-tender, non-distended EXTREMITIES:  No edema; No deformity   EKG/LABS/ Recent Cardiac Studies   ECG personally reviewed by me today - ***  Risk Assessment/Calculations:   {Does this patient have ATRIAL FIBRILLATION?:[phone]}      Lab Results  Component Value Date   WBC 7.9 07/06/2023   HGB 11.6 (L) 07/06/2023   HCT 37.7 (L) 07/06/2023   MCV 95.9 07/06/2023   PLT 206 07/06/2023   Lab Results  Component Value Date   CREATININE 0.83 07/06/2023   BUN 17 07/06/2023   NA 140 07/06/2023   K 4.4 07/06/2023   CL 104 07/06/2023   CO2 24 07/06/2023   Lab Results  Component Value Date   CHOL 178 02/08/2022   HDL 60 02/08/2022   LDLCALC 94 02/08/2022   TRIG 122 02/08/2022   CHOLHDL 3.0 02/08/2022    Lab Results  Component Value Date   HGBA1C 6.5 (H) 02/08/2022   Assessment & Plan    1.  Preoperative clearance: - Patient's RCRI score is 6.6%  2.  Coronary artery disease: -s/p NSTEMI 2023 with severe two-vessel CAD and PCI/DES overlapping in OM1 and circumflex and 30% in-stent restenosis of mid LAD and jailed OM 2 with indefinite DAPT with Plavix  75 mg and ASA  81 mg  3.  HLD  4.  HTN      Disposition: Follow-up with Lauro Portal, MD or APP in *** months {Are you ordering a CV Procedure (e.g. stress test, cath, DCCV, TEE, etc)?   Press F2        :[id number]}   Signed, Francene Ing, Retha Cast, NP 07/11/2023, 3:54 PM [facility]

## 2023-07-13 ENCOUNTER — Ambulatory Visit: Attending: Nurse Practitioner | Admitting: Nurse Practitioner

## 2023-07-13 ENCOUNTER — Encounter: Payer: Self-pay | Admitting: Nurse Practitioner

## 2023-07-13 VITALS — BP 124/70 | HR 58 | Ht 67.0 in | Wt 184.4 lb

## 2023-07-13 DIAGNOSIS — Z01810 Encounter for preprocedural cardiovascular examination: Secondary | ICD-10-CM

## 2023-07-13 DIAGNOSIS — I10 Essential (primary) hypertension: Secondary | ICD-10-CM | POA: Diagnosis not present

## 2023-07-13 DIAGNOSIS — I214 Non-ST elevation (NSTEMI) myocardial infarction: Secondary | ICD-10-CM

## 2023-07-13 DIAGNOSIS — E785 Hyperlipidemia, unspecified: Secondary | ICD-10-CM

## 2023-07-13 DIAGNOSIS — I2510 Atherosclerotic heart disease of native coronary artery without angina pectoris: Secondary | ICD-10-CM

## 2023-07-13 NOTE — Patient Instructions (Addendum)
 Medication Instructions:  HOLD PLAVIX  5 DAYS PRIOR TO PROCEDURE *If you need a refill on your cardiac medications before your next appointment, please call your pharmacy*  Lab Work: None Ordered If you have labs (blood work) drawn today and your tests are completely normal, you will receive your results only by: MyChart Message (if you have MyChart) OR A paper copy in the mail If you have any lab test that is abnormal or we need to change your treatment, we will call you to review the results.  Testing/Procedures: None ordered  Follow-Up: At [facility], you and your health needs are our priority.  As part of our continuing mission to provide you with exceptional heart care, our providers are all part of one team.  This team includes your primary Cardiologist (physician) and Advanced Practice Providers or APPs (Physician Assistants and Nurse Practitioners) who all work together to provide you with the care you need, when you need it.  Your next appointment:   12 month(s)  Provider:   Lauro Portal, MD    We recommend signing up for the patient Johnson called "MyChart".  Sign up information is provided on this After Visit Summary.  MyChart is used to connect with patients for Virtual Visits (Telemedicine).  Patients are able to view lab/test results, encounter notes, upcoming appointments, etc.  Non-urgent messages can be sent to your provider as well.   To learn more about what you can do with MyChart, go to [email or web address].   Other Instructions YOU HAVE BEEN CLEARED FOR YOUR UPCOMING PROCEDURE.

## 2023-07-17 ENCOUNTER — Encounter: Payer: Self-pay | Admitting: Oncology

## 2023-07-19 ENCOUNTER — Other Ambulatory Visit: Payer: Self-pay

## 2023-07-25 ENCOUNTER — Telehealth: Payer: Self-pay | Admitting: Oncology

## 2023-07-25 NOTE — Telephone Encounter (Signed)
 07/25/23 Appt rescheduled from 07/28/23.Patient having surg on 07/28/23.

## 2023-07-26 NOTE — Progress Notes (Signed)
 [facility]  [address] [place],  [place]  [address] [phone]  Clinic Day:  07/27/23   Referring physician: Paniagua, Tiziana, MD  ASSESSMENT & PLAN:  Assessment: Malignant neoplasm of prostate [facility]) Stage IVA prostate cancer diagnosed in January 2016. He was on Xtandi  for years started in June, 2018 when he had increasing tumor at T10 with epidural spread. He stayed on this until May, 2023 when he had severe progression of disease. Due to the progressive disease in the bone and lung in September 2023, he was placed on docetaxel  chemotherapy and continued leuprolide  and zoledronic  acid.  His PSA went down from 536 to 400.75 after 3 cycles, then to 284 with the 5th cycle, and then 203 in March 2024. PSA then went up to 261 in May. We decided to give him Johnson break from chemotherapy at that time.  We continued leuprolide  and zolendronic acid every 3 months. The PSA was 1156. PSMA PET in September 2024 revealed progression of skeletal metastasis, multiple new radiotracer avid lesions within the spine, individual lesions are increased in size compared to prior, accompanied by sclerotic CT change and widespread skeletal metastasis involving the axillary skeleton. No evidence of nodal metastasis or visceral metastasis. He is now receiving palliative chemotherapy with cabazitaxel /prednisone .  He continues leuprolide  and zoledronic  acid every 3 months.  The PSA has fluctuated up and down somewhat, but had decreased to 371.57 in March from 542 in February, then increased to 426.49 as of 06/15/2023. Treatment was on hold briefly and his PSA is pending from today.     Hematuria/Abnormal radiologic findings on diagnostic imaging of renal pelvis, ureter, or bladder He was in the ER at [facility] Monday as he states had suprapubic pressure and then he developed hematuria.  CTA abdomen and CT urogram were done and revealed increased bilateral ureteral mucosal enhancement with periureteral fat  stranding. Mild bilateral hydroureter, left greater than right, with Johnson possible enhancing 3 mm lesion within the mid right ureter. No urinary tract calculi. Further evaluation revealed possible asymmetric right inferolateral bladder wall thickening. He has been evaluated by Dr. Traci Johnson urologist with [facility] and cysto was negative. She performed cystoscopy, bilateral retrograde pyelogram, bilateral ureteroscopy and stent placement last week, and all was clear. Stent removal is scheduled for tomorrow. His treatment has been on hold and we will resume that next week.     Secondary malignant neoplasm of bone and bone marrow (HCC) He continues zoledronic  acid every 3 months after many years of monthly dosing, in addition to his cancer therapy.  He is currently receiving chemotherapy with cabazitaxel /prednisone .  He continues zoledronic  acid and leuprolide  every 3 months.  He received both of those on March 12. He had CT scans done in April, 2025 which revealed stable diffuse bony metastasis.   Anemia He had an upper GI hemorrhage earlier this year and was referred to Dr. Monico Johnson. Fortunately, he just had the one episode and was transfused. His hemoglobin came up to 11.1 and has remained stable. He had Johnson EGD scheduled in April, but it was canceled. His cardiologist Dr. Katheryne Johnson was not comfortable with him stopping clopidogrel  for another year. His bleeding has stopped and so we will not pursue an EGD at this time unless he begins having melena again and/or Johnson significant drop in his hemoglobin.  His hemoglobin is stable. He denies obvious blood loss, but could have chronic mild GI losses. Chemotherapy and bone metastasis can be contributing. We will continue to  monitor this but his hemoglobin has improved since his chemotherapy was held.  Plan:  He had Johnson cystoscopy at [facility] with Dr. Paddy Johnson with left ureteroscopy with stent placement and right ureteroscopy and bilateral retrograde pyelogram with  radiographic interpretation done on 07/21/2023. Normal right retrograde pyelogram, mild narrowing of left distal and proximal ureter on retrograde pyelogram, and no abnormalities on the bilateral ureteroscopy were found. He also had Johnson left ureteral stent put in place due to distal left edematous ureter and will have this removed tomorrow. Everything was clear. [facility] is not sure [facility]. I have been caring for him for over 9 years and he has Johnson very complicated case requiring chemotherapy for extensive bone metastases and lung lesions. He is stable and we will be resuming his therapy next week but I think it would be Johnson hardship for him to travel over 40 miles for treatment through the [place]. I will give him Johnson letter to document his complicated case and would welcome Johnson phone call if they have any questions. He is eager to resume the Marvin Johnson  and I explained with his stent in place it would be best to wait until next week. He has Johnson WBC of 8.8, low hemoglobin of 11.7 up from 11.6, and 270,000. His CMP is normal other than Johnson low potassium of 3.3. I instructed him to increase his oral potassium from once daily to BID. His PSA today is pending and I will call him with the results. His PSA continues to increase as it has went from 371.57, 426.49, and most recent was only slightly higher at 438.43. He will return in 1 week for his infusion and I will see him back in 3 weeks later after with CBC, CMP, and PSA for his next dose of Marvin Johnson . The patient was given Johnson copy of today's report. The patient understands the plans discussed today and is in agreement with them.  He knows to contact our office if he develops concerns prior to his next appointment.  I provided 23 minutes of face-to-face time during this encounter and > 50% was spent counseling as documented under my assessment and plan.   Marvin Baumgartner, MD  [facility] Marvin Johnson  [facility] [address] [place] [address] Dept: [phone] Dept Fax: [phone]   No orders of the defined types were placed in this encounter.   CHIEF COMPLAINT:  CC: Metastatic prostate cancer  Current Treatment: Cabazitaxel /prednisone /leuprolide /zoledronic  acid  HISTORY OF PRESENT ILLNESS:  Marvin Johnson is Johnson 73 y.o.  male with Johnson history of stage IV (T3b N1 M0) prostate cancer diagnosed in January 2016 when he was found to have Johnson PSA of 51.8.  He was treated with Johnson laparoscopic prostatectomy in May 2016.  Pathology revealed adenocarcinoma with Johnson Gleason 8.  There was Johnson positive posterior margin with extraprostatic extension, as well as left retro-urethral tissue positive and extension to the seminal vesicles bilaterally with multiple positive margins.  Two lymph nodes from the right side were negative, but 2/4 lymph nodes from the left side were positive for metastasis. Repeat bone scan in June 2016 revealed Johnson new lesion at T10, which was not present on his baseline bone scan.  PSA at Dr. Donavan Fuchs office in early August 2016 remained elevated at 56.4.   We began seeing him in August 2016. The PSA  was up to 216.3 in late August 2016, so hormonal therapy was recommended  X-rays of the thoracic spine revealed osteoarthritis and scoliosis, as well as evidence of demineralization, but we could not visualize Johnson metastatic lesion.Aaron Aas He started leuprolide  in September 2016.  The PSA initially dropped steadily with leuprolide .  Bone density scan in November 2016 revealed osteopenia, with Johnson T-score of -2.1 in the spine and Johnson T-score of -0.9 in the femur.  He had been taking vitamin-D 1000 international units daily, but not calcium, so calcium 600 mg twice daily was added at that time.    Leuprolide  was held in February 2017 because of chest pain.  EKG was unremarkable.  He went to the [place] and he was referred to the [facility] for placement of 2 coronary artery stents  for 95% and 99% occlusions.  He then had Johnson third coronary artery stent placed later in the summer.  Leuprolide  was resumed in March 2017.  In September 2017, he had an increase in the PSA, so we repeated Johnson bone scan.  This revealed intense uptake within the right posterior elements of T10, but no other areas of metastasis.  We then added bicalutamide 50 mg daily to his leuprolide .     He had Johnson steadily increasing PSA despite the bicalutamide, so this was discontinued in March 2018.  He eventually was placed on MS Contin  15 mg twice daily, with oxycodone  10 mg every 4 hours as needed for breakthrough pain.  MRI thoracic spine in April 2018 revealed increasing tumor at T10 with epidural spread.  Bone scan at that time revealed increased activity in the T10 lesion, which was stable.  There was Johnson questionable tiny focus in the right ischium/acetabular rim.  Plain x-rays of the bilateral hips and pelvis did not reveal any focal abnormality. The PSA increased from 27.5 in March, to 72.2 in May, then to 92.2 in June 2018.  He was referred for radiotherapy to the T10 lesion due to the severe pain with improvement in his pain. He was started on zoledronic  acid along with his leuprolide  in June 2018.  He had Johnson Port-Johnson-Cath placed in anticipation of chemotherapy, but then we recommended that he be placed on enzalutamide  160 mg daily instead, in order to delay the time to initiation of chemotherapy.  He started enzalutamide  in June 2018.  The PSA became undetectable in October 2018.  We continued MS Contin , but discontinued the oxycodone . He did not tolerate meloxicam, so uses Tylenol  as needed for breakthrough pain.     He had Johnson stress test in January 2020 and was referred to [facility] for coronary artery stenting, which was done in February.  Zoledronic  acid had been given monthly for over 2 years, so was changed to every 3 months in October 2020.   The PSA started to increase in January 2021 at which  time it was 0.2.  CT chest, abdomen and pelvis in March 2021 not reveal any lymphadenopathy or soft tissue metastatic disease.  Hepatic steatosis was seen.  There was Johnson stable sclerotic osseous lesion of the T10 vertebral body, as well as Johnson subtle sclerotic lesion of the L3 vertebral body, also possibly Johnson small metastatic lesion.  Whole body bone scan did not reveal continued increased activity of T10 and right acetabulum posteriorly.  The PSA was 0.5 in March.  PSA had increased to 3.0 in June.  Axumin  PET imaging from July did not reveal any evidence of active  prostate carcinoma, local recurrence, or metastatic disease. The PSA continued to slowly rise and was up to 5.9 in September, 10.9 in October, and 16.3 in December.     The PSA went up to 23.81 in March 2022, so an Axumin  PET scan was ordered which revealed an oligometastatic skeletal lesion in the anterior right 7th rib with very subtle sclerotic change at this level. PSA in June decreased to 18.6, but then in September increased to 58.  In view of the significant rise in his PSA, Johnson prostate specific PET scan was ordered.  PSMA PET in October 2022 revealed Johnson persistent focus of intense radiotracer activity in the anterior RIGHT seventh rib with increased in activity from comparison exam and subtle sclerotic changes on CT. Findings remain consistent with oligometastatic skeletal metastasis. Sclerotic lesions in the T10 vertebral body without radiotracer activity potentially represented prior treated prostate cancer metastasis.  We recommended he continue his current treatment with enzalutamide , leuprolide  and zoledronic  acid.  PSA in November continued to increase to 83.49.  Even though his PSA continued to steadily increase, we did not recommend Johnson change in therapy based on this alone.  He had no severe pain and no increase in pain and was having Johnson good quality of life.     The PSA has continued to fluctuate up and down and was 31 in February and 78 in  May of 2023. It then increased dramatically to 334 in August and he was scheduled for Johnson PSMA PET scan.  This revealed severe progression of disease with several tiny lung nodules which did have hypermetabolic activity, with SUV up to 5.  He had increased bone lesions which are now innumerable, including major lesions at L3, the right iliac wing, and the left pubic bone. These were not visible on CT scan. He stopped the Enzalutamide  and started the Taxotere  chemotherapy in September 2023 and is tolerating this well. His PSA went down from 536 to 400.75 after 3 cycles and went down to 284 with the 5th cycle. It was down to 203 by March of 2024. He then had Johnson gastrointestinal hemorrhage with melena and drop of his hemoglobin from 12 to 7.0 on March 11th. He was hospitalized for transfusion but EGD was not done due to needing to stay on Plavix  after his most recent MI.  Oncology History  Malignant neoplasm of prostate (HCC)  03/30/2014 Cancer Staging   Staging form: Prostate, AJCC 8th Edition - Clinical stage from 03/30/2014: Stage IVA (cT3b, cN1, cM0, PSA: 51.8, Grade Group: 4) - Signed by Marvin Baumgartner, MD on 11/14/2020 Histopathologic type: Adenocarcinoma, NOS Stage prefix: Initial diagnosis Prostate specific antigen (PSA) range: 20 or greater Gleason primary pattern: 4 Gleason secondary pattern: 4 Gleason score: 8 Histologic grading system: 5 grade system Location of positive needle core biopsies: Beyond primary site Prognostic indicators: May 2016 Robotic prostatectomy with mult. Pos margins, extra prostatic extension, 2/6 pos nodes. Scan June 2016 positive at T10  PSA up to 216.3 by August Placed on lupron  and casodex Stage used in treatment planning: Yes National guidelines used in treatment planning: Yes Type of national guideline used in treatment planning: NCCN   02/18/2020 Initial Diagnosis   Malignant neoplasm of prostate (HCC)   12/01/2021 - 07/19/2022 Chemotherapy   Patient is on  Treatment Plan : PROSTATE Docetaxel  (75) + Prednisone  q21d     12/21/2022 -  Chemotherapy   Patient is on Treatment Plan : PROSTATE Cabazitaxel  (20) D1 + Prednisone  D1-21 q21d  Secondary malignant neoplasm of bone and bone marrow (HCC)  02/18/2020 Initial Diagnosis   Secondary malignant neoplasm of bone and bone marrow (HCC)   12/01/2021 - 07/19/2022 Chemotherapy   Patient is on Treatment Plan : PROSTATE Docetaxel  (75) + Prednisone  q21d     12/21/2022 -  Chemotherapy   Patient is on Treatment Plan : PROSTATE Cabazitaxel  (20) D1 + Prednisone  D1-21 q21d     Malignant neoplasm of prostate metastatic to lung (HCC)  11/25/2021 Initial Diagnosis   Malignant neoplasm of prostate metastatic to lung (HCC)   12/21/2022 -  Chemotherapy   Patient is on Treatment Plan : PROSTATE Cabazitaxel  (20) D1 + Prednisone  D1-21 q21d       INTERVAL HISTORY:  Marvin Johnson is here today for repeat clinical assessment for his stage IV prostate cancer. Patient states that he feels well but complains of Johnson dull aching back pain due to Johnson stent that was recently placed for narrowing of the right ureter. Imaging suggested Johnson possible abnormality there. He had Johnson cystoscopy at [facility] with Dr. Paddy Johnson with left ureteroscopy with stent placement and right ureteroscopy and bilateral retrograde pyelogram with radiographic interpretation done on 07/21/2023. Normal right retrograde pyelogram, mild narrowing of left distal and proximal ureter on retrograde pyelogram, and no abnormalities on the bilateral ureteroscopy were found. He also had Johnson left ureteral stent put in place due to distal left edematous ureter and will have this removed tomorrow. Everything was clear. [facility] is not sure [facility]. I have been caring for him for over 9 years and he has Johnson very complicated case requiring chemotherapy for extensive bone metastases and lung lesions. He is stable and we will be resuming his  therapy next week but I think it would be Johnson hardship for him to travel over 40 miles for treatment through the [place]. I will give him Johnson letter to document his complicated case and would welcome Johnson phone call if they have any questions. He is eager to resume the Marvin Johnson  and I explained with his stent in place it would be best to wait until next week. He has Johnson WBC of 8.8, low hemoglobin of 11.7 up from 11.6, and 270,000. His CMP is normal other than Johnson low potassium of 3.3. I instructed him to increase his oral potassium from once daily to BID. His PSA today is pending and I will call him with the results. His PSA continues to increase as it has went from 371.57, 426.49, and most recent was only slightly higher at 438.43. He will return in 1 week for his infusion and I will see him back in 3 weeks later after with CBC, CMP, and PSA for his next dose of Marvin Johnson . The patient was given Johnson copy of today's report.  He denies fever, chills, night sweats, or other signs of infection. He denies cardiorespiratory and gastrointestinal issues. His appetite is good and her weight has been stable.   REVIEW OF SYSTEMS:  Review of Systems  Constitutional:  Negative for appetite change, chills, diaphoresis, fatigue, fever and unexpected weight change.  HENT:  Negative.  Negative for lump/mass, mouth sores and sore throat.   Eyes: Negative.   Respiratory: Negative.  Negative for chest tightness, cough, hemoptysis, shortness of breath and wheezing.   Cardiovascular: Negative.  Negative for chest pain, leg swelling and palpitations.  Gastrointestinal:  Negative for abdominal distention, abdominal pain, blood in stool, constipation, diarrhea, nausea  and vomiting.  Endocrine: Negative.  Negative for hot flashes.  Genitourinary:  Positive for dysuria and hematuria. Negative for bladder incontinence, difficulty urinating and frequency.   Musculoskeletal:  Positive for back pain (dull aching, lower). Negative for arthralgias, flank  pain, gait problem and myalgias.  Skin: Negative.  Negative for itching, rash and wound.  Neurological: Negative.  Negative for dizziness, extremity weakness, gait problem, headaches, light-headedness, numbness, seizures and speech difficulty.  Hematological: Negative.  Negative for adenopathy. Does not bruise/bleed easily.  Psychiatric/Behavioral: Negative.  Negative for depression and sleep disturbance. The patient is not nervous/anxious.      VITALS:  Blood pressure 121/68, pulse 78, temperature 98.1 F (36.7 C), temperature source Oral, resp. rate 18, height 5\' 7"  (1.702 m), weight 181 lb 14.4 oz (82.5 kg), SpO2 98%.  Wt Readings from Last 3 Encounters:  07/27/23 181 lb 14.4 oz (82.5 kg)  07/13/23 184 lb 6.4 oz (83.6 kg)  07/06/23 181 lb 12.8 oz (82.5 kg)    Body mass index is 28.49 kg/m.  Performance status (ECOG): 1 - Symptomatic but completely ambulatory  PHYSICAL EXAM:  Physical Exam Vitals and nursing note reviewed.  Constitutional:      General: He is not in acute distress.    Appearance: Normal appearance. He is normal weight. He is not ill-appearing, toxic-appearing or diaphoretic.  HENT:     Head: Normocephalic and atraumatic.     Right Ear: Tympanic membrane, ear canal and external ear normal. There is no impacted cerumen.     Left Ear: Tympanic membrane, ear canal and external ear normal. There is no impacted cerumen.     Nose: Nose normal. No congestion or rhinorrhea.     Mouth/Throat:     Mouth: Mucous membranes are moist.     Pharynx: Oropharynx is clear. No oropharyngeal exudate or posterior oropharyngeal erythema.  Eyes:     General: No scleral icterus.       Right eye: No discharge.        Left eye: No discharge.     Extraocular Movements: Extraocular movements intact.     Conjunctiva/sclera: Conjunctivae normal.     Pupils: Pupils are equal, round, and reactive to light.  Cardiovascular:     Rate and Rhythm: Normal rate and regular rhythm.     Pulses:  Normal pulses.     Heart sounds: Normal heart sounds. No murmur heard.    No friction rub. No gallop.  Pulmonary:     Effort: Pulmonary effort is normal.     Breath sounds: Normal breath sounds. No wheezing, rhonchi or rales.  Abdominal:     General: Bowel sounds are normal. There is no distension.     Palpations: Abdomen is soft. There is no hepatomegaly, splenomegaly or mass.     Tenderness: There is no abdominal tenderness. There is no left CVA tenderness or rebound.     Hernia: No hernia is present.  Musculoskeletal:        General: No swelling, tenderness, deformity or signs of injury. Normal range of motion.     Cervical back: Normal range of motion and neck supple. No tenderness.     Right lower leg: No edema.     Left lower leg: No edema.  Lymphadenopathy:     Cervical: No cervical adenopathy.     Upper Body:     Right upper body: No supraclavicular or axillary adenopathy.     Left upper body: No supraclavicular or axillary adenopathy.  Lower Body: No right inguinal adenopathy. No left inguinal adenopathy.  Skin:    General: Skin is warm and dry.     Coloration: Skin is not jaundiced or pale.     Findings: No bruising, erythema, lesion or rash.  Neurological:     General: No focal deficit present.     Mental Status: He is alert and oriented to person, place, and time. Mental status is at baseline.     Cranial Nerves: No cranial nerve deficit.     Sensory: No sensory deficit.     Motor: No weakness.     Coordination: Coordination normal.     Gait: Gait normal.     Deep Tendon Reflexes: Reflexes normal.  Psychiatric:        Mood and Affect: Mood normal.        Behavior: Behavior normal.        Thought Content: Thought content normal.        Judgment: Judgment normal.    LABS:      Latest Ref Rng & Units 07/27/2023   10:00 AM 07/06/2023    8:12 AM 06/15/2023   10:56 AM  CBC  WBC 4.0 - 10.5 K/uL 8.8  7.9  13.9   Hemoglobin 13.0 - 17.0 g/dL 40.9  81.1  91.4    Hematocrit 39.0 - 52.0 % 37.4  37.7  36.0   Platelets 150 - 400 K/uL 270  206  218       Latest Ref Rng & Units 07/27/2023   10:00 AM 07/06/2023    8:12 AM 06/15/2023   10:56 AM  CMP  Glucose 70 - 99 mg/dL 782  99  956   BUN 8 - 23 mg/dL 14  17  17    Creatinine 0.61 - 1.24 mg/dL 2.13  0.86  5.78   Sodium 135 - 145 mmol/L 138  140  139   Potassium 3.5 - 5.1 mmol/L 3.3  4.4  3.9   Chloride 98 - 111 mmol/L 101  104  104   CO2 22 - 32 mmol/L 23  24  24    Calcium 8.9 - 10.3 mg/dL 9.8  9.2  9.5   Total Protein 6.5 - 8.1 g/dL 6.8  6.8  6.6   Total Bilirubin 0.0 - 1.2 mg/dL 0.4  0.4  0.3   Alkaline Phos 38 - 126 U/L 95  75  82   AST 15 - 41 U/L 26  30  27    ALT 0 - 44 U/L 13  22  20     Component Ref Range & Units (hover) 2 wk ago (07/06/23) 1 mo ago (06/15/23) 2 mo ago (05/25/23) 2 mo ago (05/04/23) 3 mo ago (04/13/23) 4 mo ago (03/17/23) 5 mo ago (02/23/23)  Prostatic Specific Antigen 438.43 High  426.49 High  CM 371.57 High  CM 542.00 High  CM 423.96 High  CM 741.46 High  CM 843.77 High  CM   Lab Results  Component Value Date   PSA1 1,362.0 (H) 02/01/2023    Lab Results  Component Value Date   TIBC 398 01/29/2022   FERRITIN 206 01/29/2022   IRONPCTSAT 25 01/29/2022  No results found for: "TSH", "T3TOTAL", "T4TOTAL", "THYROIDAB"  STUDIES:     HISTORY:   Past Medical History:  Diagnosis Date   Hyperlipidemia    Hypokalemia 03/24/2023   Malignant neoplasm of prostate (HCC)    Supraventricular tachycardia (HCC)     Past Surgical History:  Procedure Laterality Date  CORONARY STENT INTERVENTION N/Johnson 02/08/2022   Procedure: CORONARY STENT INTERVENTION;  Surgeon: Sammy Crisp, MD;  Location: [facility];  Service: Cardiovascular;  Laterality: N/Johnson;   LEFT HEART CATH AND CORONARY ANGIOGRAPHY N/Johnson 02/08/2022   Procedure: LEFT HEART CATH AND CORONARY ANGIOGRAPHY;  Surgeon: Sammy Crisp, MD;  Location: [facility];  Service: Cardiovascular;  Laterality: N/Johnson;    PROSTATECTOMY  07/2014   TONSILLECTOMY      Family History  Problem Relation Age of Onset   Breast cancer Mother 28   Ovarian cancer Mother 93   Kidney Stones Sister    Breast cancer Paternal Grandmother 67    Social History:  reports that he has never smoked. He has never used smokeless tobacco. He reports that he does not currently use alcohol. He reports that he does not use drugs.The patient is alone today.  Allergies:  Allergies  Allergen Reactions   Atorvastatin    Rosuvastatin Other (See Comments)    Other reaction(s): Muscle pain   Simvastatin Other (See Comments)    Other reaction(s): Joint pain, Muscle pain   Sulfa Antibiotics    Sulfamethoxazole-Trimethoprim Other (See Comments)    Unknown as to interaction was Johnson baby.    Current Medications: Current Outpatient Medications  Medication Sig Dispense Refill   Alirocumab 75 MG/ML SOAJ Inject 75 mg into the skin.     oxybutynin (DITROPAN) 5 MG tablet Take 5 mg by mouth.     Cholecalciferol 25 MCG (1000 UT) tablet Take 1,000 Units by mouth in the morning and at bedtime.     clopidogrel  (PLAVIX ) 75 MG tablet Take 75 mg by mouth daily.     diphenhydrAMINE  (BENADRYL ) 25 mg capsule Take 1 capsule by mouth at bedtime.     diphenhydrAMINE  (BENADRYL ) 50 MG capsule Take 50 mg by mouth at bedtime as needed.     ezetimibe  (ZETIA ) 10 MG tablet Take 10 mg by mouth daily.     fluconazole  (DIFLUCAN ) 200 MG tablet Take 1 tablet (200 mg total) by mouth daily. 10 tablet 1   guaiFENesin-codeine 100-10 MG/5ML syrup Take 5-10 mLs by mouth every 4 (four) hours as needed.     isosorbide mononitrate (IMDUR) 30 MG 24 hr tablet Take 1 tablet by mouth every 8 (eight) hours as needed.     leuprolide  (LUPRON ) 22.5 MG injection Inject 22.5 mg into the muscle every 3 (three) months.     metoprolol  tartrate (LOPRESSOR ) 25 MG tablet Take 12.5 mg by mouth 2 (two) times daily.     morphine  (MS CONTIN ) 15 MG 12 hr tablet Take 1 tablet (15 mg total) by  mouth 2 (two) times daily. 60 tablet 0   Multiple Vitamins-Minerals (MULTIVITAMIN WITH MINERALS) tablet Take 1 tablet by mouth daily.     nitroGLYCERIN  (NITROSTAT ) 0.4 MG Johnson tablet Place 0.4 mg under the tongue every 5 (five) minutes as needed for chest pain. Repeat every 5 minutes if needed for Johnson total of 3 tablets in 15 minutes. If no relief, CALL 911.     Omega-3 Fatty Acids (FISH OIL) 1000 MG CAPS Take 2,000 mg by mouth in the morning and at bedtime.     ondansetron  (ZOFRAN ) 8 MG tablet Take 8 mg by mouth every 8 (eight) hours as needed for nausea or vomiting.     polyethylene glycol powder (GLYCOLAX/MIRALAX) 17 GM/SCOOP powder Take 1 Container by mouth once.     potassium chloride  (KLOR-CON  M) 10 MEQ tablet Take 1 tablet (10 mEq  total) by mouth daily. 30 tablet 3   predniSONE  (DELTASONE ) 5 MG tablet Take 1 tablet (5 mg total) by mouth 2 (two) times daily with Johnson meal. 60 tablet 5   prochlorperazine  (COMPAZINE ) 10 MG tablet Take 1 tablet (10 mg total) by mouth every 6 (six) hours as needed for nausea or vomiting. 30 tablet 2   Royal Jelly-Bee Pollen-Ginseng (KOREAN GINSENG COMPLEX) 150-250-50 MG CAPS Take 1 capsule by mouth daily.     triamcinolone  (KENALOG ) 0.025 % cream Apply 1 Application topically 2 (two) times daily. 30 g 5   Zoledronic  Acid (ZOMETA ) 4 MG/100ML IVPB Inject 4 mg into the vein every 3 (three) months.     No current facility-administered medications for this visit.   Facility-Administered Medications Ordered in Other Visits  Medication Dose Route Frequency Provider Last Rate Last Admin   sodium chloride  flush (NS) 0.9 % injection 10 mL  10 mL Intravenous PRN Marvin Baumgartner, MD   10 mL at 03/17/23 0953   sodium chloride  flush (NS) 0.9 % injection 10 mL  10 mL Intracatheter PRN Marvin Baumgartner, MD   10 mL at 06/15/23 1234   sodium chloride  flush (NS) 0.9 % injection 10 mL  10 mL Intravenous PRN Marvin Baumgartner, MD   10 mL at 07/06/23 9604   sodium chloride   flush (NS) 0.9 % injection 10 mL  10 mL Intravenous PRN Marvin Baumgartner, MD   10 mL at 07/27/23 1114    I,Jasmine M Lassiter,acting as Johnson scribe for Marvin Baumgartner, MD.,have documented all relevant documentation on the behalf of Marvin Baumgartner, MD,as directed by  Marvin Baumgartner, MD while in the presence of Marvin Baumgartner, MD.

## 2023-07-27 ENCOUNTER — Ambulatory Visit: Admitting: Hematology and Oncology

## 2023-07-27 ENCOUNTER — Inpatient Hospital Stay (HOSPITAL_BASED_OUTPATIENT_CLINIC_OR_DEPARTMENT_OTHER): Admitting: Oncology

## 2023-07-27 ENCOUNTER — Telehealth: Payer: Self-pay | Admitting: Oncology

## 2023-07-27 ENCOUNTER — Encounter: Payer: Self-pay | Admitting: Oncology

## 2023-07-27 ENCOUNTER — Ambulatory Visit

## 2023-07-27 ENCOUNTER — Other Ambulatory Visit

## 2023-07-27 ENCOUNTER — Other Ambulatory Visit: Payer: Self-pay | Admitting: Oncology

## 2023-07-27 ENCOUNTER — Inpatient Hospital Stay: Attending: Oncology

## 2023-07-27 VITALS — BP 121/68 | HR 78 | Temp 98.1°F | Resp 18 | Ht 67.0 in | Wt 181.9 lb

## 2023-07-27 DIAGNOSIS — D649 Anemia, unspecified: Secondary | ICD-10-CM | POA: Insufficient documentation

## 2023-07-27 DIAGNOSIS — C61 Malignant neoplasm of prostate: Secondary | ICD-10-CM

## 2023-07-27 DIAGNOSIS — Z5111 Encounter for antineoplastic chemotherapy: Secondary | ICD-10-CM | POA: Diagnosis present

## 2023-07-27 DIAGNOSIS — C7800 Secondary malignant neoplasm of unspecified lung: Secondary | ICD-10-CM

## 2023-07-27 DIAGNOSIS — C7952 Secondary malignant neoplasm of bone marrow: Secondary | ICD-10-CM

## 2023-07-27 DIAGNOSIS — C7951 Secondary malignant neoplasm of bone: Secondary | ICD-10-CM | POA: Diagnosis not present

## 2023-07-27 LAB — CBC WITH DIFFERENTIAL (CANCER CENTER ONLY)
Abs Immature Granulocytes: 0.03 10*3/uL (ref 0.00–0.07)
Basophils Absolute: 0 10*3/uL (ref 0.0–0.1)
Basophils Relative: 0 %
Eosinophils Absolute: 0.1 10*3/uL (ref 0.0–0.5)
Eosinophils Relative: 1 %
HCT: 37.4 % — ABNORMAL LOW (ref 39.0–52.0)
Hemoglobin: 11.7 g/dL — ABNORMAL LOW (ref 13.0–17.0)
Immature Granulocytes: 0 %
Lymphocytes Relative: 31 %
Lymphs Abs: 2.7 10*3/uL (ref 0.7–4.0)
MCH: 29.2 pg (ref 26.0–34.0)
MCHC: 31.3 g/dL (ref 30.0–36.0)
MCV: 93.3 fL (ref 80.0–100.0)
Monocytes Absolute: 0.5 10*3/uL (ref 0.1–1.0)
Monocytes Relative: 5 %
Neutro Abs: 5.4 10*3/uL (ref 1.7–7.7)
Neutrophils Relative %: 63 %
Platelet Count: 270 10*3/uL (ref 150–400)
RBC: 4.01 MIL/uL — ABNORMAL LOW (ref 4.22–5.81)
RDW: 14 % (ref 11.5–15.5)
WBC Count: 8.8 10*3/uL (ref 4.0–10.5)
nRBC: 0 % (ref 0.0–0.2)

## 2023-07-27 LAB — CMP (CANCER CENTER ONLY)
ALT: 13 U/L (ref 0–44)
AST: 26 U/L (ref 15–41)
Albumin: 3.9 g/dL (ref 3.5–5.0)
Alkaline Phosphatase: 95 U/L (ref 38–126)
Anion gap: 15 (ref 5–15)
BUN: 14 mg/dL (ref 8–23)
CO2: 23 mmol/L (ref 22–32)
Calcium: 9.8 mg/dL (ref 8.9–10.3)
Chloride: 101 mmol/L (ref 98–111)
Creatinine: 0.95 mg/dL (ref 0.61–1.24)
GFR, Estimated: >60 mL/min (ref >60)
Glucose, Bld: 175 mg/dL — ABNORMAL HIGH (ref 70–99)
Potassium: 3.3 mmol/L — ABNORMAL LOW (ref 3.5–5.1)
Sodium: 138 mmol/L (ref 135–145)
Total Bilirubin: 0.4 mg/dL (ref 0.0–1.2)
Total Protein: 6.8 g/dL (ref 6.5–8.1)

## 2023-07-27 LAB — PSA: Prostatic Specific Antigen: 822.82 ng/mL — ABNORMAL HIGH (ref 0.00–4.00)

## 2023-07-27 MED ORDER — HEPARIN SOD (PORK) LOCK FLUSH 100 UNIT/ML IV SOLN
500.0000 [IU] | Freq: Once | INTRAVENOUS | Status: AC
  Administered 2023-07-27: 500 [IU] via INTRAVENOUS

## 2023-07-27 MED ORDER — SODIUM CHLORIDE 0.9% FLUSH
10.0000 mL | INTRAVENOUS | Status: AC | PRN
  Administered 2023-07-27: 10 mL via INTRAVENOUS

## 2023-07-27 NOTE — Addendum Note (Signed)
 Addended by: Reesa Cannon E on: 07/27/2023 11:15 AM   Modules accepted: Orders

## 2023-07-27 NOTE — Telephone Encounter (Signed)
 Patient has been scheduled for follow-up visit per 07/25/23 LOS.  Pt given an appt calendar with date and time.

## 2023-07-28 ENCOUNTER — Inpatient Hospital Stay: Admitting: Oncology

## 2023-07-28 ENCOUNTER — Inpatient Hospital Stay

## 2023-07-28 ENCOUNTER — Other Ambulatory Visit: Payer: Self-pay

## 2023-07-30 ENCOUNTER — Other Ambulatory Visit: Payer: Self-pay

## 2023-08-01 ENCOUNTER — Other Ambulatory Visit: Payer: Self-pay

## 2023-08-01 DIAGNOSIS — M898X9 Other specified disorders of bone, unspecified site: Secondary | ICD-10-CM

## 2023-08-01 DIAGNOSIS — C7951 Secondary malignant neoplasm of bone: Secondary | ICD-10-CM

## 2023-08-01 DIAGNOSIS — R21 Rash and other nonspecific skin eruption: Secondary | ICD-10-CM

## 2023-08-01 DIAGNOSIS — C61 Malignant neoplasm of prostate: Secondary | ICD-10-CM

## 2023-08-01 MED ORDER — TRIAMCINOLONE ACETONIDE 0.025 % EX CREA: 1.0000 | TOPICAL_CREAM | Freq: Two times a day (BID) | CUTANEOUS | 5 refills | Status: DC

## 2023-08-01 MED ORDER — MORPHINE SULFATE ER 15 MG PO TBCR: 15.0000 mg | EXTENDED_RELEASE_TABLET | Freq: Two times a day (BID) | ORAL | 0 refills | Status: DC

## 2023-08-03 ENCOUNTER — Inpatient Hospital Stay

## 2023-08-03 ENCOUNTER — Telehealth: Payer: Self-pay

## 2023-08-03 VITALS — BP 135/61 | HR 59 | Temp 97.5°F | Resp 18 | Wt 180.0 lb

## 2023-08-03 DIAGNOSIS — C7951 Secondary malignant neoplasm of bone: Secondary | ICD-10-CM | POA: Diagnosis not present

## 2023-08-03 DIAGNOSIS — Z5111 Encounter for antineoplastic chemotherapy: Secondary | ICD-10-CM | POA: Diagnosis present

## 2023-08-03 DIAGNOSIS — D649 Anemia, unspecified: Secondary | ICD-10-CM | POA: Diagnosis not present

## 2023-08-03 DIAGNOSIS — C61 Malignant neoplasm of prostate: Secondary | ICD-10-CM

## 2023-08-03 MED ORDER — HEPARIN SOD (PORK) LOCK FLUSH 100 UNIT/ML IV SOLN: 500.0000 [IU] | Freq: Once | INTRAVENOUS | Status: DC | PRN

## 2023-08-03 MED ORDER — FAMOTIDINE IN NACL 20-0.9 MG/50ML-% IV SOLN
20.0000 mg | Freq: Once | INTRAVENOUS | Status: AC
  Administered 2023-08-03: 20 mg via INTRAVENOUS
  Filled 2023-08-03: qty 50

## 2023-08-03 MED ORDER — DEXAMETHASONE SODIUM PHOSPHATE 10 MG/ML IJ SOLN
10.0000 mg | Freq: Once | INTRAMUSCULAR | Status: AC
  Administered 2023-08-03: 10 mg via INTRAVENOUS
  Filled 2023-08-03: qty 1

## 2023-08-03 MED ORDER — SODIUM CHLORIDE 0.9 % IV SOLN: Freq: Once | INTRAVENOUS | Status: AC

## 2023-08-03 MED ORDER — SODIUM CHLORIDE 0.9% FLUSH: 10.0000 mL | INTRAVENOUS | Status: DC | PRN

## 2023-08-03 MED ORDER — SODIUM CHLORIDE 0.9 % IV SOLN
20.0000 mg/m2 | Freq: Once | INTRAVENOUS | Status: AC
  Administered 2023-08-03: 41 mg via INTRAVENOUS
  Filled 2023-08-03: qty 4.1

## 2023-08-03 MED ORDER — DIPHENHYDRAMINE HCL 50 MG/ML IJ SOLN
25.0000 mg | Freq: Once | INTRAMUSCULAR | Status: AC
Start: 2023-08-03 — End: 2023-08-03
  Administered 2023-08-03: 25 mg via INTRAVENOUS
  Filled 2023-08-03: qty 1

## 2023-08-03 NOTE — Patient Instructions (Signed)
 Cabazitaxel Injection What is this medication? CABAZITAXEL (ka BAZ i TAX el) treats prostate cancer. It works by slowing down the growth of cancer cells. This medicine may be used for other purposes; ask your health care provider or pharmacist if you have questions. COMMON BRAND NAME(S): Jevtana What should I tell my care team before I take this medication? They need to know if you have any of these conditions: Kidney problems Liver disease Low white blood cell levels Lung disease Stomach or intestine problems An unusual or allergic reaction to cabazitaxel, polysorbate 80, other medications, foods, dyes, or preservatives Pregnant or trying to get pregnant Breast-feeding How should I use this medication? This medication is injected into a vein. It is given by your care team in a hospital or clinic setting. Talk to your care team about the use of this medication in children. Special care may be needed. Overdosage: If you think you have taken too much of this medicine contact a poison control center or emergency room at once. NOTE: This medicine is only for you. Do not share this medicine with others. What if I miss a dose? Keep appointments for follow-up doses. It is important not to miss your dose. Call your care team if you are unable to keep an appointment. What may interact with this medication? Certain antibiotics, such as clarithromycin or telithromycin Certain antivirals for HIV or AIDS Certain medications for fungal infections like ketoconazole, itraconazole, and voriconazole Nefazodone This list may not describe all possible interactions. Give your health care provider a list of all the medicines, herbs, non-prescription drugs, or dietary supplements you use. Also tell them if you smoke, drink alcohol, or use illegal drugs. Some items may interact with your medicine. What should I watch for while using this medication? This medication may make you feel generally unwell. This is  not uncommon as chemotherapy can affect healthy cells as well as cancer cells. Report any side effects. Continue your course of treatment even though you feel ill unless your care team tells you to stop. You may need blood work while you are taking this medication. This medication may increase your risk of getting an infection. Call your care team for advice if you get a fever, chills, sore throat, or other symptoms of a cold or flu. Do not treat yourself. Try to avoid being around people who are sick. Avoid taking medications that contain aspirin, acetaminophen, ibuprofen, naproxen, or ketoprofen unless instructed by your care team. These medications may hide a fever. Be careful brushing or flossing your teeth or using a toothpick because you may get an infection or bleed more easily. If you have any dental work done, tell your dentist you are receiving this medication. This medication can cause serious infusion reactions. To reduce the risk, your care team may give you other medications to take before receiving this one. Be sure to follow the directions from your care team. Use a condom during sex while taking this medication and for 4 months after the last dose. Talk to your care team right away if your partner may be pregnant. This medication can cause serious birth defects. This medication may cause infertility. Talk to your care team if you are concerned about your fertility. What side effects may I notice from receiving this medication? Side effects that you should report to your care team as soon as possible: Allergic reactions--skin rash, itching, hives, swelling of the face, lips, tongue, or throat Diarrhea, nausea, vomiting Dry cough, shortness of breath or  trouble breathing Infection--fever, chills, cough, or sore throat Kidney injury--decrease in the amount of urine, swelling of the ankles, hands, or feet Pain, tingling, or numbness in the hands or feet Red or dark brown urine Stomach  bleeding--bloody or black, tar-like stools, vomiting blood or brown material that looks like coffee grounds Stomach pain that is severe, does not go away, or gets worse Unusual bruising or bleeding Side effects that usually do not require medical attention (report these to your care team if they continue or are bothersome): Loss of appetite Unusual weakness or fatigue This list may not describe all possible side effects. Call your doctor for medical advice about side effects. You may report side effects to FDA at 1-800-FDA-1088. Where should I keep my medication? This medication is given in a hospital or clinic. It will not be stored at home. NOTE: This sheet is a summary. It may not cover all possible information. If you have questions about this medicine, talk to your doctor, pharmacist, or health care provider.  2024 Elsevier/Gold Standard (2023-02-11 00:00:00)

## 2023-08-03 NOTE — Telephone Encounter (Signed)
 Pt called , states he is going to stay here for his treatment. He will see us  on 08/05/23 . I sent above message to Dr Almer Jacobson, and Amalia Badder, [facility].

## 2023-08-03 NOTE — Telephone Encounter (Signed)
 Attempted to reach patient regarding approval to treat at this location has been ok'd

## 2023-08-03 NOTE — Progress Notes (Signed)
 Ok to treat with labs from 07/27/23 per Dr. Almer Jacobson

## 2023-08-05 ENCOUNTER — Inpatient Hospital Stay

## 2023-08-05 ENCOUNTER — Telehealth: Payer: Self-pay

## 2023-08-05 VITALS — BP 142/58 | HR 63 | Temp 98.1°F | Resp 18 | Ht 67.0 in | Wt 181.0 lb

## 2023-08-05 DIAGNOSIS — Z5111 Encounter for antineoplastic chemotherapy: Secondary | ICD-10-CM | POA: Diagnosis not present

## 2023-08-05 DIAGNOSIS — C7951 Secondary malignant neoplasm of bone: Secondary | ICD-10-CM

## 2023-08-05 DIAGNOSIS — C61 Malignant neoplasm of prostate: Secondary | ICD-10-CM

## 2023-08-05 MED ORDER — PEGFILGRASTIM-FPGK 6 MG/0.6ML ~~LOC~~ SOSY
6.0000 mg | PREFILLED_SYRINGE | Freq: Once | SUBCUTANEOUS | Status: AC
  Administered 2023-08-05: 6 mg via SUBCUTANEOUS
  Filled 2023-08-05: qty 0.6

## 2023-08-05 NOTE — Telephone Encounter (Signed)
-----   Message from [name] sent at 08/03/2023  5:48 PM EDT ----- Regarding: call His PSA went way back up but his treatment had been on hold, we will see if it comes back down now that he is back on therapy

## 2023-08-05 NOTE — Telephone Encounter (Signed)
 Called patient and notified him of the message

## 2023-08-15 ENCOUNTER — Other Ambulatory Visit: Payer: Self-pay | Admitting: Oncology

## 2023-08-15 ENCOUNTER — Other Ambulatory Visit: Payer: Self-pay

## 2023-08-15 DIAGNOSIS — E876 Hypokalemia: Secondary | ICD-10-CM

## 2023-08-15 MED ORDER — POTASSIUM CHLORIDE CRYS ER 10 MEQ PO TBCR: 10.0000 meq | EXTENDED_RELEASE_TABLET | Freq: Two times a day (BID) | ORAL | 1 refills | Status: DC

## 2023-08-16 ENCOUNTER — Telehealth: Payer: Self-pay

## 2023-08-16 ENCOUNTER — Encounter: Payer: Self-pay | Admitting: Oncology

## 2023-08-16 NOTE — Telephone Encounter (Signed)
 Pt states that the pharmacy told him they have not received prescription for the potassium.

## 2023-08-18 NOTE — Assessment & Plan Note (Signed)
 CTA abdomen and CT urogram were done in the ER at [facility] earlier this week to evaluate hematuria.  These revealed increased bilateral ureteral mucosal enhancement with periureteral fat stranding, which may reflect underlying urinary tract infection. Please correlate with urinalysis. No evidence of pyelonephritis. Mild bilateral hydroureter, left greater than right, with a possible enhancing 3 mm lesion within the mid right ureter. No urinary tract calculi. Further evaluation with retrograde evaluation may be useful. Limited evaluation of the bladder due to decompressed state. Possible asymmetric right inferolateral bladder wall thickening. Further evaluation with cystoscopy may be useful. 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   Due to the need for invasive procedure, cycle 9 of chemotherapy was held.  He saw Dr. Traci Fridge urology at [facility]. She performed cystoscopy, bilateral retrograde pyelogram, bilateral ureteroscopy and with ureteral stent placement in May.  Evaluation was negative.  The ureteral stent was removed.

## 2023-08-18 NOTE — Assessment & Plan Note (Addendum)
 He had an upper GI hemorrhage earlier this year and was referred to Dr. Monico Anna. Fortunately, he just had the one episode and was transfused. His hemoglobin came up to 11.1 and has remained stable. He had a EGD scheduled in April, but it was canceled. His cardiologist Dr. Katheryne Pane was not comfortable with him stopping clopidogrel  for another year. His bleeding has stopped and so we will not pursue an EGD at this time unless he begins having melena again and/or a significant drop in his hemoglobin.  His hemoglobin has remained stable. He denies recurrent hematuria or other obvious blood loss. Chemotherapy and bone metastasis can be contributing. We will continue to monitor this.

## 2023-08-18 NOTE — Progress Notes (Addendum)
 [facility]  [address] [place],  [place]  [phone]   Addendum: His PSA increased to 1013.  Previously it had increased to 822 from 438 after his treatment was on hold for cystoscopy.  As the increase is minimal, I advised patient I recommended continuing to monitor this.  Clinic Day:  08/24/2023  Referring physician: Paniagua, Tiziana, MD  ASSESSMENT & PLAN:   Assessment & Plan: Malignant neoplasm of prostate (HCC) Stage IVA prostate cancer diagnosed in January 2016.  He had progressive disease in the bone and lung in September 2023, so was placed on docetaxel  chemotherapy and continued leuprolide  and zoledronic  acid.  His PSA went down from 536 to 400.75 after 3 cycles, then to 284 with the 5th cycle, and then 203 in March 2024. PSA then went up to 261 in May. We decided to give him a break from chemotherapy at that time.  We continued leuprolide  and zolendronic acid every 3 months. The PSA was 1156. PSMA PET in September 2024 revealed progression of skeletal metastasis. Multiple new radiotracer avid lesions within the spine. Individual lesions are increased in size compared to prior accompanied by sclerotic CT change. 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 No evidence of nodal metastasis or visceral metastasis.   He is receiving palliative chemotherapy with cabazitaxel /prednisone .  He continues leuprolide  and zoledronic  acid every 3 months.  The PSA has fluctuated up and down somewhat, but had decreased to 371.57 in March from 542 in February.  PSA was 426 in early April.  He was in the ER at [facility] in April for gross hematuria.  CTA abdomen and CT urogram were done and revealed increased bilateral ureteral mucosal enhancement with periureteral fat stranding, which may reflect underlying urinary tract infection. No evidence of pyelonephritis. Mild bilateral hydroureter, left greater than right, with a possible enhancing 3 mm  lesion within the mid right ureter. No urinary tract calculi.  Further evaluation with retrograde evaluation may be useful. Limited evaluation of the bladder due to decompressed state. Possible asymmetric right inferolateral bladder wall thickening. Further evaluation with cystoscopy may be useful. 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   He was scheduled to see urology for cystoscopy/ureteroscopy, so cycle 9 of chemotherapy was held. Evaluation was negative.  The PSA was 238 in late April we resumed chemotherapy in May.  His PSA increased to 822 in May prior to resuming chemotherapy.  PSA is pending from today.  He will proceed with a 10th cycle of cabazitaxel /prednisone  today.  He continues pegfilgrastim  with the each cycle of chemotherapy to prevent complications of prolonged neutropenia.  His last leuprolide  was in March, so he is due for that today.  We will plan to see him back in 3 weeks with a CBC and comprehensive metabolic panel prior to an 11th cycle of cabazitaxel /prednisone .  Secondary malignant neoplasm of bone and bone marrow (HCC) He continues zoledronic  acid every 3 months in addition to his cancer therapy.  He is currently receiving chemotherapy with cabazitaxel /prednisone .  His last dose of zoledronic  acid was in March, so he is due for that at this time.  Abnormal radiologic findings on diagnostic imaging of renal pelvis, ureter, or bladder CTA abdomen and CT urogram were done in the ER at [facility] earlier this week to evaluate hematuria.  These revealed increased bilateral ureteral mucosal enhancement with periureteral fat stranding, which may reflect underlying urinary tract infection. Please correlate  with urinalysis. No evidence of pyelonephritis. Mild bilateral hydroureter, left greater than right, with a possible enhancing 3 mm lesion within the mid right ureter. No urinary tract calculi. Further evaluation with retrograde evaluation  may be useful. Limited evaluation of the bladder due to decompressed state. Possible asymmetric right inferolateral bladder wall thickening. Further evaluation with cystoscopy may be useful. 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   Due to the need for invasive procedure, cycle 9 of chemotherapy was held.  He saw Dr. Traci Fridge urology at [facility]. She performed cystoscopy, bilateral retrograde pyelogram, bilateral ureteroscopy and with ureteral stent placement in May.  Evaluation was negative.  The ureteral stent was removed.  Anemia He had an upper GI hemorrhage earlier this year and was referred to Dr. Monico Anna. Fortunately, he just had the one episode and was transfused. His hemoglobin came up to 11.1 and has remained stable. He had a EGD scheduled in April, but it was canceled. His cardiologist Dr. Katheryne Pane was not comfortable with him stopping clopidogrel  for another year. His bleeding has stopped and so we will not pursue an EGD at this time unless he begins having melena again and/or a significant drop in his hemoglobin.  His hemoglobin has remained stable. He denies recurrent hematuria or other obvious blood loss. Chemotherapy and bone metastasis can be contributing. We will continue to monitor this.    The patient understands the plans discussed today and is in agreement with them.  He knows to contact our office if he develops concerns prior to his next appointment.   I provided 20 minutes of face-to-face time during this encounter and > 50% was spent counseling as documented under my assessment and plan.    Mehtaab Mayeda A Lilliam Chamblee, PA-C  [facility] DE[facility] OF [facility]. [facility] [address] [place] [address] Dept: [phone] Dept Fax: [phone]   No orders of the defined types were placed in this encounter.     CHIEF COMPLAINT:  CC: Stage IVA prostate cancer  Current Treatment:  Cabazitaxel /prednisone /leuprolide   HISTORY OF PRESENT ILLNESS:   Oncology History  Malignant neoplasm of prostate (HCC)  03/30/2014 Cancer Staging   Staging form: Prostate, AJCC 8th Edition - Clinical stage from 03/30/2014: Stage IVA (cT3b, cN1, cM0, PSA: 51.8, Grade Group: 4) - Signed by [name], MD on 11/14/2020 Histopathologic type: Adenocarcinoma, NOS Stage prefix: Initial diagnosis Prostate specific antigen (PSA) range: 20 or greater Gleason primary pattern: 4 Gleason secondary pattern: 4 Gleason score: 8 Histologic grading system: 5 grade system Location of positive needle core biopsies: Beyond primary site Prognostic indicators: May 2016 Robotic prostatectomy with mult. Pos margins, extra prostatic extension, 2/6 pos nodes. Scan June 2016 positive at T10  PSA up to 216.3 by August Placed on lupron  and casodex Stage used in treatment planning: Yes National guidelines used in treatment planning: Yes Type of national guideline used in treatment planning: NCCN   02/18/2020 Initial Diagnosis   Malignant neoplasm of prostate (HCC)   12/01/2021 - 07/19/2022 Chemotherapy   Patient is on Treatment Plan : PROSTATE Docetaxel  (75) + Prednisone  q21d     12/21/2022 -  Chemotherapy   Patient is on Treatment Plan : PROSTATE Cabazitaxel  (20) D1 + Prednisone  D1-21 q21d     Secondary malignant neoplasm of bone and bone marrow (HCC)  02/18/2020 Initial Diagnosis   Secondary malignant neoplasm of bone and bone marrow (HCC)   12/01/2021 -  07/19/2022 Chemotherapy   Patient is on Treatment Plan : PROSTATE Docetaxel  (75) + Prednisone  q21d     12/21/2022 -  Chemotherapy   Patient is on Treatment Plan : PROSTATE Cabazitaxel  (20) D1 + Prednisone  D1-21 q21d     Malignant neoplasm of prostate metastatic to lung (HCC)  11/25/2021 Initial Diagnosis   Malignant neoplasm of prostate metastatic to lung (HCC)   12/21/2022 -  Chemotherapy   Patient is on Treatment Plan : PROSTATE Cabazitaxel  (20) D1  + Prednisone  D1-21 q21d         INTERVAL HISTORY:   Marvin Johnson is here today for repeat clinical assessment prior to 10th cycle of cabazitaxel /prednisone .  He states he tolerated his 9th cycle fairly well, except for persistent fatigue.  He receives pegfilgrastim  with each cycle to prevent complications of prolonged neutropenia.  He continues prednisone  5 mg twice daily.  His last leuprolide  and denosumab were in March, so he is due for those today.  He denies fevers or chills. He denies pain. His appetite is good. His weight has been stable.  REVIEW OF SYSTEMS:   Review of Systems  Constitutional:  Positive for fatigue. Negative for appetite change, chills, diaphoresis, fever and unexpected weight change.  HENT:   Negative for lump/mass, mouth sores, nosebleeds and sore throat.   Respiratory:  Negative for cough, hemoptysis and shortness of breath.   Cardiovascular:  Negative for chest pain and leg swelling.  Gastrointestinal:  Negative for abdominal pain, blood in stool, constipation, diarrhea, nausea and vomiting.  Genitourinary:  Negative for difficulty urinating, dysuria, frequency and hematuria.   Musculoskeletal:  Negative for arthralgias, back pain, gait problem, myalgias and neck pain.  Skin:  Negative for itching, rash and wound.  Neurological:  Negative for dizziness, extremity weakness, gait problem, headaches, light-headedness and numbness.  Hematological:  Negative for adenopathy.  Psychiatric/Behavioral:  Negative for depression and sleep disturbance. The patient is not nervous/anxious.      VITALS:   Blood pressure 120/65, pulse 65, temperature 98.6 F (37 C), temperature source Oral, resp. rate 18, height 5' 7 (1.702 m), weight 180 lb 6.4 oz (81.8 kg), SpO2 98%.  Wt Readings from Last 3 Encounters:  08/24/23 180 lb 6.4 oz (81.8 kg)  08/05/23 181 lb (82.1 kg)  08/03/23 180 lb (81.6 kg)    Body mass index is 28.25 kg/m.  Performance status (ECOG): 1 - Symptomatic but  completely ambulatory    PHYSICAL EXAM:   Physical Exam Vitals and nursing note reviewed.  Constitutional:      General: He is not in acute distress.    Appearance: Normal appearance. He is normal weight.  HENT:     Head: Normocephalic and atraumatic.     Mouth/Throat:     Mouth: Mucous membranes are moist.     Pharynx: Oropharynx is clear. No oropharyngeal exudate or posterior oropharyngeal erythema.  Eyes:     General: No scleral icterus.    Extraocular Movements: Extraocular movements intact.     Conjunctiva/sclera: Conjunctivae normal.     Pupils: Pupils are equal, round, and reactive to light.  Cardiovascular:     Rate and Rhythm: Normal rate and regular rhythm.     Heart sounds: Normal heart sounds. No murmur heard.    No friction rub. No gallop.  Pulmonary:     Effort: Pulmonary effort is normal.     Breath sounds: Normal breath sounds. No wheezing, rhonchi or rales.  Abdominal:     General: Bowel sounds are normal.  There is no distension.     Palpations: Abdomen is soft. There is no hepatomegaly, splenomegaly or mass.     Tenderness: There is no abdominal tenderness.  Musculoskeletal:        General: Normal range of motion.     Cervical back: Normal range of motion and neck supple. No tenderness.     Right lower leg: No edema.     Left lower leg: No edema.  Lymphadenopathy:     Cervical: No cervical adenopathy.     Upper Body:     Right upper body: No supraclavicular or axillary adenopathy.     Left upper body: No supraclavicular or axillary adenopathy.     Lower Body: No right inguinal adenopathy. No left inguinal adenopathy.  Skin:    General: Skin is warm and dry.     Coloration: Skin is not jaundiced.     Findings: No rash.  Neurological:     Mental Status: He is alert and oriented to person, place, and time.     Cranial Nerves: No cranial nerve deficit.  Psychiatric:        Mood and Affect: Mood normal.        Behavior: Behavior normal.        Thought  Content: Thought content normal.     LABS:      Latest Ref Rng & Units 08/24/2023    9:38 AM 07/27/2023   10:00 AM 07/06/2023    8:12 AM  CBC  WBC 4.0 - 10.5 K/uL 9.3  8.8  7.9   Hemoglobin 13.0 - 17.0 g/dL 16.1  09.6  04.5   Hematocrit 39.0 - 52.0 % 38.9  37.4  37.7   Platelets 150 - 400 K/uL 231  270  206       Latest Ref Rng & Units 08/24/2023    9:38 AM 07/27/2023   10:00 AM 07/06/2023    8:12 AM  CMP  Glucose 70 - 99 mg/dL 409  811  99   BUN 8 - 23 mg/dL 13  14  17    Creatinine 0.61 - 1.24 mg/dL 9.14  7.82  9.56   Sodium 135 - 145 mmol/L 139  138  140   Potassium 3.5 - 5.1 mmol/L 3.8  3.3  4.4   Chloride 98 - 111 mmol/L 102  101  104   CO2 22 - 32 mmol/L 22  23  24    Calcium 8.9 - 10.3 mg/dL 9.7  9.8  9.2   Total Protein 6.5 - 8.1 g/dL 6.8  6.8  6.8   Total Bilirubin 0.0 - 1.2 mg/dL 0.3  0.4  0.4   Alkaline Phos 38 - 126 U/L 101  95  75   AST 15 - 41 U/L 27  26  30    ALT 0 - 44 U/L 13  13  22      Lab Results  Component Value Date   PSA1 1,362.0 (H) 02/01/2023    Lab Results  Component Value Date   TIBC 398 01/29/2022   FERRITIN 206 01/29/2022   IRONPCTSAT 25 01/29/2022     STUDIES:   No results found.    HISTORY:   Past Medical History:  Diagnosis Date   Hyperlipidemia    Hypokalemia 03/24/2023   Malignant neoplasm of prostate (HCC)    Supraventricular tachycardia (HCC)     Past Surgical History:  Procedure Laterality Date   CORONARY STENT INTERVENTION N/A 02/08/2022   Procedure: CORONARY STENT INTERVENTION;  Surgeon:  End, Veryl Gottron, MD;  Location: [facility] INVASIVE CV LAB;  Service: Cardiovascular;  Laterality: N/A;   LEFT HEART CATH AND CORONARY ANGIOGRAPHY N/A 02/08/2022   Procedure: LEFT HEART CATH AND CORONARY ANGIOGRAPHY;  Surgeon: Sammy Crisp, MD;  Location: [facility] INVASIVE CV LAB;  Service: Cardiovascular;  Laterality: N/A;   PROSTATECTOMY  07/2014   TONSILLECTOMY      Family History  Problem Relation Age of Onset   Breast cancer  Mother 83   Ovarian cancer Mother 34   Kidney Stones Sister    Breast cancer Paternal Grandmother 3    Social History:  reports that he has never smoked. He has never used smokeless tobacco. He reports that he does not currently use alcohol. He reports that he does not use drugs.The patient is alone today.  Allergies:  Allergies  Allergen Reactions   Atorvastatin    Rosuvastatin Other (See Comments)    Other reaction(s): Muscle pain   Simvastatin Other (See Comments)    Other reaction(s): Joint pain, Muscle pain   Sulfa Antibiotics    Sulfamethoxazole-Trimethoprim Other (See Comments)    Unknown as to interaction was a baby.    Current Medications: Current Outpatient Medications  Medication Sig Dispense Refill   Alirocumab 75 MG/ML SOAJ Inject 75 mg into the skin.     Cholecalciferol 25 MCG (1000 UT) tablet Take 1,000 Units by mouth in the morning and at bedtime.     clopidogrel  (PLAVIX ) 75 MG tablet Take 75 mg by mouth daily.     diphenhydrAMINE  (BENADRYL ) 25 mg capsule Take 1 capsule by mouth at bedtime.     diphenhydrAMINE  (BENADRYL ) 50 MG capsule Take 50 mg by mouth at bedtime as needed.     ezetimibe  (ZETIA ) 10 MG tablet Take 10 mg by mouth daily.     guaiFENesin-codeine 100-10 MG/5ML syrup Take 5-10 mLs by mouth every 4 (four) hours as needed.     isosorbide mononitrate (IMDUR) 30 MG 24 hr tablet Take 1 tablet by mouth every 8 (eight) hours as needed.     leuprolide  (LUPRON ) 22.5 MG injection Inject 22.5 mg into the muscle every 3 (three) months.     metoprolol  tartrate (LOPRESSOR ) 25 MG tablet Take 12.5 mg by mouth 2 (two) times daily.     morphine  (MS CONTIN ) 15 MG 12 hr tablet Take 1 tablet (15 mg total) by mouth 2 (two) times daily. 60 tablet 0   Multiple Vitamins-Minerals (MULTIVITAMIN WITH MINERALS) tablet Take 1 tablet by mouth daily.     nitroGLYCERIN  (NITROSTAT ) 0.4 MG SL tablet Place 0.4 mg under the tongue every 5 (five) minutes as needed for chest pain. Repeat  every 5 minutes if needed for a total of 3 tablets in 15 minutes. If no relief, CALL 911.     Omega-3 Fatty Acids (FISH OIL) 1000 MG CAPS Take 2,000 mg by mouth in the morning and at bedtime.     ondansetron  (ZOFRAN ) 8 MG tablet Take 8 mg by mouth every 8 (eight) hours as needed for nausea or vomiting.     oxybutynin (DITROPAN) 5 MG tablet Take 5 mg by mouth.     polyethylene glycol powder (GLYCOLAX/MIRALAX) 17 GM/SCOOP powder Take 1 Container by mouth once.     potassium chloride  (KLOR-CON  M) 10 MEQ tablet TAKE 1 TABLET(10 MEQ) BY MOUTH TWICE DAILY 180 tablet 3   predniSONE  (DELTASONE ) 5 MG tablet Take 1 tablet (5 mg total) by mouth 2 (two) times daily with a meal. 60 tablet 5  prochlorperazine  (COMPAZINE ) 10 MG tablet Take 1 tablet (10 mg total) by mouth every 6 (six) hours as needed for nausea or vomiting. 30 tablet 2   Royal Jelly-Bee Pollen-Ginseng (KOREAN GINSENG COMPLEX) 150-250-50 MG CAPS Take 1 capsule by mouth daily.     triamcinolone  (KENALOG ) 0.025 % cream Apply 1 Application topically 2 (two) times daily. 30 g 5   Zoledronic  Acid (ZOMETA ) 4 MG/100ML IVPB Inject 4 mg into the vein every 3 (three) months.     No current facility-administered medications for this visit.   Facility-Administered Medications Ordered in Other Visits  Medication Dose Route Frequency Provider Last Rate Last Admin   0.9 %  sodium chloride  infusion   Intravenous Once [name], MD       sodium chloride  flush (NS) 0.9 % injection 10 mL  10 mL Intravenous PRN [name], MD   10 mL at 03/17/23 0953   sodium chloride  flush (NS) 0.9 % injection 10 mL  10 mL Intracatheter PRN [name], MD   10 mL at 06/15/23 1234   sodium chloride  flush (NS) 0.9 % injection 10 mL  10 mL Intravenous PRN [name], MD   10 mL at 07/06/23 0928   sodium chloride  flush (NS) 0.9 % injection 10 mL  10 mL Intravenous PRN [name], MD   10 mL at 07/27/23 1114   sodium chloride  flush  (NS) 0.9 % injection 10 mL  10 mL Intracatheter PRN [name], MD   10 mL at 08/24/23 1327

## 2023-08-18 NOTE — Assessment & Plan Note (Signed)
 He continues zoledronic  acid every 3 months in addition to his cancer therapy.  He is currently receiving chemotherapy with cabazitaxel /prednisone .  His last dose of zoledronic  acid was in March, so he is due for that at this time.

## 2023-08-18 NOTE — Assessment & Plan Note (Addendum)
 Stage IVA prostate cancer diagnosed in January 2016.  He had progressive disease in the bone and lung in September 2023, so was placed on docetaxel  chemotherapy and continued leuprolide  and zoledronic  acid.  His PSA went down from 536 to 400.75 after 3 cycles, then to 284 with the 5th cycle, and then 203 in March 2024. PSA then went up to 261 in May. We decided to give him a break from chemotherapy at that time.  We continued leuprolide  and zolendronic acid every 3 months. The PSA was 1156. PSMA PET in September 2024 revealed progression of skeletal metastasis. Multiple new radiotracer avid lesions within the spine. Individual lesions are increased in size compared to prior accompanied by sclerotic CT change. 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 No evidence of nodal metastasis or visceral metastasis.   He is receiving palliative chemotherapy with cabazitaxel /prednisone .  He continues leuprolide  and zoledronic  acid every 3 months.  The PSA has fluctuated up and down somewhat, but had decreased to 371.57 in March from 542 in February.  PSA was 426 in early April.  He was in the ER at [facility] in April for gross hematuria.  CTA abdomen and CT urogram were done and revealed increased bilateral ureteral mucosal enhancement with periureteral fat stranding, which may reflect underlying urinary tract infection. No evidence of pyelonephritis. Mild bilateral hydroureter, left greater than right, with a possible enhancing 3 mm lesion within the mid right ureter. No urinary tract calculi.  Further evaluation with retrograde evaluation may be useful. Limited evaluation of the bladder due to decompressed state. Possible asymmetric right inferolateral bladder wall thickening. Further evaluation with cystoscopy may be useful. 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   He was scheduled to see urology for cystoscopy/ureteroscopy, so cycle 9 of chemotherapy  was held. Evaluation was negative.  The PSA was 238 in late April we resumed chemotherapy in May.  His PSA increased to 822 in May prior to resuming chemotherapy.  PSA is pending from today.  He will proceed with a 10th cycle of cabazitaxel /prednisone  today.  He continues pegfilgrastim  with the each cycle of chemotherapy to prevent complications of prolonged neutropenia.  His last leuprolide  was in March, so he is due for that today.  We will plan to see him back in 3 weeks with a CBC and comprehensive metabolic panel prior to an 11th cycle of cabazitaxel /prednisone .

## 2023-08-23 ENCOUNTER — Encounter: Payer: Self-pay | Admitting: Oncology

## 2023-08-24 ENCOUNTER — Inpatient Hospital Stay

## 2023-08-24 ENCOUNTER — Inpatient Hospital Stay: Attending: Oncology | Admitting: Hematology and Oncology

## 2023-08-24 ENCOUNTER — Encounter: Payer: Self-pay | Admitting: Hematology and Oncology

## 2023-08-24 VITALS — BP 120/65 | HR 65 | Temp 98.6°F | Resp 18 | Ht 67.0 in | Wt 180.4 lb

## 2023-08-24 DIAGNOSIS — Z79899 Other long term (current) drug therapy: Secondary | ICD-10-CM | POA: Diagnosis not present

## 2023-08-24 DIAGNOSIS — C61 Malignant neoplasm of prostate: Secondary | ICD-10-CM

## 2023-08-24 DIAGNOSIS — Z7952 Long term (current) use of systemic steroids: Secondary | ICD-10-CM | POA: Insufficient documentation

## 2023-08-24 DIAGNOSIS — Z5189 Encounter for other specified aftercare: Secondary | ICD-10-CM | POA: Insufficient documentation

## 2023-08-24 DIAGNOSIS — E876 Hypokalemia: Secondary | ICD-10-CM | POA: Diagnosis not present

## 2023-08-24 DIAGNOSIS — D649 Anemia, unspecified: Secondary | ICD-10-CM | POA: Diagnosis not present

## 2023-08-24 DIAGNOSIS — R5383 Other fatigue: Secondary | ICD-10-CM | POA: Insufficient documentation

## 2023-08-24 DIAGNOSIS — Z7902 Long term (current) use of antithrombotics/antiplatelets: Secondary | ICD-10-CM | POA: Diagnosis not present

## 2023-08-24 DIAGNOSIS — E785 Hyperlipidemia, unspecified: Secondary | ICD-10-CM | POA: Diagnosis not present

## 2023-08-24 DIAGNOSIS — C7951 Secondary malignant neoplasm of bone: Secondary | ICD-10-CM | POA: Diagnosis not present

## 2023-08-24 DIAGNOSIS — Z5111 Encounter for antineoplastic chemotherapy: Secondary | ICD-10-CM | POA: Insufficient documentation

## 2023-08-24 DIAGNOSIS — I471 Supraventricular tachycardia, unspecified: Secondary | ICD-10-CM | POA: Diagnosis not present

## 2023-08-24 DIAGNOSIS — Z803 Family history of malignant neoplasm of breast: Secondary | ICD-10-CM | POA: Diagnosis not present

## 2023-08-24 DIAGNOSIS — C7952 Secondary malignant neoplasm of bone marrow: Secondary | ICD-10-CM

## 2023-08-24 DIAGNOSIS — Z8041 Family history of malignant neoplasm of ovary: Secondary | ICD-10-CM | POA: Diagnosis not present

## 2023-08-24 DIAGNOSIS — R9341 Abnormal radiologic findings on diagnostic imaging of renal pelvis, ureter, or bladder: Secondary | ICD-10-CM | POA: Diagnosis not present

## 2023-08-24 LAB — CBC WITH DIFFERENTIAL (CANCER CENTER ONLY)
Abs Immature Granulocytes: 0.06 10*3/uL (ref 0.00–0.07)
Basophils Absolute: 0.1 10*3/uL (ref 0.0–0.1)
Basophils Relative: 1 %
Eosinophils Absolute: 0 10*3/uL (ref 0.0–0.5)
Eosinophils Relative: 0 %
HCT: 38.9 % — ABNORMAL LOW (ref 39.0–52.0)
Hemoglobin: 12 g/dL — ABNORMAL LOW (ref 13.0–17.0)
Immature Granulocytes: 1 %
Lymphocytes Relative: 30 %
Lymphs Abs: 2.8 10*3/uL (ref 0.7–4.0)
MCH: 28.8 pg (ref 26.0–34.0)
MCHC: 30.8 g/dL (ref 30.0–36.0)
MCV: 93.5 fL (ref 80.0–100.0)
Monocytes Absolute: 0.6 10*3/uL (ref 0.1–1.0)
Monocytes Relative: 6 %
Neutro Abs: 5.8 10*3/uL (ref 1.7–7.7)
Neutrophils Relative %: 62 %
Platelet Count: 231 10*3/uL (ref 150–400)
RBC: 4.16 MIL/uL — ABNORMAL LOW (ref 4.22–5.81)
RDW: 14.9 % (ref 11.5–15.5)
WBC Count: 9.3 10*3/uL (ref 4.0–10.5)
nRBC: 0 % (ref 0.0–0.2)

## 2023-08-24 LAB — CMP (CANCER CENTER ONLY)
ALT: 13 U/L (ref 0–44)
AST: 27 U/L (ref 15–41)
Albumin: 4.4 g/dL (ref 3.5–5.0)
Alkaline Phosphatase: 101 U/L (ref 38–126)
Anion gap: 14 (ref 5–15)
BUN: 13 mg/dL (ref 8–23)
CO2: 22 mmol/L (ref 22–32)
Calcium: 9.7 mg/dL (ref 8.9–10.3)
Chloride: 102 mmol/L (ref 98–111)
Creatinine: 0.86 mg/dL (ref 0.61–1.24)
GFR, Estimated: >60 mL/min (ref >60)
Glucose, Bld: 158 mg/dL — ABNORMAL HIGH (ref 70–99)
Potassium: 3.8 mmol/L (ref 3.5–5.1)
Sodium: 139 mmol/L (ref 135–145)
Total Bilirubin: 0.3 mg/dL (ref 0.0–1.2)
Total Protein: 6.8 g/dL (ref 6.5–8.1)

## 2023-08-24 LAB — PSA: Prostatic Specific Antigen: 1013.69 ng/mL — ABNORMAL HIGH (ref 0.00–4.00)

## 2023-08-24 MED ORDER — DIPHENHYDRAMINE HCL 50 MG/ML IJ SOLN
25.0000 mg | Freq: Once | INTRAMUSCULAR | Status: AC
  Administered 2023-08-24: 25 mg via INTRAVENOUS
  Filled 2023-08-24: qty 1

## 2023-08-24 MED ORDER — LEUPROLIDE ACETATE (3 MONTH) 22.5 MG IM KIT
22.5000 mg | PACK | Freq: Once | INTRAMUSCULAR | Status: AC
  Administered 2023-08-24: 22.5 mg via INTRAMUSCULAR
  Filled 2023-08-24: qty 22.5

## 2023-08-24 MED ORDER — HEPARIN SOD (PORK) LOCK FLUSH 100 UNIT/ML IV SOLN
500.0000 [IU] | Freq: Once | INTRAVENOUS | Status: AC | PRN
  Administered 2023-08-24: 500 [IU]

## 2023-08-24 MED ORDER — SODIUM CHLORIDE 0.9 % IV SOLN: Freq: Once | INTRAVENOUS | Status: DC

## 2023-08-24 MED ORDER — SODIUM CHLORIDE 0.9 % IV SOLN: Freq: Once | INTRAVENOUS | Status: AC

## 2023-08-24 MED ORDER — FAMOTIDINE IN NACL 20-0.9 MG/50ML-% IV SOLN
20.0000 mg | Freq: Once | INTRAVENOUS | Status: AC
  Administered 2023-08-24: 20 mg via INTRAVENOUS
  Filled 2023-08-24: qty 50

## 2023-08-24 MED ORDER — DEXAMETHASONE SODIUM PHOSPHATE 10 MG/ML IJ SOLN
10.0000 mg | Freq: Once | INTRAMUSCULAR | Status: AC
  Administered 2023-08-24: 10 mg via INTRAVENOUS
  Filled 2023-08-24: qty 1

## 2023-08-24 MED ORDER — ZOLEDRONIC ACID 4 MG/100ML IV SOLN
4.0000 mg | Freq: Once | INTRAVENOUS | Status: AC
  Administered 2023-08-24: 4 mg via INTRAVENOUS
  Filled 2023-08-24: qty 100

## 2023-08-24 MED ORDER — SODIUM CHLORIDE 0.9% FLUSH
10.0000 mL | INTRAVENOUS | Status: DC | PRN
  Administered 2023-08-24: 10 mL

## 2023-08-24 MED ORDER — SODIUM CHLORIDE 0.9 % IV SOLN
20.0000 mg/m2 | Freq: Once | INTRAVENOUS | Status: AC
  Administered 2023-08-24: 41 mg via INTRAVENOUS
  Filled 2023-08-24: qty 4.1

## 2023-08-24 NOTE — Patient Instructions (Signed)
 Cabazitaxel  Injection What is this medication? CABAZITAXEL  (ka BAZ i TAX el) treats prostate cancer. It works by slowing down the growth of cancer cells. This medicine may be used for other purposes; ask your health care provider or pharmacist if you have questions. COMMON BRAND NAME(S): Jevtana  What should I tell my care team before I take this medication? They need to know if you have any of these conditions: Kidney problems Liver disease Low white blood cell levels Lung disease Stomach or intestine problems An unusual or allergic reaction to cabazitaxel , polysorbate 80, other medications, foods, dyes, or preservatives Pregnant or trying to get pregnant Breast-feeding How should I use this medication? This medication is injected into a vein. It is given by your care team in a hospital or clinic setting. Talk to your care team about the use of this medication in children. Special care may be needed. Overdosage: If you think you have taken too much of this medicine contact a poison control center or emergency room at once. NOTE: This medicine is only for you. Do not share this medicine with others. What if I miss a dose? Keep appointments for follow-up doses. It is important not to miss your dose. Call your care team if you are unable to keep an appointment. What may interact with this medication? Certain antibiotics, such as clarithromycin or telithromycin Certain antivirals for HIV or AIDS Certain medications for fungal infections like ketoconazole, itraconazole, and voriconazole Nefazodone This list may not describe all possible interactions. Give your health care provider a list of all the medicines, herbs, non-prescription drugs, or dietary supplements you use. Also tell them if you smoke, drink alcohol, or use illegal drugs. Some items may interact with your medicine. What should I watch for while using this medication? This medication may make you feel generally unwell. This is  not uncommon as chemotherapy can affect healthy cells as well as cancer cells. Report any side effects. Continue your course of treatment even though you feel ill unless your care team tells you to stop. You may need blood work while you are taking this medication. This medication may increase your risk of getting an infection. Call your care team for advice if you get a fever, chills, sore throat, or other symptoms of a cold or flu. Do not treat yourself. Try to avoid being around people who are sick. Avoid taking medications that contain aspirin , acetaminophen , ibuprofen, naproxen, or ketoprofen unless instructed by your care team. These medications may hide a fever. Be careful brushing or flossing your teeth or using a toothpick because you may get an infection or bleed more easily. If you have any dental work done, tell your dentist you are receiving this medication. This medication can cause serious infusion reactions. To reduce the risk, your care team may give you other medications to take before receiving this one. Be sure to follow the directions from your care team. Use a condom during sex while taking this medication and for 4 months after the last dose. Talk to your care team right away if your partner may be pregnant. This medication can cause serious birth defects. This medication may cause infertility. Talk to your care team if you are concerned about your fertility. What side effects may I notice from receiving this medication? Side effects that you should report to your care team as soon as possible: Allergic reactions--skin rash, itching, hives, swelling of the face, lips, tongue, or throat Diarrhea, nausea, vomiting Dry cough, shortness of breath or  trouble breathing Infection--fever, chills, cough, or sore throat Kidney injury--decrease in the amount of urine, swelling of the ankles, hands, or feet Pain, tingling, or numbness in the hands or feet Red or dark brown urine Stomach  bleeding--bloody or black, tar-like stools, vomiting blood or brown material that looks like coffee grounds Stomach pain that is severe, does not go away, or gets worse Unusual bruising or bleeding Side effects that usually do not require medical attention (report these to your care team if they continue or are bothersome): Loss of appetite Unusual weakness or fatigue This list may not describe all possible side effects. Call your doctor for medical advice about side effects. You may report side effects to FDA at 1-800-FDA-1088. Where should I keep my medication? This medication is given in a hospital or clinic. It will not be stored at home. NOTE: This sheet is a summary. It may not cover all possible information. If you have questions about this medicine, talk to your doctor, pharmacist, or health care provider.  2024 [employer] Standard (2023-02-11 00:00:00)Leuprolide  Solution for Injection What is this medication? LEUPROLIDE  (loo PROE lide) reduces the symptoms of prostate cancer. It works by decreasing levels of the hormone testosterone in the body. This prevents prostate cancer cells from spreading or growing. This medicine may be used for other purposes; ask your health care provider or pharmacist if you have questions. COMMON BRAND NAME(S): Lupron  What should I tell my care team before I take this medication? They need to know if you have any of these conditions: Diabetes Heart attack Heart disease High blood pressure High cholesterol Pain or difficulty passing urine Spinal cord metastasis Stroke Tobacco use An unusual or allergic reaction to leuprolide , other medications, foods, dyes, or preservatives Pregnant or trying to get pregnant Breast-feeding How should I use this medication? This medication is for injection under the skin or into a muscle. You will be taught how to prepare and give this medication. Use exactly as directed. Take your medication at regular intervals.  Do not take it more often than directed. It is important that you put your used needles and syringes in a special sharps container. Do not put them in a trash can. If you do not have a sharps container, call your care team to get one. A special MedGuide will be given to you by the pharmacist with each prescription and refill. Be sure to read this information carefully each time. Talk to your care team about the use of this medication in children. While this medication may be prescribed for children as young as 8 years for selected conditions, precautions do apply. Overdosage: If you think you have taken too much of this medicine contact a poison control center or emergency room at once. NOTE: This medicine is only for you. Do not share this medicine with others. What if I miss a dose? If you miss a dose, take it as soon as you can. If it is almost time for your next dose, take only that dose. Do not take double or extra doses. What may interact with this medication? Do not take this medication with any of the following: Chasteberry Cisapride Dronedarone Pimozide Thioridazine This medication may also interact with the following: Estrogen or progestin hormones Herbal or dietary supplements, like black cohosh or DHEA Other medications that cause heart rhythm changes Testosterone This list may not describe all possible interactions. Give your health care provider a list of all the medicines, herbs, non-prescription drugs, or dietary supplements you  use. Also tell them if you smoke, drink alcohol, or use illegal drugs. Some items may interact with your medicine. What should I watch for while using this medication? Visit your care team for regular checks on your progress. During the first week, your symptoms may get worse, but then will improve as you continue your treatment. You may get hot flashes, increased bone pain, increased difficulty passing urine, or an aggravation of nerve symptoms. Discuss  these effects with your care team, some of them may improve with continued use of this medication. Patients may experience a menstrual cycle or spotting during the first 2 months of therapy with this medication. If this continues, contact your care team. This medication may increase blood sugar. The risk may be higher in patients who already have diabetes. Ask your care team what you can do to lower your risk of diabetes while taking this medication. What side effects may I notice from receiving this medication? Side effects that you should report to your care team as soon as possible: Allergic reactions--skin rash, itching, hives, swelling of the face, lips, tongue, or throat Heart attack--pain or tightness in the chest, shoulders, arms, or jaw, nausea, shortness of breath, cold or clammy skin, feeling faint or lightheaded Heart rhythm changes--fast or irregular heartbeat, dizziness, feeling faint or lightheaded, chest pain, trouble breathing High blood sugar (hyperglycemia)--increased thirst or amount of urine, unusual weakness or fatigue, blurry vision New or worsening seizures Redness, blistering, peeling, or loosening of the skin, including inside the mouth Stroke--sudden numbness or weakness of the face, arm, or leg, trouble speaking, confusion, trouble walking, loss of balance or coordination, dizziness, severe headache, change in vision Swelling and pain of the tumor site or lymph nodes Side effects that usually do not require medical attention (report these to your care team if they continue or are bothersome): Change in sex drive or performance Hot flashes Joint pain Pain, redness, or irritation at injection site Swelling of the ankles, hands, or feet Unusual weakness or fatigue This list may not describe all possible side effects. Call your doctor for medical advice about side effects. You may report side effects to FDA at 1-800-FDA-1088. Where should I keep my medication? Keep out  of the reach of children and pets. Store below 25 degrees C (77 degrees F). Do not freeze. Protect from light. Get rid of any unused medication after the expiration date. To get rid of medications that are no longer needed or have expired: Take the medication to a medication take-back program. Check with your pharmacy or law enforcement to find a location. If you cannot return the medication, ask your pharmacist or care team how to get rid of this medication safely. NOTE: This sheet is a summary. It may not cover all possible information. If you have questions about this medicine, talk to your doctor, pharmacist, or health care provider.  2024 [employer]/Gold Standard (2023-02-11 00:00:00)Zoledronic  Acid Injection (Cancer) What is this medication? ZOLEDRONIC  ACID (ZOE le dron ik AS id) treats high calcium levels in the blood caused by cancer. It may also be used with chemotherapy to treat weakened bones caused by cancer. It works by slowing down the release of calcium from bones. This lowers calcium levels in your blood. It also makes your bones stronger and less likely to break (fracture). It belongs to a group of medications called bisphosphonates. This medicine may be used for other purposes; ask your health care provider or pharmacist if you have questions. COMMON BRAND NAME(S): Zometa ,  Zometa  Powder What should I tell my care team before I take this medication? They need to know if you have any of these conditions: Dehydration Dental disease Kidney disease Liver disease Low levels of calcium in the blood Lung or breathing disease, such as asthma Receiving steroids, such as dexamethasone  or prednisone  An unusual or allergic reaction to zoledronic  acid, other medications, foods, dyes, or preservatives Pregnant or trying to get pregnant Breast-feeding How should I use this medication? This medication is injected into a vein. It is given by your care team in a hospital or clinic  setting. Talk to your care team about the use of this medication in children. Special care may be needed. Overdosage: If you think you have taken too much of this medicine contact a poison control center or emergency room at once. NOTE: This medicine is only for you. Do not share this medicine with others. What if I miss a dose? Keep appointments for follow-up doses. It is important not to miss your dose. Call your care team if you are unable to keep an appointment. What may interact with this medication? Certain antibiotics given by injection Diuretics, such as bumetanide, furosemide NSAIDs, medications for pain and inflammation, such as ibuprofen or naproxen Teriparatide Thalidomide This list may not describe all possible interactions. Give your health care provider a list of all the medicines, herbs, non-prescription drugs, or dietary supplements you use. Also tell them if you smoke, drink alcohol, or use illegal drugs. Some items may interact with your medicine. What should I watch for while using this medication? Visit your care team for regular checks on your progress. It may be some time before you see the benefit from this medication. Some people who take this medication have severe bone, joint, or muscle pain. This medication may also increase your risk for jaw problems or a broken thigh bone. Tell your care team right away if you have severe pain in your jaw, bones, joints, or muscles. Tell you care team if you have any pain that does not go away or that gets worse. Tell your dentist and dental surgeon that you are taking this medication. You should not have major dental surgery while on this medication. See your dentist to have a dental exam and fix any dental problems before starting this medication. Take good care of your teeth while on this medication. Make sure you see your dentist for regular follow-up appointments. You should make sure you get enough calcium and vitamin D while you  are taking this medication. Discuss the foods you eat and the vitamins you take with your care team. Check with your care team if you have severe diarrhea, nausea, and vomiting, or if you sweat a lot. The loss of too much body fluid may make it dangerous for you to take this medication. You may need bloodwork while taking this medication. Talk to your care team if you wish to become pregnant or think you might be pregnant. This medication can cause serious birth defects. What side effects may I notice from receiving this medication? Side effects that you should report to your care team as soon as possible: Allergic reactions--skin rash, itching, hives, swelling of the face, lips, tongue, or throat Kidney injury--decrease in the amount of urine, swelling of the ankles, hands, or feet Low calcium level--muscle pain or cramps, confusion, tingling, or numbness in the hands or feet Osteonecrosis of the jaw--pain, swelling, or redness in the mouth, numbness of the jaw, poor healing after  dental work, unusual discharge from the mouth, visible bones in the mouth Severe bone, joint, or muscle pain Side effects that usually do not require medical attention (report to your care team if they continue or are bothersome): Constipation Fatigue Fever Loss of appetite Nausea Stomach pain This list may not describe all possible side effects. Call your doctor for medical advice about side effects. You may report side effects to FDA at 1-800-FDA-1088. Where should I keep my medication? This medication is given in a hospital or clinic. It will not be stored at home. NOTE: This sheet is a summary. It may not cover all possible information. If you have questions about this medicine, talk to your doctor, pharmacist, or health care provider.  2024 [employer]/Gold Standard (2021-04-24 00:00:00)

## 2023-08-26 ENCOUNTER — Other Ambulatory Visit: Payer: Self-pay

## 2023-08-26 ENCOUNTER — Inpatient Hospital Stay

## 2023-08-26 VITALS — BP 130/71 | HR 64 | Temp 97.7°F | Resp 18 | Ht 67.0 in

## 2023-08-26 DIAGNOSIS — Z5111 Encounter for antineoplastic chemotherapy: Secondary | ICD-10-CM | POA: Diagnosis not present

## 2023-08-26 DIAGNOSIS — C61 Malignant neoplasm of prostate: Secondary | ICD-10-CM

## 2023-08-26 DIAGNOSIS — C7951 Secondary malignant neoplasm of bone: Secondary | ICD-10-CM

## 2023-08-26 MED ORDER — PEGFILGRASTIM-FPGK 6 MG/0.6ML ~~LOC~~ SOSY
6.0000 mg | PREFILLED_SYRINGE | Freq: Once | SUBCUTANEOUS | Status: AC
  Administered 2023-08-26: 6 mg via SUBCUTANEOUS
  Filled 2023-08-26: qty 0.6

## 2023-08-26 NOTE — Patient Instructions (Signed)

## 2023-08-30 ENCOUNTER — Other Ambulatory Visit: Payer: Self-pay

## 2023-08-30 DIAGNOSIS — C7951 Secondary malignant neoplasm of bone: Secondary | ICD-10-CM

## 2023-08-30 DIAGNOSIS — C61 Malignant neoplasm of prostate: Secondary | ICD-10-CM

## 2023-08-30 DIAGNOSIS — M898X9 Other specified disorders of bone, unspecified site: Secondary | ICD-10-CM

## 2023-08-30 MED ORDER — MORPHINE SULFATE ER 15 MG PO TBCR: 15.0000 mg | EXTENDED_RELEASE_TABLET | Freq: Two times a day (BID) | ORAL | 0 refills | Status: DC

## 2023-09-09 ENCOUNTER — Encounter: Payer: Self-pay | Admitting: Oncology

## 2023-09-13 NOTE — Progress Notes (Signed)
 [facility]  [address] [place],  [place]  [address] [phone]    Clinic Day:  09/14/23  Referring physician: Paniagua, Tiziana, MD  ASSESSMENT & PLAN:  Assessment: Malignant neoplasm of prostate [facility]) Stage IVA prostate cancer diagnosed in January 2016.  Marvin Johnson had progressive disease in the bone and lung in September 2023, so was placed on docetaxel  chemotherapy and continued leuprolide  and zoledronic  acid.  Marvin Johnson PSA went down from 536 to 400.75 after 3 cycles, then to 284 with the 5th cycle, and then 203 in March 2024. PSA then went up to 261 in May. We decided to give Marvin Johnson a break from chemotherapy at that time.  We continued leuprolide  and zolendronic acid every 3 months. The PSA was 1156. PSMA PET in September 2024 revealed progression of skeletal metastasis. Multiple new radiotracer avid lesions within the spine. Individual lesions are increased in size compared to prior accompanied by sclerotic CT change. 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 No evidence of nodal metastasis or visceral metastasis. Marvin Johnson is receiving palliative chemotherapy with cabazitaxel /prednisone . We will stop Marvin Johnson chemotherapy at this time due to significantly rising PSA. PSA was 426 in early April. Marvin Johnson last PSA in June increased from 822.82 to 1,013.69 within a month.     Secondary malignant neoplasm of bone and bone marrow (HCC) Marvin Johnson continues zoledronic  acid every 3 months in addition to Marvin Johnson cancer therapy.  Marvin Johnson is currently receiving chemotherapy with cabazitaxel /prednisone .  Marvin Johnson last dose of zoledronic  acid was on June, 13th so Marvin Johnson will be due again in early September.    Abnormal radiologic findings on diagnostic imaging of renal pelvis, ureter, or bladder CTA abdomen and CT urogram were done in the ER at [facility] last month to evaluate hematuria.  These revealed increased bilateral ureteral mucosal enhancement with periureteral fat stranding, which may reflect underlying urinary  tract infection.. No evidence of pyelonephritis. Mild bilateral hydroureter, left greater than right, with a possible enhancing 3 mm lesion within the mid right ureter. No urinary tract calculi. Limited evaluation of the bladder due to decompressed state. Possible asymmetric right inferolateral bladder wall thickening. Cystoscopy was negative by Dr. Lyell, urology at [facility]. She performed cystoscopy, bilateral retrograde pyelogram, bilateral ureteroscopy and with ureteral stent placement in May.  Evaluation was negative.  The ureteral stent was removed.   Anemia Marvin Johnson had an upper GI hemorrhage earlier this year and was referred to Dr. Larene. Fortunately, Marvin Johnson just had the one episode and was transfused. Marvin Johnson hemoglobin came up to 11.1 and has remained stable. Marvin Johnson had a EGD scheduled in April, but it was canceled. Marvin Johnson cardiologist Dr. Court was not comfortable with Marvin Johnson stopping clopidogrel  for another year. Marvin Johnson bleeding has stopped and so we will not pursue an EGD at this time unless Marvin Johnson begins having melena again and/or a significant drop in Marvin Johnson hemoglobin.  Marvin Johnson hemoglobin has remained stable. Marvin Johnson denies recurrent hematuria or other obvious blood loss. Chemotherapy and bone metastasis can be contributing. We will continue to monitor this.   Plan: Marvin Johnson day 1 cycle 11 of Jevtana  is scheduled on 09/14/2023 but we will stop Marvin Johnson chemotherapy at this time due to significantly rising PSA. Marvin Johnson has a WBC of 9.2, low hemoglobin of 11.1 down from 12.0, and platelet count of 202,000. Marvin Johnson CMP is normal other than a elevated glucose of 140. Marvin Johnson last PSA in June increased from 822.82 to 1,013.69 within a month. We discussed what this could mean and I feel as  it is time to stop the Jevtana  and rescan sometime soon. Marvin Johnson last PET scan was done in September, 2024 and so we will probably repeat this in September, 2025. I also mentioned a newly approved FDA therapy called Pluvicto. Marvin Johnson will return in 1 month for repeat CBC, CMP,  PSA, and I will see Marvin Johnson back in 2 months with CBC, CMP, PSA, and PSMA PET. Marvin Johnson will also be due for Marvin Johnson Lupron  injection in September.  The patient understands the plans discussed today and is in agreement with them.  Marvin Johnson knows to contact our office if Marvin Johnson develops concerns prior to Marvin Johnson next appointment.  I provided 20 minutes of face-to-face time during this encounter and > 50% was spent counseling as documented under my assessment and plan.   [name], MD  [facility] A DEPT OF [facility] [address] [place] [address] Dept: [phone] Dept Fax: [phone]   No orders of the defined types were placed in this encounter.   CHIEF COMPLAINT:  CC: Stage IVA prostate cancer  Current Treatment: leuprolide  and zoledronic  acid  HISTORY OF PRESENT ILLNESS:   Oncology History  Malignant neoplasm of prostate (HCC)  03/30/2014 Cancer Staging   Staging form: Prostate, AJCC 8th Edition - Clinical stage from 03/30/2014: Stage IVA (cT3b, cN1, cM0, PSA: 51.8, Grade Group: 4) - Signed by Cornish Wanda VEAR, MD on 11/14/2020 Histopathologic type: Adenocarcinoma, NOS Stage prefix: Initial diagnosis Prostate specific antigen (PSA) range: 20 or greater Gleason primary pattern: 4 Gleason secondary pattern: 4 Gleason score: 8 Histologic grading system: 5 grade system Location of positive needle core biopsies: Beyond primary site Prognostic indicators: May 2016 Robotic prostatectomy with mult. Pos margins, extra prostatic extension, 2/6 pos nodes. Scan June 2016 positive at T10  PSA up to 216.3 by August Placed on lupron  and casodex Stage used in treatment planning: Yes National guidelines used in treatment planning: Yes Type of national guideline used in treatment planning: NCCN   02/18/2020 Initial Diagnosis   Malignant neoplasm of prostate (HCC)   12/01/2021 - 07/19/2022 Chemotherapy   Patient is on Treatment Plan :  PROSTATE Docetaxel  (75) + Prednisone  q21d     12/21/2022 - 08/26/2023 Chemotherapy   Patient is on Treatment Plan : PROSTATE Cabazitaxel  (20) D1 + Prednisone  D1-21 q21d     Secondary malignant neoplasm of bone and bone marrow (HCC)  02/18/2020 Initial Diagnosis   Secondary malignant neoplasm of bone and bone marrow (HCC)   12/01/2021 - 07/19/2022 Chemotherapy   Patient is on Treatment Plan : PROSTATE Docetaxel  (75) + Prednisone  q21d     12/21/2022 - 08/26/2023 Chemotherapy   Patient is on Treatment Plan : PROSTATE Cabazitaxel  (20) D1 + Prednisone  D1-21 q21d     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 Patient states that Marvin Johnson feels well and has no complaints of pain. Marvin Johnson day 1 cycle 11 of Jevtana  is scheduled on 09/14/2023 but we will stop Marvin Johnson chemotherapy at this time due to significantly rising PSA. Marvin Johnson has a WBC of 9.2, low hemoglobin of 11.1 down from 12.0, and platelet count  of 202,000. Marvin Johnson CMP is normal other than a elevated glucose of 140. Marvin Johnson last PSA in June increased from 822.82 to 1,013.69 within a month. We discussed what this could mean and I feel as it is time to stop the Jevtana  and rescan at soon. Marvin Johnson last PET scan was done in September, 2024 and so we will probably repeat this in September, 2025. I also mentioned a newly approved FDA therapy called Pluvicto. Marvin Johnson will return in 1 month for repeat CBC, CMP, PSA, and I will also see Marvin Johnson back in 2 months with CBC, CMP, PSA, and PSMA PET. Marvin Johnson will be due for Marvin Johnson Lupron  injection and zoledronic  acid in September.    Marvin Johnson denies fever, chills, night sweats, or other signs of infection. Marvin Johnson denies cardiorespiratory  and gastrointestinal issues. Marvin Johnson  denies pain. Marvin Johnson appetite is good and Marvin Johnson weight has been stable.   REVIEW OF SYSTEMS:   Review of Systems  Constitutional:  Positive for fatigue. Negative for appetite change, chills, diaphoresis, fever and unexpected weight change.  HENT:  Negative.  Negative for lump/mass, mouth sores, nosebleeds and sore throat.   Eyes: Negative.   Respiratory: Negative.  Negative for chest tightness, cough, hemoptysis, shortness of breath and wheezing.   Cardiovascular: Negative.  Negative for chest pain, leg swelling and palpitations.  Gastrointestinal: Negative.  Negative for abdominal distention, abdominal pain, blood in stool, constipation, diarrhea, nausea and vomiting.  Endocrine: Negative.  Negative for hot flashes.  Genitourinary: Negative.  Negative for difficulty urinating, dysuria, frequency and hematuria.   Musculoskeletal:  Negative for arthralgias, back pain, flank pain, gait problem, myalgias and neck pain.  Skin: Negative.  Negative for itching, rash and wound.  Neurological: Negative.  Negative for dizziness, extremity weakness, gait problem, headaches, light-headedness, numbness, seizures and speech difficulty.  Hematological: Negative.  Negative for adenopathy. Does not bruise/bleed easily.  Psychiatric/Behavioral: Negative.  Negative for depression and sleep disturbance. The patient is not nervous/anxious.      VITALS:   Blood pressure 125/67, pulse (!) 59, temperature 97.7 F (36.5 C), temperature source Oral, resp. rate 18, height 5' 7 (1.702 m), weight 180 lb 14.4 oz (82.1 kg), SpO2 100%.  Wt Readings from Last 3 Encounters:  09/14/23 180 lb 14.4 oz (82.1 kg)  08/24/23 180 lb 6.4 oz (81.8 kg)  08/05/23 181 lb (82.1 kg)    Body mass index is 28.33 kg/m.  Performance status (ECOG): 1 - Symptomatic but completely ambulatory    PHYSICAL EXAM:   Physical Exam Vitals and nursing note reviewed.  Constitutional:      General: Marvin Johnson is not in  acute distress.    Appearance: Normal appearance. Marvin Johnson is normal weight.  HENT:     Head: Normocephalic and atraumatic.     Right Ear: Tympanic membrane, ear canal and external ear normal.     Left Ear: Tympanic membrane, ear canal and external ear normal.     Nose: Nose normal.     Mouth/Throat:     Mouth: Mucous membranes are moist.     Pharynx: Oropharynx is clear. No oropharyngeal exudate or posterior oropharyngeal erythema.  Eyes:     General: No scleral icterus.    Extraocular Movements: Extraocular movements intact.     Conjunctiva/sclera: Conjunctivae normal.     Pupils: Pupils are equal, round, and reactive to light.  Cardiovascular:     Rate and Rhythm: Regular rhythm. Bradycardia present.     Pulses: Normal pulses.     Heart sounds: Normal  heart sounds. No murmur heard.    No friction rub. No gallop.  Pulmonary:     Effort: Pulmonary effort is normal.     Breath sounds: Normal breath sounds. No wheezing, rhonchi or rales.  Abdominal:     General: Bowel sounds are normal. There is no distension.     Palpations: Abdomen is soft. There is no hepatomegaly, splenomegaly or mass.     Tenderness: There is no abdominal tenderness.  Musculoskeletal:        General: Normal range of motion.     Cervical back: Normal range of motion and neck supple. No tenderness.     Right lower leg: No edema.     Left lower leg: No edema.  Lymphadenopathy:     Cervical: No cervical adenopathy.     Upper Body:     Right upper body: No supraclavicular or axillary adenopathy.     Left upper body: No supraclavicular or axillary adenopathy.     Lower Body: No right inguinal adenopathy. No left inguinal adenopathy.  Skin:    General: Skin is warm and dry.     Coloration: Skin is not jaundiced.     Findings: No rash.  Neurological:     General: No focal deficit present.     Mental Status: Marvin Johnson is alert and oriented to person, place, and time. Mental status is at baseline.     Cranial Nerves: No  cranial nerve deficit.  Psychiatric:        Mood and Affect: Mood normal.        Behavior: Behavior normal.        Thought Content: Thought content normal.        Judgment: Judgment normal.     LABS:      Latest Ref Rng & Units 09/14/2023    8:53 AM 08/24/2023    9:38 AM 07/27/2023   10:00 AM  CBC  WBC 4.0 - 10.5 K/uL 9.2  9.3  8.8   Hemoglobin 13.0 - 17.0 g/dL 88.8  87.9  88.2   Hematocrit 39.0 - 52.0 % 36.3  38.9  37.4   Platelets 150 - 400 K/uL 202  231  270       Latest Ref Rng & Units 09/14/2023    8:53 AM 08/24/2023    9:38 AM 07/27/2023   10:00 AM  CMP  Glucose 70 - 99 mg/dL 859  841  824   BUN 8 - 23 mg/dL 19  13  14    Creatinine 0.61 - 1.24 mg/dL 9.13  9.13  9.04   Sodium 135 - 145 mmol/L 139  139  138   Potassium 3.5 - 5.1 mmol/L 3.5  3.8  3.3   Chloride 98 - 111 mmol/L 103  102  101   CO2 22 - 32 mmol/L 24  22  23    Calcium 8.9 - 10.3 mg/dL 9.2  9.7  9.8   Total Protein 6.5 - 8.1 g/dL 6.6  6.8  6.8   Total Bilirubin 0.0 - 1.2 mg/dL 0.3  0.3  0.4   Alkaline Phos 38 - 126 U/L 82  101  95   AST 15 - 41 U/L 24  27  26    ALT 0 - 44 U/L 16  13  13     Component Ref Range & Units (hover) 2 wk ago (08/24/23) 1 mo ago (07/27/23) 2 mo ago (07/06/23) 3 mo ago (06/15/23) 3 mo ago (05/25/23) 4 mo ago (05/04/23) 5 mo ago (  04/13/23)  Prostatic Specific Antigen 1,013.69 High  822.82 High  CM 438.43 High  CM 426.49 High  CM 371.57 High  CM 542.00 High  CM 423.96 High      Lab Results  Component Value Date   PSA1 1,362.0 (H) 02/01/2023   PSA1 1,317.0 (H) 01/06/2023   PSA1 334.0 (H) 10/30/2021   Lab Results  Component Value Date   TIBC 398 01/29/2022   FERRITIN 206 01/29/2022   IRONPCTSAT 25 01/29/2022   STUDIES:   No results found.    HISTORY:   Past Medical History:  Diagnosis Date   Hyperlipidemia    Hypokalemia 03/24/2023   Malignant neoplasm of prostate (HCC)    Supraventricular tachycardia (HCC)     Past Surgical History:  Procedure Laterality Date    CORONARY STENT INTERVENTION N/A 02/08/2022   Procedure: CORONARY STENT INTERVENTION;  Surgeon: Mady Bruckner, MD;  Location: [facility] INVASIVE CV [facility];  Service: Cardiovascular;  Laterality: N/A;   LEFT HEART CATH AND CORONARY ANGIOGRAPHY N/A 02/08/2022   Procedure: LEFT HEART CATH AND CORONARY ANGIOGRAPHY;  Surgeon: Mady Bruckner, MD;  Location: [facility] INVASIVE CV LAB;  Service: Cardiovascular;  Laterality: N/A;   PROSTATECTOMY  07/2014   TONSILLECTOMY      Family History  Problem Relation Age of Onset   Breast cancer Mother 40   Ovarian cancer Mother 53   Kidney Stones Sister    Breast cancer Paternal Grandmother 30    Social History:  reports that Marvin Johnson has never smoked. Marvin Johnson has never used smokeless tobacco. Marvin Johnson reports that Marvin Johnson does not currently use alcohol. Marvin Johnson reports that Marvin Johnson does not use drugs.The patient is alone today.  Allergies:  Allergies  Allergen Reactions   Atorvastatin    Rosuvastatin Other (See Comments)    Other reaction(s): Muscle pain   Simvastatin Other (See Comments)    Other reaction(s): Joint pain, Muscle pain   Sulfa Antibiotics    Sulfamethoxazole-Trimethoprim Other (See Comments)    Unknown as to interaction was a baby.    Current Medications: Current Outpatient Medications  Medication Sig Dispense Refill   Alirocumab 75 MG/ML SOAJ Inject 75 mg into the skin.     Cholecalciferol 25 MCG (1000 UT) tablet Take 1,000 Units by mouth in the morning and at bedtime.     clopidogrel  (PLAVIX ) 75 MG tablet Take 75 mg by mouth daily.     diphenhydrAMINE  (BENADRYL ) 25 mg capsule Take 1 capsule by mouth at bedtime.     diphenhydrAMINE  (BENADRYL ) 50 MG capsule Take 50 mg by mouth at bedtime as needed.     ezetimibe  (ZETIA ) 10 MG tablet Take 10 mg by mouth daily.     fluconazole  (DIFLUCAN ) 200 MG tablet Take 1 tablet (200 mg total) by mouth daily. 10 tablet 0   guaiFENesin-codeine 100-10 MG/5ML syrup Take 5-10 mLs by mouth every 4 (four) hours as needed.     isosorbide  mononitrate (IMDUR) 30 MG 24 hr tablet Take 1 tablet by mouth every 8 (eight) hours as needed.     leuprolide  (LUPRON ) 22.5 MG injection Inject 22.5 mg into the muscle every 3 (three) months.     metoprolol  tartrate (LOPRESSOR ) 25 MG tablet Take 0.5 tablets (12.5 mg total) by mouth 2 (two) times daily. 120 tablet 1   morphine  (MS CONTIN ) 15 MG 12 hr tablet Take 1 tablet (15 mg total) by mouth 2 (two) times daily. 60 tablet 0   Multiple Vitamins-Minerals (MULTIVITAMIN WITH MINERALS) tablet Take 1 tablet by mouth  daily.     nitroGLYCERIN  (NITROSTAT ) 0.4 MG SL tablet Place 0.4 mg under the tongue every 5 (five) minutes as needed for chest pain. Repeat every 5 minutes if needed for a total of 3 tablets in 15 minutes. If no relief, CALL 911.     Omega-3 Fatty Acids (FISH OIL) 1000 MG CAPS Take 2,000 mg by mouth in the morning and at bedtime.     ondansetron  (ZOFRAN ) 8 MG tablet Take 8 mg by mouth every 8 (eight) hours as needed for nausea or vomiting.     oxybutynin (DITROPAN) 5 MG tablet Take 5 mg by mouth.     polyethylene glycol powder (GLYCOLAX/MIRALAX) 17 GM/SCOOP powder Take 1 Container by mouth once.     potassium chloride  (KLOR-CON  M) 10 MEQ tablet TAKE 1 TABLET(10 MEQ) BY MOUTH TWICE DAILY 180 tablet 3   predniSONE  (DELTASONE ) 5 MG tablet Take 1 tablet (5 mg total) by mouth 2 (two) times daily with a meal. 60 tablet 5   prochlorperazine  (COMPAZINE ) 10 MG tablet Take 1 tablet (10 mg total) by mouth every 6 (six) hours as needed for nausea or vomiting. 30 tablet 2   Royal Jelly-Bee Pollen-Ginseng (KOREAN GINSENG COMPLEX) 150-250-50 MG CAPS Take 1 capsule by mouth daily.     triamcinolone  (KENALOG ) 0.025 % cream Apply 1 Application topically 2 (two) times daily. 30 g 5   Zoledronic  Acid (ZOMETA ) 4 MG/100ML IVPB Inject 4 mg into the vein every 3 (three) months.     No current facility-administered medications for this visit.   Facility-Administered Medications Ordered in Other Visits  Medication  Dose Route Frequency Provider Last Rate Last Admin   sodium chloride  flush (NS) 0.9 % injection 10 mL  10 mL Intravenous PRN Cornelius Wanda DEL, MD   10 mL at 03/17/23 0953   sodium chloride  flush (NS) 0.9 % injection 10 mL  10 mL Intracatheter PRN Cornelius Wanda DEL, MD   10 mL at 06/15/23 1234   sodium chloride  flush (NS) 0.9 % injection 10 mL  10 mL Intravenous PRN Cornelius Wanda DEL, MD   10 mL at 07/06/23 9071   sodium chloride  flush (NS) 0.9 % injection 10 mL  10 mL Intravenous PRN Cornelius Wanda DEL, MD   10 mL at 07/27/23 1114   sodium chloride  flush (NS) 0.9 % injection 10 mL  10 mL Intracatheter PRN Cornelius Wanda DEL, MD   10 mL at 09/14/23 9046    I,Jasmine M Lassiter,acting as a scribe for Wanda DEL Cornelius, MD.,have documented all relevant documentation on the behalf of Wanda DEL Cornelius, MD,as directed by  Wanda DEL Cornelius, MD while in the presence of Wanda DEL Cornelius, MD.

## 2023-09-14 ENCOUNTER — Inpatient Hospital Stay: Attending: Oncology

## 2023-09-14 ENCOUNTER — Inpatient Hospital Stay

## 2023-09-14 ENCOUNTER — Other Ambulatory Visit: Payer: Self-pay | Admitting: Oncology

## 2023-09-14 ENCOUNTER — Inpatient Hospital Stay (HOSPITAL_BASED_OUTPATIENT_CLINIC_OR_DEPARTMENT_OTHER): Admitting: Oncology

## 2023-09-14 ENCOUNTER — Encounter: Payer: Self-pay | Admitting: Oncology

## 2023-09-14 ENCOUNTER — Telehealth: Payer: Self-pay | Admitting: Oncology

## 2023-09-14 VITALS — BP 125/67 | HR 59 | Temp 97.7°F | Resp 18 | Ht 67.0 in | Wt 180.9 lb

## 2023-09-14 DIAGNOSIS — C7951 Secondary malignant neoplasm of bone: Secondary | ICD-10-CM | POA: Insufficient documentation

## 2023-09-14 DIAGNOSIS — R739 Hyperglycemia, unspecified: Secondary | ICD-10-CM | POA: Diagnosis not present

## 2023-09-14 DIAGNOSIS — Z9221 Personal history of antineoplastic chemotherapy: Secondary | ICD-10-CM | POA: Diagnosis not present

## 2023-09-14 DIAGNOSIS — D649 Anemia, unspecified: Secondary | ICD-10-CM | POA: Insufficient documentation

## 2023-09-14 DIAGNOSIS — C61 Malignant neoplasm of prostate: Secondary | ICD-10-CM | POA: Diagnosis present

## 2023-09-14 DIAGNOSIS — Z8041 Family history of malignant neoplasm of ovary: Secondary | ICD-10-CM | POA: Diagnosis not present

## 2023-09-14 DIAGNOSIS — C7952 Secondary malignant neoplasm of bone marrow: Secondary | ICD-10-CM

## 2023-09-14 DIAGNOSIS — C7800 Secondary malignant neoplasm of unspecified lung: Secondary | ICD-10-CM | POA: Insufficient documentation

## 2023-09-14 DIAGNOSIS — Z803 Family history of malignant neoplasm of breast: Secondary | ICD-10-CM | POA: Insufficient documentation

## 2023-09-14 LAB — CMP (CANCER CENTER ONLY)
ALT: 16 U/L (ref 0–44)
AST: 24 U/L (ref 15–41)
Albumin: 4.4 g/dL (ref 3.5–5.0)
Alkaline Phosphatase: 82 U/L (ref 38–126)
Anion gap: 12 (ref 5–15)
BUN: 19 mg/dL (ref 8–23)
CO2: 24 mmol/L (ref 22–32)
Calcium: 9.2 mg/dL (ref 8.9–10.3)
Chloride: 103 mmol/L (ref 98–111)
Creatinine: 0.86 mg/dL (ref 0.61–1.24)
GFR, Estimated: >60 mL/min (ref >60)
Glucose, Bld: 140 mg/dL — ABNORMAL HIGH (ref 70–99)
Potassium: 3.5 mmol/L (ref 3.5–5.1)
Sodium: 139 mmol/L (ref 135–145)
Total Bilirubin: 0.3 mg/dL (ref 0.0–1.2)
Total Protein: 6.6 g/dL (ref 6.5–8.1)

## 2023-09-14 LAB — CBC WITH DIFFERENTIAL (CANCER CENTER ONLY)
Abs Immature Granulocytes: 0.09 10*3/uL — ABNORMAL HIGH (ref 0.00–0.07)
Basophils Absolute: 0.1 10*3/uL (ref 0.0–0.1)
Basophils Relative: 1 %
Eosinophils Absolute: 0 10*3/uL (ref 0.0–0.5)
Eosinophils Relative: 0 %
HCT: 36.3 % — ABNORMAL LOW (ref 39.0–52.0)
Hemoglobin: 11.1 g/dL — ABNORMAL LOW (ref 13.0–17.0)
Immature Granulocytes: 1 %
Lymphocytes Relative: 32 %
Lymphs Abs: 3 10*3/uL (ref 0.7–4.0)
MCH: 28.2 pg (ref 26.0–34.0)
MCHC: 30.6 g/dL (ref 30.0–36.0)
MCV: 92.4 fL (ref 80.0–100.0)
Monocytes Absolute: 0.8 10*3/uL (ref 0.1–1.0)
Monocytes Relative: 9 %
Neutro Abs: 5.2 10*3/uL (ref 1.7–7.7)
Neutrophils Relative %: 57 %
Platelet Count: 202 10*3/uL (ref 150–400)
RBC: 3.93 MIL/uL — ABNORMAL LOW (ref 4.22–5.81)
RDW: 15.7 % — ABNORMAL HIGH (ref 11.5–15.5)
WBC Count: 9.2 10*3/uL (ref 4.0–10.5)
nRBC: 0 % (ref 0.0–0.2)

## 2023-09-14 MED ORDER — SODIUM CHLORIDE 0.9% FLUSH
10.0000 mL | INTRAVENOUS | Status: AC | PRN
  Administered 2023-09-14: 10 mL

## 2023-09-14 MED ORDER — HEPARIN SOD (PORK) LOCK FLUSH 100 UNIT/ML IV SOLN
500.0000 [IU] | Freq: Once | INTRAVENOUS | Status: AC | PRN
Start: 2023-09-14 — End: 2023-09-14
  Administered 2023-09-14: 500 [IU]

## 2023-09-14 NOTE — Addendum Note (Signed)
 Addended by: MAL OLAM RAMAN on: 09/14/2023 09:54 AM   Modules accepted: Orders

## 2023-09-14 NOTE — Telephone Encounter (Signed)
 Patient has been scheduled for follow-up visit per 09/14/23 LOS.  Pt given an appt calendar with date and time.

## 2023-09-15 ENCOUNTER — Inpatient Hospital Stay

## 2023-09-15 ENCOUNTER — Other Ambulatory Visit: Payer: Self-pay

## 2023-09-15 DIAGNOSIS — C61 Malignant neoplasm of prostate: Secondary | ICD-10-CM

## 2023-09-15 LAB — PSA: Prostatic Specific Antigen: 1061.05 ng/mL — ABNORMAL HIGH (ref 0.00–4.00)

## 2023-09-27 ENCOUNTER — Other Ambulatory Visit: Payer: Self-pay | Admitting: Hematology and Oncology

## 2023-09-27 ENCOUNTER — Other Ambulatory Visit: Payer: Self-pay

## 2023-09-27 ENCOUNTER — Telehealth: Payer: Self-pay

## 2023-09-27 MED ORDER — FLUCONAZOLE 200 MG PO TABS: 200.0000 mg | ORAL_TABLET | Freq: Every day | ORAL | 0 refills | Status: DC

## 2023-09-27 MED ORDER — METOPROLOL TARTRATE 25 MG PO TABS: 12.5000 mg | ORAL_TABLET | Freq: Two times a day (BID) | ORAL | 1 refills | Status: AC

## 2023-09-27 NOTE — Telephone Encounter (Signed)
 I reviewed pt's chart until I found note regarding the rash:   05/25/2023 Marvin Johnson note - During physical exam his rash of the upper anterior chest presents to be yeast like, I will order fluconazole  200mg  daily for 10 days.    Could you send in to [facility] on [address]?

## 2023-09-29 ENCOUNTER — Other Ambulatory Visit: Payer: Self-pay

## 2023-09-29 DIAGNOSIS — C7951 Secondary malignant neoplasm of bone: Secondary | ICD-10-CM

## 2023-09-29 DIAGNOSIS — C61 Malignant neoplasm of prostate: Secondary | ICD-10-CM

## 2023-09-29 DIAGNOSIS — M898X9 Other specified disorders of bone, unspecified site: Secondary | ICD-10-CM

## 2023-09-29 MED ORDER — MORPHINE SULFATE ER 15 MG PO TBCR: 15.0000 mg | EXTENDED_RELEASE_TABLET | Freq: Two times a day (BID) | ORAL | 0 refills | Status: DC

## 2023-09-30 ENCOUNTER — Encounter: Payer: Self-pay | Admitting: Oncology

## 2023-09-30 ENCOUNTER — Other Ambulatory Visit: Payer: Self-pay

## 2023-09-30 DIAGNOSIS — R21 Rash and other nonspecific skin eruption: Secondary | ICD-10-CM

## 2023-09-30 MED ORDER — TRIAMCINOLONE ACETONIDE 0.025 % EX CREA: 1.0000 | TOPICAL_CREAM | Freq: Two times a day (BID) | CUTANEOUS | 5 refills | Status: AC

## 2023-10-04 ENCOUNTER — Encounter: Payer: Self-pay | Admitting: Oncology

## 2023-10-10 ENCOUNTER — Encounter: Payer: Self-pay | Admitting: Oncology

## 2023-10-12 NOTE — Progress Notes (Signed)
 [facility]  [address] [place],  [place]  [address] [phone]    Clinic Day:  10/14/23   Referring physician: Paniagua, Tiziana, MD  ASSESSMENT & PLAN:  Assessment: Malignant neoplasm of prostate [facility]) Stage IVA prostate cancer diagnosed in January 2016.  He had progressive disease in the bone and lung in September 2023, so was placed on docetaxel  chemotherapy and continued leuprolide  and zoledronic  acid.  His PSA went down from 536 to 400.75 after 3 cycles, then to 284 with the 5th cycle, and then 203 in March 2024. PSA then went up to 261 in May. We decided to give him a break from chemotherapy at that time.  We continued leuprolide  and zolendronic acid every 3 months. The PSA was 1156. PSMA PET in September 2024 revealed progression of skeletal metastasis. Multiple new radiotracer avid lesions within the spine. Individual lesions are increased in size compared to prior accompanied by sclerotic CT change. 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 No evidence of nodal metastasis or visceral metastasis. He is receiving palliative chemotherapy with cabazitaxel /prednisone . We will stop his chemotherapy at this time due to significantly rising PSA. PSA was 426 in early April. His last PSA in June increased from 822.82 to 1,013.69 within a month, and then to 1,061 one month later.     Secondary malignant neoplasm of bone and bone marrow (HCC) He continues zoledronic  acid every 3 months in addition to his cancer therapy.  He is currently receiving chemotherapy with cabazitaxel /prednisone .  His last dose of zoledronic  acid was on June, 13th so he will be due again in early September. He continues to deny any significant pain.   Abnormal radiologic findings on diagnostic imaging of renal pelvis, ureter, or bladder CTA abdomen and CT urogram were done in the ER at [facility] last month to evaluate hematuria.  These revealed increased bilateral ureteral mucosal  enhancement with periureteral fat stranding, which may reflect underlying urinary tract infection.. No evidence of pyelonephritis. Mild bilateral hydroureter, left greater than right, with a possible enhancing 3 mm lesion within the mid right ureter. No urinary tract calculi. Limited evaluation of the bladder due to decompressed state. Possible asymmetric right inferolateral bladder wall thickening. Cystoscopy was negative by Dr. Lyell, urology at [facility]. She performed cystoscopy, bilateral retrograde pyelogram, bilateral ureteroscopy and with ureteral stent placement in May.  Evaluation was negative.  The ureteral stent was removed.   Anemia He had an upper GI hemorrhage earlier this year and was referred to Dr. Larene. Fortunately, he just had the one episode and was transfused. His hemoglobin came up to 11.1 and has remained stable. He had a EGD scheduled in April, but it was canceled. His cardiologist Dr. Court was not comfortable with him stopping clopidogrel  for another year. His bleeding has stopped and so we will not pursue an EGD at this time unless he begins having melena again and/or a significant drop in his hemoglobin.  He did have some blood loss with hematuria in the spring but his hemoglobin did come up to 12.4 now.   Plan: He does have chronic pain that is managed with 12 hour time-release 15mg  oral morphine  and Tylenol  for breakthrough pain. He states that this is sufficient and he continues this medication without complication. He states that his fatigue is still present, but has improved since his last visit. He has a WBC of 9.7, hemoglobin of 12.4, and platelet count of 245,000. His CMP is normal other than an elevated  alkaline phosphatase of 130, up from 82. His PSA from last time had increased from 1013 to 1061. It is pending from today. He has a PSMA PET scan scheduled for 11/17/2023. I will see him back in 5-6 weeks with CBC, CMP, and PSA. He will be due for his 3 month  injections of Lupron  and Zometa . I informed him that I did look into Pluvicto, which we discussed last visit. Although I cannot give this treatment here in the Cancer Center, I informed him that he can receive this at [facility]. One concern is that it can cause cytopenias. I do recommend this treatment for him since his blood counts have done well and we have exhausted most other options. The patient understands and agrees with this plan of care. He knows to call if he has concerns sooner.  I provided 18 minutes of face-to-face time during this encounter and > 50% was spent counseling as documented under my assessment and plan.   Marvin VEAR Cornish, MD  [facility] A DEPT OF [facility] [address] [place] [address] Dept: [phone] Dept Fax: [phone]   No orders of the defined types were placed in this encounter.   CHIEF COMPLAINT:  CC: Stage IVA prostate cancer  Current Treatment: leuprolide  and zoledronic  acid  HISTORY OF PRESENT ILLNESS:   Oncology History  Malignant neoplasm of prostate (HCC)  03/30/2014 Cancer Staging   Staging form: Prostate, AJCC 8th Edition - Clinical stage from 03/30/2014: Stage IVA (cT3b, cN1, cM0, PSA: 51.8, Grade Group: 4) - Signed by Johnson Marvin VEAR, MD on 11/14/2020 Histopathologic type: Adenocarcinoma, NOS Stage prefix: Initial diagnosis Prostate specific antigen (PSA) range: 20 or greater Gleason primary pattern: 4 Gleason secondary pattern: 4 Gleason score: 8 Histologic grading system: 5 grade system Location of positive needle core biopsies: Beyond primary site Prognostic indicators: May 2016 Robotic prostatectomy with mult. Pos margins, extra prostatic extension, 2/6 pos nodes. Scan June 2016 positive at T10  PSA up to 216.3 by August Placed on lupron  and casodex Stage used in treatment planning: Yes National guidelines used in treatment planning:  Yes Type of national guideline used in treatment planning: NCCN   02/18/2020 Initial Diagnosis   Malignant neoplasm of prostate (HCC)   12/01/2021 - 07/19/2022 Chemotherapy   Patient is on Treatment Plan : PROSTATE Docetaxel  (75) + Prednisone  q21d     12/21/2022 - 08/26/2023 Chemotherapy   Patient is on Treatment Plan : PROSTATE Cabazitaxel  (20) D1 + Prednisone  D1-21 q21d     Secondary malignant neoplasm of bone and bone marrow (HCC)  02/18/2020 Initial Diagnosis   Secondary malignant neoplasm of bone and bone marrow (HCC)   12/01/2021 - 07/19/2022 Chemotherapy   Patient is on Treatment Plan : PROSTATE Docetaxel  (75) + Prednisone  q21d     12/21/2022 - 08/26/2023 Chemotherapy   Patient is on Treatment Plan : PROSTATE Cabazitaxel  (20) D1 + Prednisone  D1-21 q21d     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 Patient states that he feels good but does have chronic pain that is managed with 12 hour  time-release 15 mg oral morphine  and Tylenol  for breakthrough pain. He states that this is sufficient and he continues this medication without complication. He states that his fatigue is still present, but has improved since his last visit. He has a WBC of 9.7, hemoglobin of 12.4, and platelet count of 245,000. His CMP is normal other than an elevated alkaline phosphatase of 130, up from 82. His PSA from last time had increased from 1013 to 1061. It is pending from today. He has a PSMA PET scan scheduled for 11/17/2023. I will see him back in 5-6 weeks with CBC, CMP, and PSA. He will be due for his 3 month injections of Lupron  and Zometa . I informed him that I did look into  Pluvicto, which we discussed last visit. Although I cannot give this treatment here in the Cancer Center, I informed him that he can receive this at [facility]. One concern is that it can cause cytopenias. I do recommend this treatment for him since his blood counts have done well and we have exhausted most other options. The patient agrees.   He denies fever, chills, night sweats, or other signs of infection. He denies cardiorespiratory and gastrointestinal issues. His appetite is good, he has 1 ensure a day, and His weight has decreased 3 pounds over last 4 weeks.   REVIEW OF SYSTEMS:   Review of Systems  Constitutional:  Positive for fatigue. Negative for appetite change, chills, diaphoresis, fever and unexpected weight change.  HENT:  Negative.  Negative for lump/mass, mouth sores, nosebleeds and sore throat.   Eyes: Negative.   Respiratory: Negative.  Negative for chest tightness, cough, hemoptysis, shortness of breath and wheezing.   Cardiovascular: Negative.  Negative for chest pain, leg swelling and palpitations.  Gastrointestinal: Negative.  Negative for abdominal distention, abdominal pain, blood in stool, constipation, diarrhea, nausea and vomiting.  Endocrine: Negative.  Negative for hot flashes.  Genitourinary: Negative.  Negative for difficulty urinating, dysuria, frequency and hematuria.   Musculoskeletal:  Positive for arthralgias (chronic) and back pain (chronic). Negative for flank pain, gait problem, myalgias and neck pain.       Pain all over, as usual  Skin: Negative.  Negative for itching, rash and wound.  Neurological: Negative.  Negative for dizziness, extremity weakness, gait problem, headaches, light-headedness, numbness, seizures and speech difficulty.  Hematological: Negative.  Negative for adenopathy. Does not bruise/bleed easily.  Psychiatric/Behavioral: Negative.  Negative for depression and sleep disturbance. The patient is not nervous/anxious.       VITALS:   Blood pressure 128/75, pulse 79, temperature 97.9 F (36.6 C), temperature source Oral, resp. rate 18, height 5' 7 (1.702 m), weight 177 lb 8 oz (80.5 kg), SpO2 97%.  Wt Readings from Last 3 Encounters:  10/14/23 177 lb 8 oz (80.5 kg)  09/14/23 180 lb 14.4 oz (82.1 kg)  08/24/23 180 lb 6.4 oz (81.8 kg)    Body mass index is 27.8 kg/m.  Performance status (ECOG): 1 - Symptomatic but completely ambulatory    PHYSICAL EXAM:   Physical Exam Vitals and nursing note reviewed.  Constitutional:      General: He is not in acute distress.    Appearance: Normal appearance. He is normal weight.  HENT:     Head: Normocephalic and atraumatic.     Right Ear: Tympanic membrane, ear canal and external ear normal.     Left Ear: Tympanic membrane, ear canal and external ear normal.     Nose: Nose normal.  Mouth/Throat:     Mouth: Mucous membranes are moist.     Pharynx: Oropharynx is clear. No oropharyngeal exudate or posterior oropharyngeal erythema.  Eyes:     General: No scleral icterus.    Extraocular Movements: Extraocular movements intact.     Conjunctiva/sclera: Conjunctivae normal.     Pupils: Pupils are equal, round, and reactive to light.  Cardiovascular:     Rate and Rhythm: Normal rate and regular rhythm.     Pulses: Normal pulses.     Heart sounds: Normal heart sounds. No murmur heard.    No friction rub. No gallop.  Pulmonary:     Effort: Pulmonary effort is normal.     Breath sounds: Normal breath sounds. No wheezing, rhonchi or rales.  Abdominal:     General: Bowel sounds are normal. There is no distension.     Palpations: Abdomen is soft. There is no hepatomegaly, splenomegaly or mass.     Tenderness: There is no abdominal tenderness.  Musculoskeletal:        General: Normal range of motion.     Cervical back: Normal range of motion and neck supple. No tenderness.     Right lower leg: No edema.     Left lower leg: No edema.  Lymphadenopathy:      Cervical: No cervical adenopathy.     Upper Body:     Right upper body: No supraclavicular or axillary adenopathy.     Left upper body: No supraclavicular or axillary adenopathy.     Lower Body: No right inguinal adenopathy. No left inguinal adenopathy.  Skin:    General: Skin is warm and dry.     Coloration: Skin is not jaundiced.     Findings: No rash.  Neurological:     General: No focal deficit present.     Mental Status: He is alert and oriented to person, place, and time. Mental status is at baseline.     Cranial Nerves: No cranial nerve deficit.  Psychiatric:        Mood and Affect: Mood normal.        Behavior: Behavior normal.        Thought Content: Thought content normal.        Judgment: Judgment normal.     LABS:      Latest Ref Rng & Units 10/14/2023    8:51 AM 09/14/2023    8:53 AM 08/24/2023    9:38 AM  CBC  WBC 4.0 - 10.5 K/uL 9.7  9.2  9.3   Hemoglobin 13.0 - 17.0 g/dL 87.5  88.8  87.9   Hematocrit 39.0 - 52.0 % 39.1  36.3  38.9   Platelets 150 - 400 K/uL 245  202  231       Latest Ref Rng & Units 10/14/2023    8:51 AM 09/14/2023    8:53 AM 08/24/2023    9:38 AM  CMP  Glucose 70 - 99 mg/dL 837  859  841   BUN 8 - 23 mg/dL 14  19  13    Creatinine 0.61 - 1.24 mg/dL 9.17  9.13  9.13   Sodium 135 - 145 mmol/L 140  139  139   Potassium 3.5 - 5.1 mmol/L 3.8  3.5  3.8   Chloride 98 - 111 mmol/L 101  103  102   CO2 22 - 32 mmol/L 24  24  22    Calcium 8.9 - 10.3 mg/dL 9.6  9.2  9.7   Total Protein 6.5 -  8.1 g/dL 7.1  6.6  6.8   Total Bilirubin 0.0 - 1.2 mg/dL 0.4  0.3  0.3   Alkaline Phos 38 - 126 U/L 130  82  101   AST 15 - 41 U/L 34  24  27   ALT 0 - 44 U/L 18  16  13     Component Ref Range & Units (hover) 2 wk ago (08/24/23) 1 mo ago (07/27/23) 2 mo ago (07/06/23) 3 mo ago (06/15/23) 3 mo ago (05/25/23) 4 mo ago (05/04/23) 5 mo ago (04/13/23)  Prostatic Specific Antigen 1,013.69 High  822.82 High  CM 438.43 High  CM 426.49 High  CM 371.57 High  CM 542.00 High   CM 423.96 High      Lab Results  Component Value Date   PSA1 1,362.0 (H) 02/01/2023   PSA1 1,317.0 (H) 01/06/2023   PSA1 334.0 (H) 10/30/2021   Lab Results  Component Value Date   TIBC 398 01/29/2022   FERRITIN 206 01/29/2022   IRONPCTSAT 25 01/29/2022   STUDIES:   Exam: 06/01/2023 CT ABDOMEN AND PELVIS WITHOUT CONTRAST IMPRESSIONS: Left-sided hyfrouteronephrosis which was not previously present. Wuestionable soft tissue lesion in the distal ureter on the left. There may be a mild amount of stranding around the left kidney. There is some mild stranding around the right ureter of uncertain significance as well. Correlate clinically/with laboratory findings. These findings were not previously present. A CT urogram may provide additional information.     HISTORY:   Past Medical History:  Diagnosis Date   Hyperlipidemia    Hypokalemia 03/24/2023   Malignant neoplasm of prostate (HCC)    Supraventricular tachycardia (HCC)     Past Surgical History:  Procedure Laterality Date   CORONARY STENT INTERVENTION N/A 02/08/2022   Procedure: CORONARY STENT INTERVENTION;  Surgeon: Mady Bruckner, MD;  Location: [facility] INVASIVE CV LAB;  Service: Cardiovascular;  Laterality: N/A;   LEFT HEART CATH AND CORONARY ANGIOGRAPHY N/A 02/08/2022   Procedure: LEFT HEART CATH AND CORONARY ANGIOGRAPHY;  Surgeon: Mady Bruckner, MD;  Location: [facility] INVASIVE CV LAB;  Service: Cardiovascular;  Laterality: N/A;   PROSTATECTOMY  07/2014   TONSILLECTOMY      Family History  Problem Relation Age of Onset   Breast cancer Mother 22   Ovarian cancer Mother 33   Kidney Stones Sister    Breast cancer Paternal Grandmother 68    Social History:  reports that he has never smoked. He has never used smokeless tobacco. He reports that he does not currently use alcohol. He reports that he does not use drugs.The patient is alone today.  Allergies:  Allergies  Allergen Reactions   Atorvastatin    Rosuvastatin  Other (See Comments)    Other reaction(s): Muscle pain   Simvastatin Other (See Comments)    Other reaction(s): Joint pain, Muscle pain   Sulfa Antibiotics    Sulfamethoxazole-Trimethoprim Other (See Comments)    Unknown as to interaction was a baby.    Current Medications: Current Outpatient Medications  Medication Sig Dispense Refill   Alirocumab 75 MG/ML SOAJ Inject 75 mg into the skin.     Cholecalciferol 25 MCG (1000 UT) tablet Take 1,000 Units by mouth in the morning and at bedtime.     clopidogrel  (PLAVIX ) 75 MG tablet Take 75 mg by mouth daily.     diphenhydrAMINE  (BENADRYL ) 25 mg capsule Take 1 capsule by mouth at bedtime.     diphenhydrAMINE  (BENADRYL ) 50 MG capsule Take 50 mg by mouth at  bedtime as needed.     ezetimibe  (ZETIA ) 10 MG tablet Take 10 mg by mouth daily.     fluconazole  (DIFLUCAN ) 200 MG tablet Take 1 tablet (200 mg total) by mouth daily. 10 tablet 0   guaiFENesin-codeine 100-10 MG/5ML syrup Take 5-10 mLs by mouth every 4 (four) hours as needed.     isosorbide mononitrate (IMDUR) 30 MG 24 hr tablet Take 1 tablet by mouth every 8 (eight) hours as needed.     leuprolide  (LUPRON ) 22.5 MG injection Inject 22.5 mg into the muscle every 3 (three) months.     metoprolol  tartrate (LOPRESSOR ) 25 MG tablet Take 0.5 tablets (12.5 mg total) by mouth 2 (two) times daily. 120 tablet 1   morphine  (MS CONTIN ) 15 MG 12 hr tablet Take 1 tablet (15 mg total) by mouth 2 (two) times daily. 60 tablet 0   Multiple Vitamins-Minerals (MULTIVITAMIN WITH MINERALS) tablet Take 1 tablet by mouth daily.     nitroGLYCERIN  (NITROSTAT ) 0.4 MG SL tablet Place 0.4 mg under the tongue every 5 (five) minutes as needed for chest pain. Repeat every 5 minutes if needed for a total of 3 tablets in 15 minutes. If no relief, CALL 911.     Omega-3 Fatty Acids (FISH OIL) 1000 MG CAPS Take 2,000 mg by mouth in the morning and at bedtime.     ondansetron  (ZOFRAN ) 8 MG tablet Take 8 mg by mouth every 8 (eight)  hours as needed for nausea or vomiting.     polyethylene glycol powder (GLYCOLAX/MIRALAX) 17 GM/SCOOP powder Take 1 Container by mouth once.     potassium chloride  (KLOR-CON  M) 10 MEQ tablet TAKE 1 TABLET(10 MEQ) BY MOUTH TWICE DAILY 180 tablet 3   predniSONE  (DELTASONE ) 5 MG tablet Take 1 tablet (5 mg total) by mouth 2 (two) times daily with a meal. 60 tablet 5   prochlorperazine  (COMPAZINE ) 10 MG tablet Take 1 tablet (10 mg total) by mouth every 6 (six) hours as needed for nausea or vomiting. 30 tablet 2   Royal Jelly-Bee Pollen-Ginseng (KOREAN GINSENG COMPLEX) 150-250-50 MG CAPS Take 1 capsule by mouth daily.     triamcinolone  (KENALOG ) 0.025 % cream Apply 1 Application topically 2 (two) times daily. 30 g 5   Zoledronic  Acid (ZOMETA ) 4 MG/100ML IVPB Inject 4 mg into the vein every 3 (three) months.     No current facility-administered medications for this visit.   Facility-Administered Medications Ordered in Other Visits  Medication Dose Route Frequency Provider Last Rate Last Admin   sodium chloride  flush (NS) 0.9 % injection 10 mL  10 mL Intravenous PRN Cornelius Marvin DEL, MD   10 mL at 03/17/23 0953   sodium chloride  flush (NS) 0.9 % injection 10 mL  10 mL Intracatheter PRN Cornelius Marvin DEL, MD   10 mL at 06/15/23 1234   sodium chloride  flush (NS) 0.9 % injection 10 mL  10 mL Intravenous PRN Cornelius Marvin DEL, MD   10 mL at 07/06/23 9071   sodium chloride  flush (NS) 0.9 % injection 10 mL  10 mL Intravenous PRN Cornelius Marvin DEL, MD   10 mL at 07/27/23 1114   sodium chloride  flush (NS) 0.9 % injection 10 mL  10 mL Intracatheter PRN Cornelius Marvin DEL, MD   10 mL at 09/14/23 9046    I,Tera Pellicane H Bonna Steury,acting as a scribe for Marvin DEL Cornelius, MD.,have documented all relevant documentation on the behalf of Marvin DEL Cornelius, MD,as directed by  Marvin DEL Cornelius, MD while  in the presence of Marvin VEAR Cornish, MD.

## 2023-10-14 ENCOUNTER — Inpatient Hospital Stay: Attending: Oncology | Admitting: Oncology

## 2023-10-14 ENCOUNTER — Encounter: Payer: Self-pay | Admitting: Oncology

## 2023-10-14 ENCOUNTER — Inpatient Hospital Stay

## 2023-10-14 ENCOUNTER — Other Ambulatory Visit: Payer: Self-pay | Admitting: Oncology

## 2023-10-14 VITALS — BP 128/75 | HR 79 | Temp 97.9°F | Resp 18 | Ht 67.0 in | Wt 177.5 lb

## 2023-10-14 DIAGNOSIS — C7800 Secondary malignant neoplasm of unspecified lung: Secondary | ICD-10-CM | POA: Diagnosis not present

## 2023-10-14 DIAGNOSIS — C61 Malignant neoplasm of prostate: Secondary | ICD-10-CM

## 2023-10-14 DIAGNOSIS — Z7952 Long term (current) use of systemic steroids: Secondary | ICD-10-CM | POA: Insufficient documentation

## 2023-10-14 DIAGNOSIS — D649 Anemia, unspecified: Secondary | ICD-10-CM | POA: Insufficient documentation

## 2023-10-14 DIAGNOSIS — I471 Supraventricular tachycardia, unspecified: Secondary | ICD-10-CM | POA: Insufficient documentation

## 2023-10-14 DIAGNOSIS — Z9221 Personal history of antineoplastic chemotherapy: Secondary | ICD-10-CM | POA: Diagnosis not present

## 2023-10-14 DIAGNOSIS — R5383 Other fatigue: Secondary | ICD-10-CM | POA: Insufficient documentation

## 2023-10-14 DIAGNOSIS — G8929 Other chronic pain: Secondary | ICD-10-CM | POA: Diagnosis not present

## 2023-10-14 DIAGNOSIS — Z79899 Other long term (current) drug therapy: Secondary | ICD-10-CM | POA: Insufficient documentation

## 2023-10-14 DIAGNOSIS — R319 Hematuria, unspecified: Secondary | ICD-10-CM | POA: Insufficient documentation

## 2023-10-14 DIAGNOSIS — C7952 Secondary malignant neoplasm of bone marrow: Secondary | ICD-10-CM

## 2023-10-14 DIAGNOSIS — E785 Hyperlipidemia, unspecified: Secondary | ICD-10-CM | POA: Diagnosis not present

## 2023-10-14 DIAGNOSIS — Z7902 Long term (current) use of antithrombotics/antiplatelets: Secondary | ICD-10-CM | POA: Insufficient documentation

## 2023-10-14 DIAGNOSIS — E876 Hypokalemia: Secondary | ICD-10-CM | POA: Insufficient documentation

## 2023-10-14 DIAGNOSIS — C7951 Secondary malignant neoplasm of bone: Secondary | ICD-10-CM | POA: Insufficient documentation

## 2023-10-14 DIAGNOSIS — R748 Abnormal levels of other serum enzymes: Secondary | ICD-10-CM | POA: Insufficient documentation

## 2023-10-14 LAB — CMP (CANCER CENTER ONLY)
ALT: 18 U/L (ref 0–44)
AST: 34 U/L (ref 15–41)
Albumin: 4.1 g/dL (ref 3.5–5.0)
Alkaline Phosphatase: 130 U/L — ABNORMAL HIGH (ref 38–126)
Anion gap: 16 — ABNORMAL HIGH (ref 5–15)
BUN: 14 mg/dL (ref 8–23)
CO2: 24 mmol/L (ref 22–32)
Calcium: 9.6 mg/dL (ref 8.9–10.3)
Chloride: 101 mmol/L (ref 98–111)
Creatinine: 0.82 mg/dL (ref 0.61–1.24)
GFR, Estimated: >60 mL/min (ref >60)
Glucose, Bld: 162 mg/dL — ABNORMAL HIGH (ref 70–99)
Potassium: 3.8 mmol/L (ref 3.5–5.1)
Sodium: 140 mmol/L (ref 135–145)
Total Bilirubin: 0.4 mg/dL (ref 0.0–1.2)
Total Protein: 7.1 g/dL (ref 6.5–8.1)

## 2023-10-14 LAB — CBC WITH DIFFERENTIAL (CANCER CENTER ONLY)
Abs Immature Granulocytes: 0.04 K/uL (ref 0.00–0.07)
Basophils Absolute: 0 K/uL (ref 0.0–0.1)
Basophils Relative: 0 %
Eosinophils Absolute: 0.1 K/uL (ref 0.0–0.5)
Eosinophils Relative: 1 %
HCT: 39.1 % (ref 39.0–52.0)
Hemoglobin: 12.4 g/dL — ABNORMAL LOW (ref 13.0–17.0)
Immature Granulocytes: 0 %
Lymphocytes Relative: 30 %
Lymphs Abs: 2.9 K/uL (ref 0.7–4.0)
MCH: 28.2 pg (ref 26.0–34.0)
MCHC: 31.7 g/dL (ref 30.0–36.0)
MCV: 88.9 fL (ref 80.0–100.0)
Monocytes Absolute: 0.5 K/uL (ref 0.1–1.0)
Monocytes Relative: 5 %
Neutro Abs: 6.2 K/uL (ref 1.7–7.7)
Neutrophils Relative %: 64 %
Platelet Count: 245 K/uL (ref 150–400)
RBC: 4.4 MIL/uL (ref 4.22–5.81)
RDW: 15.2 % (ref 11.5–15.5)
WBC Count: 9.7 K/uL (ref 4.0–10.5)
nRBC: 0 % (ref 0.0–0.2)

## 2023-10-14 LAB — PSA: Prostatic Specific Antigen: >1500 ng/mL — ABNORMAL HIGH (ref 0.00–4.00)

## 2023-10-26 ENCOUNTER — Ambulatory Visit (HOSPITAL_COMMUNITY)
Admission: RE | Admit: 2023-10-26 | Discharge: 2023-10-26 | Disposition: A | Discharge status: Home / Self Care | Source: Ambulatory Visit | Attending: Oncology | Admitting: Oncology

## 2023-10-26 DIAGNOSIS — C61 Malignant neoplasm of prostate: Secondary | ICD-10-CM

## 2023-10-26 DIAGNOSIS — C7800 Secondary malignant neoplasm of unspecified lung: Secondary | ICD-10-CM | POA: Insufficient documentation

## 2023-10-26 MED ORDER — FLOTUFOLASTAT F 18 GALLIUM 296-5846 MBQ/ML IV SOLN
8.4100 | Freq: Once | INTRAVENOUS | Status: AC
  Administered 2023-10-26 (×2): 8.41 via INTRAVENOUS

## 2023-10-27 ENCOUNTER — Encounter: Payer: Self-pay | Admitting: Oncology

## 2023-11-02 ENCOUNTER — Other Ambulatory Visit: Payer: Self-pay

## 2023-11-02 DIAGNOSIS — M898X9 Other specified disorders of bone, unspecified site: Secondary | ICD-10-CM

## 2023-11-02 DIAGNOSIS — C61 Malignant neoplasm of prostate: Secondary | ICD-10-CM

## 2023-11-02 DIAGNOSIS — C7951 Secondary malignant neoplasm of bone: Secondary | ICD-10-CM

## 2023-11-02 MED ORDER — MORPHINE SULFATE ER 15 MG PO TBCR: 15.0000 mg | EXTENDED_RELEASE_TABLET | Freq: Two times a day (BID) | ORAL | 0 refills | Status: DC

## 2023-11-02 NOTE — Progress Notes (Signed)
 [facility]  [address] [place],  [place]  [address] [phone]    Clinic Day:  11/23/23    Referring physician: Paniagua, Tiziana, MD  ASSESSMENT & PLAN:  Assessment: Malignant neoplasm of prostate [facility]) Stage IVA prostate cancer diagnosed in January 2016.  He had progressive disease in the bone and lung in September 2023, so was placed on docetaxel  chemotherapy and continued leuprolide  and zoledronic  acid.  His PSA went down from 536 to 400.75 after 3 cycles, then to 284 with the 5th cycle, and then 203 in March 2024. PSA then went up to 261 in May. We decided to give him a break from chemotherapy at that time.  We continued leuprolide  and zolendronic acid every 3 months. The PSA was 1156. PSMA PET in September 2024 revealed progression of skeletal metastasis. Multiple new radiotracer avid lesions within the spine. Individual lesions are increased in size compared to prior accompanied by sclerotic CT change. 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 No evidence of nodal metastasis or visceral metastasis. He was receiving palliative chemotherapy with cabazitaxel /prednisone  until June, 2025. It was then stopped due to significantly rising PSA. PSA was 426 in early April. His last PSA in June increased from 822.82 to 1,013.69 within a month, and then to 1,061 one month later, now over 1,500 as of August. PET scan done on 10/26/2023 which revealed his activity within the multiple skeletal lesions is slightly decreased; however, the individual lesions are slightly expanded in radiotracer activity tissue involvement. No new foci of disease, no lymphadenopathy or visceral metastasis, and the overall minimal interval change in widespread metastatic prostate skeletal disease. We will continue Lupron  injections and consider addition of Ketoconazole  since he has been through almost everything else, except for Pluvicto. We will consider this when he becomes symptomatic.      Secondary malignant neoplasm of bone and bone marrow (HCC) He continues zoledronic  acid every 3 months in addition to his cancer therapy.  We have stopped chemotherapy with cabazitaxel /prednisone .  His last dose of zoledronic  acid was on June, 13th so he will be due again in early September. He continues to deny any significant pain.   Abnormal radiologic findings on diagnostic imaging of renal pelvis, ureter, or bladder CTA abdomen and CT urogram were done in the ER at [facility] last month to evaluate hematuria.  These revealed increased bilateral ureteral mucosal enhancement with periureteral fat stranding, which may reflect underlying urinary tract infection.. No evidence of pyelonephritis. Mild bilateral hydroureter, left greater than right, with a possible enhancing 3 mm lesion within the mid right ureter. No urinary tract calculi. Limited evaluation of the bladder due to decompressed state. Possible asymmetric right inferolateral bladder wall thickening. Cystoscopy was negative by Dr. Lyell, urology at [facility]. She performed cystoscopy, bilateral retrograde pyelogram, bilateral ureteroscopy and with ureteral stent placement in May.  Evaluation was negative.  The ureteral stent was removed.   Anemia He had an upper GI hemorrhage earlier this year and was referred to Dr. Larene. Fortunately, he just had the one episode and was transfused. His hemoglobin came up to 11.1 and has remained stable. He had a EGD scheduled in April, but it was canceled. His cardiologist Dr. Court was not comfortable with him stopping clopidogrel  for another year. His bleeding has stopped and so we will not pursue an EGD at this time unless he begins having melena again and/or a significant drop in his hemoglobin.  He did have some blood loss with hematuria  in the spring but his hemoglobin did come up to 12.4 and is now stable at 12.0.    Plan: He had a PET scan done on 10/26/2023 which revealed his activity within  the multiple skeletal lesions is slightly decreased; however, the individual lesions are slightly expanded in radiotracer activity tissue involvement. No new foci of disease, no lymphadenopathy or visceral metastasis, and the overall minimal interval change in widespread metastatic prostate skeletal disease. This is despite the latest PSA now over 1,500. We discussed continuing his current treatment in view of the potential side effects of Pluvicto. I also informed him of Ketoconazole  200mg  BID to try for a month or two, as this may help with his persistent fungal rash of the upper chest and possibly lower his PSA. He continues Prednisone  5mg  BID without difficulty. I instructed him to decrease this to 5mg  once daily. He has a WBC of 8.2, low hemoglobin of 12.0 down from 12.4, and platelet count of 268,000. His CMP is normal other than an elevated glucose of 182 and alkaline phosphatase of 255. His PSA today is pending and I will call him with the results. He had his last Lupron  injection on June 11th so he will be due for his next one today, along with his Zometa . I will see him back in 1 month with CBC, CMP, and PSA. The patient understands and agrees with this plan of care. He knows to call if he has concerns sooner.  I provided 36 minutes of face-to-face time during this encounter and > 50% was spent counseling as documented under my assessment and plan.   [name], MD  [facility] - A DEPT OF [facility] [address] [place] [address] Dept: [phone] Dept Fax: [phone]   No orders of the defined types were placed in this encounter.   CHIEF COMPLAINT:  CC: Stage IVA prostate cancer  Current Treatment: leuprolide  and zoledronic  acid  HISTORY OF PRESENT ILLNESS:   Oncology History  Malignant neoplasm of prostate (HCC)  03/30/2014 Cancer Staging   Staging form: Prostate, AJCC 8th Edition - Clinical stage from  03/30/2014: Stage IVA (cT3b, cN1, cM0, PSA: 51.8, Grade Group: 4) - Signed by Cornish Wanda VEAR, MD on 11/14/2020 Histopathologic type: Adenocarcinoma, NOS Stage prefix: Initial diagnosis Prostate specific antigen (PSA) range: 20 or greater Gleason primary pattern: 4 Gleason secondary pattern: 4 Gleason score: 8 Histologic grading system: 5 grade system Location of positive needle core biopsies: Beyond primary site Prognostic indicators: May 2016 Robotic prostatectomy with mult. Pos margins, extra prostatic extension, 2/6 pos nodes. Scan June 2016 positive at T10  PSA up to 216.3 by August Placed on lupron  and casodex Stage used in treatment planning: Yes National guidelines used in treatment planning: Yes Type of national guideline used in treatment planning: NCCN   02/18/2020 Initial Diagnosis   Malignant neoplasm of prostate (HCC)   12/01/2021 - 07/19/2022 Chemotherapy   Patient is on Treatment Plan : PROSTATE Docetaxel  (75) + Prednisone  q21d     12/21/2022 - 08/26/2023 Chemotherapy   Patient is on Treatment Plan : PROSTATE Cabazitaxel  (20) D1 + Prednisone  D1-21 q21d     Secondary malignant neoplasm of bone and bone marrow (HCC)  02/18/2020 Initial Diagnosis   Secondary malignant neoplasm of bone and bone marrow (HCC)   12/01/2021 - 07/19/2022 Chemotherapy   Patient is on Treatment Plan : PROSTATE Docetaxel  (75) + Prednisone  q21d     12/21/2022 -  08/26/2023 Chemotherapy   Patient is on Treatment Plan : PROSTATE Cabazitaxel  (20) D1 + Prednisone  D1-21 q21d     Malignant neoplasm of prostate metastatic to lung (HCC)  11/25/2021 Initial Diagnosis   Malignant neoplasm of prostate metastatic to lung [facility])   12/21/2022 - 08/26/2023 Chemotherapy   Patient is on Treatment Plan : PROSTATE Cabazitaxel  (20) D1 + Prednisone  D1-21 q21d       INTERVAL HISTORY:  Marvin Johnson is here today for repeat clinical assessment for his stage IV prostate cancer. Patient states that he feels well but complains of  fatigue and neuropathy aching pain in both of his lower legs. He had a PET scan done on 10/26/2023 which revealed his activity within the multiple skeletal lesions is slightly decreased; however, the individual lesions are slightly expanded in radiotracer activity tissue involvement. No new foci of disease, no lymphadenopathy or visceral metastasis, and the overall minimal interval change in widespread metastatic prostate skeletal disease. This is despite the latest PSA now over 1,500. We discussed continuing his current treatment in view of the potential side effects of Pluvicto. I also informed him of Ketoconazole  200mg  BID to try for a month or two, as this may help with his persistent fungal rash of the upper chest and possibly lower his PSA. He continues Prednisone  5mg  BID without difficulty. I instructed him to decrease this to 5mg  once daily. He has a WBC of 8.2, low hemoglobin of 12.0 down from 12.4, and platelet count of 268,000. His CMP is normal other than an elevated glucose of 182 and alkaline phosphatase of 255. His PSA today is pending and I will call him with the results. He had his last Lupron  injection on June 11th so he will be due for his next one today, along with his Zometa . I will see him back in 1 month with CBC, CMP, and PSA.   He denies fever, chills, night sweats, or other signs of infection. He denies cardiorespiratory and gastrointestinal issues. He  denies pain. His appetite is great and His weight has increased 4 pounds over last month.   REVIEW OF SYSTEMS:   Review of Systems  Constitutional:  Positive for fatigue. Negative for appetite change, chills, diaphoresis, fever and unexpected weight change.  HENT:  Negative.  Negative for lump/mass, mouth sores, nosebleeds and sore throat.   Eyes: Negative.   Respiratory: Negative.  Negative for chest tightness, cough, hemoptysis, shortness of breath and wheezing.   Cardiovascular: Negative.  Negative for chest pain, leg swelling  and palpitations.  Gastrointestinal: Negative.  Negative for abdominal distention, abdominal pain, blood in stool, constipation, diarrhea, nausea and vomiting.  Endocrine: Negative.  Negative for hot flashes.  Genitourinary: Negative.  Negative for difficulty urinating, dysuria, frequency and hematuria.   Musculoskeletal:  Positive for arthralgias (chronic) and back pain (chronic). Negative for flank pain, gait problem, myalgias and neck pain.       Pain all over, as usual  Skin:  Positive for rash (persistent fungal rash of the upper chest). Negative for itching and wound.  Neurological:  Positive for numbness (of both lower legs). Negative for dizziness, extremity weakness, gait problem, headaches, light-headedness, seizures and speech difficulty.  Hematological: Negative.  Negative for adenopathy. Does not bruise/bleed easily.  Psychiatric/Behavioral: Negative.  Negative for depression and sleep disturbance. The patient is not nervous/anxious.      VITALS:   Blood pressure 123/74, pulse 69, temperature 97.7 F (36.5 C), temperature source Oral, resp. rate 18, height 5' 7 (1.702 m),  weight 181 lb 4.8 oz (82.2 kg), SpO2 100%.  Wt Readings from Last 3 Encounters:  11/23/23 181 lb 4.8 oz (82.2 kg)  10/14/23 177 lb 8 oz (80.5 kg)  09/14/23 180 lb 14.4 oz (82.1 kg)    Body mass index is 28.4 kg/m.  Performance status (ECOG): 1 - Symptomatic but completely ambulatory  PHYSICAL EXAM:   Physical Exam Vitals and nursing note reviewed.  Constitutional:      General: He is not in acute distress.    Appearance: Normal appearance. He is normal weight.  HENT:     Head: Normocephalic and atraumatic.     Right Ear: Tympanic membrane, ear canal and external ear normal.     Left Ear: Tympanic membrane, ear canal and external ear normal.     Nose: Nose normal.     Mouth/Throat:     Mouth: Mucous membranes are moist.     Pharynx: Oropharynx is clear. No oropharyngeal exudate or posterior  oropharyngeal erythema.  Eyes:     General: No scleral icterus.    Extraocular Movements: Extraocular movements intact.     Conjunctiva/sclera: Conjunctivae normal.     Pupils: Pupils are equal, round, and reactive to light.  Cardiovascular:     Rate and Rhythm: Normal rate and regular rhythm.     Pulses: Normal pulses.     Heart sounds: Normal heart sounds. No murmur heard.    No friction rub. No gallop.  Pulmonary:     Effort: Pulmonary effort is normal.     Breath sounds: Normal breath sounds. No wheezing, rhonchi or rales.  Abdominal:     General: Bowel sounds are normal. There is no distension.     Palpations: Abdomen is soft. There is no hepatomegaly, splenomegaly or mass.     Tenderness: There is no abdominal tenderness.  Musculoskeletal:        General: Normal range of motion.     Cervical back: Normal range of motion and neck supple. No tenderness.     Right lower leg: No edema.     Left lower leg: No edema.  Lymphadenopathy:     Cervical: No cervical adenopathy.     Upper Body:     Right upper body: No supraclavicular or axillary adenopathy.     Left upper body: No supraclavicular or axillary adenopathy.     Lower Body: No right inguinal adenopathy. No left inguinal adenopathy.  Skin:    General: Skin is warm and dry.     Coloration: Skin is not jaundiced.     Findings: No rash.  Neurological:     General: No focal deficit present.     Mental Status: He is alert and oriented to person, place, and time. Mental status is at baseline.     Cranial Nerves: No cranial nerve deficit.  Psychiatric:        Mood and Affect: Mood normal.        Behavior: Behavior normal.        Thought Content: Thought content normal.        Judgment: Judgment normal.     LABS:      Latest Ref Rng & Units 11/23/2023    8:15 AM 10/14/2023    8:51 AM 09/14/2023    8:53 AM  CBC  WBC 4.0 - 10.5 K/uL 8.2  9.7  9.2   Hemoglobin 13.0 - 17.0 g/dL 87.9  87.5  88.8   Hematocrit 39.0 - 52.0 %  38.5  39.1  36.3   Platelets 150 - 400 K/uL 268  245  202       Latest Ref Rng & Units 11/23/2023    8:15 AM 10/14/2023    8:51 AM 09/14/2023    8:53 AM  CMP  Glucose 70 - 99 mg/dL 817  837  859   BUN 8 - 23 mg/dL 15  14  19    Creatinine 0.61 - 1.24 mg/dL 9.14  9.17  9.13   Sodium 135 - 145 mmol/L 139  140  139   Potassium 3.5 - 5.1 mmol/L 3.6  3.8  3.5   Chloride 98 - 111 mmol/L 100  101  103   CO2 22 - 32 mmol/L 22  24  24    Calcium 8.9 - 10.3 mg/dL 9.3  9.6  9.2   Total Protein 6.5 - 8.1 g/dL 7.0  7.1  6.6   Total Bilirubin 0.0 - 1.2 mg/dL 0.3  0.4  0.3   Alkaline Phos 38 - 126 U/L 255  130  82   AST 15 - 41 U/L 31  34  24   ALT 0 - 44 U/L 20  18  16     Component Ref Range & Units (hover) 2 wk ago (10/14/23) 1 mo ago (09/14/23) 2 mo ago (08/24/23) 3 mo ago (07/27/23) 3 mo ago (07/06/23) 4 mo ago (06/15/23) 5 mo ago (05/25/23)  Prostatic Specific Antigen >1,500.00 High  1,061.05 High  CM 1,013.69 High  CM 822.82 High  CM 438.43 High  CM 426.49 High  CM 371.57 High  CM   Lab Results  Component Value Date   PSA1 1,362.0 (H) 02/01/2023   PSA1 1,317.0 (H) 01/06/2023   PSA1 334.0 (H) 10/30/2021   Lab Results  Component Value Date   TIBC 398 01/29/2022   FERRITIN 206 01/29/2022   IRONPCTSAT 25 01/29/2022   STUDIES:   Exam: 06/01/2023 CT ABDOMEN AND PELVIS WITHOUT CONTRAST IMPRESSIONS: Left-sided hyfrouteronephrosis which was not previously present. Wuestionable soft tissue lesion in the distal ureter on the left. There may be a mild amount of stranding around the left kidney. There is some mild stranding around the right ureter of uncertain significance as well. Correlate clinically/with laboratory findings. These findings were not previously present. A CT urogram may provide additional information.     HISTORY:   Past Medical History:  Diagnosis Date   Hyperlipidemia    Hypokalemia 03/24/2023   Malignant neoplasm of prostate (HCC)    Supraventricular tachycardia (HCC)      Past Surgical History:  Procedure Laterality Date   CORONARY STENT INTERVENTION N/A 02/08/2022   Procedure: CORONARY STENT INTERVENTION;  Surgeon: Mady Bruckner, MD;  Location: [facility] INVASIVE CV LAB;  Service: Cardiovascular;  Laterality: N/A;   LEFT HEART CATH AND CORONARY ANGIOGRAPHY N/A 02/08/2022   Procedure: LEFT HEART CATH AND CORONARY ANGIOGRAPHY;  Surgeon: Mady Bruckner, MD;  Location: [facility] INVASIVE CV LAB;  Service: Cardiovascular;  Laterality: N/A;   PROSTATECTOMY  07/2014   TONSILLECTOMY      Family History  Problem Relation Age of Onset   Breast cancer Mother 66   Ovarian cancer Mother 56   Kidney Stones Sister    Breast cancer Paternal Grandmother 38    Social History:  reports that he has never smoked. He has never used smokeless tobacco. He reports that he does not currently use alcohol. He reports that he does not use drugs.The patient is alone today.  Allergies:  Allergies  Allergen Reactions  Atorvastatin    Rosuvastatin Other (See Comments)    Other reaction(s): Muscle pain   Simvastatin Other (See Comments)    Other reaction(s): Joint pain, Muscle pain   Sulfa Antibiotics    Sulfamethoxazole-Trimethoprim Other (See Comments)    Unknown as to interaction was a baby.    Current Medications: Current Outpatient Medications  Medication Sig Dispense Refill   Alirocumab 75 MG/ML SOAJ Inject 75 mg into the skin.     Cholecalciferol 25 MCG (1000 UT) tablet Take 1,000 Units by mouth in the morning and at bedtime.     clopidogrel  (PLAVIX ) 75 MG tablet Take 75 mg by mouth daily.     diphenhydrAMINE  (BENADRYL ) 25 mg capsule Take 1 capsule by mouth at bedtime.     diphenhydrAMINE  (BENADRYL ) 50 MG capsule Take 50 mg by mouth at bedtime as needed.     ezetimibe  (ZETIA ) 10 MG tablet Take 10 mg by mouth daily.     guaiFENesin-codeine 100-10 MG/5ML syrup Take 5-10 mLs by mouth every 4 (four) hours as needed.     isosorbide mononitrate (IMDUR) 30 MG 24 hr tablet  Take 1 tablet by mouth every 8 (eight) hours as needed.     ketoconazole  (NIZORAL ) 200 MG tablet Take 1 tablet (200 mg total) by mouth 2 (two) times daily. 60 tablet 5   leuprolide  (LUPRON ) 22.5 MG injection Inject 22.5 mg into the muscle every 3 (three) months.     metoprolol  tartrate (LOPRESSOR ) 25 MG tablet Take 0.5 tablets (12.5 mg total) by mouth 2 (two) times daily. 120 tablet 1   morphine  (MS CONTIN ) 15 MG 12 hr tablet Take 1 tablet (15 mg total) by mouth 2 (two) times daily. 60 tablet 0   Multiple Vitamins-Minerals (MULTIVITAMIN WITH MINERALS) tablet Take 1 tablet by mouth daily.     nitroGLYCERIN  (NITROSTAT ) 0.4 MG SL tablet Place 0.4 mg under the tongue every 5 (five) minutes as needed for chest pain. Repeat every 5 minutes if needed for a total of 3 tablets in 15 minutes. If no relief, CALL 911.     Omega-3 Fatty Acids (FISH OIL) 1000 MG CAPS Take 2,000 mg by mouth in the morning and at bedtime.     ondansetron  (ZOFRAN ) 8 MG tablet Take 8 mg by mouth every 8 (eight) hours as needed for nausea or vomiting.     polyethylene glycol powder (GLYCOLAX/MIRALAX) 17 GM/SCOOP powder Take 1 Container by mouth once.     potassium chloride  (KLOR-CON  M) 10 MEQ tablet TAKE 1 TABLET(10 MEQ) BY MOUTH TWICE DAILY 180 tablet 3   predniSONE  (DELTASONE ) 5 MG tablet Take 1 tablet (5 mg total) by mouth 2 (two) times daily with a meal. 60 tablet 5   prochlorperazine  (COMPAZINE ) 10 MG tablet Take 1 tablet (10 mg total) by mouth every 6 (six) hours as needed for nausea or vomiting. 30 tablet 2   Royal Jelly-Bee Pollen-Ginseng (KOREAN GINSENG COMPLEX) 150-250-50 MG CAPS Take 1 capsule by mouth daily.     triamcinolone  (KENALOG ) 0.025 % cream Apply 1 Application topically 2 (two) times daily. 30 g 5   Zoledronic  Acid (ZOMETA ) 4 MG/100ML IVPB Inject 4 mg into the vein every 3 (three) months.     No current facility-administered medications for this visit.   Facility-Administered Medications Ordered in Other Visits   Medication Dose Route Frequency Provider Last Rate Last Admin   sodium chloride  flush (NS) 0.9 % injection 10 mL  10 mL Intravenous PRN Cornelius Wanda DEL, MD  10 mL at 03/17/23 0953   sodium chloride  flush (NS) 0.9 % injection 10 mL  10 mL Intracatheter PRN Cornelius Wanda DEL, MD   10 mL at 06/15/23 1234   sodium chloride  flush (NS) 0.9 % injection 10 mL  10 mL Intravenous PRN Cornelius Wanda DEL, MD   10 mL at 07/06/23 9071   sodium chloride  flush (NS) 0.9 % injection 10 mL  10 mL Intravenous PRN Cornelius Wanda DEL, MD   10 mL at 07/27/23 1114   sodium chloride  flush (NS) 0.9 % injection 10 mL  10 mL Intracatheter PRN Cornelius Wanda DEL, MD   10 mL at 09/14/23 9046    I,Jasmine M Lassiter,acting as a scribe for Wanda DEL Cornelius, MD.,have documented all relevant documentation on the behalf of Wanda DEL Cornelius, MD,as directed by  Wanda DEL Cornelius, MD while in the presence of Wanda DEL Cornelius, MD.

## 2023-11-04 ENCOUNTER — Other Ambulatory Visit: Payer: Self-pay

## 2023-11-04 DIAGNOSIS — M898X9 Other specified disorders of bone, unspecified site: Secondary | ICD-10-CM

## 2023-11-04 DIAGNOSIS — C61 Malignant neoplasm of prostate: Secondary | ICD-10-CM

## 2023-11-04 DIAGNOSIS — C7951 Secondary malignant neoplasm of bone: Secondary | ICD-10-CM

## 2023-11-04 MED ORDER — MORPHINE SULFATE ER 15 MG PO TBCR: 15.0000 mg | EXTENDED_RELEASE_TABLET | Freq: Two times a day (BID) | ORAL | 0 refills | Status: DC

## 2023-11-23 ENCOUNTER — Telehealth: Payer: Self-pay | Admitting: Oncology

## 2023-11-23 ENCOUNTER — Other Ambulatory Visit: Payer: Self-pay | Admitting: Oncology

## 2023-11-23 ENCOUNTER — Encounter: Payer: Self-pay | Admitting: Oncology

## 2023-11-23 ENCOUNTER — Inpatient Hospital Stay

## 2023-11-23 ENCOUNTER — Inpatient Hospital Stay: Attending: Oncology

## 2023-11-23 ENCOUNTER — Inpatient Hospital Stay (HOSPITAL_BASED_OUTPATIENT_CLINIC_OR_DEPARTMENT_OTHER): Admitting: Oncology

## 2023-11-23 ENCOUNTER — Other Ambulatory Visit: Payer: Self-pay | Admitting: Pharmacist

## 2023-11-23 VITALS — BP 130/80 | HR 74 | Resp 18 | Ht 67.0 in

## 2023-11-23 VITALS — BP 123/74 | HR 69 | Temp 97.7°F | Resp 18 | Ht 67.0 in | Wt 181.3 lb

## 2023-11-23 DIAGNOSIS — C7951 Secondary malignant neoplasm of bone: Secondary | ICD-10-CM | POA: Diagnosis not present

## 2023-11-23 DIAGNOSIS — Z7952 Long term (current) use of systemic steroids: Secondary | ICD-10-CM | POA: Diagnosis not present

## 2023-11-23 DIAGNOSIS — Z7902 Long term (current) use of antithrombotics/antiplatelets: Secondary | ICD-10-CM | POA: Insufficient documentation

## 2023-11-23 DIAGNOSIS — Z79818 Long term (current) use of other agents affecting estrogen receptors and estrogen levels: Secondary | ICD-10-CM | POA: Diagnosis not present

## 2023-11-23 DIAGNOSIS — C61 Malignant neoplasm of prostate: Secondary | ICD-10-CM

## 2023-11-23 DIAGNOSIS — R5383 Other fatigue: Secondary | ICD-10-CM | POA: Insufficient documentation

## 2023-11-23 DIAGNOSIS — Z79899 Other long term (current) drug therapy: Secondary | ICD-10-CM | POA: Insufficient documentation

## 2023-11-23 DIAGNOSIS — G629 Polyneuropathy, unspecified: Secondary | ICD-10-CM | POA: Diagnosis not present

## 2023-11-23 DIAGNOSIS — C7800 Secondary malignant neoplasm of unspecified lung: Secondary | ICD-10-CM | POA: Diagnosis not present

## 2023-11-23 DIAGNOSIS — Z803 Family history of malignant neoplasm of breast: Secondary | ICD-10-CM | POA: Diagnosis not present

## 2023-11-23 DIAGNOSIS — D649 Anemia, unspecified: Secondary | ICD-10-CM | POA: Insufficient documentation

## 2023-11-23 DIAGNOSIS — Z79891 Long term (current) use of opiate analgesic: Secondary | ICD-10-CM | POA: Diagnosis not present

## 2023-11-23 DIAGNOSIS — C7952 Secondary malignant neoplasm of bone marrow: Secondary | ICD-10-CM | POA: Diagnosis not present

## 2023-11-23 LAB — CMP (CANCER CENTER ONLY)
ALT: 20 U/L (ref 0–44)
AST: 31 U/L (ref 15–41)
Albumin: 3.9 g/dL (ref 3.5–5.0)
Alkaline Phosphatase: 255 U/L — ABNORMAL HIGH (ref 38–126)
Anion gap: 16 — ABNORMAL HIGH (ref 5–15)
BUN: 15 mg/dL (ref 8–23)
CO2: 22 mmol/L (ref 22–32)
Calcium: 9.3 mg/dL (ref 8.9–10.3)
Chloride: 100 mmol/L (ref 98–111)
Creatinine: 0.85 mg/dL (ref 0.61–1.24)
GFR, Estimated: >60 mL/min (ref >60)
Glucose, Bld: 182 mg/dL — ABNORMAL HIGH (ref 70–99)
Potassium: 3.6 mmol/L (ref 3.5–5.1)
Sodium: 139 mmol/L (ref 135–145)
Total Bilirubin: 0.3 mg/dL (ref 0.0–1.2)
Total Protein: 7 g/dL (ref 6.5–8.1)

## 2023-11-23 LAB — CBC WITH DIFFERENTIAL (CANCER CENTER ONLY)
Abs Immature Granulocytes: 0.08 K/uL — ABNORMAL HIGH (ref 0.00–0.07)
Basophils Absolute: 0 K/uL (ref 0.0–0.1)
Basophils Relative: 0 %
Eosinophils Absolute: 0 K/uL (ref 0.0–0.5)
Eosinophils Relative: 1 %
HCT: 38.5 % — ABNORMAL LOW (ref 39.0–52.0)
Hemoglobin: 12 g/dL — ABNORMAL LOW (ref 13.0–17.0)
Immature Granulocytes: 1 %
Lymphocytes Relative: 36 %
Lymphs Abs: 3 K/uL (ref 0.7–4.0)
MCH: 28.4 pg (ref 26.0–34.0)
MCHC: 31.2 g/dL (ref 30.0–36.0)
MCV: 91 fL (ref 80.0–100.0)
Monocytes Absolute: 0.4 K/uL (ref 0.1–1.0)
Monocytes Relative: 4 %
Neutro Abs: 4.7 K/uL (ref 1.7–7.7)
Neutrophils Relative %: 58 %
Platelet Count: 268 K/uL (ref 150–400)
RBC: 4.23 MIL/uL (ref 4.22–5.81)
RDW: 15.3 % (ref 11.5–15.5)
WBC Count: 8.2 K/uL (ref 4.0–10.5)
nRBC: 0 % (ref 0.0–0.2)

## 2023-11-23 LAB — PSA: Prostatic Specific Antigen: >1500 ng/mL — ABNORMAL HIGH (ref 0.00–4.00)

## 2023-11-23 MED ORDER — LEUPROLIDE ACETATE (3 MONTH) 22.5 MG IM KIT
22.5000 mg | PACK | Freq: Once | INTRAMUSCULAR | Status: AC
  Administered 2023-11-23: 22.5 mg via INTRAMUSCULAR
  Filled 2023-11-23: qty 22.5

## 2023-11-23 MED ORDER — KETOCONAZOLE 200 MG PO TABS: 200.0000 mg | ORAL_TABLET | Freq: Two times a day (BID) | ORAL | 5 refills | Status: DC

## 2023-11-23 MED ORDER — SODIUM CHLORIDE 0.9 % IV SOLN: Freq: Once | INTRAVENOUS | Status: AC

## 2023-11-23 MED ORDER — ZOLEDRONIC ACID 4 MG/100ML IV SOLN
4.0000 mg | Freq: Once | INTRAVENOUS | Status: AC
  Administered 2023-11-23: 4 mg via INTRAVENOUS
  Filled 2023-11-23: qty 100

## 2023-11-23 NOTE — Patient Instructions (Signed)
 Leuprolide  Solution for Injection What is this medication? LEUPROLIDE  (loo PROE lide) reduces the symptoms of prostate cancer. It works by decreasing levels of the hormone testosterone in the body. This prevents prostate cancer cells from spreading or growing. This medicine may be used for other purposes; ask your health care provider or pharmacist if you have questions. COMMON BRAND NAME(S): Lupron  What should I tell my care team before I take this medication? They need to know if you have any of these conditions: Diabetes Heart attack Heart disease High blood pressure High cholesterol Pain or difficulty passing urine Spinal cord metastasis Stroke Tobacco use An unusual or allergic reaction to leuprolide , other medications, foods, dyes, or preservatives Pregnant or trying to get pregnant Breast-feeding How should I use this medication? This medication is for injection under the skin or into a muscle. You will be taught how to prepare and give this medication. Use exactly as directed. Take your medication at regular intervals. Do not take it more often than directed. It is important that you put your used needles and syringes in a special sharps container. Do not put them in a trash can. If you do not have a sharps container, call your care team to get one. A special MedGuide will be given to you by the pharmacist with each prescription and refill. Be sure to read this information carefully each time. Talk to your care team about the use of this medication in children. While this medication may be prescribed for children as young as 8 years for selected conditions, precautions do apply. Overdosage: If you think you have taken too much of this medicine contact a poison control center or emergency room at once. NOTE: This medicine is only for you. Do not share this medicine with others. What if I miss a dose? If you miss a dose, take it as soon as you can. If it is almost time for your next  dose, take only that dose. Do not take double or extra doses. What may interact with this medication? Do not take this medication with any of the following: Chasteberry Cisapride Dronedarone Pimozide Thioridazine This medication may also interact with the following: Estrogen or progestin hormones Herbal or dietary supplements, like black cohosh or DHEA Other medications that cause heart rhythm changes Testosterone This list may not describe all possible interactions. Give your health care provider a list of all the medicines, herbs, non-prescription drugs, or dietary supplements you use. Also tell them if you smoke, drink alcohol, or use illegal drugs. Some items may interact with your medicine. What should I watch for while using this medication? Visit your care team for regular checks on your progress. During the first week, your symptoms may get worse, but then will improve as you continue your treatment. You may get hot flashes, increased bone pain, increased difficulty passing urine, or an aggravation of nerve symptoms. Discuss these effects with your care team, some of them may improve with continued use of this medication. Patients may experience a menstrual cycle or spotting during the first 2 months of therapy with this medication. If this continues, contact your care team. This medication may increase blood sugar. The risk may be higher in patients who already have diabetes. Ask your care team what you can do to lower your risk of diabetes while taking this medication. What side effects may I notice from receiving this medication? Side effects that you should report to your care team as soon as possible: Allergic reactions--skin rash,  itching, hives, swelling of the face, lips, tongue, or throat Heart attack--pain or tightness in the chest, shoulders, arms, or jaw, nausea, shortness of breath, cold or clammy skin, feeling faint or lightheaded Heart rhythm changes--fast or irregular  heartbeat, dizziness, feeling faint or lightheaded, chest pain, trouble breathing High blood sugar (hyperglycemia)--increased thirst or amount of urine, unusual weakness or fatigue, blurry vision New or worsening seizures Redness, blistering, peeling, or loosening of the skin, including inside the mouth Stroke--sudden numbness or weakness of the face, arm, or leg, trouble speaking, confusion, trouble walking, loss of balance or coordination, dizziness, severe headache, change in vision Swelling and pain of the tumor site or lymph nodes Side effects that usually do not require medical attention (report these to your care team if they continue or are bothersome): Change in sex drive or performance Hot flashes Joint pain Pain, redness, or irritation at injection site Swelling of the ankles, hands, or feet Unusual weakness or fatigue This list may not describe all possible side effects. Call your doctor for medical advice about side effects. You may report side effects to FDA at 1-800-FDA-1088. Where should I keep my medication? Keep out of the reach of children and pets. Store below 25 degrees C (77 degrees F). Do not freeze. Protect from light. Get rid of any unused medication after the expiration date. To get rid of medications that are no longer needed or have expired: Take the medication to a medication take-back program. Check with your pharmacy or law enforcement to find a location. If you cannot return the medication, ask your pharmacist or care team how to get rid of this medication safely. NOTE: This sheet is a summary. It may not cover all possible information. If you have questions about this medicine, talk to your doctor, pharmacist, or health care provider.  2024 Elsevier/Gold Standard (2023-02-11 00:00:00)Zoledronic  Acid Injection (Cancer) What is this medication? ZOLEDRONIC  ACID (ZOE le dron ik AS id) treats high calcium  levels in the blood caused by cancer. It may also be used  with chemotherapy to treat weakened bones caused by cancer. It works by slowing down the release of calcium  from bones. This lowers calcium  levels in your blood. It also makes your bones stronger and less likely to break (fracture). It belongs to a group of medications called bisphosphonates. This medicine may be used for other purposes; ask your health care provider or pharmacist if you have questions. COMMON BRAND NAME(S): Zometa , Zometa  Powder What should I tell my care team before I take this medication? They need to know if you have any of these conditions: Dehydration Dental disease Kidney disease Liver disease Low levels of calcium  in the blood Lung or breathing disease, such as asthma Receiving steroids, such as dexamethasone or prednisone An unusual or allergic reaction to zoledronic  acid, other medications, foods, dyes, or preservatives Pregnant or trying to get pregnant Breast-feeding How should I use this medication? This medication is injected into a vein. It is given by your care team in a hospital or clinic setting. Talk to your care team about the use of this medication in children. Special care may be needed. Overdosage: If you think you have taken too much of this medicine contact a poison control center or emergency room at once. NOTE: This medicine is only for you. Do not share this medicine with others. What if I miss a dose? Keep appointments for follow-up doses. It is important not to miss your dose. Call your care team if you are  unable to keep an appointment. What may interact with this medication? Certain antibiotics given by injection Diuretics, such as bumetanide, furosemide NSAIDs, medications for pain and inflammation, such as ibuprofen or naproxen Teriparatide Thalidomide This list may not describe all possible interactions. Give your health care provider a list of all the medicines, herbs, non-prescription drugs, or dietary supplements you use. Also tell  them if you smoke, drink alcohol, or use illegal drugs. Some items may interact with your medicine. What should I watch for while using this medication? Visit your care team for regular checks on your progress. It may be some time before you see the benefit from this medication. Some people who take this medication have severe bone, joint, or muscle pain. This medication may also increase your risk for jaw problems or a broken thigh bone. Tell your care team right away if you have severe pain in your jaw, bones, joints, or muscles. Tell you care team if you have any pain that does not go away or that gets worse. Tell your dentist and dental surgeon that you are taking this medication. You should not have major dental surgery while on this medication. See your dentist to have a dental exam and fix any dental problems before starting this medication. Take good care of your teeth while on this medication. Make sure you see your dentist for regular follow-up appointments. You should make sure you get enough calcium  and vitamin D while you are taking this medication. Discuss the foods you eat and the vitamins you take with your care team. Check with your care team if you have severe diarrhea, nausea, and vomiting, or if you sweat a lot. The loss of too much body fluid may make it dangerous for you to take this medication. You may need bloodwork while taking this medication. Talk to your care team if you wish to become pregnant or think you might be pregnant. This medication can cause serious birth defects. What side effects may I notice from receiving this medication? Side effects that you should report to your care team as soon as possible: Allergic reactions--skin rash, itching, hives, swelling of the face, lips, tongue, or throat Kidney injury--decrease in the amount of urine, swelling of the ankles, hands, or feet Low calcium  level--muscle pain or cramps, confusion, tingling, or numbness in the hands or  feet Osteonecrosis of the jaw--pain, swelling, or redness in the mouth, numbness of the jaw, poor healing after dental work, unusual discharge from the mouth, visible bones in the mouth Severe bone, joint, or muscle pain Side effects that usually do not require medical attention (report to your care team if they continue or are bothersome): Constipation Fatigue Fever Loss of appetite Nausea Stomach pain This list may not describe all possible side effects. Call your doctor for medical advice about side effects. You may report side effects to FDA at 1-800-FDA-1088. Where should I keep my medication? This medication is given in a hospital or clinic. It will not be stored at home. NOTE: This sheet is a summary. It may not cover all possible information. If you have questions about this medicine, talk to your doctor, pharmacist, or health care provider.  2024 [employer]/Gold Standard (2021-04-24 00:00:00)

## 2023-11-23 NOTE — Telephone Encounter (Signed)
 Patient has been scheduled for follow-up visit per 11/23/23 LOS.  Pt given an appt calendar with date and time.

## 2023-11-29 ENCOUNTER — Encounter: Payer: Self-pay | Admitting: Oncology

## 2023-12-05 ENCOUNTER — Other Ambulatory Visit: Payer: Self-pay | Admitting: Hematology and Oncology

## 2023-12-05 ENCOUNTER — Other Ambulatory Visit: Payer: Self-pay

## 2023-12-05 ENCOUNTER — Telehealth: Payer: Self-pay

## 2023-12-05 DIAGNOSIS — M898X9 Other specified disorders of bone, unspecified site: Secondary | ICD-10-CM

## 2023-12-05 DIAGNOSIS — C7951 Secondary malignant neoplasm of bone: Secondary | ICD-10-CM

## 2023-12-05 DIAGNOSIS — C61 Malignant neoplasm of prostate: Secondary | ICD-10-CM

## 2023-12-05 MED ORDER — HYDROCODONE-ACETAMINOPHEN 5-325 MG PO TABS: 1.0000 | ORAL_TABLET | ORAL | 0 refills | Status: DC | PRN

## 2023-12-05 MED ORDER — MORPHINE SULFATE ER 15 MG PO TBCR: 15.0000 mg | EXTENDED_RELEASE_TABLET | Freq: Two times a day (BID) | ORAL | 0 refills | Status: DC

## 2023-12-05 NOTE — Telephone Encounter (Signed)
 Pt was seen in ER again yesterday with uncontrolled pain. He is taking MS 15mg  po q 12 hr, tylenol  for break through. Oneil mentions that Dr Cornelius told them at last visit that she may increase his pain medication. Please advise.

## 2023-12-09 ENCOUNTER — Other Ambulatory Visit: Payer: Self-pay | Admitting: Hematology and Oncology

## 2023-12-09 MED ORDER — MORPHINE SULFATE ER 30 MG PO TBCR: 30.0000 mg | EXTENDED_RELEASE_TABLET | Freq: Two times a day (BID) | ORAL | 0 refills | Status: DC

## 2023-12-21 ENCOUNTER — Inpatient Hospital Stay: Admitting: Oncology

## 2023-12-21 ENCOUNTER — Other Ambulatory Visit: Payer: Self-pay | Admitting: Oncology

## 2023-12-21 ENCOUNTER — Inpatient Hospital Stay: Attending: Oncology

## 2023-12-21 ENCOUNTER — Inpatient Hospital Stay

## 2023-12-21 ENCOUNTER — Encounter: Payer: Self-pay | Admitting: Oncology

## 2023-12-21 ENCOUNTER — Telehealth: Payer: Self-pay | Admitting: Oncology

## 2023-12-21 VITALS — BP 126/67 | HR 70 | Temp 97.8°F | Resp 16 | Ht 67.0 in | Wt 177.3 lb

## 2023-12-21 DIAGNOSIS — C61 Malignant neoplasm of prostate: Secondary | ICD-10-CM

## 2023-12-21 DIAGNOSIS — Z7983 Long term (current) use of bisphosphonates: Secondary | ICD-10-CM | POA: Insufficient documentation

## 2023-12-21 DIAGNOSIS — C7951 Secondary malignant neoplasm of bone: Secondary | ICD-10-CM | POA: Insufficient documentation

## 2023-12-21 DIAGNOSIS — C7800 Secondary malignant neoplasm of unspecified lung: Secondary | ICD-10-CM | POA: Insufficient documentation

## 2023-12-21 DIAGNOSIS — G8929 Other chronic pain: Secondary | ICD-10-CM | POA: Insufficient documentation

## 2023-12-21 DIAGNOSIS — Z8041 Family history of malignant neoplasm of ovary: Secondary | ICD-10-CM | POA: Insufficient documentation

## 2023-12-21 DIAGNOSIS — Z7902 Long term (current) use of antithrombotics/antiplatelets: Secondary | ICD-10-CM | POA: Diagnosis not present

## 2023-12-21 DIAGNOSIS — Z9221 Personal history of antineoplastic chemotherapy: Secondary | ICD-10-CM | POA: Insufficient documentation

## 2023-12-21 DIAGNOSIS — C7952 Secondary malignant neoplasm of bone marrow: Secondary | ICD-10-CM | POA: Diagnosis not present

## 2023-12-21 DIAGNOSIS — E785 Hyperlipidemia, unspecified: Secondary | ICD-10-CM | POA: Insufficient documentation

## 2023-12-21 DIAGNOSIS — R319 Hematuria, unspecified: Secondary | ICD-10-CM | POA: Diagnosis not present

## 2023-12-21 DIAGNOSIS — D649 Anemia, unspecified: Secondary | ICD-10-CM | POA: Insufficient documentation

## 2023-12-21 DIAGNOSIS — Z79899 Other long term (current) drug therapy: Secondary | ICD-10-CM | POA: Insufficient documentation

## 2023-12-21 DIAGNOSIS — N134 Hydroureter: Secondary | ICD-10-CM | POA: Insufficient documentation

## 2023-12-21 DIAGNOSIS — Z803 Family history of malignant neoplasm of breast: Secondary | ICD-10-CM | POA: Insufficient documentation

## 2023-12-21 LAB — CBC WITH DIFFERENTIAL (CANCER CENTER ONLY)
Abs Immature Granulocytes: 0.07 K/uL (ref 0.00–0.07)
Basophils Absolute: 0 K/uL (ref 0.0–0.1)
Basophils Relative: 0 %
Eosinophils Absolute: 0 K/uL (ref 0.0–0.5)
Eosinophils Relative: 1 %
HCT: 38.3 % — ABNORMAL LOW (ref 39.0–52.0)
Hemoglobin: 11.8 g/dL — ABNORMAL LOW (ref 13.0–17.0)
Immature Granulocytes: 1 %
Lymphocytes Relative: 36 %
Lymphs Abs: 3 K/uL (ref 0.7–4.0)
MCH: 27.5 pg (ref 26.0–34.0)
MCHC: 30.8 g/dL (ref 30.0–36.0)
MCV: 89.3 fL (ref 80.0–100.0)
Monocytes Absolute: 0.5 K/uL (ref 0.1–1.0)
Monocytes Relative: 6 %
Neutro Abs: 4.6 K/uL (ref 1.7–7.7)
Neutrophils Relative %: 56 %
Platelet Count: 300 K/uL (ref 150–400)
RBC: 4.29 MIL/uL (ref 4.22–5.81)
RDW: 15 % (ref 11.5–15.5)
WBC Count: 8.2 K/uL (ref 4.0–10.5)
nRBC: 0 % (ref 0.0–0.2)

## 2023-12-21 LAB — CMP (CANCER CENTER ONLY)
ALT: 17 U/L (ref 0–44)
AST: 33 U/L (ref 15–41)
Albumin: 4.2 g/dL (ref 3.5–5.0)
Alkaline Phosphatase: 302 U/L — ABNORMAL HIGH (ref 38–126)
Anion gap: 14 (ref 5–15)
BUN: 15 mg/dL (ref 8–23)
CO2: 24 mmol/L (ref 22–32)
Calcium: 8.8 mg/dL — ABNORMAL LOW (ref 8.9–10.3)
Chloride: 100 mmol/L (ref 98–111)
Creatinine: 0.93 mg/dL (ref 0.61–1.24)
GFR, Estimated: >60 mL/min (ref >60)
Glucose, Bld: 152 mg/dL — ABNORMAL HIGH (ref 70–99)
Potassium: 3.8 mmol/L (ref 3.5–5.1)
Sodium: 138 mmol/L (ref 135–145)
Total Bilirubin: 0.3 mg/dL (ref 0.0–1.2)
Total Protein: 7.1 g/dL (ref 6.5–8.1)

## 2023-12-21 LAB — PSA: Prostatic Specific Antigen: >1500 ng/mL — ABNORMAL HIGH (ref 0.00–4.00)

## 2023-12-21 NOTE — Progress Notes (Signed)
 [facility]  [address] [place],  [place]  [address] [phone]    Clinic Day: 12/21/2023  Referring physician: Paniagua, Tiziana, MD  ASSESSMENT & PLAN:  Assessment: Malignant neoplasm of prostate [facility]) Stage IVA prostate cancer diagnosed in January 2016.  He had progressive disease in the bone and lung in September 2023, so was placed on docetaxel  chemotherapy and continued leuprolide  and zoledronic  acid.  His PSA went down from 536 to 400.75 after 3 cycles, then to 284 with the 5th cycle, and then 203 in March 2024. PSA then went up to 261 in May. We decided to give him a break from chemotherapy at that time.  We continued leuprolide  and zolendronic acid every 3 months. The PSA was 1156. PSMA PET in September 2024 revealed progression of skeletal metastasis. Multiple new radiotracer avid lesions within the spine. Individual lesions are increased in size compared to prior accompanied by sclerotic CT change. 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 No evidence of nodal metastasis or visceral metastasis. He was receiving palliative chemotherapy with cabazitaxel /prednisone  until June, 2025. It was then stopped due to significantly rising PSA. PSA was 426 in early April. His PSA in June increased from 822.82 to 1,013.69 within a month, and then to 1,061 one month later, now over 1,500 as of August. PET scan done on 10/26/2023 which revealed his activity within the multiple skeletal lesions is slightly decreased; however, the individual lesions are slightly expanded in radiotracer activity tissue involvement. No new foci of disease, no lymphadenopathy or visceral metastasis, and the overall minimal interval change in widespread metastatic prostate skeletal disease. We will continue Lupron  injections and consider addition of Ketoconazole  since he has been through almost everything else, except for Pluvicto. We will consider this when he becomes symptomatic.      Secondary malignant neoplasm of bone and bone marrow (HCC) He continues zoledronic  acid every 3 months in addition to his cancer therapy.  We have stopped chemotherapy with cabazitaxel /prednisone .  His last dose of zoledronic  acid was in early September. He has had some escalation of his pain but downplays it, although he is doing better with the increased MS Contin  30 mg Q12hrs.    Abnormal radiologic findings on diagnostic imaging of renal pelvis, ureter, or bladder CTA abdomen and CT urogram were done in the ER at [facility] last month to evaluate hematuria.  These revealed increased bilateral ureteral mucosal enhancement with periureteral fat stranding, which may reflect underlying urinary tract infection.. No evidence of pyelonephritis. Mild bilateral hydroureter, left greater than right, with a possible enhancing 3 mm lesion within the mid right ureter. No urinary tract calculi. Limited evaluation of the bladder due to decompressed state. Possible asymmetric right inferolateral bladder wall thickening. Cystoscopy was negative by [facility] at [facility]. She performed cystoscopy, bilateral retrograde pyelogram, bilateral ureteroscopy and with ureteral stent placement in May.  Evaluation was negative.  The ureteral stent was removed.   Anemia He had an upper GI hemorrhage earlier this year and was referred to Dr. Larene. Fortunately, he just had the one episode and was transfused. His hemoglobin came up to 11.1 and has remained stable. He had a EGD scheduled in April, but it was canceled. His cardiologist Dr. Court was not comfortable with him stopping clopidogrel  for another year. His bleeding has stopped and so we will not pursue an EGD at this time unless he begins having melena again and/or a significant drop in his hemoglobin.  He did have  some blood loss with hematuria in the spring but his hemoglobin did come up to 12.4 and is now stable at 11.8.     Plan: I increased his MS  contin from 15 mg to 30 mg every 12hrs and this helps control his pain well with just tylenol  prn.  He has a WBC of 8.2, low hemoglobin of 11.8 down from 12.0, and platelet count of 300,000. His CMP is normal other than a mildly low calcium of 8.8 and elevated non-fasting glucose of 152 and alkaline phosphatase of 302 up from 255. He received his Zometa  and Lupron  injection last month, and won't be due for his next injection until December.  His PSA today is pending. However, his last 2 readings were >1,500.00. It is difficult to know exactly how much of an increase or decrease this is if >1,500. We discussed the option of if pursuing treatment with Plucvito at [facility] when we should start the process as it is a series of treatments and a length of time to evaluate and set it up. For now we will hold off on as he is stable but I have high hopes that he will be able to tolerate this when started. I will see him back in 4 weeks with CBC, CMP, and PSA.  The patient understands and agrees with this plan of care. He knows to call if he has concerns sooner.  I provided 17 minutes of face-to-face time during this encounter and > 50% was spent counseling as documented under my assessment and plan.   Marvin VEAR Cornish, MD  [facility] - A DEPT OF [facility] [address] [place] [address] Dept: [phone] Dept Fax: [phone]   No orders of the defined types were placed in this encounter.   CHIEF COMPLAINT:  CC: Stage IVA prostate cancer  Current Treatment: leuprolide  and zoledronic  acid  HISTORY OF PRESENT ILLNESS:   Oncology History  Malignant neoplasm of prostate (HCC)  03/30/2014 Cancer Staging   Staging form: Prostate, AJCC 8th Edition - Clinical stage from 03/30/2014: Stage IVA (cT3b, cN1, cM0, PSA: 51.8, Grade Group: 4) - Signed by [facility] VEAR, MD on 11/14/2020 Histopathologic type: Adenocarcinoma, NOS Stage prefix:  Initial diagnosis Prostate specific antigen (PSA) range: 20 or greater Gleason primary pattern: 4 Gleason secondary pattern: 4 Gleason score: 8 Histologic grading system: 5 grade system Location of positive needle core biopsies: Beyond primary site Prognostic indicators: May 2016 Robotic prostatectomy with mult. Pos margins, extra prostatic extension, 2/6 pos nodes. Scan June 2016 positive at T10  PSA up to 216.3 by August Placed on lupron  and casodex Stage used in treatment planning: Yes National guidelines used in treatment planning: Yes Type of national guideline used in treatment planning: NCCN   02/18/2020 Initial Diagnosis   Malignant neoplasm of prostate (HCC)   12/01/2021 - 07/19/2022 Chemotherapy   Patient is on Treatment Plan : PROSTATE Docetaxel  (75) + Prednisone  q21d     12/21/2022 - 08/26/2023 Chemotherapy   Patient is on Treatment Plan : PROSTATE Cabazitaxel  (20) D1 + Prednisone  D1-21 q21d     Secondary malignant neoplasm of bone and bone marrow (HCC)  02/18/2020 Initial Diagnosis   Secondary malignant neoplasm of bone and bone marrow (HCC)   12/01/2021 - 07/19/2022 Chemotherapy   Patient is on Treatment Plan : PROSTATE Docetaxel  (75) + Prednisone  q21d     12/21/2022 - 08/26/2023 Chemotherapy   Patient is on Treatment Plan :  PROSTATE Cabazitaxel  (20) D1 + Prednisone  D1-21 q21d     Malignant neoplasm of prostate metastatic to lung (HCC)  11/25/2021 Initial Diagnosis   Malignant neoplasm of prostate metastatic to lung [facility])   12/21/2022 - 08/26/2023 Chemotherapy   Patient is on Treatment Plan : PROSTATE Cabazitaxel  (20) D1 + Prednisone  D1-21 q21d       INTERVAL HISTORY:  [facility] is here today for repeat clinical assessment for his stage IV prostate cancer. Patient states that he feels well and has continues to have chronic aches and pain. I increased his MS contin  from 15 mg to 30 mg every 12hrs and this helps control his pain well with tylenol  prn.  He has a WBC of 8.2, low  hemoglobin of 11.8 down from 12.0, and platelet count of 300,000. His CMP is normal other than a mildly low calcium of 8.8 and elevated non-fasting glucose of 152 and alkaline phosphatase of 302 up from 255. He received his Zometa  and Lupron  injection last month and won't be due for his next injection until December.  His PSA today is pending. However, his last 2 readings were >1,500.00. It is difficult to know exactly how much of an increase or decrease this is if >1,5000. We discussed the option of if pursuing treatment with [facility] when we should start the process as it is a series of treatments and a length of time to set it up. For now we will hold off on as he is stable but I have high hopes that he will be able to tolerate this when started. I will see him back in 4 weeks with CBC, CMP, and PSA. He denies fever, chills, night sweats, or other signs of infection. He denies cardiorespiratory and gastrointestinal issues. His appetite is good but not eating as much as last month. His weight has decreased 4 pounds over last 4 weeks.    REVIEW OF SYSTEMS:  Review of Systems  Constitutional:  Positive for fatigue. Negative for appetite change, chills, diaphoresis, fever and unexpected weight change.  HENT:  Negative.  Negative for hearing loss, lump/mass, mouth sores, nosebleeds, sore throat, tinnitus, trouble swallowing and voice change.   Eyes: Negative.  Negative for eye problems and icterus.  Respiratory: Negative.  Negative for chest tightness, cough, hemoptysis, shortness of breath and wheezing.   Cardiovascular: Negative.  Negative for chest pain, leg swelling and palpitations.  Gastrointestinal: Negative.  Negative for abdominal distention, abdominal pain, blood in stool, constipation, diarrhea, nausea and vomiting.  Endocrine: Negative.  Negative for hot flashes.  Genitourinary: Negative.  Negative for bladder incontinence, difficulty urinating, dyspareunia, dysuria, frequency,  hematuria, nocturia, pelvic pain and penile discharge.   Musculoskeletal:  Positive for arthralgias (chronic) and back pain (chronic). Negative for flank pain, gait problem, myalgias, neck pain and neck stiffness.       Pain all over, as usual  Skin:  Negative for itching, rash and wound.  Neurological:  Positive for numbness (of both lower legs). Negative for dizziness, extremity weakness, gait problem, headaches, light-headedness, seizures and speech difficulty.  Hematological: Negative.  Negative for adenopathy. Does not bruise/bleed easily.  Psychiatric/Behavioral: Negative.  Negative for confusion, depression, sleep disturbance and suicidal ideas. The patient is not nervous/anxious.     VITALS:  Blood pressure 126/67, pulse 70, temperature 97.8 F (36.6 C), temperature source Oral, resp. rate 16, height 5' 7 (1.702 m), weight 177 lb 4.8 oz (80.4 kg), SpO2 99%.  Wt Readings from Last 3 Encounters:  12/21/23 177  lb 4.8 oz (80.4 kg)  11/23/23 181 lb 4.8 oz (82.2 kg)  10/14/23 177 lb 8 oz (80.5 kg)  Body mass index is 27.77 kg/m.  Performance status (ECOG): 1 - Symptomatic but completely ambulatory  PHYSICAL EXAM:  Physical Exam Vitals and nursing note reviewed.  Constitutional:      General: He is not in acute distress.    Appearance: Normal appearance. He is normal weight. He is not ill-appearing, toxic-appearing or diaphoretic.  HENT:     Head: Normocephalic and atraumatic.     Right Ear: Tympanic membrane, ear canal and external ear normal. There is no impacted cerumen.     Left Ear: Tympanic membrane, ear canal and external ear normal. There is no impacted cerumen.     Nose: Nose normal. No congestion or rhinorrhea.     Mouth/Throat:     Mouth: Mucous membranes are moist.     Pharynx: Oropharynx is clear. No oropharyngeal exudate or posterior oropharyngeal erythema.  Eyes:     General: No scleral icterus.       Right eye: No discharge.        Left eye: No discharge.      Extraocular Movements: Extraocular movements intact.     Conjunctiva/sclera: Conjunctivae normal.     Pupils: Pupils are equal, round, and reactive to light.  Cardiovascular:     Rate and Rhythm: Normal rate and regular rhythm.     Pulses: Normal pulses.     Heart sounds: Normal heart sounds. No murmur heard.    No friction rub. No gallop.  Pulmonary:     Effort: Pulmonary effort is normal. No respiratory distress.     Breath sounds: Normal breath sounds. No stridor. No wheezing, rhonchi or rales.  Chest:     Chest wall: No tenderness.  Abdominal:     General: Bowel sounds are normal. There is no distension.     Palpations: Abdomen is soft. There is no hepatomegaly, splenomegaly or mass.     Tenderness: There is no abdominal tenderness. There is no right CVA tenderness, left CVA tenderness, guarding or rebound.     Hernia: No hernia is present.  Musculoskeletal:        General: Normal range of motion.     Cervical back: Normal range of motion and neck supple. No tenderness.     Right lower leg: Edema (trace) present.     Left lower leg: Edema (trace) present.  Lymphadenopathy:     Cervical: No cervical adenopathy.     Upper Body:     Right upper body: No supraclavicular or axillary adenopathy.     Left upper body: No supraclavicular or axillary adenopathy.     Lower Body: No right inguinal adenopathy. No left inguinal adenopathy.  Skin:    General: Skin is warm and dry.     Coloration: Skin is not jaundiced or pale.     Findings: No bruising, erythema, lesion or rash.  Neurological:     General: No focal deficit present.     Mental Status: He is alert and oriented to person, place, and time. Mental status is at baseline.     Cranial Nerves: No cranial nerve deficit.  Psychiatric:        Mood and Affect: Mood normal.        Behavior: Behavior normal.        Thought Content: Thought content normal.        Judgment: Judgment normal.    LABS:  Latest Ref Rng & Units  12/21/2023    8:26 AM 11/23/2023    8:15 AM 10/14/2023    8:51 AM  CBC  WBC 4.0 - 10.5 K/uL 8.2  8.2  9.7   Hemoglobin 13.0 - 17.0 g/dL 88.1  87.9  87.5   Hematocrit 39.0 - 52.0 % 38.3  38.5  39.1   Platelets 150 - 400 K/uL 300  268  245       Latest Ref Rng & Units 12/21/2023    8:26 AM 11/23/2023    8:15 AM 10/14/2023    8:51 AM  CMP  Glucose 70 - 99 mg/dL 847  817  837   BUN 8 - 23 mg/dL 15  15  14    Creatinine 0.61 - 1.24 mg/dL 9.06  9.14  9.17   Sodium 135 - 145 mmol/L 138  139  140   Potassium 3.5 - 5.1 mmol/L 3.8  3.6  3.8   Chloride 98 - 111 mmol/L 100  100  101   CO2 22 - 32 mmol/L 24  22  24    Calcium 8.9 - 10.3 mg/dL 8.8  9.3  9.6   Total Protein 6.5 - 8.1 g/dL 7.1  7.0  7.1   Total Bilirubin 0.0 - 1.2 mg/dL 0.3  0.3  0.4   Alkaline Phos 38 - 126 U/L 302  255  130   AST 15 - 41 U/L 33  31  34   ALT 0 - 44 U/L 17  20  18     Component Ref Range & Units (hover) 4 wk ago (11/23/23) 2 mo ago (10/14/23) 3 mo ago (09/14/23) 3 mo ago (08/24/23) 4 mo ago (07/27/23) 5 mo ago (07/06/23) 6 mo ago (06/15/23)  Prostatic Specific Antigen >1,500.00 High  >1,500.00 High  CM 1,061.05 High  CM 1,013.69 High  CM 822.82 High  CM 438.43 High  CM 426.49 High  CM   Lab Results  Component Value Date   PSA1 1,362.0 (H) 02/01/2023   PSA1 1,317.0 (H) 01/06/2023   PSA1 334.0 (H) 10/30/2021   Lab Results  Component Value Date   TIBC 398 01/29/2022   FERRITIN 206 01/29/2022   IRONPCTSAT 25 01/29/2022   STUDIES:   EXAM: 10/26/2023 NUCLEAR MEDICINE PET SKULL BASE TO THIGH IMPRESSION: 1. Activity within the multiple skeletal lesions is slightly decreased; however, the individual lesions are slightly expanded in radiotracer activity tissue involvement. Overall minimal interval change in widespread metastatic prostate skeletal disease. 2. No new foci of disease. 3. No lymphadenopathy or visceral metastasis.  Exam: 06/01/2023 CT ABDOMEN AND PELVIS WITHOUT CONTRAST IMPRESSIONS: Left-sided  hyfrouteronephrosis which was not previously present. Wuestionable soft tissue lesion in the distal ureter on the left. There may be a mild amount of stranding around the left kidney. There is some mild stranding around the right ureter of uncertain significance as well. Correlate clinically/with laboratory findings. These findings were not previously present. A CT urogram may provide additional information.     HISTORY:   Past Medical History:  Diagnosis Date   Hyperlipidemia    Hypokalemia 03/24/2023   Malignant neoplasm of prostate (HCC)    Supraventricular tachycardia     Past Surgical History:  Procedure Laterality Date   CORONARY STENT INTERVENTION N/A 02/08/2022   Procedure: CORONARY STENT INTERVENTION;  Surgeon: Mady Bruckner, MD;  Location: [facility] INVASIVE CV [facility];  Service: Cardiovascular;  Laterality: N/A;   LEFT HEART CATH AND CORONARY ANGIOGRAPHY N/A 02/08/2022   Procedure: LEFT HEART CATH  AND CORONARY ANGIOGRAPHY;  Surgeon: Mady Bruckner, MD;  Location: [facility] INVASIVE CV LAB;  Service: Cardiovascular;  Laterality: N/A;   PROSTATECTOMY  07/2014   TONSILLECTOMY      Family History  Problem Relation Age of Onset   Breast cancer Mother 37   Ovarian cancer Mother 20   Kidney Stones Sister    Breast cancer Paternal Grandmother 89    Social History:  reports that he has never smoked. He has never used smokeless tobacco. He reports that he does not currently use alcohol. He reports that he does not use drugs.The patient is alone today.  Allergies:  Allergies  Allergen Reactions   Atorvastatin    Rosuvastatin Other (See Comments)    Other reaction(s): Muscle pain   Simvastatin Other (See Comments)    Other reaction(s): Joint pain, Muscle pain   Sulfa Antibiotics    Sulfamethoxazole-Trimethoprim Other (See Comments)    Unknown as to interaction was a baby.    Current Medications: Current Outpatient Medications  Medication Sig Dispense Refill   Alirocumab 75 MG/ML  SOAJ Inject 75 mg into the skin.     Cholecalciferol 25 MCG (1000 UT) tablet Take 1,000 Units by mouth in the morning and at bedtime.     clopidogrel  (PLAVIX ) 75 MG tablet Take 75 mg by mouth daily.     diphenhydrAMINE  (BENADRYL ) 25 mg capsule Take 1 capsule by mouth at bedtime.     diphenhydrAMINE  (BENADRYL ) 50 MG capsule Take 50 mg by mouth at bedtime as needed.     ezetimibe  (ZETIA ) 10 MG tablet Take 10 mg by mouth daily.     guaiFENesin-codeine 100-10 MG/5ML syrup Take 5-10 mLs by mouth every 4 (four) hours as needed.     HYDROcodone -acetaminophen  (NORCO/VICODIN) 5-325 MG tablet Take 1-2 tablets by mouth every 4 (four) hours as needed for moderate pain (pain score 4-6). 30 tablet 0   isosorbide mononitrate (IMDUR) 30 MG 24 hr tablet Take 1 tablet by mouth every 8 (eight) hours as needed.     ketoconazole  (NIZORAL ) 200 MG tablet Take 1 tablet (200 mg total) by mouth 2 (two) times daily. 60 tablet 5   leuprolide  (LUPRON ) 22.5 MG injection Inject 22.5 mg into the muscle every 3 (three) months.     metoprolol  tartrate (LOPRESSOR ) 25 MG tablet Take 0.5 tablets (12.5 mg total) by mouth 2 (two) times daily. 120 tablet 1   morphine  (MS CONTIN ) 30 MG 12 hr tablet Take 1 tablet (30 mg total) by mouth every 12 (twelve) hours. 60 tablet 0   Multiple Vitamins-Minerals (MULTIVITAMIN WITH MINERALS) tablet Take 1 tablet by mouth daily.     nitroGLYCERIN  (NITROSTAT ) 0.4 MG SL tablet Place 0.4 mg under the tongue every 5 (five) minutes as needed for chest pain. Repeat every 5 minutes if needed for a total of 3 tablets in 15 minutes. If no relief, CALL 911.     Omega-3 Fatty Acids (FISH OIL) 1000 MG CAPS Take 2,000 mg by mouth in the morning and at bedtime.     ondansetron  (ZOFRAN ) 8 MG tablet Take 8 mg by mouth every 8 (eight) hours as needed for nausea or vomiting.     polyethylene glycol powder (GLYCOLAX/MIRALAX) 17 GM/SCOOP powder Take 1 Container by mouth once.     potassium chloride  (KLOR-CON  M) 10 MEQ  tablet TAKE 1 TABLET(10 MEQ) BY MOUTH TWICE DAILY 180 tablet 3   predniSONE  (DELTASONE ) 5 MG tablet Take 1 tablet (5 mg total) by mouth 2 (two) times  daily with a meal. 60 tablet 5   prochlorperazine  (COMPAZINE ) 10 MG tablet Take 1 tablet (10 mg total) by mouth every 6 (six) hours as needed for nausea or vomiting. 30 tablet 2   Royal Jelly-Bee Pollen-Ginseng (KOREAN GINSENG COMPLEX) 150-250-50 MG CAPS Take 1 capsule by mouth daily.     triamcinolone  (KENALOG ) 0.025 % cream Apply 1 Application topically 2 (two) times daily. 30 g 5   Zoledronic  Acid (ZOMETA ) 4 MG/100ML IVPB Inject 4 mg into the vein every 3 (three) months.     No current facility-administered medications for this visit.   Facility-Administered Medications Ordered in Other Visits  Medication Dose Route Frequency Provider Last Rate Last Admin   sodium chloride  flush (NS) 0.9 % injection 10 mL  10 mL Intravenous PRN Cornelius [facility] DEL, MD   10 mL at 03/17/23 0953   sodium chloride  flush (NS) 0.9 % injection 10 mL  10 mL Intracatheter PRN Cornelius [facility] DEL, MD   10 mL at 06/15/23 1234   sodium chloride  flush (NS) 0.9 % injection 10 mL  10 mL Intravenous PRN Cornelius [facility] DEL, MD   10 mL at 07/06/23 9071   sodium chloride  flush (NS) 0.9 % injection 10 mL  10 mL Intravenous PRN Cornelius Marvin DEL, MD   10 mL at 07/27/23 1114   sodium chloride  flush (NS) 0.9 % injection 10 mL  10 mL Intracatheter PRN Cornelius [facility] DEL, MD   10 mL at 09/14/23 9046    I,Fransico Sciandra H Knoxx Boeding,acting as a scribe for Marvin DEL Cornelius, MD.,have documented all relevant documentation on the behalf of Marvin DEL Cornelius, MD,as directed by  Marvin DEL Cornelius, MD while in the presence of Marvin DEL Cornelius, MD.

## 2023-12-21 NOTE — Telephone Encounter (Signed)
 Patient has been scheduled for follow-up visit per 12/21/23 LOS.  Pt given an appt calendar with date and time.

## 2023-12-23 ENCOUNTER — Other Ambulatory Visit: Payer: Self-pay

## 2023-12-23 DIAGNOSIS — C61 Malignant neoplasm of prostate: Secondary | ICD-10-CM

## 2023-12-23 MED ORDER — KETOCONAZOLE 200 MG PO TABS: 200.0000 mg | ORAL_TABLET | Freq: Two times a day (BID) | ORAL | 5 refills | Status: DC

## 2023-12-26 ENCOUNTER — Other Ambulatory Visit: Payer: Self-pay | Admitting: Nurse Practitioner

## 2023-12-26 DIAGNOSIS — C61 Malignant neoplasm of prostate: Secondary | ICD-10-CM

## 2023-12-26 DIAGNOSIS — Z7969 Long term (current) use of other immunomodulators and immunosuppressants: Secondary | ICD-10-CM

## 2023-12-28 ENCOUNTER — Encounter: Payer: Self-pay | Admitting: Oncology

## 2023-12-29 ENCOUNTER — Other Ambulatory Visit: Payer: Self-pay

## 2023-12-29 ENCOUNTER — Encounter: Payer: Self-pay | Admitting: Oncology

## 2023-12-29 DIAGNOSIS — C61 Malignant neoplasm of prostate: Secondary | ICD-10-CM

## 2023-12-29 DIAGNOSIS — Z7969 Long term (current) use of other immunomodulators and immunosuppressants: Secondary | ICD-10-CM

## 2023-12-29 MED ORDER — PREDNISONE 5 MG PO TABS: 5.0000 mg | ORAL_TABLET | Freq: Two times a day (BID) | ORAL | 5 refills | Status: DC

## 2024-01-09 ENCOUNTER — Other Ambulatory Visit: Payer: Self-pay

## 2024-01-09 DIAGNOSIS — M898X9 Other specified disorders of bone, unspecified site: Secondary | ICD-10-CM

## 2024-01-09 DIAGNOSIS — C7951 Secondary malignant neoplasm of bone: Secondary | ICD-10-CM

## 2024-01-10 ENCOUNTER — Encounter: Payer: Self-pay | Admitting: Oncology

## 2024-01-10 MED ORDER — MORPHINE SULFATE ER 30 MG PO TBCR
30.0000 mg | EXTENDED_RELEASE_TABLET | Freq: Two times a day (BID) | ORAL | 0 refills | Status: DC
Start: 2024-01-10 — End: 2024-02-06

## 2024-01-24 ENCOUNTER — Encounter: Payer: Self-pay | Admitting: Oncology

## 2024-01-24 ENCOUNTER — Inpatient Hospital Stay: Attending: Oncology | Admitting: Oncology

## 2024-01-24 ENCOUNTER — Inpatient Hospital Stay

## 2024-01-24 VITALS — BP 121/72 | HR 73 | Temp 97.7°F | Resp 18 | Ht 67.0 in | Wt 178.6 lb

## 2024-01-24 DIAGNOSIS — C61 Malignant neoplasm of prostate: Secondary | ICD-10-CM | POA: Diagnosis not present

## 2024-01-24 DIAGNOSIS — C7800 Secondary malignant neoplasm of unspecified lung: Secondary | ICD-10-CM | POA: Diagnosis not present

## 2024-01-24 LAB — CBC WITH DIFFERENTIAL (CANCER CENTER ONLY)
Abs Immature Granulocytes: 0.16 K/uL — ABNORMAL HIGH (ref 0.00–0.07)
Basophils Absolute: 0 K/uL (ref 0.0–0.1)
Basophils Relative: 0 %
Eosinophils Absolute: 0 K/uL (ref 0.0–0.5)
Eosinophils Relative: 0 %
HCT: 38.4 % — ABNORMAL LOW (ref 39.0–52.0)
Hemoglobin: 12.1 g/dL — ABNORMAL LOW (ref 13.0–17.0)
Immature Granulocytes: 2 %
Lymphocytes Relative: 29 %
Lymphs Abs: 2.9 K/uL (ref 0.7–4.0)
MCH: 27.5 pg (ref 26.0–34.0)
MCHC: 31.5 g/dL (ref 30.0–36.0)
MCV: 87.3 fL (ref 80.0–100.0)
Monocytes Absolute: 0.7 K/uL (ref 0.1–1.0)
Monocytes Relative: 7 %
Neutro Abs: 6.2 K/uL (ref 1.7–7.7)
Neutrophils Relative %: 62 %
Platelet Count: 264 K/uL (ref 150–400)
RBC: 4.4 MIL/uL (ref 4.22–5.81)
RDW: 15.3 % (ref 11.5–15.5)
WBC Count: 10.1 K/uL (ref 4.0–10.5)
nRBC: 0 % (ref 0.0–0.2)

## 2024-01-24 LAB — CMP (CANCER CENTER ONLY)
ALT: 17 U/L (ref 0–44)
AST: 49 U/L — ABNORMAL HIGH (ref 15–41)
Albumin: 4.2 g/dL (ref 3.5–5.0)
Alkaline Phosphatase: 339 U/L — ABNORMAL HIGH (ref 38–126)
Anion gap: 11 (ref 5–15)
BUN: 17 mg/dL (ref 8–23)
CO2: 25 mmol/L (ref 22–32)
Calcium: 9 mg/dL (ref 8.9–10.3)
Chloride: 102 mmol/L (ref 98–111)
Creatinine: 0.8 mg/dL (ref 0.61–1.24)
GFR, Estimated: >60 mL/min (ref >60)
Glucose, Bld: 99 mg/dL (ref 70–99)
Potassium: 4.4 mmol/L (ref 3.5–5.1)
Sodium: 138 mmol/L (ref 135–145)
Total Bilirubin: 0.4 mg/dL (ref 0.0–1.2)
Total Protein: 7.1 g/dL (ref 6.5–8.1)

## 2024-01-24 LAB — PSA: Prostatic Specific Antigen: >1500 ng/mL — ABNORMAL HIGH (ref 0.00–4.00)

## 2024-01-24 NOTE — Progress Notes (Signed)
 [facility]  [address] [place],  [place]  [address] [phone]    Clinic Day: 01/24/24  Referring physician: Paniagua, Tiziana, MD  ASSESSMENT & PLAN:  Assessment: Malignant neoplasm of prostate [facility]) Stage IVA prostate cancer diagnosed in January 2016.  He had progressive disease in the bone and lung in September 2023, so was placed on docetaxel  chemotherapy and continued leuprolide  and zoledronic  acid.  His PSA went down from 536 to 400.75 after 3 cycles, then to 284 with the 5th cycle, and then 203 in March 2024. PSA then went up to 261 in May. We decided to give him a break from chemotherapy at that time.  We continued leuprolide  and zolendronic acid every 3 months. The PSA was 1156. PSMA PET in September 2024 revealed progression of skeletal metastasis. Multiple new radiotracer avid lesions within the spine. Individual lesions are increased in size compared to prior accompanied by sclerotic CT change. 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 No evidence of nodal metastasis or visceral metastasis. He was receiving palliative chemotherapy with cabazitaxel /prednisone  until June, 2025. It was then stopped due to significantly rising PSA. PSA was 426 in early April. His PSA in June increased from 822.82 to 1,013.69 within a month, and then to 1,061 one month later, now over 1,500 as of August. PET scan done on 10/26/2023 which revealed his activity within the multiple skeletal lesions is slightly decreased; however, the individual lesions are slightly expanded in radiotracer activity tissue involvement. No new foci of disease, no lymphadenopathy or visceral metastasis, and the overall minimal interval change in widespread metastatic prostate skeletal disease. We will continue Lupron  injections and we have him on a trial of Ketoconazole  since he has been through almost everything else, except for Pluvicto. We will consider this when he becomes more symptomatic.      Secondary malignant neoplasm of bone and bone marrow (HCC) He continues zoledronic  acid every 3 months in addition to his cancer therapy.  We have stopped chemotherapy with cabazitaxel /prednisone .  His last dose of zoledronic  acid was in early September and he will be due again in December, 2025. He has had some escalation of his pain but downplays it, although he is doing better with the increased MS Contin  30 mg Q12hrs.    Abnormal radiologic findings on diagnostic imaging of renal pelvis, ureter, or bladder CTA abdomen and CT urogram were done in the ER at [facility] last month to evaluate hematuria.  These revealed increased bilateral ureteral mucosal enhancement with periureteral fat stranding, which may reflect underlying urinary tract infection.. No evidence of pyelonephritis. Mild bilateral hydroureter, left greater than right, with a possible enhancing 3 mm lesion within the mid right ureter. No urinary tract calculi. Limited evaluation of the bladder due to decompressed state. Possible asymmetric right inferolateral bladder wall thickening. Cystoscopy was negative by Dr. Lyell, urology at [facility]. She performed cystoscopy, bilateral retrograde pyelogram, bilateral ureteroscopy and with ureteral stent placement in May.  Evaluation was negative.  The ureteral stent was removed.   Anemia He had an upper GI hemorrhage earlier this year and was referred to Dr. Larene. Fortunately, he just had the one episode and was transfused. His hemoglobin came up to 11.1 and has remained stable. He had a EGD scheduled in April, but it was canceled. His cardiologist Dr. Court was not comfortable with him stopping clopidogrel  for another year. His bleeding has stopped and so we will not pursue an EGD at this time unless he begins  having melena again and/or a significant drop in his hemoglobin.  He did have some blood loss with hematuria in the spring but his hemoglobin did come up to 12.4 then it went  down to 11.8 and now it is back up to 12.1 today.     Plan: He states that his MS contin  30 mg has significantly helped his pain and he takes about 3-4 tablets per day of tylenol  in between. He had his last PET scan in August and we discussed the option of starting Plucvito at [facility]. I reassured him that I think he can tolerate this treatment but would need to have an additional PET scan before starting Plucvito, so I will hold off on repeat PET scans for now. We will look into scheduling a consultation in January. He has a WBC of 10.1, low hemoglobin of 12.1 improved from 11.8, and platelet count of 264,000. His CMP is normal other than an elevated AST of 49 up from 33 and his alkaline phosphatase has been steadily increasing from 130 to 255 to 302, and now today at 339. His PSA today is pending but I will call him with the result. However, his last 3 readings were >1,500.00. It is difficult to know exactly how much of an increase or decrease this is if >1,5000. I will see him back in 4 weeks with CBC, CMP, PSA, and port flush. He will be due for his Zometa  and Lupron  injection in December. The patient understands and agrees with this plan of care. He knows to call if he has concerns sooner.  I provided 13 minutes of face-to-face time during this encounter and > 50% was spent counseling as documented under my assessment and plan.   [name], MD  [facility] - A DEPT OF [facility] [address] [place] [address] Dept: [phone] Dept Fax: [phone]   No orders of the defined types were placed in this encounter.   CHIEF COMPLAINT:  CC: Stage IVA prostate cancer  Current Treatment: leuprolide  and zoledronic  acid  HISTORY OF PRESENT ILLNESS:   Oncology History  Malignant neoplasm of prostate (HCC)  03/30/2014 Cancer Staging   Staging form: Prostate, AJCC 8th Edition - Clinical stage from 03/30/2014: Stage IVA (cT3b,  cN1, cM0, PSA: 51.8, Grade Group: 4) - Signed by Cornish Wanda VEAR, MD on 11/14/2020 Histopathologic type: Adenocarcinoma, NOS Stage prefix: Initial diagnosis Prostate specific antigen (PSA) range: 20 or greater Gleason primary pattern: 4 Gleason secondary pattern: 4 Gleason score: 8 Histologic grading system: 5 grade system Location of positive needle core biopsies: Beyond primary site Prognostic indicators: May 2016 Robotic prostatectomy with mult. Pos margins, extra prostatic extension, 2/6 pos nodes. Scan June 2016 positive at T10  PSA up to 216.3 by August Placed on lupron  and casodex Stage used in treatment planning: Yes National guidelines used in treatment planning: Yes Type of national guideline used in treatment planning: NCCN   02/18/2020 Initial Diagnosis   Malignant neoplasm of prostate (HCC)   12/01/2021 - 07/19/2022 Chemotherapy   Patient is on Treatment Plan : PROSTATE Docetaxel  (75) + Prednisone  q21d     12/21/2022 - 08/26/2023 Chemotherapy   Patient is on Treatment Plan : PROSTATE Cabazitaxel  (20) D1 + Prednisone  D1-21 q21d     Secondary malignant neoplasm of bone and bone marrow (HCC)  02/18/2020 Initial Diagnosis   Secondary malignant neoplasm of bone and bone marrow (HCC)   12/01/2021 - 07/19/2022 Chemotherapy  Patient is on Treatment Plan : PROSTATE Docetaxel  (75) + Prednisone  q21d     12/21/2022 - 08/26/2023 Chemotherapy   Patient is on Treatment Plan : PROSTATE Cabazitaxel  (20) D1 + Prednisone  D1-21 q21d     Malignant neoplasm of prostate metastatic to lung (HCC)  11/25/2021 Initial Diagnosis   Malignant neoplasm of prostate metastatic to lung [facility])   12/21/2022 - 08/26/2023 Chemotherapy   Patient is on Treatment Plan : PROSTATE Cabazitaxel  (20) D1 + Prednisone  D1-21 q21d       INTERVAL HISTORY:  Marvin Johnson is here today for repeat clinical assessment for his stage IV prostate cancer. Patient states that he feels well and has no complaints of pain. He notes having  some slight congestion that has resolved last night. He states that his MS contin  30 mg has significantly helped his pain and he takes about 3-4 tablets per day of tylenol  in between. He had his last PET scan in August and we discussed the option of starting Plucvito at [facility]. I reassured him that I think he can tolerate this treatment but would need to have an additional PET scan before starting Plucvito, so I will hold off on repeat PET scans for now. We will look into scheduling a consultation in January. He has a WBC of 10.1, low hemoglobin of 12.1 improved from 11.8, and platelet count of 264,000. His CMP is normal other than an elevated SAT of 49 up from 33 and his alkaline phosphatase has been steadily increasing from 130 to 255 to 302, and now today at 339. His PSA today is pending but I will call him with the result. However, his last 3 readings were >1,500.00. It is difficult to know exactly how much of an increase or decrease this is if >1,500. I will see him back in 4 weeks with CBC, CMP, PSA, and port flush. He will be due for his Zometa  and Lupron  injection in December. He denies fever, chills, night sweats, or other signs of infection. He denies cardiorespiratory and gastrointestinal issues. His appetite is good and His weight has increased 1 pounds over last month.    REVIEW OF SYSTEMS:  Review of Systems  Constitutional:  Positive for fatigue. Negative for appetite change, chills, diaphoresis, fever and unexpected weight change.  HENT:  Negative.  Negative for hearing loss, lump/mass, mouth sores, nosebleeds, sore throat, tinnitus, trouble swallowing and voice change.   Eyes: Negative.  Negative for eye problems and icterus.  Respiratory: Negative.  Negative for chest tightness, cough, hemoptysis, shortness of breath and wheezing.   Cardiovascular: Negative.  Negative for chest pain, leg swelling and palpitations.  Gastrointestinal: Negative.  Negative for abdominal distention, abdominal  pain, blood in stool, constipation, diarrhea, nausea and vomiting.  Endocrine: Negative.  Negative for hot flashes.  Genitourinary: Negative.  Negative for bladder incontinence, difficulty urinating, dyspareunia, dysuria, frequency, hematuria, nocturia, pelvic pain and penile discharge.   Musculoskeletal:  Positive for arthralgias (chronic) and back pain (chronic). Negative for flank pain, gait problem, myalgias, neck pain and neck stiffness.       Pain all over, as usual  Skin:  Negative for itching, rash and wound.  Neurological:  Positive for numbness (of both lower legs). Negative for dizziness, extremity weakness, gait problem, headaches, light-headedness, seizures and speech difficulty.  Hematological: Negative.  Negative for adenopathy. Does not bruise/bleed easily.  Psychiatric/Behavioral: Negative.  Negative for confusion, depression, sleep disturbance and suicidal ideas. The patient is not nervous/anxious.     VITALS:  Blood  pressure 121/72, pulse 73, temperature 97.7 F (36.5 C), temperature source Oral, resp. rate 18, height 5' 7 (1.702 m), weight 178 lb 9.6 oz (81 kg), SpO2 99%.  Wt Readings from Last 3 Encounters:  01/24/24 178 lb 9.6 oz (81 kg)  12/21/23 177 lb 4.8 oz (80.4 kg)  11/23/23 181 lb 4.8 oz (82.2 kg)  Body mass index is 27.97 kg/m.  Performance status (ECOG): 1 - Symptomatic but completely ambulatory  PHYSICAL EXAM:  Physical Exam Vitals and nursing note reviewed.  Constitutional:      General: He is not in acute distress.    Appearance: Normal appearance. He is normal weight. He is not ill-appearing, toxic-appearing or diaphoretic.  HENT:     Head: Normocephalic and atraumatic.     Right Ear: Tympanic membrane, ear canal and external ear normal. There is no impacted cerumen.     Left Ear: Tympanic membrane, ear canal and external ear normal. There is no impacted cerumen.     Nose: Nose normal. No congestion or rhinorrhea.     Mouth/Throat:     Mouth:  Mucous membranes are moist.     Pharynx: Oropharynx is clear. No oropharyngeal exudate or posterior oropharyngeal erythema.  Eyes:     General: No scleral icterus.       Right eye: No discharge.        Left eye: No discharge.     Extraocular Movements: Extraocular movements intact.     Conjunctiva/sclera: Conjunctivae normal.     Pupils: Pupils are equal, round, and reactive to light.  Cardiovascular:     Rate and Rhythm: Normal rate and regular rhythm.     Pulses: Normal pulses.     Heart sounds: Normal heart sounds. No murmur heard.    No friction rub. No gallop.  Pulmonary:     Effort: Pulmonary effort is normal. No respiratory distress.     Breath sounds: Normal breath sounds. No stridor. No wheezing, rhonchi or rales.  Chest:     Chest wall: No tenderness.  Abdominal:     General: Bowel sounds are normal. There is no distension.     Palpations: Abdomen is soft. There is no hepatomegaly, splenomegaly or mass.     Tenderness: There is no abdominal tenderness. There is no right CVA tenderness, left CVA tenderness, guarding or rebound.     Hernia: No hernia is present.  Musculoskeletal:        General: Normal range of motion.     Cervical back: Normal range of motion and neck supple. No tenderness.     Right lower leg: Edema (trace) present.     Left lower leg: Edema (trace) present.  Lymphadenopathy:     Cervical: No cervical adenopathy.     Upper Body:     Right upper body: No supraclavicular or axillary adenopathy.     Left upper body: No supraclavicular or axillary adenopathy.     Lower Body: No right inguinal adenopathy. No left inguinal adenopathy.  Skin:    General: Skin is warm and dry.     Coloration: Skin is not jaundiced or pale.     Findings: No bruising, erythema, lesion or rash.  Neurological:     General: No focal deficit present.     Mental Status: He is alert and oriented to person, place, and time. Mental status is at baseline.     Cranial Nerves: No  cranial nerve deficit.  Psychiatric:        Mood and  Affect: Mood normal.        Behavior: Behavior normal.        Thought Content: Thought content normal.        Judgment: Judgment normal.    LABS:      Latest Ref Rng & Units 01/24/2024    8:21 AM 12/21/2023    8:26 AM 11/23/2023    8:15 AM  CBC  WBC 4.0 - 10.5 K/uL 10.1  8.2  8.2   Hemoglobin 13.0 - 17.0 g/dL 87.8  88.1  87.9   Hematocrit 39.0 - 52.0 % 38.4  38.3  38.5   Platelets 150 - 400 K/uL 264  300  268       Latest Ref Rng & Units 01/24/2024    8:21 AM 12/21/2023    8:26 AM 11/23/2023    8:15 AM  CMP  Glucose 70 - 99 mg/dL 99  847  817   BUN 8 - 23 mg/dL 17  15  15    Creatinine 0.61 - 1.24 mg/dL 9.19  9.06  9.14   Sodium 135 - 145 mmol/L 138  138  139   Potassium 3.5 - 5.1 mmol/L 4.4  3.8  3.6   Chloride 98 - 111 mmol/L 102  100  100   CO2 22 - 32 mmol/L 25  24  22    Calcium 8.9 - 10.3 mg/dL 9.0  8.8  9.3   Total Protein 6.5 - 8.1 g/dL 7.1  7.1  7.0   Total Bilirubin 0.0 - 1.2 mg/dL 0.4  0.3  0.3   Alkaline Phos 38 - 126 U/L 339  302  255   AST 15 - 41 U/L 49  33  31   ALT 0 - 44 U/L 17  17  20     Component Ref Range & Units (hover) 4 wk ago (11/23/23) 2 mo ago (10/14/23) 3 mo ago (09/14/23) 3 mo ago (08/24/23) 4 mo ago (07/27/23) 5 mo ago (07/06/23) 6 mo ago (06/15/23)  Prostatic Specific Antigen >1,500.00 High  >1,500.00 High  CM 1,061.05 High  CM 1,013.69 High  CM 822.82 High  CM 438.43 High  CM 426.49 High  CM   Lab Results  Component Value Date   PSA1 1,362.0 (H) 02/01/2023   PSA1 1,317.0 (H) 01/06/2023   PSA1 334.0 (H) 10/30/2021   Lab Results  Component Value Date   TIBC 398 01/29/2022   FERRITIN 206 01/29/2022   IRONPCTSAT 25 01/29/2022   STUDIES:   EXAM: 10/26/2023 NUCLEAR MEDICINE PET SKULL BASE TO THIGH IMPRESSION: 1. Activity within the multiple skeletal lesions is slightly decreased; however, the individual lesions are slightly expanded in radiotracer activity tissue involvement. Overall  minimal interval change in widespread metastatic prostate skeletal disease. 2. No new foci of disease. 3. No lymphadenopathy or visceral metastasis.  Exam: 06/01/2023 CT ABDOMEN AND PELVIS WITHOUT CONTRAST IMPRESSIONS: Left-sided hyfrouteronephrosis which was not previously present. Wuestionable soft tissue lesion in the distal ureter on the left. There may be a mild amount of stranding around the left kidney. There is some mild stranding around the right ureter of uncertain significance as well. Correlate clinically/with laboratory findings. These findings were not previously present. A CT urogram may provide additional information.     HISTORY:   Past Medical History:  Diagnosis Date   Hyperlipidemia    Hypokalemia 03/24/2023   Malignant neoplasm of prostate (HCC)    Supraventricular tachycardia     Past Surgical History:  Procedure Laterality Date   CORONARY  STENT INTERVENTION N/A 02/08/2022   Procedure: CORONARY STENT INTERVENTION;  Surgeon: Mady Bruckner, MD;  Location: [facility] INVASIVE CV LAB;  Service: Cardiovascular;  Laterality: N/A;   LEFT HEART CATH AND CORONARY ANGIOGRAPHY N/A 02/08/2022   Procedure: LEFT HEART CATH AND CORONARY ANGIOGRAPHY;  Surgeon: Mady Bruckner, MD;  Location: [facility] INVASIVE CV LAB;  Service: Cardiovascular;  Laterality: N/A;   PROSTATECTOMY  07/2014   TONSILLECTOMY      Family History  Problem Relation Age of Onset   Breast cancer Mother 15   Ovarian cancer Mother 87   Kidney Stones Sister    Breast cancer Paternal Grandmother 62    Social History:  reports that he has never smoked. He has never used smokeless tobacco. He reports that he does not currently use alcohol. He reports that he does not use drugs.The patient is alone today.  Allergies:  Allergies  Allergen Reactions   Atorvastatin    Rosuvastatin Other (See Comments)    Other reaction(s): Muscle pain   Simvastatin Other (See Comments)    Other reaction(s): Joint pain, Muscle  pain   Sulfa Antibiotics    Sulfamethoxazole-Trimethoprim Other (See Comments)    Unknown as to interaction was a baby.    Current Medications: Current Outpatient Medications  Medication Sig Dispense Refill   Alirocumab 75 MG/ML SOAJ Inject 75 mg into the skin.     Cholecalciferol 25 MCG (1000 UT) tablet Take 1,000 Units by mouth in the morning and at bedtime.     clopidogrel  (PLAVIX ) 75 MG tablet Take 75 mg by mouth daily.     diphenhydrAMINE  (BENADRYL ) 25 mg capsule Take 1 capsule by mouth at bedtime.     diphenhydrAMINE  (BENADRYL ) 50 MG capsule Take 50 mg by mouth at bedtime as needed.     ezetimibe  (ZETIA ) 10 MG tablet Take 10 mg by mouth daily.     guaiFENesin-codeine 100-10 MG/5ML syrup Take 5-10 mLs by mouth every 4 (four) hours as needed.     HYDROcodone -acetaminophen  (NORCO/VICODIN) 5-325 MG tablet Take 1-2 tablets by mouth every 4 (four) hours as needed for moderate pain (pain score 4-6). 30 tablet 0   isosorbide mononitrate (IMDUR) 30 MG 24 hr tablet Take 1 tablet by mouth every 8 (eight) hours as needed.     ketoconazole  (NIZORAL ) 200 MG tablet Take 1 tablet (200 mg total) by mouth 2 (two) times daily. 60 tablet 5   leuprolide  (LUPRON ) 22.5 MG injection Inject 22.5 mg into the muscle every 3 (three) months.     metoprolol  tartrate (LOPRESSOR ) 25 MG tablet Take 0.5 tablets (12.5 mg total) by mouth 2 (two) times daily. 120 tablet 1   morphine  (MS CONTIN ) 30 MG 12 hr tablet Take 1 tablet (30 mg total) by mouth in the morning, at noon, and at bedtime. 90 tablet 0   Multiple Vitamins-Minerals (MULTIVITAMIN WITH MINERALS) tablet Take 1 tablet by mouth daily.     nitroGLYCERIN  (NITROSTAT ) 0.4 MG SL tablet Place 0.4 mg under the tongue every 5 (five) minutes as needed for chest pain. Repeat every 5 minutes if needed for a total of 3 tablets in 15 minutes. If no relief, CALL 911.     Omega-3 Fatty Acids (FISH OIL) 1000 MG CAPS Take 2,000 mg by mouth in the morning and at bedtime.      ondansetron  (ZOFRAN ) 8 MG tablet Take 8 mg by mouth every 8 (eight) hours as needed for nausea or vomiting.     polyethylene glycol powder (GLYCOLAX/MIRALAX) 17 GM/SCOOP  powder Take 1 Container by mouth once.     potassium chloride  (KLOR-CON  M) 10 MEQ tablet TAKE 1 TABLET(10 MEQ) BY MOUTH TWICE DAILY 180 tablet 3   predniSONE  (DELTASONE ) 5 MG tablet Take 1 tablet (5 mg total) by mouth 2 (two) times daily with a meal. 60 tablet 5   prochlorperazine  (COMPAZINE ) 10 MG tablet Take 1 tablet (10 mg total) by mouth every 6 (six) hours as needed for nausea or vomiting. 30 tablet 2   Royal Jelly-Bee Pollen-Ginseng (KOREAN GINSENG COMPLEX) 150-250-50 MG CAPS Take 1 capsule by mouth daily.     triamcinolone  (KENALOG ) 0.025 % cream Apply 1 Application topically 2 (two) times daily. 30 g 5   Zoledronic  Acid (ZOMETA ) 4 MG/100ML IVPB Inject 4 mg into the vein every 3 (three) months.     No current facility-administered medications for this visit.   Facility-Administered Medications Ordered in Other Visits  Medication Dose Route Frequency Provider Last Rate Last Admin   sodium chloride  flush (NS) 0.9 % injection 10 mL  10 mL Intravenous PRN Cornelius Wanda DEL, MD   10 mL at 03/17/23 0953   sodium chloride  flush (NS) 0.9 % injection 10 mL  10 mL Intracatheter PRN Cornelius Wanda DEL, MD   10 mL at 06/15/23 1234   sodium chloride  flush (NS) 0.9 % injection 10 mL  10 mL Intravenous PRN Cornelius Wanda DEL, MD   10 mL at 07/06/23 9071   sodium chloride  flush (NS) 0.9 % injection 10 mL  10 mL Intravenous PRN Cornelius Wanda DEL, MD   10 mL at 07/27/23 1114   sodium chloride  flush (NS) 0.9 % injection 10 mL  10 mL Intracatheter PRN Cornelius Wanda DEL, MD   10 mL at 09/14/23 9046    I,Jamieon Lannen H Rastus Borton,acting as a scribe for Wanda DEL Cornelius, MD.,have documented all relevant documentation on the behalf of Wanda DEL Cornelius, MD,as directed by  Wanda DEL Cornelius, MD while in the presence of Wanda DEL Cornelius, MD.

## 2024-02-03 ENCOUNTER — Telehealth: Payer: Self-pay

## 2024-02-03 NOTE — Telephone Encounter (Signed)
Called patient  and notified him of message .

## 2024-02-03 NOTE — Telephone Encounter (Signed)
-----   Message from [name] sent at 02/03/2024  1:10 PM EST ----- Regarding: RE: Medication Yes, he can increase his MS Contin  to every 8 hours, we discussed at appt ----- Message ----- From: Janit Jon HERO, LPN Sent: [id number]   9:10 AM EST To: [name], MD Subject: Medication                                     Patient was in clinic with friend. Would like to know if we can up his morning to 3xdaily instead of 2xdaily. He is still having some pain in between.   Jon, LPN

## 2024-02-06 ENCOUNTER — Other Ambulatory Visit: Payer: Self-pay | Admitting: Hematology and Oncology

## 2024-02-06 ENCOUNTER — Telehealth: Payer: Self-pay

## 2024-02-06 ENCOUNTER — Encounter: Payer: Self-pay | Admitting: Oncology

## 2024-02-06 ENCOUNTER — Other Ambulatory Visit: Payer: Self-pay

## 2024-02-06 DIAGNOSIS — C7951 Secondary malignant neoplasm of bone: Secondary | ICD-10-CM

## 2024-02-06 MED ORDER — MORPHINE SULFATE ER 30 MG PO TBCR: 30.0000 mg | EXTENDED_RELEASE_TABLET | Freq: Three times a day (TID) | ORAL | 0 refills | Status: DC

## 2024-02-06 NOTE — Telephone Encounter (Signed)
 Pt called to report he has had nausea since Saturday. He is using the compazine  with relief. He mentioned he was a little constipated today, but he had good BM last night. I asked what he is taking. He replied Senna twice a day. I told him to increase to 2 tab senna BID. He is concerned because he can't eat anything. I don't have no appetite at all. Is there anything they could call in to help me?

## 2024-02-21 ENCOUNTER — Inpatient Hospital Stay

## 2024-02-21 ENCOUNTER — Inpatient Hospital Stay (HOSPITAL_BASED_OUTPATIENT_CLINIC_OR_DEPARTMENT_OTHER): Admitting: Oncology

## 2024-02-21 ENCOUNTER — Inpatient Hospital Stay: Attending: Oncology

## 2024-02-21 VITALS — BP 116/82 | HR 65 | Temp 97.8°F | Resp 16 | Ht 67.0 in | Wt 180.2 lb

## 2024-02-21 DIAGNOSIS — E785 Hyperlipidemia, unspecified: Secondary | ICD-10-CM | POA: Diagnosis not present

## 2024-02-21 DIAGNOSIS — E876 Hypokalemia: Secondary | ICD-10-CM | POA: Insufficient documentation

## 2024-02-21 DIAGNOSIS — Z8041 Family history of malignant neoplasm of ovary: Secondary | ICD-10-CM | POA: Diagnosis not present

## 2024-02-21 DIAGNOSIS — Z79818 Long term (current) use of other agents affecting estrogen receptors and estrogen levels: Secondary | ICD-10-CM | POA: Insufficient documentation

## 2024-02-21 DIAGNOSIS — R7401 Elevation of levels of liver transaminase levels: Secondary | ICD-10-CM | POA: Diagnosis not present

## 2024-02-21 DIAGNOSIS — Z79891 Long term (current) use of opiate analgesic: Secondary | ICD-10-CM | POA: Insufficient documentation

## 2024-02-21 DIAGNOSIS — Z9221 Personal history of antineoplastic chemotherapy: Secondary | ICD-10-CM | POA: Diagnosis not present

## 2024-02-21 DIAGNOSIS — Z803 Family history of malignant neoplasm of breast: Secondary | ICD-10-CM | POA: Diagnosis not present

## 2024-02-21 DIAGNOSIS — C7951 Secondary malignant neoplasm of bone: Secondary | ICD-10-CM | POA: Diagnosis not present

## 2024-02-21 DIAGNOSIS — C61 Malignant neoplasm of prostate: Secondary | ICD-10-CM

## 2024-02-21 DIAGNOSIS — I471 Supraventricular tachycardia, unspecified: Secondary | ICD-10-CM | POA: Diagnosis not present

## 2024-02-21 DIAGNOSIS — D649 Anemia, unspecified: Secondary | ICD-10-CM | POA: Insufficient documentation

## 2024-02-21 DIAGNOSIS — K529 Noninfective gastroenteritis and colitis, unspecified: Secondary | ICD-10-CM | POA: Diagnosis not present

## 2024-02-21 DIAGNOSIS — Z7902 Long term (current) use of antithrombotics/antiplatelets: Secondary | ICD-10-CM | POA: Insufficient documentation

## 2024-02-21 DIAGNOSIS — R9721 Rising PSA following treatment for malignant neoplasm of prostate: Secondary | ICD-10-CM | POA: Insufficient documentation

## 2024-02-21 DIAGNOSIS — Z7983 Long term (current) use of bisphosphonates: Secondary | ICD-10-CM | POA: Insufficient documentation

## 2024-02-21 DIAGNOSIS — Z79899 Other long term (current) drug therapy: Secondary | ICD-10-CM | POA: Insufficient documentation

## 2024-02-21 DIAGNOSIS — E860 Dehydration: Secondary | ICD-10-CM | POA: Diagnosis not present

## 2024-02-21 DIAGNOSIS — N134 Hydroureter: Secondary | ICD-10-CM | POA: Insufficient documentation

## 2024-02-21 DIAGNOSIS — C7800 Secondary malignant neoplasm of unspecified lung: Secondary | ICD-10-CM | POA: Diagnosis not present

## 2024-02-21 DIAGNOSIS — Z7952 Long term (current) use of systemic steroids: Secondary | ICD-10-CM | POA: Diagnosis not present

## 2024-02-21 LAB — CMP (CANCER CENTER ONLY)
ALT: 15 U/L (ref 0–44)
AST: 47 U/L — ABNORMAL HIGH (ref 15–41)
Albumin: 3.8 g/dL (ref 3.5–5.0)
Alkaline Phosphatase: 516 U/L — ABNORMAL HIGH (ref 38–126)
Anion gap: 10 (ref 5–15)
BUN: 15 mg/dL (ref 8–23)
CO2: 27 mmol/L (ref 22–32)
Calcium: 8.9 mg/dL (ref 8.9–10.3)
Chloride: 101 mmol/L (ref 98–111)
Creatinine: 0.8 mg/dL (ref 0.61–1.24)
GFR, Estimated: >60 mL/min (ref >60)
Glucose, Bld: 137 mg/dL — ABNORMAL HIGH (ref 70–99)
Potassium: 4.3 mmol/L (ref 3.5–5.1)
Sodium: 138 mmol/L (ref 135–145)
Total Bilirubin: 0.3 mg/dL (ref 0.0–1.2)
Total Protein: 6.3 g/dL — ABNORMAL LOW (ref 6.5–8.1)

## 2024-02-21 LAB — CBC WITH DIFFERENTIAL (CANCER CENTER ONLY)
Abs Immature Granulocytes: 0.16 K/uL — ABNORMAL HIGH (ref 0.00–0.07)
Basophils Absolute: 0 K/uL (ref 0.0–0.1)
Basophils Relative: 1 %
Eosinophils Absolute: 0.1 K/uL (ref 0.0–0.5)
Eosinophils Relative: 1 %
HCT: 35.5 % — ABNORMAL LOW (ref 39.0–52.0)
Hemoglobin: 10.9 g/dL — ABNORMAL LOW (ref 13.0–17.0)
Immature Granulocytes: 2 %
Lymphocytes Relative: 31 %
Lymphs Abs: 2.5 K/uL (ref 0.7–4.0)
MCH: 27.6 pg (ref 26.0–34.0)
MCHC: 30.7 g/dL (ref 30.0–36.0)
MCV: 89.9 fL (ref 80.0–100.0)
Monocytes Absolute: 0.6 K/uL (ref 0.1–1.0)
Monocytes Relative: 8 %
Neutro Abs: 4.6 K/uL (ref 1.7–7.7)
Neutrophils Relative %: 57 %
Platelet Count: 229 K/uL (ref 150–400)
RBC: 3.95 MIL/uL — ABNORMAL LOW (ref 4.22–5.81)
RDW: 17.1 % — ABNORMAL HIGH (ref 11.5–15.5)
WBC Count: 8 K/uL (ref 4.0–10.5)
nRBC: 0.3 % — ABNORMAL HIGH (ref 0.0–0.2)

## 2024-02-21 LAB — PSA: Prostatic Specific Antigen: >1500 ng/mL — ABNORMAL HIGH (ref 0.00–4.00)

## 2024-02-21 MED ORDER — SODIUM CHLORIDE 0.9 % IV SOLN: Freq: Once | INTRAVENOUS | Status: AC

## 2024-02-21 MED ORDER — LEUPROLIDE ACETATE (3 MONTH) 22.5 MG IM KIT
22.5000 mg | PACK | Freq: Once | INTRAMUSCULAR | Status: AC
  Administered 2024-02-21: 22.5 mg via INTRAMUSCULAR
  Filled 2024-02-21: qty 22.5

## 2024-02-21 MED ORDER — ZOLEDRONIC ACID 4 MG/100ML IV SOLN
4.0000 mg | Freq: Once | INTRAVENOUS | Status: AC
  Administered 2024-02-21: 4 mg via INTRAVENOUS
  Filled 2024-02-21: qty 100

## 2024-02-21 NOTE — Progress Notes (Signed)
 [facility]  [address] [place],  [place]  [address] [phone]   Clinic Day: 02/21/2024  Referring physician: Paniagua, Tiziana, MD  ASSESSMENT & PLAN:  Assessment: Malignant neoplasm of prostate [facility]) Stage IVA prostate cancer diagnosed in January 2016.  He had progressive disease in the bone and lung in September 2023, so was placed on docetaxel  chemotherapy and continued leuprolide  and zoledronic  acid.  His PSA went down from 536 to 400.75 after 3 cycles, then to 284 with the 5th cycle, and then 203 in March 2024. PSA then went up to 261 in May. We decided to give him a break from chemotherapy at that time.  We continued leuprolide  and zolendronic acid every 3 months. The PSA was 1156 in August of 2024. PSMA PET in September 2024 revealed progression of skeletal metastasis. He was placed on palliative chemotherapy with cabazitaxel /prednisone  until June, 2025. His PSA went steadily down until March when it was 371. It then started rising, to 822.82 in June 2025 and then to 1,013.69 within a month, and then to 1,061 one month later. It has now been over 1,500 since August 2025 and I do not have specific readings. PET scan done on 10/26/2023 revealed his activity within the multiple skeletal lesions is slightly decreased; however, the individual lesions are slightly expanded in radiotracer activity tissue involvement. No new foci of disease, no lymphadenopathy or visceral metastasis, and the overall minimal interval change in widespread metastatic prostate skeletal disease. We will continue Lupron  injections and we have him on a trial of Ketoconazole  since he has been through almost everything else, except for Pluvicto. We will consider this when he becomes more symptomatic.     Secondary malignant neoplasm of bone and bone marrow (HCC) He continues zoledronic  acid every 3 months in addition to his cancer therapy.  We have stopped chemotherapy in June 2025 with  cabazitaxel /prednisone  due to progression of disease. He continues zoledronic  acid every 3 months and is due again today, 02/21/2024. He had some escalation of his pain last month, and we increased his MS Contin  to 30 mg every 8 hours and it has remained controlled.   Abnormal radiologic findings on diagnostic imaging of renal pelvis, ureter, or bladder CTA abdomen and CT urogram were done in the ER at [facility] last Spring to evaluate hematuria.  These revealed increased bilateral ureteral mucosal enhancement with mild bilateral hydroureter, left greater than right, with a possible enhancing 3 mm lesion within the mid right ureter. Possible asymmetric right inferolateral bladder wall thickening. Evaluation with cystoscopy was negative by Dr. Lyell, urology at [facility]. She performed cystoscopy, bilateral retrograde pyelogram, bilateral ureteroscopy and with ureteral stent placement in May.  Evaluation was negative.  The ureteral stent was later removed.   Anemia He had an upper GI hemorrhage in early 2025 and was referred to Dr. Larene. Fortunately, he just had the one episode and was transfused. His hemoglobin came up to 11.1 and has remained stable. He had a EGD scheduled in April, but it was canceled. His cardiologist Dr. Court was not comfortable with him stopping clopidogrel  for another year. His bleeding has stopped and so we did not pursue an EGD at that time. His hemoglobin did come up to 12.4 then was relatively stable. It is down to 10.9 today. He denies any overt bleeding.   Plan: He feels well, but complains of increased fatigue and has no complaints of pain. He reports that his pain is well controlled with MS  Contin 30 mg TID. He continues Lupron  and Zometa  every 3 months and will receive these today. We discussed the option of referring for Pluvicto at [facility]. He has a WBC of 8.0, a low hemoglobin of 10.9 down from 12.1, and platelet count of 229,000. His CMP revealed a low total  protein of 6.3 down from 7.1, an elevated AST of 47 improved from 49, and an elevated alkaline phosphatase of 516 up from 339. This has been steadily rising. His PSA is pending, but has remained over 1,500 for months now. I encouraged him to increase protein in his diet. I will see him back in 6 weeks with CBC, CMP, and PSA. We will decide on referral for Pluvicto at [facility] at that time. We will hold off on repeat PSMA PET scan since he clinically remains stable and we would need that prior to starting any new therapy. The patient understands and agrees with this plan of care. He knows to call if he has concerns sooner.  I provided 12 minutes of face-to-face time during this encounter and > 50% was spent counseling as documented under my assessment and plan.   [name], MD  [facility] - A DEPT OF [facility] [address] [place] [address] Dept: [phone] Dept Fax: [phone]   No orders of the defined types were placed in this encounter.   CHIEF COMPLAINT:  CC: Stage IVA prostate cancer  Current Treatment: leuprolide  and zoledronic  acid  HISTORY OF PRESENT ILLNESS:   Oncology History  Malignant neoplasm of prostate (HCC)  03/30/2014 Cancer Staging   Staging form: Prostate, AJCC 8th Edition - Clinical stage from 03/30/2014: Stage IVA (cT3b, cN1, cM0, PSA: 51.8, Grade Group: 4) - Signed by Cornish Wanda VEAR, MD on 11/14/2020 Histopathologic type: Adenocarcinoma, NOS Stage prefix: Initial diagnosis Prostate specific antigen (PSA) range: 20 or greater Gleason primary pattern: 4 Gleason secondary pattern: 4 Gleason score: 8 Histologic grading system: 5 grade system Location of positive needle core biopsies: Beyond primary site Prognostic indicators: May 2016 Robotic prostatectomy with mult. Pos margins, extra prostatic extension, 2/6 pos nodes. Scan June 2016 positive at T10  PSA up to 216.3 by August Placed  on lupron  and casodex Stage used in treatment planning: Yes National guidelines used in treatment planning: Yes Type of national guideline used in treatment planning: NCCN   02/18/2020 Initial Diagnosis   Malignant neoplasm of prostate (HCC)   12/01/2021 - 07/19/2022 Chemotherapy   Patient is on Treatment Plan : PROSTATE Docetaxel  (75) + Prednisone  q21d     12/21/2022 - 08/26/2023 Chemotherapy   Patient is on Treatment Plan : PROSTATE Cabazitaxel  (20) D1 + Prednisone  D1-21 q21d     Secondary malignant neoplasm of bone and bone marrow (HCC)  02/18/2020 Initial Diagnosis   Secondary malignant neoplasm of bone and bone marrow (HCC)   12/01/2021 - 07/19/2022 Chemotherapy   Patient is on Treatment Plan : PROSTATE Docetaxel  (75) + Prednisone  q21d     12/21/2022 - 08/26/2023 Chemotherapy   Patient is on Treatment Plan : PROSTATE Cabazitaxel  (20) D1 + Prednisone  D1-21 q21d     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 Patient states that he feels well, but complains of increased fatigue and has no complaints of pain. He reports that his pain is well controlled with MS Contin  30 mg TID. He continues Lupron  and Zometa  every 3 months and will receive these today. We discussed the option of referring for Pluvicto at [facility]. He has a WBC of 8.0, a low hemoglobin of 10.9 down from 12.1, and platelet count of 229,000. His CMP revealed a low total protein of 6.3 down from 7.1, an elevated AST of 47 improved from 49, and an elevated alkaline phosphatase of 516 up from 339. This has been steadily rising. His PSA is pending, but has remained over 1,500 for months now. I encouraged him  to increase protein in his diet. I will see him back in 6 weeks with CBC, CMP, and PSA. We will decide on referral for Pluvicto at [facility] at that time. We will hold off on repeat PSMA PET scan since he clinically remains stable and we would need that prior to starting any new therapy.  He denies fever, chills, night sweats, or other signs of infection. He denies cardiorespiratory and gastrointestinal issues. He  denies pain. His appetite is good and His weight has increased 2 pounds over last 4 weeks.   REVIEW OF SYSTEMS:  Review of Systems  Constitutional:  Positive for fatigue. Negative for appetite change, chills, diaphoresis, fever and unexpected weight change.  HENT:  Negative.  Negative for hearing loss, lump/mass, mouth sores, nosebleeds, sore throat, tinnitus, trouble swallowing and voice change.   Eyes: Negative.  Negative for eye problems and icterus.  Respiratory: Negative.  Negative for chest tightness, cough, hemoptysis, shortness of breath and wheezing.   Cardiovascular: Negative.  Negative for chest pain, leg swelling and palpitations.  Gastrointestinal: Negative.  Negative for abdominal distention, abdominal pain, blood in stool, constipation, diarrhea, nausea and vomiting.  Endocrine: Negative.  Negative for hot flashes.  Genitourinary: Negative.  Negative for bladder incontinence, difficulty urinating, dyspareunia, dysuria, frequency, hematuria, nocturia, pelvic pain and penile discharge.   Musculoskeletal:  Positive for arthralgias (chronic, well-controlled) and back pain (chronic, well-controlled). Negative for flank pain, gait problem, myalgias, neck pain and neck stiffness.  Skin:  Negative for itching, rash and wound.  Neurological:  Positive for numbness (of both lower legs). Negative for dizziness, extremity weakness, gait problem, headaches, light-headedness, seizures and speech difficulty.  Hematological: Negative.  Negative for adenopathy. Does not bruise/bleed easily.   Psychiatric/Behavioral: Negative.  Negative for confusion, depression, sleep disturbance and suicidal ideas. The patient is not nervous/anxious.     VITALS:  Blood pressure 116/82, pulse 65, temperature 97.8 F (36.6 C), temperature source Oral, resp. rate 16, height 5' 7 (1.702 m), weight 180 lb 3.2 oz (81.7 kg), SpO2 97%.  Wt Readings from Last 3 Encounters:  02/21/24 180 lb 3.2 oz (81.7 kg)  01/24/24 178 lb 9.6 oz (81 kg)  12/21/23 177 lb 4.8 oz (80.4 kg)  Body mass index is 28.22 kg/m.  Performance status (ECOG): 1 - Symptomatic but completely ambulatory  PHYSICAL EXAM:  Physical Exam Vitals and nursing note reviewed.  Constitutional:      General: He is not in acute distress.    Appearance: Normal appearance. He is normal weight. He is not ill-appearing, toxic-appearing or diaphoretic.  HENT:     Head: Normocephalic and atraumatic.     Right Ear: Tympanic membrane, ear canal and external ear normal. There is no  impacted cerumen.     Left Ear: Tympanic membrane, ear canal and external ear normal. There is no impacted cerumen.     Nose: Nose normal. No congestion or rhinorrhea.     Mouth/Throat:     Mouth: Mucous membranes are moist.     Pharynx: Oropharynx is clear. No oropharyngeal exudate or posterior oropharyngeal erythema.  Eyes:     General: No scleral icterus.       Right eye: No discharge.        Left eye: No discharge.     Extraocular Movements: Extraocular movements intact.     Conjunctiva/sclera: Conjunctivae normal.     Pupils: Pupils are equal, round, and reactive to light.  Cardiovascular:     Rate and Rhythm: Normal rate and regular rhythm.     Pulses: Normal pulses.     Heart sounds: Normal heart sounds. No murmur heard.    No friction rub. No gallop.  Pulmonary:     Effort: Pulmonary effort is normal. No respiratory distress.     Breath sounds: Normal breath sounds. No stridor. No wheezing, rhonchi or rales.  Chest:     Chest wall: No tenderness.   Abdominal:     General: Bowel sounds are normal. There is no distension.     Palpations: Abdomen is soft. There is no hepatomegaly, splenomegaly or mass.     Tenderness: There is no abdominal tenderness. There is no right CVA tenderness, left CVA tenderness, guarding or rebound.     Hernia: No hernia is present.  Musculoskeletal:        General: Normal range of motion.     Cervical back: Normal range of motion and neck supple. No tenderness.     Right lower leg: Edema (trace) present.     Left lower leg: Edema (trace) present.  Lymphadenopathy:     Cervical: No cervical adenopathy.     Upper Body:     Right upper body: No supraclavicular or axillary adenopathy.     Left upper body: No supraclavicular or axillary adenopathy.     Lower Body: No right inguinal adenopathy. No left inguinal adenopathy.  Skin:    General: Skin is warm and dry.     Coloration: Skin is not jaundiced or pale.     Findings: No bruising, erythema, lesion or rash.  Neurological:     General: No focal deficit present.     Mental Status: He is alert and oriented to person, place, and time. Mental status is at baseline.     Cranial Nerves: No cranial nerve deficit.  Psychiatric:        Mood and Affect: Mood normal.        Behavior: Behavior normal.        Thought Content: Thought content normal.        Judgment: Judgment normal.    LABS:      Latest Ref Rng & Units 02/21/2024    7:58 AM 01/24/2024    8:21 AM 12/21/2023    8:26 AM  CBC  WBC 4.0 - 10.5 K/uL 8.0  10.1  8.2   Hemoglobin 13.0 - 17.0 g/dL 89.0  87.8  88.1   Hematocrit 39.0 - 52.0 % 35.5  38.4  38.3   Platelets 150 - 400 K/uL 229  264  300       Latest Ref Rng & Units 02/21/2024    7:58 AM 01/24/2024    8:21 AM 12/21/2023    8:26 AM  CMP  Glucose 70 - 99 mg/dL 862  99  847   BUN 8 - 23 mg/dL 15  17  15    Creatinine 0.61 - 1.24 mg/dL 9.19  9.19  9.06   Sodium 135 - 145 mmol/L 138  138  138   Potassium 3.5 - 5.1 mmol/L 4.3  4.4  3.8    Chloride 98 - 111 mmol/L 101  102  100   CO2 22 - 32 mmol/L 27  25  24    Calcium 8.9 - 10.3 mg/dL 8.9  9.0  8.8   Total Protein 6.5 - 8.1 g/dL 6.3  7.1  7.1   Total Bilirubin 0.0 - 1.2 mg/dL 0.3  0.4  0.3   Alkaline Phos 38 - 126 U/L 516  339  302   AST 15 - 41 U/L 47  49  33   ALT 0 - 44 U/L 15  17  17     Component Ref Range & Units (hover) 4 wk ago (11/23/23) 2 mo ago (10/14/23) 3 mo ago (09/14/23) 3 mo ago (08/24/23) 4 mo ago (07/27/23) 5 mo ago (07/06/23) 6 mo ago (06/15/23)  Prostatic Specific Antigen >1,500.00 High  >1,500.00 High  CM 1,061.05 High  CM 1,013.69 High  CM 822.82 High  CM 438.43 High  CM 426.49 High  CM   Lab Results  Component Value Date   PSA1 1,362.0 (H) 02/01/2023   PSA1 1,317.0 (H) 01/06/2023   PSA1 334.0 (H) 10/30/2021   Lab Results  Component Value Date   TIBC 398 01/29/2022   FERRITIN 206 01/29/2022   IRONPCTSAT 25 01/29/2022   STUDIES:   EXAM: 10/26/2023 NUCLEAR MEDICINE PET SKULL BASE TO THIGH IMPRESSION: 1. Activity within the multiple skeletal lesions is slightly decreased; however, the individual lesions are slightly expanded in radiotracer activity tissue involvement. Overall minimal interval change in widespread metastatic prostate skeletal disease. 2. No new foci of disease. 3. No lymphadenopathy or visceral metastasis.  Exam: 06/01/2023 CT ABDOMEN AND PELVIS WITHOUT CONTRAST IMPRESSIONS: Left-sided hyfrouteronephrosis which was not previously present. Wuestionable soft tissue lesion in the distal ureter on the left. There may be a mild amount of stranding around the left kidney. There is some mild stranding around the right ureter of uncertain significance as well. Correlate clinically/with laboratory findings. These findings were not previously present. A CT urogram may provide additional information.     HISTORY:   Past Medical History:  Diagnosis Date   Hyperlipidemia    Hypokalemia 03/24/2023   Malignant neoplasm of prostate  (HCC)    Supraventricular tachycardia     Past Surgical History:  Procedure Laterality Date   CORONARY STENT INTERVENTION N/A 02/08/2022   Procedure: CORONARY STENT INTERVENTION;  Surgeon: Mady Bruckner, MD;  Location: [facility] INVASIVE CV LAB;  Service: Cardiovascular;  Laterality: N/A;   LEFT HEART CATH AND CORONARY ANGIOGRAPHY N/A 02/08/2022   Procedure: LEFT HEART CATH AND CORONARY ANGIOGRAPHY;  Surgeon: Mady Bruckner, MD;  Location: [facility] INVASIVE CV LAB;  Service: Cardiovascular;  Laterality: N/A;   PROSTATECTOMY  07/2014   TONSILLECTOMY      Family History  Problem Relation Age of Onset   Breast cancer Mother 29   Ovarian cancer Mother 14   Kidney Stones Sister    Breast cancer Paternal Grandmother 47    Social History:  reports that he has never smoked. He has never used smokeless tobacco. He reports that he does not currently use alcohol. He reports that he does not use drugs.The patient is alone  today.  Allergies:  Allergies  Allergen Reactions   Atorvastatin    Rosuvastatin Other (See Comments)    Other reaction(s): Muscle pain   Simvastatin Other (See Comments)    Other reaction(s): Joint pain, Muscle pain   Sulfa Antibiotics    Sulfamethoxazole-Trimethoprim Other (See Comments)    Unknown as to interaction was a baby.    Current Medications: Current Outpatient Medications  Medication Sig Dispense Refill   Alirocumab 75 MG/ML SOAJ Inject 75 mg into the skin.     Cholecalciferol 25 MCG (1000 UT) tablet Take 1,000 Units by mouth in the morning and at bedtime.     clopidogrel  (PLAVIX ) 75 MG tablet Take 75 mg by mouth daily.     diphenhydrAMINE  (BENADRYL ) 25 mg capsule Take 1 capsule by mouth at bedtime.     diphenhydrAMINE  (BENADRYL ) 50 MG capsule Take 50 mg by mouth at bedtime as needed.     ezetimibe  (ZETIA ) 10 MG tablet Take 10 mg by mouth daily.     guaiFENesin-codeine 100-10 MG/5ML syrup Take 5-10 mLs by mouth every 4 (four) hours as needed.      HYDROcodone -acetaminophen  (NORCO/VICODIN) 5-325 MG tablet Take 1-2 tablets by mouth every 4 (four) hours as needed for moderate pain (pain score 4-6). 30 tablet 0   isosorbide mononitrate (IMDUR) 30 MG 24 hr tablet Take 1 tablet by mouth every 8 (eight) hours as needed.     ketoconazole  (NIZORAL ) 200 MG tablet Take 1 tablet (200 mg total) by mouth 2 (two) times daily. 60 tablet 5   leuprolide  (LUPRON ) 22.5 MG injection Inject 22.5 mg into the muscle every 3 (three) months.     metoprolol  tartrate (LOPRESSOR ) 25 MG tablet Take 0.5 tablets (12.5 mg total) by mouth 2 (two) times daily. 120 tablet 1   morphine  (MS CONTIN ) 30 MG 12 hr tablet Take 1 tablet (30 mg total) by mouth in the morning, at noon, and at bedtime. 90 tablet 0   Multiple Vitamins-Minerals (MULTIVITAMIN WITH MINERALS) tablet Take 1 tablet by mouth daily.     nitroGLYCERIN  (NITROSTAT ) 0.4 MG SL tablet Place 0.4 mg under the tongue every 5 (five) minutes as needed for chest pain. Repeat every 5 minutes if needed for a total of 3 tablets in 15 minutes. If no relief, CALL 911.     Omega-3 Fatty Acids (FISH OIL) 1000 MG CAPS Take 2,000 mg by mouth in the morning and at bedtime.     ondansetron  (ZOFRAN ) 8 MG tablet Take 8 mg by mouth every 8 (eight) hours as needed for nausea or vomiting.     polyethylene glycol powder (GLYCOLAX/MIRALAX) 17 GM/SCOOP powder Take 1 Container by mouth once.     potassium chloride  (KLOR-CON  M) 10 MEQ tablet TAKE 1 TABLET(10 MEQ) BY MOUTH TWICE DAILY 180 tablet 3   predniSONE  (DELTASONE ) 5 MG tablet Take 1 tablet (5 mg total) by mouth 2 (two) times daily with a meal. 60 tablet 5   prochlorperazine  (COMPAZINE ) 10 MG tablet Take 1 tablet (10 mg total) by mouth every 6 (six) hours as needed for nausea or vomiting. 30 tablet 2   Royal Jelly-Bee Pollen-Ginseng (KOREAN GINSENG COMPLEX) 150-250-50 MG CAPS Take 1 capsule by mouth daily.     triamcinolone  (KENALOG ) 0.025 % cream Apply 1 Application topically 2 (two) times  daily. 30 g 5   Zoledronic  Acid (ZOMETA ) 4 MG/100ML IVPB Inject 4 mg into the vein every 3 (three) months.     No current facility-administered medications for this  visit.   Facility-Administered Medications Ordered in Other Visits  Medication Dose Route Frequency Provider Last Rate Last Admin   sodium chloride  flush (NS) 0.9 % injection 10 mL  10 mL Intravenous PRN Cornelius Wanda DEL, MD   10 mL at 03/17/23 0953   sodium chloride  flush (NS) 0.9 % injection 10 mL  10 mL Intracatheter PRN Cornelius Wanda DEL, MD   10 mL at 06/15/23 1234   sodium chloride  flush (NS) 0.9 % injection 10 mL  10 mL Intravenous PRN Cornelius Wanda DEL, MD   10 mL at 07/06/23 9071   sodium chloride  flush (NS) 0.9 % injection 10 mL  10 mL Intravenous PRN Cornelius Wanda DEL, MD   10 mL at 07/27/23 1114   sodium chloride  flush (NS) 0.9 % injection 10 mL  10 mL Intracatheter PRN Cornelius Wanda DEL, MD   10 mL at 09/14/23 9046    I,Merdith Boyd H Breniyah Romm,acting as a scribe for Wanda DEL Cornelius, MD.,have documented all relevant documentation on the behalf of Wanda DEL Cornelius, MD,as directed by  Wanda DEL Cornelius, MD while in the presence of Wanda DEL Cornelius, MD.  I have reviewed this report as typed by the medical scribe, and it is complete and accurate.

## 2024-02-21 NOTE — Patient Instructions (Signed)
 Zoledronic Acid Injection (Cancer) What is this medication? ZOLEDRONIC ACID (ZOE le dron ik AS id) treats high calcium levels in the blood caused by cancer. It may also be used with chemotherapy to treat weakened bones caused by cancer. It works by slowing down the release of calcium from bones. This lowers calcium levels in your blood. It also makes your bones stronger and less likely to break (fracture). It belongs to a group of medications called bisphosphonates. This medicine may be used for other purposes; ask your health care provider or pharmacist if you have questions. COMMON BRAND NAME(S): Zometa, Zometa Powder What should I tell my care team before I take this medication? They need to know if you have any of these conditions: Dehydration Dental disease Kidney disease Liver disease Low levels of calcium in the blood Lung or breathing disease, such as asthma Receiving steroids, such as dexamethasone or prednisone An unusual or allergic reaction to zoledronic acid, other medications, foods, dyes, or preservatives Pregnant or trying to get pregnant Breast-feeding How should I use this medication? This medication is injected into a vein. It is given by your care team in a hospital or clinic setting. Talk to your care team about the use of this medication in children. Special care may be needed. Overdosage: If you think you have taken too much of this medicine contact a poison control center or emergency room at once. NOTE: This medicine is only for you. Do not share this medicine with others. What if I miss a dose? Keep appointments for follow-up doses. It is important not to miss your dose. Call your care team if you are unable to keep an appointment. What may interact with this medication? Certain antibiotics given by injection Diuretics, such as bumetanide, furosemide NSAIDs, medications for pain and inflammation, such as ibuprofen or naproxen Teriparatide Thalidomide This list  may not describe all possible interactions. Give your health care provider a list of all the medicines, herbs, non-prescription drugs, or dietary supplements you use. Also tell them if you smoke, drink alcohol, or use illegal drugs. Some items may interact with your medicine. What should I watch for while using this medication? Visit your care team for regular checks on your progress. It may be some time before you see the benefit from this medication. Some people who take this medication have severe bone, joint, or muscle pain. This medication may also increase your risk for jaw problems or a broken thigh bone. Tell your care team right away if you have severe pain in your jaw, bones, joints, or muscles. Tell you care team if you have any pain that does not go away or that gets worse. Tell your dentist and dental surgeon that you are taking this medication. You should not have major dental surgery while on this medication. See your dentist to have a dental exam and fix any dental problems before starting this medication. Take good care of your teeth while on this medication. Make sure you see your dentist for regular follow-up appointments. You should make sure you get enough calcium and vitamin D while you are taking this medication. Discuss the foods you eat and the vitamins you take with your care team. Check with your care team if you have severe diarrhea, nausea, and vomiting, or if you sweat a lot. The loss of too much body fluid may make it dangerous for you to take this medication. You may need bloodwork while taking this medication. Talk to your care team if  you wish to become pregnant or think you might be pregnant. This medication can cause serious birth defects. What side effects may I notice from receiving this medication? Side effects that you should report to your care team as soon as possible: Allergic reactions--skin rash, itching, hives, swelling of the face, lips, tongue, or  throat Kidney injury--decrease in the amount of urine, swelling of the ankles, hands, or feet Low calcium level--muscle pain or cramps, confusion, tingling, or numbness in the hands or feet Osteonecrosis of the jaw--pain, swelling, or redness in the mouth, numbness of the jaw, poor healing after dental work, unusual discharge from the mouth, visible bones in the mouth Severe bone, joint, or muscle pain Side effects that usually do not require medical attention (report to your care team if they continue or are bothersome): Constipation Fatigue Fever Loss of appetite Nausea Stomach pain This list may not describe all possible side effects. Call your doctor for medical advice about side effects. You may report side effects to FDA at 1-800-FDA-1088. Where should I keep my medication? This medication is given in a hospital or clinic. It will not be stored at home. NOTE: This sheet is a summary. It may not cover all possible information. If you have questions about this medicine, talk to your doctor, pharmacist, or health care provider.  2024 [employer] Standard (2021-04-24 00:00:00)Leuprolide Solution for Injection What is this medication? LEUPROLIDE (loo PROE lide) reduces the symptoms of prostate cancer. It works by decreasing levels of the hormone testosterone in the body. This prevents prostate cancer cells from spreading or growing. This medicine may be used for other purposes; ask your health care provider or pharmacist if you have questions. COMMON BRAND NAME(S): Lupron What should I tell my care team before I take this medication? They need to know if you have any of these conditions: Diabetes Heart attack Heart disease High blood pressure High cholesterol Pain or difficulty passing urine Spinal cord metastasis Stroke Tobacco use An unusual or allergic reaction to leuprolide, other medications, foods, dyes, or preservatives Pregnant or trying to get  pregnant Breast-feeding How should I use this medication? This medication is for injection under the skin or into a muscle. You will be taught how to prepare and give this medication. Use exactly as directed. Take your medication at regular intervals. Do not take it more often than directed. It is important that you put your used needles and syringes in a special sharps container. Do not put them in a trash can. If you do not have a sharps container, call your care team to get one. A special MedGuide will be given to you by the pharmacist with each prescription and refill. Be sure to read this information carefully each time. Talk to your care team about the use of this medication in children. While this medication may be prescribed for children as young as 8 years for selected conditions, precautions do apply. Overdosage: If you think you have taken too much of this medicine contact a poison control center or emergency room at once. NOTE: This medicine is only for you. Do not share this medicine with others. What if I miss a dose? If you miss a dose, take it as soon as you can. If it is almost time for your next dose, take only that dose. Do not take double or extra doses. What may interact with this medication? Do not take this medication with any of the following: Chasteberry Cisapride Dronedarone Pimozide Thioridazine This medication may  also interact with the following: Estrogen or progestin hormones Herbal or dietary supplements, like black cohosh or DHEA Other medications that cause heart rhythm changes Testosterone This list may not describe all possible interactions. Give your health care provider a list of all the medicines, herbs, non-prescription drugs, or dietary supplements you use. Also tell them if you smoke, drink alcohol, or use illegal drugs. Some items may interact with your medicine. What should I watch for while using this medication? Visit your care team for regular  checks on your progress. During the first week, your symptoms may get worse, but then will improve as you continue your treatment. You may get hot flashes, increased bone pain, increased difficulty passing urine, or an aggravation of nerve symptoms. Discuss these effects with your care team, some of them may improve with continued use of this medication. Patients may experience a menstrual cycle or spotting during the first 2 months of therapy with this medication. If this continues, contact your care team. This medication may increase blood sugar. The risk may be higher in patients who already have diabetes. Ask your care team what you can do to lower your risk of diabetes while taking this medication. What side effects may I notice from receiving this medication? Side effects that you should report to your care team as soon as possible: Allergic reactions--skin rash, itching, hives, swelling of the face, lips, tongue, or throat Heart attack--pain or tightness in the chest, shoulders, arms, or jaw, nausea, shortness of breath, cold or clammy skin, feeling faint or lightheaded Heart rhythm changes--fast or irregular heartbeat, dizziness, feeling faint or lightheaded, chest pain, trouble breathing High blood sugar (hyperglycemia)--increased thirst or amount of urine, unusual weakness or fatigue, blurry vision New or worsening seizures Redness, blistering, peeling, or loosening of the skin, including inside the mouth Stroke--sudden numbness or weakness of the face, arm, or leg, trouble speaking, confusion, trouble walking, loss of balance or coordination, dizziness, severe headache, change in vision Swelling and pain of the tumor site or lymph nodes Side effects that usually do not require medical attention (report these to your care team if they continue or are bothersome): Change in sex drive or performance Hot flashes Joint pain Pain, redness, or irritation at injection site Swelling of the  ankles, hands, or feet Unusual weakness or fatigue This list may not describe all possible side effects. Call your doctor for medical advice about side effects. You may report side effects to FDA at 1-800-FDA-1088. Where should I keep my medication? Keep out of the reach of children and pets. Store below 25 degrees C (77 degrees F). Do not freeze. Protect from light. Get rid of any unused medication after the expiration date. To get rid of medications that are no longer needed or have expired: Take the medication to a medication take-back program. Check with your pharmacy or law enforcement to find a location. If you cannot return the medication, ask your pharmacist or care team how to get rid of this medication safely. NOTE: This sheet is a summary. It may not cover all possible information. If you have questions about this medicine, talk to your doctor, pharmacist, or health care provider.  2024 [employer]/Gold Standard (2023-02-11 00:00:00)

## 2024-02-21 NOTE — Progress Notes (Signed)
 [id number] called and notified that Dr Cornelius would like him to resume talking 1 tablet (600mg ) of calcium w/vit d daily.  Pt verbalizes understanding.

## 2024-02-28 ENCOUNTER — Encounter: Payer: Self-pay | Admitting: Oncology

## 2024-03-02 ENCOUNTER — Inpatient Hospital Stay

## 2024-03-02 ENCOUNTER — Inpatient Hospital Stay: Admitting: Hematology and Oncology

## 2024-03-02 ENCOUNTER — Other Ambulatory Visit: Payer: Self-pay | Admitting: Hematology and Oncology

## 2024-03-02 ENCOUNTER — Other Ambulatory Visit: Payer: Self-pay

## 2024-03-02 ENCOUNTER — Encounter: Payer: Self-pay | Admitting: Oncology

## 2024-03-02 VITALS — BP 133/61 | HR 88 | Temp 98.8°F | Resp 18

## 2024-03-02 DIAGNOSIS — E860 Dehydration: Secondary | ICD-10-CM

## 2024-03-02 DIAGNOSIS — C61 Malignant neoplasm of prostate: Secondary | ICD-10-CM

## 2024-03-02 DIAGNOSIS — K529 Noninfective gastroenteritis and colitis, unspecified: Secondary | ICD-10-CM

## 2024-03-02 DIAGNOSIS — R7401 Elevation of levels of liver transaminase levels: Secondary | ICD-10-CM

## 2024-03-02 DIAGNOSIS — C7951 Secondary malignant neoplasm of bone: Secondary | ICD-10-CM

## 2024-03-02 LAB — CMP (CANCER CENTER ONLY)
ALT: 14 U/L (ref 0–44)
AST: 280 U/L (ref 15–41)
Albumin: 3.8 g/dL (ref 3.5–5.0)
Alkaline Phosphatase: 600 U/L — ABNORMAL HIGH (ref 38–126)
Anion gap: 13 (ref 5–15)
BUN: 13 mg/dL (ref 8–23)
CO2: 22 mmol/L (ref 22–32)
Calcium: 8.5 mg/dL — ABNORMAL LOW (ref 8.9–10.3)
Chloride: 103 mmol/L (ref 98–111)
Creatinine: 0.52 mg/dL — ABNORMAL LOW (ref 0.61–1.24)
GFR, Estimated: >60 mL/min
Glucose, Bld: 137 mg/dL — ABNORMAL HIGH (ref 70–99)
Potassium: 3.8 mmol/L (ref 3.5–5.1)
Sodium: 137 mmol/L (ref 135–145)
Total Bilirubin: 0.5 mg/dL (ref 0.0–1.2)
Total Protein: 6.4 g/dL — ABNORMAL LOW (ref 6.5–8.1)

## 2024-03-02 LAB — HEPATITIS PANEL, ACUTE
HCV Ab: NONREACTIVE
Hep A IgM: NONREACTIVE
Hep B C IgM: NONREACTIVE
Hepatitis B Surface Ag: NONREACTIVE

## 2024-03-02 LAB — VITAMIN D 25 HYDROXY (VIT D DEFICIENCY, FRACTURES): Vit D, 25-Hydroxy: 56.2 ng/mL (ref 30–100)

## 2024-03-02 LAB — CBC WITH DIFFERENTIAL (CANCER CENTER ONLY)
Abs Immature Granulocytes: 0.48 K/uL — ABNORMAL HIGH (ref 0.00–0.07)
Basophils Absolute: 0 K/uL (ref 0.0–0.1)
Basophils Relative: 0 %
Eosinophils Absolute: 0 K/uL (ref 0.0–0.5)
Eosinophils Relative: 0 %
HCT: 35.2 % — ABNORMAL LOW (ref 39.0–52.0)
Hemoglobin: 10.9 g/dL — ABNORMAL LOW (ref 13.0–17.0)
Immature Granulocytes: 6 %
Lymphocytes Relative: 16 %
Lymphs Abs: 1.3 K/uL (ref 0.7–4.0)
MCH: 26.8 pg (ref 26.0–34.0)
MCHC: 31 g/dL (ref 30.0–36.0)
MCV: 86.5 fL (ref 80.0–100.0)
Monocytes Absolute: 0.7 K/uL (ref 0.1–1.0)
Monocytes Relative: 9 %
Neutro Abs: 5.3 K/uL (ref 1.7–7.7)
Neutrophils Relative %: 69 %
Platelet Count: 207 K/uL (ref 150–400)
RBC: 4.07 MIL/uL — ABNORMAL LOW (ref 4.22–5.81)
RDW: 16.8 % — ABNORMAL HIGH (ref 11.5–15.5)
Smear Review: NORMAL
WBC Count: 7.9 K/uL (ref 4.0–10.5)
nRBC: 0.3 % — ABNORMAL HIGH (ref 0.0–0.2)

## 2024-03-02 LAB — MAGNESIUM: Magnesium: 2.3 mg/dL (ref 1.7–2.4)

## 2024-03-02 LAB — PSA: Prostatic Specific Antigen: >1500 ng/mL — ABNORMAL HIGH (ref 0.00–4.00)

## 2024-03-02 MED ORDER — DEXAMETHASONE SOD PHOSPHATE PF 10 MG/ML IJ SOLN
8.0000 mg | Freq: Once | INTRAMUSCULAR | Status: AC
  Administered 2024-03-02: 8 mg via INTRAVENOUS

## 2024-03-02 MED ORDER — SODIUM CHLORIDE 0.9 % IV SOLN: Freq: Once | INTRAVENOUS | Status: AC

## 2024-03-02 MED ORDER — ONDANSETRON HCL 4 MG/2ML IJ SOLN
8.0000 mg | Freq: Once | INTRAMUSCULAR | Status: AC
  Administered 2024-03-02: 8 mg via INTRAVENOUS
  Filled 2024-03-02: qty 4

## 2024-03-02 NOTE — Progress Notes (Addendum)
 " [facility]  [address] [place],  [place]  [address] [phone]    Addendum: Acute hepatitis panel was negative.  CMV IgM was negative.  CMV IgG antibody was positive consistent with prior infection.  Clinic Day:  03/02/2024  Referring physician: Paniagua, Tiziana, MD  ASSESSMENT & PLAN:   Assessment & Plan: Acute gastroenteritis Nausea, vomiting and diarrhea since Monday.  He was seen in the ED yesterday.  Flu, COVID and RSV were negative.  He received IV fluids and was discharged.  The symptoms have not resolved.  He will receive IV fluids and antiemetics today.  I instructed him to use his ondansetron  and prochlorperazine  at home.  I will plan to see him back early next week with a CBC and comprehensive metabolic panel.  Elevated SGOT (AST) Acute elevation of AST.  This may be due to acute illness.  I will obtain a hepatitis panel and CMV antibodies to further evaluate.  Malignant neoplasm of prostate (HCC) Stage IVA prostate cancer with bone metastasis diagnosed in January 2016.  He has been through multiple lines of chemotherapy and completed androgen blockade and has had a significant increase in his PSA, now measured at greater than 1500.  PSMA PET in August was fairly stable.  He was placed on a trial of ketoconazole  and September and continued leuprolide  and zoledronic  acid.  He has remained fairly asymptomatic.    The patient understands the plans discussed today and is in agreement with them.  He knows to contact our office if he develops concerns prior to his next appointment.   I provided 20 minutes of face-to-face time during this encounter and > 50% was spent counseling as documented under my assessment and plan.    [name], PA-C  [facility] [place] [facility] OF [place] [address] [place] [address] Dept: [phone] Dept Fax: [phone]   Orders Placed This Encounter   Procedures   CMV antibody, IgG (EIA)    Standing Status:   Future    Number of Occurrences:   1    Expiration Date:   03/02/2025   CMV IgM    Standing Status:   Future    Number of Occurrences:   1    Expiration Date:   03/02/2025   CBC with Differential (Cancer Center Only)    Standing Status:   Future    Expected Date:   03/05/2024    Expiration Date:   06/03/2024   CMP (Cancer Center only)    Standing Status:   Future    Expected Date:   03/05/2024    Expiration Date:   06/03/2024      CHIEF COMPLAINT:  CC: Metastatic prostate cancer to bone  Current Treatment: Leuprolide  and zoledronic  acid every 3 months  HISTORY OF PRESENT ILLNESS:  Marvin Johnson is a 73 year old with a history of stage IVA prostate cancer including spread of his disease to his bones diagnosed in January 2016.  He was initially treated with leuprolide  and zoledronic  acid.  He had progressive disease in the bone and lung in September 2023, so was placed on docetaxel  chemotherapy and continued leuprolide  and zoledronic  acid.  His PSA went down from 536 to 400.75 after 3 cycles, then to 284 with the 5th cycle, and then 203 in March 2024. PSA then went up to 261 in May. We decided to give him a break from chemotherapy at  that time.  We continued leuprolide  and zoledronic  acid every 3 months. The PSA was 1156 in August 2024. PSMA PET in September 2024 revealed progression of skeletal metastasis.   He was placed on palliative chemotherapy with cabazitaxel /prednisone  until June 2025. His PSA went steadily down until March when it was 371.  The PSA then started rising, to 822.82 in June 2025 and then to 1,013.69 within a month, and then to 1,061 one month later. It has now been over 1,500 since August 2025 and I do not have specific readings. PET scan done in August revealed the activity within the multiple skeletal lesions was slightly decreased; however, the individual lesions had slightly expanded in radiotracer  activity tissue involvement.  There was no new foci of disease, no lymphadenopathy or visceral metastasis, and the overall minimal interval change in widespread metastatic prostate skeletal disease. We continued Lupron  injections.  He was placed on a trial of ketoconazole  in September since he has been through almost everything else, except for Pluvicto. We plan consider this when he becomes more symptomatic from his bone metastasis.    Oncology History  Malignant neoplasm of prostate (HCC)  03/30/2014 Cancer Staging   Staging form: Prostate, AJCC 8th Edition - Clinical stage from 03/30/2014: Stage IVA (cT3b, cN1, cM0, PSA: 51.8, Grade Group: 4) - Signed by Cornelius Wanda DEL, MD on 11/14/2020 Histopathologic type: Adenocarcinoma, NOS Stage prefix: Initial diagnosis Prostate specific antigen (PSA) range: 20 or greater Gleason primary pattern: 4 Gleason secondary pattern: 4 Gleason score: 8 Histologic grading system: 5 grade system Location of positive needle core biopsies: Beyond primary site Prognostic indicators: May 2016 Robotic prostatectomy with mult. Pos margins, extra prostatic extension, 2/6 pos nodes. Scan June 2016 positive at T10  PSA up to 216.3 by August Placed on lupron  and casodex Stage used in treatment planning: Yes National guidelines used in treatment planning: Yes Type of national guideline used in treatment planning: NCCN   02/18/2020 Initial Diagnosis   Malignant neoplasm of prostate (HCC)   12/01/2021 - 07/19/2022 Chemotherapy   Patient is on Treatment Plan : PROSTATE Docetaxel  (73) + Prednisone  q21d     12/21/2022 - 08/26/2023 Chemotherapy   Patient is on Treatment Plan : PROSTATE Cabazitaxel  (20) D1 + Prednisone  D1-21 q21d     Secondary malignant neoplasm of bone and bone marrow (HCC)  02/18/2020 Initial Diagnosis   Secondary malignant neoplasm of bone and bone marrow (HCC)   12/01/2021 - 07/19/2022 Chemotherapy   Patient is on Treatment Plan : PROSTATE Docetaxel   (75) + Prednisone  q21d     12/21/2022 - 08/26/2023 Chemotherapy   Patient is on Treatment Plan : PROSTATE Cabazitaxel  (20) D1 + Prednisone  D1-21 q21d     Malignant neoplasm of prostate metastatic to lung (HCC)  11/25/2021 Initial Diagnosis   Malignant neoplasm of prostate metastatic to lung (HCC)   12/21/2022 - 08/26/2023 Chemotherapy   Patient is on Treatment Plan : PROSTATE Cabazitaxel  (20) D1 + Prednisone  D1-21 q21d         INTERVAL HISTORY:   Henrick is added to the schedule today as his nephew telephoned reporting continued nausea, vomiting and diarrhea.  The patient was seen in the ED yesterday.  Testing for flu, COVID and RSV was negative.  He was felt to have acute viral gastroenteritis.  He received IV fluids and was discharged home.  He denies fevers or chills. He denies pain. His appetite is good. His weight was not taken today.  REVIEW OF SYSTEMS:   Review  of Systems  Constitutional:  Negative for appetite change, chills, fatigue, fever and unexpected weight change.  HENT:   Negative for lump/mass, mouth sores and sore throat.   Respiratory:  Negative for cough and shortness of breath.   Cardiovascular:  Negative for chest pain and leg swelling.  Gastrointestinal:  Positive for diarrhea, nausea and vomiting. Negative for abdominal pain and constipation.  Genitourinary:  Negative for difficulty urinating, dysuria, frequency and hematuria.   Musculoskeletal:  Negative for arthralgias, back pain and myalgias.  Skin:  Negative for itching, rash and wound.  Neurological:  Negative for dizziness, extremity weakness, headaches, light-headedness and numbness.  Hematological:  Negative for adenopathy. Does not bruise/bleed easily.  Psychiatric/Behavioral:  Negative for depression and sleep disturbance. The patient is not nervous/anxious.      VITALS:  Vital signs today include a temperature of 98.8, blood pressure 145/71, pulse 103, respirations 18 and pulse ox 93%. There were no  vitals taken for this visit.  Wt Readings from Last 3 Encounters:  02/21/24 180 lb 3.2 oz (81.7 kg)  01/24/24 178 lb 9.6 oz (81 kg)  12/21/23 177 lb 4.8 oz (80.4 kg)    There is no height or weight on file to calculate BMI.  Performance status (ECOG): 2 - Symptomatic, <50% confined to bed    PHYSICAL EXAM:   Physical Exam Vitals and nursing note reviewed.  Constitutional:      General: He is not in acute distress.    Appearance: Normal appearance. He is normal weight. He is ill-appearing (mildly weak appearing with).  HENT:     Head: Normocephalic and atraumatic.     Mouth/Throat:     Mouth: Mucous membranes are dry.     Pharynx: Oropharynx is clear. No oropharyngeal exudate or posterior oropharyngeal erythema.  Eyes:     General: No scleral icterus.    Extraocular Movements: Extraocular movements intact.     Conjunctiva/sclera: Conjunctivae normal.     Pupils: Pupils are equal, round, and reactive to light.  Cardiovascular:     Rate and Rhythm: Normal rate and regular rhythm.     Heart sounds: Normal heart sounds. No murmur heard.    No friction rub. No gallop.  Pulmonary:     Effort: Pulmonary effort is normal.     Breath sounds: Normal breath sounds. No wheezing, rhonchi or rales.  Abdominal:     General: Bowel sounds are normal. There is no distension.     Palpations: Abdomen is soft. There is no mass.     Tenderness: There is no abdominal tenderness.  Musculoskeletal:        General: Normal range of motion.     Cervical back: Normal range of motion and neck supple. No tenderness.     Right lower leg: No edema.     Left lower leg: No edema.  Lymphadenopathy:     Cervical: No cervical adenopathy.  Skin:    General: Skin is warm and dry.     Coloration: Skin is not jaundiced.     Findings: No rash.     Comments: Skin turgor is decreased  Neurological:     Mental Status: He is alert and oriented to person, place, and time.     Cranial Nerves: No cranial nerve  deficit.  Psychiatric:        Mood and Affect: Mood normal.        Behavior: Behavior normal.        Thought Content: Thought content normal.  LABS:      Latest Ref Rng & Units 03/02/2024   12:47 PM 02/21/2024    7:58 AM 01/24/2024    8:21 AM  CBC  WBC 4.0 - 10.5 K/uL 7.9  8.0  10.1   Hemoglobin 13.0 - 17.0 g/dL 89.0  89.0  87.8   Hematocrit 39.0 - 52.0 % 35.2  35.5  38.4   Platelets 150 - 400 K/uL 207  229  264       Latest Ref Rng & Units 03/02/2024   12:47 PM 02/21/2024    7:58 AM 01/24/2024    8:21 AM  CMP  Glucose 70 - 99 mg/dL 862  862  99   BUN 8 - 23 mg/dL 13  15  17    Creatinine 0.61 - 1.24 mg/dL 9.47  9.19  9.19   Sodium 135 - 145 mmol/L 137  138  138   Potassium 3.5 - 5.1 mmol/L 3.8  4.3  4.4   Chloride 98 - 111 mmol/L 103  101  102   CO2 22 - 32 mmol/L 22  27  25    Calcium 8.9 - 10.3 mg/dL 8.5  8.9  9.0   Total Protein 6.5 - 8.1 g/dL 6.4  6.3  7.1   Total Bilirubin 0.0 - 1.2 mg/dL 0.5  0.3  0.4   Alkaline Phos 38 - 126 U/L 600  516  339   AST 15 - 41 U/L 280  47  49   ALT 0 - 44 U/L 14  15  17      Lab Results  Component Value Date   TIBC 398 01/29/2022   FERRITIN 206 01/29/2022   IRONPCTSAT 25 01/29/2022    STUDIES:   No results found.    HISTORY:   Past Medical History:  Diagnosis Date   Hyperlipidemia    Hypokalemia 03/24/2023   Malignant neoplasm of prostate (HCC)    Supraventricular tachycardia     Past Surgical History:  Procedure Laterality Date   CORONARY STENT INTERVENTION N/A 02/08/2022   Procedure: CORONARY STENT INTERVENTION;  Surgeon: Mady Bruckner, MD;  Location: [facility]VASIVE [facility];  Service: Cardiovascular;  Laterality: N/A;   LEFT HEART CATH AND CORONARY ANGIOGRAPHY N/A 02/08/2022   Procedure: LEFT HEART CATH AND CORONARY ANGIOGRAPHY;  Surgeon: Mady Bruckner, MD;  Location: [facility]VAS[facility] CV [facility];  Service: Cardiovascular;  Laterality: N/A;   PROSTATECTOMY  07/2014   TONSILLECTOMY      Family History  Problem  Relation Age of Onset   Breast cancer Mother 49   Ovarian cancer Mother 20   Kidney Stones Sister    Breast cancer Paternal Grandmother 74    Social History:  reports that he has never smoked. He has never used smokeless tobacco. He reports that he does not currently use alcohol. He reports that he does not use drugs.The patient is accompanied by his nephew, Oneil, today.  Allergies: Allergies[1]  Current Medications: Current Outpatient Medications  Medication Sig Dispense Refill   Alirocumab 75 MG/ML SOAJ Inject 75 mg into the skin.     Cholecalciferol 25 MCG (1000 UT) tablet Take 1,000 Units by mouth in the morning and at bedtime.     clopidogrel  (PLAVIX ) 75 MG tablet Take 75 mg by mouth daily.     diphenhydrAMINE  (BENADRYL ) 25 mg capsule Take 1 capsule by mouth at bedtime.     diphenhydrAMINE  (BENADRYL ) 50 MG capsule Take 50 mg by mouth at bedtime as needed.     ezetimibe  (ZETIA )  10 MG tablet Take 10 mg by mouth daily.     guaiFENesin-codeine 100-10 MG/5ML syrup Take 5-10 mLs by mouth every 4 (four) hours as needed.     HYDROcodone -acetaminophen  (NORCO/VICODIN) 5-325 MG tablet Take 1-2 tablets by mouth every 4 (four) hours as needed for moderate pain (pain score 4-6). 30 tablet 0   isosorbide mononitrate (IMDUR) 30 MG 24 hr tablet Take 1 tablet by mouth every 8 (eight) hours as needed.     ketoconazole  (NIZORAL ) 200 MG tablet Take 1 tablet (200 mg total) by mouth 2 (two) times daily. 60 tablet 5   leuprolide  (LUPRON ) 22.5 MG injection Inject 22.5 mg into the muscle every 3 (three) months.     metoprolol  tartrate (LOPRESSOR ) 25 MG tablet Take 0.5 tablets (12.5 mg total) by mouth 2 (two) times daily. 120 tablet 1   morphine  (MS CONTIN ) 30 MG 12 hr tablet Take 1 tablet (30 mg total) by mouth in the morning, at noon, and at bedtime. 90 tablet 0   Multiple Vitamins-Minerals (MULTIVITAMIN WITH MINERALS) tablet Take 1 tablet by mouth daily.     nitroGLYCERIN  (NITROSTAT ) 0.4 MG SL tablet Place  0.4 mg under the tongue every 5 (five) minutes as needed for chest pain. Repeat every 5 minutes if needed for a total of 3 tablets in 15 minutes. If no relief, CALL 911.     Omega-3 Fatty Acids (FISH OIL) 1000 MG CAPS Take 2,000 mg by mouth in the morning and at bedtime.     ondansetron  (ZOFRAN ) 8 MG tablet Take 8 mg by mouth every 8 (eight) hours as needed for nausea or vomiting.     polyethylene glycol powder (GLYCOLAX/MIRALAX) 17 GM/SCOOP powder Take 1 Container by mouth once.     potassium chloride  (KLOR-CON  M) 10 MEQ tablet TAKE 1 TABLET(10 MEQ) BY MOUTH TWICE DAILY 180 tablet 3   predniSONE  (DELTASONE ) 5 MG tablet Take 1 tablet (5 mg total) by mouth 2 (two) times daily with a meal. 60 tablet 5   prochlorperazine  (COMPAZINE ) 10 MG tablet Take 1 tablet (10 mg total) by mouth every 6 (six) hours as needed for nausea or vomiting. 30 tablet 2   Royal Jelly-Bee Pollen-Ginseng (KOREAN GINSENG COMPLEX) 150-250-50 MG CAPS Take 1 capsule by mouth daily.     triamcinolone  (KENALOG ) 0.025 % cream Apply 1 Application topically 2 (two) times daily. 30 g 5   Zoledronic  Acid (ZOMETA ) 4 MG/100ML IVPB Inject 4 mg into the vein every 3 (three) months.     No current facility-administered medications for this visit.   Facility-Administered Medications Ordered in Other Visits  Medication Dose Route Frequency Provider Last Rate Last Admin   sodium chloride  flush (NS) 0.9 % injection 10 mL  10 mL Intravenous PRN Cornelius Wanda DEL, MD   10 mL at 03/17/23 0953   sodium chloride  flush (NS) 0.9 % injection 10 mL  10 mL Intracatheter PRN Cornelius Wanda DEL, MD   10 mL at 06/15/23 1234   sodium chloride  flush (NS) 0.9 % injection 10 mL  10 mL Intravenous PRN Cornelius Wanda DEL, MD   10 mL at 07/06/23 9071   sodium chloride  flush (NS) 0.9 % injection 10 mL  10 mL Intravenous PRN Cornelius Wanda DEL, MD   10 mL at 07/27/23 1114   sodium chloride  flush (NS) 0.9 % injection 10 mL  10 mL Intracatheter PRN Cornelius Wanda DEL, MD   10 mL at 09/14/23 9046             [  1]  Allergies Allergen Reactions   Atorvastatin    Rosuvastatin Other (See Comments)    Other reaction(s): Muscle pain   Simvastatin Other (See Comments)    Other reaction(s): Joint pain, Muscle pain   Sulfa Antibiotics    Sulfamethoxazole-Trimethoprim Other (See Comments)    Unknown as to interaction was a baby.   "

## 2024-03-02 NOTE — Patient Instructions (Signed)
 Dehydration, Adult Dehydration is a condition in which there is not enough water or other fluids in the body. This happens when a person loses more fluids than they take in. Important organs cannot work right without the right amount of fluids. Any loss of fluids from the body can cause dehydration. Dehydration can be mild, worse, or very bad. It should be treated right away to keep it from getting very bad. What are the causes? Conditions that cause loss of water in the body. They include: Watery poop (diarrhea). Vomiting. Sweating a lot. Fever. Infection. Peeing (urinating) a lot. Not drinking enough fluids. Certain medicines, such as medicines that take extra fluid out of the body (diuretics). Lack of safe drinking water. Not being able to get enough water and food. What increases the risk? Having a long-term (chronic) illness that has not been treated the right way, such as: Diabetes. Heart disease. Kidney disease. Being 25 years of age or older. Having a disability. Living in a place that is high above the ground or sea (high in altitude). The thinner, drier air causes more fluid loss. Doing exercises that put stress on your body for a long time. Being active when in hot places. What are the signs or symptoms? Symptoms of dehydration depend on how bad it is. Mild or worse dehydration Thirst. Dry lips or dry mouth. Feeling dizzy or light-headed. Muscle cramps. Passing little pee or dark pee. Pee may be the color of tea. Headache. Very bad dehydration Changes in skin. Skin may: Be cold to the touch (clammy). Be blotchy or pale. Not go back to normal right after you pinch it and let it go. Little or no tears, pee, or sweat. Fast breathing. Low blood pressure. Weak pulse. Pulse that is more than 100 beats a minute when you are sitting still. Other changes, such as: Feeling very thirsty. Eyes that look hollow (sunken). Cold hands and feet. Being confused. Being very  tired (lethargic) or having trouble waking from sleep. Losing weight. Loss of consciousness. How is this treated? Treatment for this condition depends on how bad your dehydration is. Treatment should start right away. Do not wait until your condition gets very bad. Very bad dehydration is an emergency. You will need to go to a hospital. Mild or worse dehydration can be treated at home. You may be asked to: Drink more fluids. Drink an oral rehydration solution (ORS). This drink gives you the right amount of fluids, salts, and minerals (electrolytes). Very bad dehydration can be treated: With fluids through an IV tube. By correcting low levels of electrolytes in the body. By treating the problem that caused your dehydration. Follow these instructions at home: Oral rehydration solution If told by your doctor, drink an ORS: Make an ORS. Use instructions on the package. Start by drinking small amounts, about  cup (120 mL) every 5-10 minutes. Slowly drink more until you have had the amount that your doctor said to have.  Eating and drinking  Drink enough clear fluid to keep your pee pale yellow. If you were told to drink an ORS, finish the ORS first. Then, start slowly drinking other clear fluids. Drink fluids such as: Water. Do not drink only water. Doing that can make the salt (sodium) level in your body get too low. Water from ice chips you suck on. Fruit juice that you have added water to (diluted). Low-calorie sports drinks. Eat foods that have the right amounts of salts and minerals, such as bananas, oranges, potatoes,  tomatoes, or spinach. Do not drink alcohol. Avoid drinks that have caffeine or sugar. These include:: High-calorie sports drinks. Fruit juice that you did not add water to. Soda. Coffee or energy drinks. Avoid foods that are greasy or have a lot of fat or sugar. General instructions Take over-the-counter and prescription medicines only as told by your doctor. Do  not take sodium tablets. Doing that can make the salt level in your body get too high. Return to your normal activities as told by your doctor. Ask your doctor what activities are safe for you. Keep all follow-up visits. Your doctor may check and change your treatment. Contact a doctor if: You have pain in your belly (abdomen) and the pain: Gets worse. Stays in one place. You have a rash. You have a stiff neck. You get angry or annoyed more easily than normal. You are more tired or have a harder time waking than normal. You feel weak or dizzy. You feel very thirsty. Get help right away if: You have any symptoms of very bad dehydration. You vomit every time you eat or drink. Your vomiting gets worse, does not go away, or you vomit blood or green stuff. You are getting treatment, but symptoms are getting worse. You have a fever. You have a very bad headache. You have: Diarrhea that gets worse or does not go away. Blood in your poop (stool). This may cause poop to look black and tarry. No pee in 6-8 hours. Only a small amount of pee in 6-8 hours, and the pee is very dark. You have trouble breathing. These symptoms may be an emergency. Get help right away. Call 911. Do not wait to see if the symptoms will go away. Do not drive yourself to the hospital. This information is not intended to replace advice given to you by your health care provider. Make sure you discuss any questions you have with your health care provider. Document Revised: 09/28/2021 Document Reviewed: 09/28/2021 Elsevier Patient Education  2024 [employer].

## 2024-03-02 NOTE — Progress Notes (Signed)
 CRITICAL VALUE STICKER  CRITICAL VALUE:  AST 280  RECEIVER (on-site recipient of call):  Woodie Kapur RN  DATE & TIME NOTIFIED:   03/02/2024 1404  MESSENGER (representative from lab):  [facility]  MD NOTIFIED:   Curtis Foy PA  TIME OF NOTIFICATION:  1410  RESPONSE:  scheduled to see patient.

## 2024-03-03 ENCOUNTER — Encounter: Payer: Self-pay | Admitting: Hematology and Oncology

## 2024-03-03 DIAGNOSIS — K529 Noninfective gastroenteritis and colitis, unspecified: Secondary | ICD-10-CM | POA: Insufficient documentation

## 2024-03-03 LAB — CMV IGM: CMV IgM: <30 [AU]/ml (ref 0.0–29.9)

## 2024-03-03 LAB — CMV ANTIBODY, IGG (EIA): CMV Ab - IgG: 2.8 U/mL — ABNORMAL HIGH (ref 0.00–0.59)

## 2024-03-03 NOTE — Assessment & Plan Note (Signed)
 Nausea, vomiting and diarrhea since Monday.  He was seen in the ED yesterday.  Flu, COVID and RSV were negative.  He received IV fluids and was discharged.  The symptoms have not resolved.  He will receive IV fluids and antiemetics today.  I instructed him to use his ondansetron  and prochlorperazine  at home.  I will plan to see him back early next week with a CBC and comprehensive metabolic panel.

## 2024-03-03 NOTE — Assessment & Plan Note (Addendum)
 Acute elevation of AST.  This may be due to acute illness.  I will obtain a hepatitis panel and CMV antibodies to further evaluate.

## 2024-03-03 NOTE — Assessment & Plan Note (Signed)
 Stage IVA prostate cancer with bone metastasis diagnosed in January 2016.  He has been through multiple lines of chemotherapy and completed androgen blockade and has had a significant increase in his PSA, now measured at greater than 1500.  PSMA PET in August was fairly stable.  He was placed on a trial of ketoconazole  and September and continued leuprolide  and zoledronic  acid.  He has remained fairly asymptomatic.

## 2024-03-05 ENCOUNTER — Inpatient Hospital Stay: Admitting: Hematology and Oncology

## 2024-03-05 ENCOUNTER — Encounter: Payer: Self-pay | Admitting: Hematology and Oncology

## 2024-03-05 ENCOUNTER — Inpatient Hospital Stay

## 2024-03-05 ENCOUNTER — Encounter: Payer: Self-pay | Admitting: Oncology

## 2024-03-05 VITALS — BP 126/54 | HR 67 | Temp 98.0°F | Resp 18 | Ht 67.0 in | Wt 171.9 lb

## 2024-03-05 DIAGNOSIS — E860 Dehydration: Secondary | ICD-10-CM

## 2024-03-05 DIAGNOSIS — C61 Malignant neoplasm of prostate: Secondary | ICD-10-CM

## 2024-03-05 DIAGNOSIS — K529 Noninfective gastroenteritis and colitis, unspecified: Secondary | ICD-10-CM

## 2024-03-05 DIAGNOSIS — C7800 Secondary malignant neoplasm of unspecified lung: Secondary | ICD-10-CM

## 2024-03-05 DIAGNOSIS — R7401 Elevation of levels of liver transaminase levels: Secondary | ICD-10-CM

## 2024-03-05 LAB — CMP (CANCER CENTER ONLY)
ALT: 10 U/L (ref 0–44)
AST: 80 U/L — ABNORMAL HIGH (ref 15–41)
Albumin: 3.8 g/dL (ref 3.5–5.0)
Alkaline Phosphatase: 503 U/L — ABNORMAL HIGH (ref 38–126)
Anion gap: 10 (ref 5–15)
BUN: 12 mg/dL (ref 8–23)
CO2: 25 mmol/L (ref 22–32)
Calcium: 8.3 mg/dL — ABNORMAL LOW (ref 8.9–10.3)
Chloride: 104 mmol/L (ref 98–111)
Creatinine: 0.7 mg/dL (ref 0.61–1.24)
GFR, Estimated: >60 mL/min
Glucose, Bld: 150 mg/dL — ABNORMAL HIGH (ref 70–99)
Potassium: 3.7 mmol/L (ref 3.5–5.1)
Sodium: 139 mmol/L (ref 135–145)
Total Bilirubin: 0.3 mg/dL (ref 0.0–1.2)
Total Protein: 6.3 g/dL — ABNORMAL LOW (ref 6.5–8.1)

## 2024-03-05 LAB — CBC WITH DIFFERENTIAL (CANCER CENTER ONLY)
Abs Immature Granulocytes: 0.57 K/uL — ABNORMAL HIGH (ref 0.00–0.07)
Basophils Absolute: 0 K/uL (ref 0.0–0.1)
Basophils Relative: 1 %
Eosinophils Absolute: 0 K/uL (ref 0.0–0.5)
Eosinophils Relative: 0 %
HCT: 35.1 % — ABNORMAL LOW (ref 39.0–52.0)
Hemoglobin: 10.7 g/dL — ABNORMAL LOW (ref 13.0–17.0)
Immature Granulocytes: 8 %
Lymphocytes Relative: 22 %
Lymphs Abs: 1.5 K/uL (ref 0.7–4.0)
MCH: 27 pg (ref 26.0–34.0)
MCHC: 30.5 g/dL (ref 30.0–36.0)
MCV: 88.4 fL (ref 80.0–100.0)
Monocytes Absolute: 0.8 K/uL (ref 0.1–1.0)
Monocytes Relative: 11 %
Neutro Abs: 4 K/uL (ref 1.7–7.7)
Neutrophils Relative %: 58 %
Platelet Count: 223 K/uL (ref 150–400)
RBC: 3.97 MIL/uL — ABNORMAL LOW (ref 4.22–5.81)
RDW: 16.8 % — ABNORMAL HIGH (ref 11.5–15.5)
Smear Review: NORMAL
WBC Count: 6.9 K/uL (ref 4.0–10.5)
nRBC: 2 % — ABNORMAL HIGH (ref 0.0–0.2)

## 2024-03-05 LAB — PSA: Prostatic Specific Antigen: >1500 ng/mL — ABNORMAL HIGH (ref 0.00–4.00)

## 2024-03-05 MED ORDER — SODIUM CHLORIDE 0.9 % IV SOLN
2.0000 g | Freq: Once | INTRAVENOUS | Status: AC
  Administered 2024-03-05: 2 g via INTRAVENOUS
  Filled 2024-03-05: qty 20

## 2024-03-05 MED ORDER — AZITHROMYCIN 500 MG PO TABS: 500.0000 mg | ORAL_TABLET | Freq: Every day | ORAL | 0 refills | Status: DC

## 2024-03-05 MED ORDER — ALTEPLASE 2 MG IJ SOLR
2.0000 mg | Freq: Once | INTRAMUSCULAR | Status: AC | PRN
  Administered 2024-03-05: 2 mg
  Filled 2024-03-05: qty 2

## 2024-03-05 MED ORDER — SODIUM CHLORIDE 0.9 % IV SOLN: INTRAVENOUS | Status: AC

## 2024-03-05 NOTE — Assessment & Plan Note (Addendum)
 Acute elevation of AST to 280 on December 19.  This was previously mildly elevated in the 40s but up to 88 in the ED on December 18.  This may be due to acute illness.  Acute hepatitis panel and CMV IgM were negative.  His AST has improved significantly to 80.  Still, I will have him hold his bee pollen and ketoconazole  as both could adversely affect the liver.  I will see him back next week with a CBC and comprehensive metabolic panel for repeat clinical assessment.

## 2024-03-05 NOTE — Patient Instructions (Signed)
Ceftriaxone Injection What is this medication? CEFTRIAXONE (sef try AX one) treats infections caused by bacteria. It belongs to a group of medications called cephalosporin antibiotics. It will not treat colds, the flu, or infections caused by viruses. This medicine may be used for other purposes; ask your health care provider or pharmacist if you have questions. COMMON BRAND NAME(S): Ceftri-IM, Ceftrisol Plus, Rocephin What should I tell my care team before I take this medication? They need to know if you have any of these conditions: Bleeding disorder High bilirubin level in newborn patients Kidney disease Liver disease Poor nutrition An unusual or allergic reaction to ceftriaxone, other penicillin or cephalosporin antibiotics, other medications, foods, dyes, or preservatives Pregnant or trying to get pregnant Breast-feeding How should I use this medication? This medication is injected into a vein or a muscle. It is usually given by your care team in a hospital or clinic setting. It may also be given at home. If you get this medication at home, you will be taught how to prepare and give it. Use exactly as directed. Take it as directed on the prescription label at the same time every day. Keep taking it even if you think you are better. It is important that you put your used needles and syringes in a special sharps container. Do not put them in a trash can. If you do not have a sharps container, call your pharmacist or care team to get one. Talk to your care team about the use of this medication in children. While it may be prescribed for children as young as newborns for selected conditions, precautions do apply. Overdosage: If you think you have taken too much of this medicine contact a poison control center or emergency room at once. NOTE: This medicine is only for you. Do not share this medicine with others. What if I miss a dose? If you get this medication at the hospital or clinic: It is  important not to miss your dose. Call your care team if you are unable to keep an appointment. If you give yourself this medication at home: If you miss a dose, take it as soon as you can. Then continue your normal schedule. If it is almost time for your next dose, take only that dose. Do not take double or extra doses. Call your care team with questions. What may interact with this medication? Estrogen or progestin hormones Intravenous calcium This list may not describe all possible interactions. Give your health care provider a list of all the medicines, herbs, non-prescription drugs, or dietary supplements you use. Also tell them if you smoke, drink alcohol, or use illegal drugs. Some items may interact with your medicine. What should I watch for while using this medication? Tell your care team if your symptoms do not start to get better or if they get worse. Do not treat diarrhea with over the counter products. Contact your care team if you have diarrhea that lasts more than 2 days or if it is severe and watery. If you have diabetes, you may get a false-positive result for sugar in your urine. Check with your care team. If you are being treated for a sexually transmitted infection (STI), avoid sexual contact until you have finished your treatment. Your partner may also need treatment. What side effects may I notice from receiving this medication? Side effects that you should report to your care team as soon as possible: Allergic reactions--skin rash, itching, hives, swelling of the face, lips, tongue, or  throat Hemolytic anemia--unusual weakness or fatigue, dizziness, headache, trouble breathing, dark urine, yellowing skin or eyes Severe diarrhea, fever Unusual vaginal discharge, itching, or odor Side effects that usually do not require medical attention (report to your care team if they continue or are bothersome): Diarrhea Headache Nausea Pain, redness, or irritation at injection  site This list may not describe all possible side effects. Call your doctor for medical advice about side effects. You may report side effects to FDA at 1-800-FDA-1088. Where should I keep my medication? Keep out of the reach of children and pets. You will be instructed on how to store this medication. Get rid of any unused medication after the expiration date. To get rid of medications that are no longer needed or have expired: Take the medication to a medication take-back program. Check with your pharmacy or law enforcement to find a location. If you cannot return the medication, ask your pharmacist or care team how to get rid of this medication safely. NOTE: This sheet is a summary. It may not cover all possible information. If you have questions about this medicine, talk to your doctor, pharmacist, or health care provider.  2024 Elsevier/Gold Standard (2021-05-11 00:00:00)

## 2024-03-05 NOTE — Assessment & Plan Note (Addendum)
 Nausea, vomiting and diarrhea since December 15.  He was seen in the ED December 18.  Flu, COVID and RSV were negative.  He received IV fluids and was discharged.  I saw him on December 19 as his symptoms had not resolved.  He was treated with IV fluids and antiemetics and instructed him to use his ondansetron  and prochlorperazine  at home.  His symptoms have resolved.  He is still clinically dehydrated, so we will give him IV fluids today.

## 2024-03-05 NOTE — Progress Notes (Signed)
 " [facility]  [address] [place],  [place]  [address] [phone]  Clinic Day:  03/05/2024  Referring physician: Paniagua, Tiziana, MD  ASSESSMENT & PLAN:   Assessment & Plan: Acute gastroenteritis Nausea, vomiting and diarrhea since December 15.  He was seen in the ED December 18.  Flu, COVID and RSV were negative.  He received IV fluids and was discharged.  I saw him on December 19 as his symptoms had not resolved.  He was treated with IV fluids and antiemetics and instructed him to use his ondansetron  and prochlorperazine  at home.  His symptoms have resolved.  He is still clinically dehydrated, so we will give him IV fluids today.  Elevated SGOT (AST) Acute elevation of AST to 280 on December 19.  This was previously mildly elevated in the 40s but up to 88 in the ED on December 18.  This may be due to acute illness.  Acute hepatitis panel and CMV IgM were negative.  His AST has improved significantly to 80.  Still, I will have him hold his bee pollen and ketoconazole  as both could adversely affect the liver.  I will see him back next week with a CBC and comprehensive metabolic panel for repeat clinical assessment.  Malignant neoplasm of prostate (HCC) Stage IVA prostate cancer with bone metastasis diagnosed in January 2016.  He has been through multiple lines of chemotherapy and complete androgen blockade and has had a significant increase in his PSA, now measured as greater than 1500.  PSMA PET in August was fairly stable.  He was placed on a trial of ketoconazole  in September and continued leuprolide  and zoledronic  acid.  He has remained fairly asymptomatic.  We plan to consider Pluvicto if he becomes more symptomatic from his bone metastasis.  He has had elevation of his AST most likely due to recent acute illness.  However, I asked him to hold his ketoconazole  and bee pollen until I see him again next week.    The patient understands the plans discussed today and  is in agreement with them.  He knows to contact our office if he develops concerns prior to his next appointment.   I provided 30 minutes of face-to-face time during this encounter and > 50% was spent counseling as documented under my assessment and plan.    Aidynn Polendo A Orlie Cundari, PA-C  [facility] - A DEPT OF [facility] [address] [place] [address] Dept: [phone] Dept Fax: [phone]   No orders of the defined types were placed in this encounter.     CHIEF COMPLAINT:  CC: Metastatic prostate cancer  Current Treatment: Ketoconazole  twice daily with leuprolide  and zoledronic  acid every 3 months  HISTORY OF PRESENT ILLNESS:  Marvin Johnson is a 73 year old with a history of stage IVA prostate cancer including spread of his disease to his bones diagnosed in January 2016.  He was initially treated with leuprolide  and zoledronic  acid.  He had progressive disease in the bone and lung in September 2023, so was placed on docetaxel  chemotherapy and continued leuprolide  and zoledronic  acid.  His PSA went down from 536 to 400.75 after 3 cycles, then to 284 with the 5th cycle, and then 203 in March 2024. PSA then went up to 261 in May. We decided to give him a break from chemotherapy at that time.  We continued leuprolide  and zoledronic  acid every 3 months. The PSA was 1156  in August 2024. PSMA PET in September 2024 revealed progression of skeletal metastasis.    He was placed on palliative chemotherapy with cabazitaxel /prednisone  until June 2025. His PSA went steadily down until March when it was 371.  The PSA then started rising, to 822.82 in June 2025 and then to 1,013.69 within a month, and then to 1,061 one month later. It has now been over 1,500 since August 2025 and I do not have specific readings. PET scan done in August revealed the activity within the multiple skeletal lesions was slightly decreased; however, the individual  lesions had slightly expanded in radiotracer activity tissue involvement.  There was no new foci of disease, no lymphadenopathy or visceral metastasis, and the overall minimal interval change in widespread metastatic prostate skeletal disease. We continued Lupron  injections.  He was placed on a trial of ketoconazole  in September since he has been through almost everything else, except for Pluvicto. We plan consider this when he becomes more symptomatic from his bone metastasis.    Oncology History  Malignant neoplasm of prostate (HCC)  03/30/2014 Cancer Staging   Staging form: Prostate, AJCC 8th Edition - Clinical stage from 03/30/2014: Stage IVA (cT3b, cN1, cM0, PSA: 51.8, Grade Group: 4) - Signed by Cornelius Wanda DEL, MD on 11/14/2020 Histopathologic type: Adenocarcinoma, NOS Stage prefix: Initial diagnosis Prostate specific antigen (PSA) range: 20 or greater Gleason primary pattern: 4 Gleason secondary pattern: 4 Gleason score: 8 Histologic grading system: 5 grade system Location of positive needle core biopsies: Beyond primary site Prognostic indicators: May 2016 Robotic prostatectomy with mult. Pos margins, extra prostatic extension, 2/6 pos nodes. Scan June 2016 positive at T10  PSA up to 216.3 by August Placed on lupron  and casodex Stage used in treatment planning: Yes National guidelines used in treatment planning: Yes Type of national guideline used in treatment planning: NCCN   02/18/2020 Initial Diagnosis   Malignant neoplasm of prostate (HCC)   12/01/2021 - 07/19/2022 Chemotherapy   Patient is on Treatment Plan : PROSTATE Docetaxel  (75) + Prednisone  q21d     12/21/2022 - 08/26/2023 Chemotherapy   Patient is on Treatment Plan : PROSTATE Cabazitaxel  (20) D1 + Prednisone  D1-21 q21d     Secondary malignant neoplasm of bone and bone marrow (HCC)  02/18/2020 Initial Diagnosis   Secondary malignant neoplasm of bone and bone marrow (HCC)   12/01/2021 - 07/19/2022 Chemotherapy   Patient  is on Treatment Plan : PROSTATE Docetaxel  (75) + Prednisone  q21d     12/21/2022 - 08/26/2023 Chemotherapy   Patient is on Treatment Plan : PROSTATE Cabazitaxel  (20) D1 + Prednisone  D1-21 q21d     Malignant neoplasm of prostate metastatic to lung (HCC)  11/25/2021 Initial Diagnosis   Malignant neoplasm of prostate metastatic to lung (HCC)   12/21/2022 - 08/26/2023 Chemotherapy   Patient is on Treatment Plan : PROSTATE Cabazitaxel  (20) D1 + Prednisone  D1-21 q21d         INTERVAL HISTORY:   Jaymes is here today for repeat clinical assessment.  He was seen on Friday after being in the ED with nausea, vomiting diarrhea.  He received IV antiemetics and IV fluids in clinic and was discharged home.  He states his GI symptoms all resolved as of yesterday.  He remains weak.  He is eating better, but still not drinking much.  He now reports a cough productive of greenish sputum.  He denies fevers or chills. He denies pain. His appetite is fairly good. His weight has been stable.  He states  he resumed ketoconazole  on Friday, but is only taking this once a day.  He states he continues prednisone  twice daily.  He states he has been on bee pollen for many years.  REVIEW OF SYSTEMS:   Review of Systems  Constitutional:  Positive for fatigue. Negative for appetite change, chills, fever and unexpected weight change.  HENT:   Negative for lump/mass, mouth sores and sore throat.   Respiratory:  Negative for cough and shortness of breath.   Cardiovascular:  Negative for chest pain and leg swelling.  Gastrointestinal:  Negative for abdominal pain, constipation, diarrhea, nausea and vomiting.  Genitourinary:  Negative for difficulty urinating, dysuria, frequency and hematuria.   Musculoskeletal:  Negative for arthralgias, back pain and myalgias.  Skin:  Negative for itching, rash and wound.  Neurological:  Negative for dizziness, extremity weakness, headaches, light-headedness and numbness.  Hematological:   Negative for adenopathy.  Psychiatric/Behavioral:  Negative for depression and sleep disturbance. The patient is not nervous/anxious.      VITALS:   Blood pressure (!) 126/54, pulse 67, temperature 98 F (36.7 C), temperature source Oral, resp. rate 18, height 5' 7 (1.702 m), weight 171 lb 14.4 oz (78 kg), SpO2 97%.  Wt Readings from Last 3 Encounters:  03/05/24 171 lb 14.4 oz (78 kg)  02/21/24 180 lb 3.2 oz (81.7 kg)  01/24/24 178 lb 9.6 oz (81 kg)    Body mass index is 26.92 kg/m.  Performance status (ECOG): 1 - Symptomatic but completely ambulatory    PHYSICAL EXAM:   Physical Exam Vitals and nursing note reviewed.  Constitutional:      General: He is not in acute distress.    Appearance: Normal appearance. He is normal weight. He is ill-appearing (mildly weak appearing).  HENT:     Head: Normocephalic and atraumatic.     Mouth/Throat:     Mouth: Mucous membranes are dry.     Pharynx: Oropharynx is clear. No oropharyngeal exudate or posterior oropharyngeal erythema.  Eyes:     General: No scleral icterus.    Extraocular Movements: Extraocular movements intact.     Conjunctiva/sclera: Conjunctivae normal.     Pupils: Pupils are equal, round, and reactive to light.  Cardiovascular:     Rate and Rhythm: Normal rate and regular rhythm.     Heart sounds: Normal heart sounds. No murmur heard.    No friction rub. No gallop.  Pulmonary:     Effort: Pulmonary effort is normal.     Breath sounds: Normal breath sounds. No wheezing, rhonchi or rales.  Abdominal:     General: Bowel sounds are normal. There is no distension.     Palpations: Abdomen is soft. There is no mass.     Tenderness: There is no abdominal tenderness.  Musculoskeletal:        General: Normal range of motion.     Cervical back: Normal range of motion and neck supple. No tenderness.     Right lower leg: No edema.     Left lower leg: No edema.  Lymphadenopathy:     Cervical: No cervical adenopathy.   Skin:    General: Skin is warm and dry.     Coloration: Skin is not jaundiced.     Findings: No rash.     Comments: Skin turgor is decreased  Neurological:     Mental Status: He is alert and oriented to person, place, and time.     Cranial Nerves: No cranial nerve deficit.  Psychiatric:  Mood and Affect: Mood normal.        Behavior: Behavior normal.        Thought Content: Thought content normal.     LABS:      Latest Ref Rng & Units 03/05/2024    2:20 PM 03/02/2024   12:47 PM 02/21/2024    7:58 AM  CBC  WBC 4.0 - 10.5 K/uL 6.9  7.9  8.0   Hemoglobin 13.0 - 17.0 g/dL 89.2  89.0  89.0   Hematocrit 39.0 - 52.0 % 35.1  35.2  35.5   Platelets 150 - 400 K/uL 223  207  229       Latest Ref Rng & Units 03/05/2024    2:20 PM 03/02/2024   12:47 PM 02/21/2024    7:58 AM  CMP  Glucose 70 - 99 mg/dL 849  862  862   BUN 8 - 23 mg/dL 12  13  15    Creatinine 0.61 - 1.24 mg/dL 9.29  9.47  9.19   Sodium 135 - 145 mmol/L 139  137  138   Potassium 3.5 - 5.1 mmol/L 3.7  3.8  4.3   Chloride 98 - 111 mmol/L 104  103  101   CO2 22 - 32 mmol/L 25  22  27    Calcium 8.9 - 10.3 mg/dL 8.3  8.5  8.9   Total Protein 6.5 - 8.1 g/dL 6.3  6.4  6.3   Total Bilirubin 0.0 - 1.2 mg/dL 0.3  0.5  0.3   Alkaline Phos 38 - 126 U/L 503  600  516   AST 15 - 41 U/L 80  280  47   ALT 0 - 44 U/L 10  14  15      Lab Results  Component Value Date   TIBC 398 01/29/2022   FERRITIN 206 01/29/2022   IRONPCTSAT 25 01/29/2022     STUDIES:   No results found.    HISTORY:   Past Medical History:  Diagnosis Date   Hyperlipidemia    Hypokalemia 03/24/2023   Malignant neoplasm of prostate (HCC)    Supraventricular tachycardia     Past Surgical History:  Procedure Laterality Date   CORONARY STENT INTERVENTION N/A 02/08/2022   Procedure: CORONARY STENT INTERVENTION;  Surgeon: Mady Bruckner, MD;  Location: [facility] INVASIVE CV [facility];  Service: Cardiovascular;  Laterality: N/A;   LEFT HEART CATH AND  CORONARY ANGIOGRAPHY N/A 02/08/2022   Procedure: LEFT HEART CATH AND CORONARY ANGIOGRAPHY;  Surgeon: Mady Bruckner, MD;  Location: [facility] INVASIVE CV LAB;  Service: Cardiovascular;  Laterality: N/A;   PROSTATECTOMY  07/2014   TONSILLECTOMY      Family History  Problem Relation Age of Onset   Breast cancer Mother 66   Ovarian cancer Mother 40   Kidney Stones Sister    Breast cancer Paternal Grandmother 38    Social History:  reports that he has never smoked. He has never used smokeless tobacco. He reports that he does not currently use alcohol. He reports that he does not use drugs.The patient is accompanied by Oneil Getting today.  Allergies: Allergies[1]  Current Medications: Current Outpatient Medications  Medication Sig Dispense Refill   azithromycin  (ZITHROMAX ) 500 MG tablet Take 1 tablet (500 mg total) by mouth daily. 7 tablet 0   Alirocumab 75 MG/ML SOAJ Inject 75 mg into the skin.     Cholecalciferol 25 MCG (1000 UT) tablet Take 1,000 Units by mouth in the morning and at bedtime.     clopidogrel  (  PLAVIX ) 75 MG tablet Take 75 mg by mouth daily.     diphenhydrAMINE  (BENADRYL ) 25 mg capsule Take 1 capsule by mouth at bedtime.     diphenhydrAMINE  (BENADRYL ) 50 MG capsule Take 50 mg by mouth at bedtime as needed.     ezetimibe  (ZETIA ) 10 MG tablet Take 10 mg by mouth daily.     guaiFENesin-codeine 100-10 MG/5ML syrup Take 5-10 mLs by mouth every 4 (four) hours as needed.     HYDROcodone -acetaminophen  (NORCO/VICODIN) 5-325 MG tablet Take 1-2 tablets by mouth every 4 (four) hours as needed for moderate pain (pain score 4-6). 30 tablet 0   isosorbide mononitrate (IMDUR) 30 MG 24 hr tablet Take 1 tablet by mouth every 8 (eight) hours as needed.     ketoconazole  (NIZORAL ) 200 MG tablet Take 1 tablet (200 mg total) by mouth 2 (two) times daily. 60 tablet 5   leuprolide  (LUPRON ) 22.5 MG injection Inject 22.5 mg into the muscle every 3 (three) months.     metoprolol  tartrate (LOPRESSOR ) 25 MG  tablet Take 0.5 tablets (12.5 mg total) by mouth 2 (two) times daily. 120 tablet 1   morphine  (MS CONTIN ) 30 MG 12 hr tablet Take 1 tablet (30 mg total) by mouth in the morning, at noon, and at bedtime. 90 tablet 0   Multiple Vitamins-Minerals (MULTIVITAMIN WITH MINERALS) tablet Take 1 tablet by mouth daily.     nitroGLYCERIN  (NITROSTAT ) 0.4 MG SL tablet Place 0.4 mg under the tongue every 5 (five) minutes as needed for chest pain. Repeat every 5 minutes if needed for a total of 3 tablets in 15 minutes. If no relief, CALL 911.     Omega-3 Fatty Acids (FISH OIL) 1000 MG CAPS Take 2,000 mg by mouth in the morning and at bedtime.     ondansetron  (ZOFRAN ) 8 MG tablet Take 8 mg by mouth every 8 (eight) hours as needed for nausea or vomiting.     polyethylene glycol powder (GLYCOLAX/MIRALAX) 17 GM/SCOOP powder Take 1 Container by mouth once.     potassium chloride  (KLOR-CON  M) 10 MEQ tablet TAKE 1 TABLET(10 MEQ) BY MOUTH TWICE DAILY 180 tablet 3   predniSONE  (DELTASONE ) 5 MG tablet Take 1 tablet (5 mg total) by mouth 2 (two) times daily with a meal. 60 tablet 5   prochlorperazine  (COMPAZINE ) 10 MG tablet Take 1 tablet (10 mg total) by mouth every 6 (six) hours as needed for nausea or vomiting. 30 tablet 2   Royal Jelly-Bee Pollen-Ginseng (KOREAN GINSENG COMPLEX) 150-250-50 MG CAPS Take 1 capsule by mouth daily.     triamcinolone  (KENALOG ) 0.025 % cream Apply 1 Application topically 2 (two) times daily. 30 g 5   Zoledronic  Acid (ZOMETA ) 4 MG/100ML IVPB Inject 4 mg into the vein every 3 (three) months.     No current facility-administered medications for this visit.   Facility-Administered Medications Ordered in Other Visits  Medication Dose Route Frequency Provider Last Rate Last Admin   cefTRIAXone  (ROCEPHIN ) 2 g in sodium chloride  0.9 % 50 mL IVPB  2 g Intravenous Once Shey Yott A, PA-C 100 mL/hr at 03/05/24 1554 2 g at 03/05/24 1554   sodium chloride  flush (NS) 0.9 % injection 10 mL  10 mL  Intravenous PRN Cornelius Wanda DEL, MD   10 mL at 03/17/23 0953   sodium chloride  flush (NS) 0.9 % injection 10 mL  10 mL Intracatheter PRN Cornelius Wanda DEL, MD   10 mL at 06/15/23 1234   sodium chloride  flush (NS)  0.9 % injection 10 mL  10 mL Intravenous PRN Cornelius Wanda DEL, MD   10 mL at 07/06/23 9071   sodium chloride  flush (NS) 0.9 % injection 10 mL  10 mL Intravenous PRN Cornelius Wanda DEL, MD   10 mL at 07/27/23 1114   sodium chloride  flush (NS) 0.9 % injection 10 mL  10 mL Intracatheter PRN Cornelius Wanda DEL, MD   10 mL at 09/14/23 9046             [1]  Allergies Allergen Reactions   Atorvastatin    Rosuvastatin Other (See Comments)    Other reaction(s): Muscle pain   Simvastatin Other (See Comments)    Other reaction(s): Joint pain, Muscle pain   Sulfa Antibiotics    Sulfamethoxazole-Trimethoprim Other (See Comments)    Unknown as to interaction was a baby.   "

## 2024-03-05 NOTE — Assessment & Plan Note (Addendum)
 Stage IVA prostate cancer with bone metastasis diagnosed in January 2016.  He has been through multiple lines of chemotherapy and complete androgen blockade and has had a significant increase in his PSA, now measured as greater than 1500.  PSMA PET in August was fairly stable.  He was placed on a trial of ketoconazole  in September and continued leuprolide  and zoledronic  acid.  He has remained fairly asymptomatic.  We plan to consider Pluvicto if he becomes more symptomatic from his bone metastasis.  He has had elevation of his AST most likely due to recent acute illness.  However, I asked him to hold his ketoconazole  and bee pollen until I see him again next week.

## 2024-03-12 ENCOUNTER — Other Ambulatory Visit: Payer: Self-pay

## 2024-03-12 DIAGNOSIS — C7951 Secondary malignant neoplasm of bone: Secondary | ICD-10-CM

## 2024-03-12 MED ORDER — MORPHINE SULFATE ER 30 MG PO TBCR: 30.0000 mg | EXTENDED_RELEASE_TABLET | Freq: Three times a day (TID) | ORAL | 0 refills | Status: DC

## 2024-03-14 ENCOUNTER — Inpatient Hospital Stay

## 2024-03-14 ENCOUNTER — Inpatient Hospital Stay: Admitting: Hematology and Oncology

## 2024-03-14 VITALS — BP 135/69 | HR 83 | Temp 98.0°F | Resp 16 | Ht 67.0 in | Wt 169.8 lb

## 2024-03-14 VITALS — BP 113/58 | HR 84 | Resp 18

## 2024-03-14 DIAGNOSIS — C61 Malignant neoplasm of prostate: Secondary | ICD-10-CM | POA: Diagnosis not present

## 2024-03-14 DIAGNOSIS — R7401 Elevation of levels of liver transaminase levels: Secondary | ICD-10-CM

## 2024-03-14 DIAGNOSIS — C7951 Secondary malignant neoplasm of bone: Secondary | ICD-10-CM

## 2024-03-14 DIAGNOSIS — C7800 Secondary malignant neoplasm of unspecified lung: Secondary | ICD-10-CM | POA: Diagnosis not present

## 2024-03-14 DIAGNOSIS — E860 Dehydration: Secondary | ICD-10-CM

## 2024-03-14 LAB — CMP (CANCER CENTER ONLY)
ALT: 9 U/L (ref 0–44)
AST: 55 U/L — ABNORMAL HIGH (ref 15–41)
Albumin: 4.2 g/dL (ref 3.5–5.0)
Alkaline Phosphatase: 496 U/L — ABNORMAL HIGH (ref 38–126)
Anion gap: 13 (ref 5–15)
BUN: 10 mg/dL (ref 8–23)
CO2: 22 mmol/L (ref 22–32)
Calcium: 8.9 mg/dL (ref 8.9–10.3)
Chloride: 103 mmol/L (ref 98–111)
Creatinine: 0.66 mg/dL (ref 0.61–1.24)
GFR, Estimated: >60 mL/min
Glucose, Bld: 112 mg/dL — ABNORMAL HIGH (ref 70–99)
Potassium: 3.6 mmol/L (ref 3.5–5.1)
Sodium: 138 mmol/L (ref 135–145)
Total Bilirubin: 0.4 mg/dL (ref 0.0–1.2)
Total Protein: 6.7 g/dL (ref 6.5–8.1)

## 2024-03-14 LAB — CBC WITH DIFFERENTIAL (CANCER CENTER ONLY)
Abs Immature Granulocytes: 0.98 K/uL — ABNORMAL HIGH (ref 0.00–0.07)
Basophils Absolute: 0 K/uL (ref 0.0–0.1)
Basophils Relative: 0 %
Eosinophils Absolute: 0.1 K/uL (ref 0.0–0.5)
Eosinophils Relative: 0 %
HCT: 35.4 % — ABNORMAL LOW (ref 39.0–52.0)
Hemoglobin: 11 g/dL — ABNORMAL LOW (ref 13.0–17.0)
Immature Granulocytes: 9 %
Lymphocytes Relative: 30 %
Lymphs Abs: 3.5 K/uL (ref 0.7–4.0)
MCH: 26.7 pg (ref 26.0–34.0)
MCHC: 31.1 g/dL (ref 30.0–36.0)
MCV: 85.9 fL (ref 80.0–100.0)
Monocytes Absolute: 0.8 K/uL (ref 0.1–1.0)
Monocytes Relative: 7 %
Neutro Abs: 6.2 K/uL (ref 1.7–7.7)
Neutrophils Relative %: 54 %
Platelet Count: 231 K/uL (ref 150–400)
RBC: 4.12 MIL/uL — ABNORMAL LOW (ref 4.22–5.81)
RDW: 17.2 % — ABNORMAL HIGH (ref 11.5–15.5)
Smear Review: NORMAL
WBC Count: 11.5 K/uL — ABNORMAL HIGH (ref 4.0–10.5)
nRBC: 1.4 % — ABNORMAL HIGH (ref 0.0–0.2)

## 2024-03-14 LAB — PSA: Prostatic Specific Antigen: >1500 ng/mL — ABNORMAL HIGH (ref 0.00–4.00)

## 2024-03-14 MED ORDER — SODIUM CHLORIDE 0.9 % IV SOLN: INTRAVENOUS | Status: DC

## 2024-03-14 NOTE — Patient Instructions (Signed)
 Dehydration, Adult Dehydration is a condition in which there is not enough water or other fluids in the body. This happens when a person loses more fluids than they take in. Important organs cannot work right without the right amount of fluids. Any loss of fluids from the body can cause dehydration. Dehydration can be mild, worse, or very bad. It should be treated right away to keep it from getting very bad. What are the causes? Conditions that cause loss of water in the body. They include: Watery poop (diarrhea). Vomiting. Sweating a lot. Fever. Infection. Peeing (urinating) a lot. Not drinking enough fluids. Certain medicines, such as medicines that take extra fluid out of the body (diuretics). Lack of safe drinking water. Not being able to get enough water and food. What increases the risk? Having a long-term (chronic) illness that has not been treated the right way, such as: Diabetes. Heart disease. Kidney disease. Being 25 years of age or older. Having a disability. Living in a place that is high above the ground or sea (high in altitude). The thinner, drier air causes more fluid loss. Doing exercises that put stress on your body for a long time. Being active when in hot places. What are the signs or symptoms? Symptoms of dehydration depend on how bad it is. Mild or worse dehydration Thirst. Dry lips or dry mouth. Feeling dizzy or light-headed. Muscle cramps. Passing little pee or dark pee. Pee may be the color of tea. Headache. Very bad dehydration Changes in skin. Skin may: Be cold to the touch (clammy). Be blotchy or pale. Not go back to normal right after you pinch it and let it go. Little or no tears, pee, or sweat. Fast breathing. Low blood pressure. Weak pulse. Pulse that is more than 100 beats a minute when you are sitting still. Other changes, such as: Feeling very thirsty. Eyes that look hollow (sunken). Cold hands and feet. Being confused. Being very  tired (lethargic) or having trouble waking from sleep. Losing weight. Loss of consciousness. How is this treated? Treatment for this condition depends on how bad your dehydration is. Treatment should start right away. Do not wait until your condition gets very bad. Very bad dehydration is an emergency. You will need to go to a hospital. Mild or worse dehydration can be treated at home. You may be asked to: Drink more fluids. Drink an oral rehydration solution (ORS). This drink gives you the right amount of fluids, salts, and minerals (electrolytes). Very bad dehydration can be treated: With fluids through an IV tube. By correcting low levels of electrolytes in the body. By treating the problem that caused your dehydration. Follow these instructions at home: Oral rehydration solution If told by your doctor, drink an ORS: Make an ORS. Use instructions on the package. Start by drinking small amounts, about  cup (120 mL) every 5-10 minutes. Slowly drink more until you have had the amount that your doctor said to have.  Eating and drinking  Drink enough clear fluid to keep your pee pale yellow. If you were told to drink an ORS, finish the ORS first. Then, start slowly drinking other clear fluids. Drink fluids such as: Water. Do not drink only water. Doing that can make the salt (sodium) level in your body get too low. Water from ice chips you suck on. Fruit juice that you have added water to (diluted). Low-calorie sports drinks. Eat foods that have the right amounts of salts and minerals, such as bananas, oranges, potatoes,  tomatoes, or spinach. Do not drink alcohol. Avoid drinks that have caffeine or sugar. These include:: High-calorie sports drinks. Fruit juice that you did not add water to. Soda. Coffee or energy drinks. Avoid foods that are greasy or have a lot of fat or sugar. General instructions Take over-the-counter and prescription medicines only as told by your doctor. Do  not take sodium tablets. Doing that can make the salt level in your body get too high. Return to your normal activities as told by your doctor. Ask your doctor what activities are safe for you. Keep all follow-up visits. Your doctor may check and change your treatment. Contact a doctor if: You have pain in your belly (abdomen) and the pain: Gets worse. Stays in one place. You have a rash. You have a stiff neck. You get angry or annoyed more easily than normal. You are more tired or have a harder time waking than normal. You feel weak or dizzy. You feel very thirsty. Get help right away if: You have any symptoms of very bad dehydration. You vomit every time you eat or drink. Your vomiting gets worse, does not go away, or you vomit blood or green stuff. You are getting treatment, but symptoms are getting worse. You have a fever. You have a very bad headache. You have: Diarrhea that gets worse or does not go away. Blood in your poop (stool). This may cause poop to look black and tarry. No pee in 6-8 hours. Only a small amount of pee in 6-8 hours, and the pee is very dark. You have trouble breathing. These symptoms may be an emergency. Get help right away. Call 911. Do not wait to see if the symptoms will go away. Do not drive yourself to the hospital. This information is not intended to replace advice given to you by your health care provider. Make sure you discuss any questions you have with your health care provider. Document Revised: 09/28/2021 Document Reviewed: 09/28/2021 Elsevier Patient Education  2024 [employer].

## 2024-03-14 NOTE — Progress Notes (Signed)
 " [facility]  [address] [place],  [place]  [address] [phone]   Clinic Day: 03/14/2024  Referring physician: Paniagua, Tiziana, MD  ASSESSMENT & PLAN:  Assessment: Malignant neoplasm of prostate [facility]) Stage IVA prostate cancer diagnosed in January 2016.  He had progressive disease in the bone and lung in September 2023, so was placed on docetaxel  chemotherapy and continued leuprolide  and zoledronic  acid.  His PSA went down from 536 to 400.75 after 3 cycles, then to 284 with the 5th cycle, and then 203 in March 2024. PSA then went up to 261 in May. We decided to give him a break from chemotherapy at that time.  We continued leuprolide  and zolendronic acid every 3 months. The PSA was 1156 in August of 2024. PSMA PET in September 2024 revealed progression of skeletal metastasis. He was placed on palliative chemotherapy with cabazitaxel /prednisone  until June, 2025. His PSA went steadily down until March when it was 371. It then started rising, to 822.82 in June 2025 and then to 1,013.69 within a month, and then to 1,061 one month later. It has now been over 1,500 since August 2025 and I do not have specific readings. PET scan done on 10/26/2023 revealed his activity within the multiple skeletal lesions is slightly decreased; however, the individual lesions are slightly expanded in radiotracer activity tissue involvement. No new foci of disease, no lymphadenopathy or visceral metastasis, and the overall minimal interval change in widespread metastatic prostate skeletal disease. We will continue Lupron  injections and we have him on a trial of Ketoconazole  since he has been through almost everything else, except for Pluvicto. We will consider this when he becomes more symptomatic.     Secondary malignant neoplasm of bone and bone marrow (HCC) He continues zoledronic  acid every 3 months in addition to his cancer therapy.  We have stopped chemotherapy in June 2025 with  cabazitaxel /prednisone  due to progression of disease. He continues zoledronic  acid every 3 months and is due again today, 02/21/2024. He had some escalation of his pain last month, and we increased his MS Contin  to 30 mg every 8 hours and it has remained controlled.   Abnormal radiologic findings on diagnostic imaging of renal pelvis, ureter, or bladder CTA abdomen and CT urogram were done in the ER at [facility] last Spring to evaluate hematuria.  These revealed increased bilateral ureteral mucosal enhancement with mild bilateral hydroureter, left greater than right, with a possible enhancing 3 mm lesion within the mid right ureter. Possible asymmetric right inferolateral bladder wall thickening. Evaluation with cystoscopy was negative by Dr. Lyell, urology at [facility]. She performed cystoscopy, bilateral retrograde pyelogram, bilateral ureteroscopy and with ureteral stent placement in May.  Evaluation was negative.  The ureteral stent was later removed.   Anemia He had an upper GI hemorrhage in early 2025 and was referred to Dr. Larene. Fortunately, he just had the one episode and was transfused. His hemoglobin came up to 11.1 and has remained stable. He had a EGD scheduled in April, but it was canceled. His cardiologist Dr. Court was not comfortable with him stopping clopidogrel  for another year. His bleeding has stopped and so we did not pursue an EGD at that time. His hemoglobin did come up to 12.4 then was relatively stable. It is down to 10.9 today. He denies any overt bleeding.   Plan: His gastritis has improved since last week as well as his liver enzymes. We will plan for IVF today for continued hydration. He  will return to clinic 01/22 for repeat evaluation with Dr. Cornelius.   The patient understands and agrees with this plan of care. He knows to call if he has concerns sooner.  I provided 20 minutes of face-to-face time during this encounter and > 50% was spent counseling as  documented under my assessment and plan.   Eleanor Bach, FNP- [facility]  [facility] A DEPT OF [facility]. [facility] [address] [place] [address] Dept: [phone] Dept Fax: [phone]   No orders of the defined types were placed in this encounter.   CHIEF COMPLAINT:  CC: Stage IVA prostate cancer  Current Treatment: leuprolide  and zoledronic  acid  HISTORY OF PRESENT ILLNESS:   Oncology History  Malignant neoplasm of prostate (HCC)  03/30/2014 Cancer Staging   Staging form: Prostate, AJCC 8th Edition - Clinical stage from 03/30/2014: Stage IVA (cT3b, cN1, cM0, PSA: 51.8, Grade Group: 4) - Signed by Cornelius Wanda VEAR, MD on 11/14/2020 Histopathologic type: Adenocarcinoma, NOS Stage prefix: Initial diagnosis Prostate specific antigen (PSA) range: 20 or greater Gleason primary pattern: 4 Gleason secondary pattern: 4 Gleason score: 8 Histologic grading system: 5 grade system Location of positive needle core biopsies: Beyond primary site Prognostic indicators: May 2016 Robotic prostatectomy with mult. Pos margins, extra prostatic extension, 2/6 pos nodes. Scan June 2016 positive at T10  PSA up to 216.3 by August Placed on lupron  and casodex Stage used in treatment planning: Yes National guidelines used in treatment planning: Yes Type of national guideline used in treatment planning: NCCN   02/18/2020 Initial Diagnosis   Malignant neoplasm of prostate (HCC)   12/01/2021 - 07/19/2022 Chemotherapy   Patient is on Treatment Plan : PROSTATE Docetaxel  (75) + Prednisone  q21d     12/21/2022 - 08/26/2023 Chemotherapy   Patient is on Treatment Plan : PROSTATE Cabazitaxel  (20) D1 + Prednisone  D1-21 q21d     Secondary malignant neoplasm of bone and bone marrow (HCC)  02/18/2020 Initial Diagnosis   Secondary malignant neoplasm of bone and bone marrow (HCC)   12/01/2021 - 07/19/2022 Chemotherapy   Patient is on Treatment Plan : PROSTATE  Docetaxel  (75) + Prednisone  q21d     12/21/2022 - 08/26/2023 Chemotherapy   Patient is on Treatment Plan : PROSTATE Cabazitaxel  (20) D1 + Prednisone  D1-21 q21d     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 Patient states that he feels well, but complains of increased fatigue and has no complaints of pain. He reports that his pain is well controlled with MS Contin  30 mg TID. He continues Lupron  and Zometa  every 3 months and will receive these today. We discussed the option of referring for Pluvicto at [facility]. He has a WBC of 8.0, a low hemoglobin of 10.9 down from 12.1, and platelet count of 229,000. His CMP revealed a low total protein of 6.3 down from 7.1, an elevated AST of 47 improved from 49, and an elevated alkaline phosphatase of 516 up from 339. This has been steadily rising. His PSA is pending, but has remained over 1,500 for months now. I encouraged him to increase protein in his diet.  I will see him back in 6 weeks with CBC, CMP, and PSA. We will decide on referral for Pluvicto at [facility] at that time. We will hold off on repeat PSMA PET scan since he clinically remains stable and we would need that prior to starting any new therapy.  He denies fever, chills, night sweats, or other signs of infection. He denies cardiorespiratory and gastrointestinal issues. He  denies pain. His appetite is good and His weight has increased 2 pounds over last 4 weeks.   REVIEW OF SYSTEMS:  Review of Systems  Constitutional:  Positive for fatigue. Negative for appetite change, chills, diaphoresis, fever and unexpected weight change.  HENT:  Negative.  Negative for hearing loss,  lump/mass, mouth sores, nosebleeds, sore throat, tinnitus, trouble swallowing and voice change.   Eyes: Negative.  Negative for eye problems and icterus.  Respiratory: Negative.  Negative for chest tightness, cough, hemoptysis, shortness of breath and wheezing.   Cardiovascular: Negative.  Negative for chest pain, leg swelling and palpitations.  Gastrointestinal: Negative.  Negative for abdominal distention, abdominal pain, blood in stool, constipation, diarrhea, nausea and vomiting.  Endocrine: Negative.  Negative for hot flashes.  Genitourinary: Negative.  Negative for bladder incontinence, difficulty urinating, dyspareunia, dysuria, frequency, hematuria, nocturia, pelvic pain and penile discharge.   Musculoskeletal:  Positive for arthralgias (chronic, well-controlled) and back pain (chronic, well-controlled). Negative for flank pain, gait problem, myalgias, neck pain and neck stiffness.  Skin:  Negative for itching, rash and wound.  Neurological:  Positive for numbness (of both lower legs). Negative for dizziness, extremity weakness, gait problem, headaches, light-headedness, seizures and speech difficulty.  Hematological: Negative.  Negative for adenopathy. Does not bruise/bleed easily.  Psychiatric/Behavioral: Negative.  Negative for confusion, depression, sleep disturbance and suicidal ideas. The patient is not nervous/anxious.     VITALS:  Blood pressure 135/69, pulse 83, temperature 98 F (36.7 C), temperature source Oral, resp. rate 16, height 5' 7 (1.702 m), weight 169 lb 12.8 oz (77 kg), SpO2 97%.  Wt Readings from Last 3 Encounters:  03/14/24 169 lb 12.8 oz (77 kg)  03/05/24 171 lb 14.4 oz (78 kg)  02/21/24 180 lb 3.2 oz (81.7 kg)  Body mass index is 26.59 kg/m.  Performance status (ECOG): 1 - Symptomatic but completely ambulatory  PHYSICAL EXAM:  Physical Exam Vitals and nursing note reviewed.  Constitutional:      General: He is not in acute distress.    Appearance:  Normal appearance. He is normal weight. He is not ill-appearing, toxic-appearing or diaphoretic.  HENT:     Head: Normocephalic and atraumatic.     Right Ear: Tympanic membrane, ear canal and external ear normal. There is no impacted cerumen.     Left Ear: Tympanic membrane, ear canal and external ear normal. There is no impacted cerumen.     Nose: Nose normal. No congestion or rhinorrhea.     Mouth/Throat:     Mouth: Mucous membranes are moist.     Pharynx: Oropharynx is clear. No oropharyngeal exudate or posterior oropharyngeal erythema.  Eyes:     General: No scleral icterus.       Right eye: No discharge.        Left eye: No discharge.     Extraocular Movements: Extraocular movements intact.     Conjunctiva/sclera: Conjunctivae normal.     Pupils: Pupils are equal, round, and reactive to light.  Cardiovascular:     Rate and Rhythm: Normal rate and regular rhythm.  Pulses: Normal pulses.     Heart sounds: Normal heart sounds. No murmur heard.    No friction rub. No gallop.  Pulmonary:     Effort: Pulmonary effort is normal. No respiratory distress.     Breath sounds: Normal breath sounds. No stridor. No wheezing, rhonchi or rales.  Chest:     Chest wall: No tenderness.  Abdominal:     General: Bowel sounds are normal. There is no distension.     Palpations: Abdomen is soft. There is no hepatomegaly, splenomegaly or mass.     Tenderness: There is no abdominal tenderness. There is no right CVA tenderness, left CVA tenderness, guarding or rebound.     Hernia: No hernia is present.  Musculoskeletal:        General: Normal range of motion.     Cervical back: Normal range of motion and neck supple. No tenderness.     Right lower leg: Edema (trace) present.     Left lower leg: Edema (trace) present.  Lymphadenopathy:     Cervical: No cervical adenopathy.     Upper Body:     Right upper body: No supraclavicular or axillary adenopathy.     Left upper body: No supraclavicular or  axillary adenopathy.     Lower Body: No right inguinal adenopathy. No left inguinal adenopathy.  Skin:    General: Skin is warm and dry.     Coloration: Skin is not jaundiced or pale.     Findings: No bruising, erythema, lesion or rash.  Neurological:     General: No focal deficit present.     Mental Status: He is alert and oriented to person, place, and time. Mental status is at baseline.     Cranial Nerves: No cranial nerve deficit.  Psychiatric:        Mood and Affect: Mood normal.        Behavior: Behavior normal.        Thought Content: Thought content normal.        Judgment: Judgment normal.    LABS:      Latest Ref Rng & Units 03/14/2024    8:26 AM 03/05/2024    2:20 PM 03/02/2024   12:47 PM  CBC  WBC 4.0 - 10.5 K/uL 11.5  6.9  7.9   Hemoglobin 13.0 - 17.0 g/dL 88.9  89.2  89.0   Hematocrit 39.0 - 52.0 % 35.4  35.1  35.2   Platelets 150 - 400 K/uL 231  223  207       Latest Ref Rng & Units 03/14/2024    8:26 AM 03/05/2024    2:20 PM 03/02/2024   12:47 PM  CMP  Glucose 70 - 99 mg/dL 887  849  862   BUN 8 - 23 mg/dL 10  12  13    Creatinine 0.61 - 1.24 mg/dL 9.33  9.29  9.47   Sodium 135 - 145 mmol/L 138  139  137   Potassium 3.5 - 5.1 mmol/L 3.6  3.7  3.8   Chloride 98 - 111 mmol/L 103  104  103   CO2 22 - 32 mmol/L 22  25  22    Calcium 8.9 - 10.3 mg/dL 8.9  8.3  8.5   Total Protein 6.5 - 8.1 g/dL 6.7  6.3  6.4   Total Bilirubin 0.0 - 1.2 mg/dL 0.4  0.3  0.5   Alkaline Phos 38 - 126 U/L 496  503  600   AST 15 - 41 U/L  55  80  280   ALT 0 - 44 U/L 9  10  14     Component Ref Range & Units (hover) 4 wk ago (11/23/23) 2 mo ago (10/14/23) 3 mo ago (09/14/23) 3 mo ago (08/24/23) 4 mo ago (07/27/23) 5 mo ago (07/06/23) 6 mo ago (06/15/23)  Prostatic Specific Antigen >1,500.00 High  >1,500.00 High  CM 1,061.05 High  CM 1,013.69 High  CM 822.82 High  CM 438.43 High  CM 426.49 High  CM   Lab Results  Component Value Date   PSA1 1,362.0 (H) 02/01/2023   PSA1 1,317.0  (H) 01/06/2023   PSA1 334.0 (H) 10/30/2021   Lab Results  Component Value Date   TIBC 398 01/29/2022   FERRITIN 206 01/29/2022   IRONPCTSAT 25 01/29/2022   STUDIES:   EXAM: 10/26/2023 NUCLEAR MEDICINE PET SKULL BASE TO THIGH IMPRESSION: 1. Activity within the multiple skeletal lesions is slightly decreased; however, the individual lesions are slightly expanded in radiotracer activity tissue involvement. Overall minimal interval change in widespread metastatic prostate skeletal disease. 2. No new foci of disease. 3. No lymphadenopathy or visceral metastasis.  Exam: 06/01/2023 CT ABDOMEN AND PELVIS WITHOUT CONTRAST IMPRESSIONS: Left-sided hyfrouteronephrosis which was not previously present. Wuestionable soft tissue lesion in the distal ureter on the left. There may be a mild amount of stranding around the left kidney. There is some mild stranding around the right ureter of uncertain significance as well. Correlate clinically/with laboratory findings. These findings were not previously present. A CT urogram may provide additional information.     HISTORY:   Past Medical History:  Diagnosis Date   Hyperlipidemia    Hypokalemia 03/24/2023   Malignant neoplasm of prostate (HCC)    Supraventricular tachycardia     Past Surgical History:  Procedure Laterality Date   CORONARY STENT INTERVENTION N/A 02/08/2022   Procedure: CORONARY STENT INTERVENTION;  Surgeon: Mady Bruckner, MD;  Location: [facility] INVASIVE CV LAB;  Service: Cardiovascular;  Laterality: N/A;   LEFT HEART CATH AND CORONARY ANGIOGRAPHY N/A 02/08/2022   Procedure: LEFT HEART CATH AND CORONARY ANGIOGRAPHY;  Surgeon: Mady Bruckner, MD;  Location: [facility] INVASIVE CV LAB;  Service: Cardiovascular;  Laterality: N/A;   PROSTATECTOMY  07/2014   TONSILLECTOMY      Family History  Problem Relation Age of Onset   Breast cancer Mother 24   Ovarian cancer Mother 53   Kidney Stones Sister    Breast cancer Paternal  Grandmother 64    Social History:  reports that he has never smoked. He has never used smokeless tobacco. He reports that he does not currently use alcohol. He reports that he does not use drugs.The patient is alone today.  Allergies:  Allergies  Allergen Reactions   Atorvastatin    Rosuvastatin Other (See Comments)    Other reaction(s): Muscle pain   Simvastatin Other (See Comments)    Other reaction(s): Joint pain, Muscle pain   Sulfa Antibiotics    Sulfamethoxazole-Trimethoprim Other (See Comments)    Unknown as to interaction was a baby.    Current Medications: Current Outpatient Medications  Medication Sig Dispense Refill   Alirocumab 75 MG/ML SOAJ Inject 75 mg into the skin.     azithromycin  (ZITHROMAX ) 500 MG tablet Take 1 tablet (500 mg total) by mouth daily. 7 tablet 0   Cholecalciferol 25 MCG (1000 UT) tablet Take 1,000 Units by mouth in the morning and at bedtime.     clopidogrel  (PLAVIX ) 75 MG tablet Take 75 mg by mouth  daily.     diphenhydrAMINE  (BENADRYL ) 25 mg capsule Take 1 capsule by mouth at bedtime.     diphenhydrAMINE  (BENADRYL ) 50 MG capsule Take 50 mg by mouth at bedtime as needed.     ezetimibe  (ZETIA ) 10 MG tablet Take 10 mg by mouth daily.     guaiFENesin-codeine 100-10 MG/5ML syrup Take 5-10 mLs by mouth every 4 (four) hours as needed.     HYDROcodone -acetaminophen  (NORCO/VICODIN) 5-325 MG tablet Take 1-2 tablets by mouth every 4 (four) hours as needed for moderate pain (pain score 4-6). 30 tablet 0   isosorbide mononitrate (IMDUR) 30 MG 24 hr tablet Take 1 tablet by mouth every 8 (eight) hours as needed.     ketoconazole  (NIZORAL ) 200 MG tablet Take 1 tablet (200 mg total) by mouth 2 (two) times daily. 60 tablet 5   leuprolide  (LUPRON ) 22.5 MG injection Inject 22.5 mg into the muscle every 3 (three) months.     metoprolol  tartrate (LOPRESSOR ) 25 MG tablet Take 0.5 tablets (12.5 mg total) by mouth 2 (two) times daily. 120 tablet 1   morphine  (MS CONTIN ) 30  MG 12 hr tablet Take 1 tablet (30 mg total) by mouth in the morning, at noon, and at bedtime. 90 tablet 0   Multiple Vitamins-Minerals (MULTIVITAMIN WITH MINERALS) tablet Take 1 tablet by mouth daily.     nitroGLYCERIN  (NITROSTAT ) 0.4 MG SL tablet Place 0.4 mg under the tongue every 5 (five) minutes as needed for chest pain. Repeat every 5 minutes if needed for a total of 3 tablets in 15 minutes. If no relief, CALL 911.     Omega-3 Fatty Acids (FISH OIL) 1000 MG CAPS Take 2,000 mg by mouth in the morning and at bedtime.     ondansetron  (ZOFRAN ) 8 MG tablet Take 8 mg by mouth every 8 (eight) hours as needed for nausea or vomiting.     polyethylene glycol powder (GLYCOLAX/MIRALAX) 17 GM/SCOOP powder Take 1 Container by mouth once.     potassium chloride  (KLOR-CON  M) 10 MEQ tablet TAKE 1 TABLET(10 MEQ) BY MOUTH TWICE DAILY 180 tablet 3   predniSONE  (DELTASONE ) 5 MG tablet Take 1 tablet (5 mg total) by mouth 2 (two) times daily with a meal. 60 tablet 5   prochlorperazine  (COMPAZINE ) 10 MG tablet Take 1 tablet (10 mg total) by mouth every 6 (six) hours as needed for nausea or vomiting. 30 tablet 2   Royal Jelly-Bee Pollen-Ginseng (KOREAN GINSENG COMPLEX) 150-250-50 MG CAPS Take 1 capsule by mouth daily.     triamcinolone  (KENALOG ) 0.025 % cream Apply 1 Application topically 2 (two) times daily. 30 g 5   Zoledronic  Acid (ZOMETA ) 4 MG/100ML IVPB Inject 4 mg into the vein every 3 (three) months.     No current facility-administered medications for this visit.   Facility-Administered Medications Ordered in Other Visits  Medication Dose Route Frequency Provider Last Rate Last Admin   0.9 %  sodium chloride  infusion   Intravenous Continuous Harl Setter A, NP   Stopped at 03/14/24 1041   sodium chloride  flush (NS) 0.9 % injection 10 mL  10 mL Intravenous PRN Cornelius Wanda DEL, MD   10 mL at 03/17/23 0953   sodium chloride  flush (NS) 0.9 % injection 10 mL  10 mL Intracatheter PRN Cornelius Wanda DEL,  MD   10 mL at 06/15/23 1234   sodium chloride  flush (NS) 0.9 % injection 10 mL  10 mL Intravenous PRN Cornelius Wanda DEL, MD   10 mL at 07/06/23  9071   sodium chloride  flush (NS) 0.9 % injection 10 mL  10 mL Intravenous PRN Cornelius Wanda DEL, MD   10 mL at 07/27/23 1114   sodium chloride  flush (NS) 0.9 % injection 10 mL  10 mL Intracatheter PRN Cornelius Wanda DEL, MD   10 mL at 09/14/23 9046    I,Ganesh Deeg A Phynix Horton,acting as a scribe for Eleanor DELENA Bach, NP.,have documented all relevant documentation on the behalf of Eleanor DELENA Bach, NP,as directed by  Eleanor DELENA Bach, NP while in the presence of Eleanor DELENA Bach, NP.  I have reviewed this report as typed by the medical scribe, and it is complete and accurate.  "

## 2024-03-23 ENCOUNTER — Other Ambulatory Visit: Payer: Self-pay | Admitting: Hematology and Oncology

## 2024-03-23 DIAGNOSIS — R110 Nausea: Secondary | ICD-10-CM

## 2024-03-26 ENCOUNTER — Encounter: Payer: Self-pay | Admitting: Oncology

## 2024-04-03 ENCOUNTER — Ambulatory Visit (HOSPITAL_BASED_OUTPATIENT_CLINIC_OR_DEPARTMENT_OTHER)
Admission: RE | Admit: 2024-04-03 | Discharge: 2024-04-03 | Disposition: A | Source: Ambulatory Visit | Attending: Hematology and Oncology | Admitting: Hematology and Oncology

## 2024-04-03 DIAGNOSIS — R110 Nausea: Secondary | ICD-10-CM

## 2024-04-05 ENCOUNTER — Other Ambulatory Visit: Payer: Self-pay | Admitting: Oncology

## 2024-04-05 ENCOUNTER — Encounter: Payer: Self-pay | Admitting: Oncology

## 2024-04-05 ENCOUNTER — Inpatient Hospital Stay (HOSPITAL_BASED_OUTPATIENT_CLINIC_OR_DEPARTMENT_OTHER): Admitting: Oncology

## 2024-04-05 ENCOUNTER — Telehealth: Payer: Self-pay | Admitting: Oncology

## 2024-04-05 ENCOUNTER — Other Ambulatory Visit: Payer: Self-pay

## 2024-04-05 ENCOUNTER — Inpatient Hospital Stay: Attending: Oncology

## 2024-04-05 VITALS — BP 109/57 | HR 67 | Temp 97.5°F | Resp 18 | Ht 67.0 in | Wt 167.9 lb

## 2024-04-05 DIAGNOSIS — C7952 Secondary malignant neoplasm of bone marrow: Secondary | ICD-10-CM

## 2024-04-05 DIAGNOSIS — C7951 Secondary malignant neoplasm of bone: Secondary | ICD-10-CM | POA: Diagnosis not present

## 2024-04-05 DIAGNOSIS — D649 Anemia, unspecified: Secondary | ICD-10-CM

## 2024-04-05 DIAGNOSIS — C61 Malignant neoplasm of prostate: Secondary | ICD-10-CM

## 2024-04-05 DIAGNOSIS — C7800 Secondary malignant neoplasm of unspecified lung: Secondary | ICD-10-CM | POA: Diagnosis not present

## 2024-04-05 LAB — CBC WITH DIFFERENTIAL (CANCER CENTER ONLY)
Abs Immature Granulocytes: 0.53 K/uL — ABNORMAL HIGH (ref 0.00–0.07)
Basophils Absolute: 0 K/uL (ref 0.0–0.1)
Basophils Relative: 0 %
Eosinophils Absolute: 0 K/uL (ref 0.0–0.5)
Eosinophils Relative: 1 %
HCT: 32.7 % — ABNORMAL LOW (ref 39.0–52.0)
Hemoglobin: 10.1 g/dL — ABNORMAL LOW (ref 13.0–17.0)
Immature Granulocytes: 7 %
Lymphocytes Relative: 40 %
Lymphs Abs: 3 K/uL (ref 0.7–4.0)
MCH: 27.1 pg (ref 26.0–34.0)
MCHC: 30.9 g/dL (ref 30.0–36.0)
MCV: 87.7 fL (ref 80.0–100.0)
Monocytes Absolute: 0.6 K/uL (ref 0.1–1.0)
Monocytes Relative: 8 %
Neutro Abs: 3.3 K/uL (ref 1.7–7.7)
Neutrophils Relative %: 44 %
Platelet Count: 177 K/uL (ref 150–400)
RBC: 3.73 MIL/uL — ABNORMAL LOW (ref 4.22–5.81)
RDW: 18.3 % — ABNORMAL HIGH (ref 11.5–15.5)
Smear Review: NORMAL
WBC Count: 7.4 K/uL (ref 4.0–10.5)
nRBC: 2 % — ABNORMAL HIGH (ref 0.0–0.2)

## 2024-04-05 LAB — CMP (CANCER CENTER ONLY)
ALT: 10 U/L (ref 0–44)
AST: 88 U/L — ABNORMAL HIGH (ref 15–41)
Albumin: 3.7 g/dL (ref 3.5–5.0)
Alkaline Phosphatase: 695 U/L — ABNORMAL HIGH (ref 38–126)
Anion gap: 13 (ref 5–15)
BUN: 13 mg/dL (ref 8–23)
CO2: 22 mmol/L (ref 22–32)
Calcium: 8.4 mg/dL — ABNORMAL LOW (ref 8.9–10.3)
Chloride: 104 mmol/L (ref 98–111)
Creatinine: 0.66 mg/dL (ref 0.61–1.24)
GFR, Estimated: >60 mL/min
Glucose, Bld: 135 mg/dL — ABNORMAL HIGH (ref 70–99)
Potassium: 3.7 mmol/L (ref 3.5–5.1)
Sodium: 138 mmol/L (ref 135–145)
Total Bilirubin: 0.4 mg/dL (ref 0.0–1.2)
Total Protein: 6.4 g/dL — ABNORMAL LOW (ref 6.5–8.1)

## 2024-04-05 LAB — FERRITIN: Ferritin: 958 ng/mL — ABNORMAL HIGH (ref 24–336)

## 2024-04-05 LAB — VITAMIN B12: Vitamin B-12: 505 pg/mL (ref 180–914)

## 2024-04-05 LAB — PSA: Prostatic Specific Antigen: >1500 ng/mL — ABNORMAL HIGH (ref 0.00–4.00)

## 2024-04-05 LAB — IRON AND TIBC
Iron: 71 ug/dL (ref 45–182)
Saturation Ratios: 20 % (ref 17.9–39.5)
TIBC: 353 ug/dL (ref 250–450)
UIBC: 282 ug/dL

## 2024-04-05 LAB — FOLATE: Folate: 18.8 ng/mL

## 2024-04-05 NOTE — Progress Notes (Signed)
 " Marvin Johnson  [address] [place],  [place]  [address] [phone]   Clinic Day: 04/05/2024  Referring physician: Paniagua, Tiziana, MD  ASSESSMENT & PLAN:  Assessment: Malignant neoplasm of prostate [facility]) Stage IVA prostate cancer diagnosed in January 2016.  He had progressive disease in the bone and lung in September 2023, so was placed on docetaxel  chemotherapy and continued leuprolide  and zoledronic  acid.  His PSA went down from 536 to 400.75 after 3 cycles, then to 284 with the 5th cycle, and then 203 in March 2024. PSA then went up to 261 in May. We decided to give him a break from chemotherapy at that time.  We continued leuprolide  and zolendronic acid every 3 months. The PSA was 1156 in August of 2024. PSMA PET in September 2024 revealed progression of skeletal metastasis. He was placed on palliative chemotherapy with cabazitaxel /prednisone  until June, 2025. His PSA went steadily down until March when it was 371. It then started rising, to 822.82 in June 2025 and then to 1,013.69 within a month, and then to 1,061 one month later. It has now been over 1,500 since August 2025 and I do not have specific readings. PET scan done on 10/26/2023 revealed his activity within the multiple skeletal lesions is slightly decreased; however, the individual lesions are slightly expanded in radiotracer activity tissue involvement. No new foci of disease, no lymphadenopathy or visceral metastasis, and there is minimal interval change in the overall widespread metastatic prostate skeletal disease. We will continue Lupron  injections and Zometa . We have stopped the ketoconazole  and prednisone  due to the abnormal liver function tests. He has been through almost everything else, except for Pluvicto. We will consider this when he becomes more symptomatic.     Secondary malignant neoplasm of bone and bone marrow (HCC) He continues zoledronic  acid every 3 months in addition to his cancer therapy.  We  have stopped chemotherapy in June 2025 with cabazitaxel /prednisone  due to progression of disease. He continues zoledronic  acid every 3 months and is due again today, 02/21/2024. He had some escalation of his pain last month, and we increased his MS Contin  to 30 mg every 8 hours and it has remained controlled.   Anemia He had an upper GI hemorrhage in early 2025 and was referred to Dr. Larene. Fortunately, he just had the one episode and was transfused. His hemoglobin came up to 11.1 and has remained stable. He had a EGD scheduled in April, but it was canceled. His cardiologist Dr. Court was not comfortable with him stopping clopidogrel  for another year. His bleeding has stopped and so we did not pursue an EGD at that time. His hemoglobin did come up to 12.4 then was relatively stable. It has drifted down to 10.9 and is down to 10.1 today. I will therefore repeat anemia evaluation of B12, folate, and iron studies. He denies any overt bleeding.   Plan: He complains of aching leg pain and fatigue. His pain is controlled on the current regimen, with very little breakthrough pain. He continues Lupron  and Zometa  every 3 months. He currently takes 5 mg prednisone  BID. I instructed him to discontinue this and his ketoconazole . He knows to stay on his potassium supplements and I recommended calcium supplement once daily. He had a RUQ abdominal ultrasound on 04/03/2024 to evaluate his abnormal liver function tests which revealed no acute abnormality identified and mildly increased echotexture of the liver which is a nonspecific finding but can be seen in fatty infiltration of  liver. His liver feels normal on physical exam. We re-discussed the option of referring for Pluvicto at [facility]. He has a WBC of 7.4, a low hemoglobin of 10.1 down from 11.0, and platelet count of 177,0000. His CMP reveals a low calcium of 8.4 down from 8.9, a slightly low total protein of 6.4 down from 6.7, an elevated AST of 88 up form 55, and  an elevated alkaline phophatase of 695 up from 496. His PSA is pending. I will add B12, ferritin, folate, and iron studies to his labs today to evaluate his worsening anemia and call him with any abnormal results. I will see him back in 1 month with CBC, CMP, and PSA. He will be due for his next Lupron  and Zometa  injections in 2 months. The patient understands and agrees with this plan of care. He knows to call if he has concerns sooner.  I provided 18 minutes of face-to-face time during this encounter and > 50% was spent counseling as documented under my assessment and plan.   Marvin VEAR Cornish, MD  [facility] DE[facility] OF [facility] [address] [place] [address] Dept: [phone] Dept Fax: [phone]   No orders of the defined types were placed in this encounter.   CHIEF COMPLAINT:  CC: Stage IVA prostate cancer  Current Treatment: leuprolide  and zoledronic  acid  HISTORY OF PRESENT ILLNESS:   Oncology History  Malignant neoplasm of prostate (HCC)  03/30/2014 Cancer Staging   Staging form: Prostate, AJCC 8th Edition - Clinical stage from 03/30/2014: Stage IVA (cT3b, cN1, cM0, PSA: 51.8, Grade Group: 4) - Signed by Johnson Marvin VEAR, MD on 11/14/2020 Histopathologic type: Adenocarcinoma, NOS Stage prefix: Initial diagnosis Prostate specific antigen (PSA) range: 20 or greater Gleason primary pattern: 4 Gleason secondary pattern: 4 Gleason score: 8 Histologic grading system: 5 grade system Location of positive needle core biopsies: Beyond primary site Prognostic indicators: May 2016 Robotic prostatectomy with mult. Pos margins, extra prostatic extension, 2/6 pos nodes. Scan June 2016 positive at T10  PSA up to 216.3 by August Placed on lupron  and casodex Stage used in treatment planning: Yes National guidelines used in treatment planning: Yes Type of national guideline used in treatment planning: NCCN    02/18/2020 Initial Diagnosis   Malignant neoplasm of prostate (HCC)   12/01/2021 - 07/19/2022 Chemotherapy   Patient is on Treatment Plan : PROSTATE Docetaxel  (75) + Prednisone  q21d     12/21/2022 - 08/26/2023 Chemotherapy   Patient is on Treatment Plan : PROSTATE Cabazitaxel  (20) D1 + Prednisone  D1-21 q21d     Secondary malignant neoplasm of bone and bone marrow (HCC)  02/18/2020 Initial Diagnosis   Secondary malignant neoplasm of bone and bone marrow (HCC)   12/01/2021 - 07/19/2022 Chemotherapy   Patient is on Treatment Plan : PROSTATE Docetaxel  (75) + Prednisone  q21d     12/21/2022 - 08/26/2023 Chemotherapy   Patient is on Treatment Plan : PROSTATE Cabazitaxel  (20) D1 + Prednisone  D1-21 q21d     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 Patient states that he feels well, but complains of aching  leg pain and fatigue. His pain is controlled on the current regimen, with very little breakthrough pain. He continues Lupron  and Zometa  every 3 months. He currently takes 5 mg prednisone  BID. I instructed him to discontinue this and his ketoconazole . He knows to stay on his potassium supplements and I recommended calcium supplement once daily. He had a RUQ abdominal ultrasound on 04/03/2024 to evaluate his abnormal liver function tests which revealed no acute abnormality identified and mildly increased echotexture of the liver which is a nonspecific finding but can be seen in fatty infiltration of liver. His liver feels normal on physical exam. We re-discussed the option of referring for Pluvicto at [facility]. He has a WBC of 7.4, a low hemoglobin of 10.1 down from 11.0, and  platelet count of 177,0000. His CMP reveals a low calcium of 8.4 down from 8.9, a slightly low total protein of 6.4 down from 6.7, an elevated AST of 88 up form 55, and an elevated alkaline phophatase of 695 up from 496. His PSA is pending. I will add B12, ferritin, folate, and iron studies to his labs today to evaluate his worsening anemia and call him with any abnormal results. I will see him back in 1 month with CBC, CMP, and PSA. He will be due for his next Lupron  and Zometa  injections in 2 months.   He denies fever, chills, night sweats, or other signs of infection. He denies cardiorespiratory and gastrointestinal issues. His appetite is good and His weight has decreased 2 pounds over last 4 weeks.  REVIEW OF SYSTEMS:  Review of Systems  Constitutional:  Positive for fatigue. Negative for appetite change, chills, diaphoresis, fever and unexpected weight change.  HENT:  Negative.  Negative for hearing loss, lump/mass, mouth sores, nosebleeds, sore throat, tinnitus, trouble swallowing and voice change.   Eyes: Negative.  Negative for eye problems and icterus.  Respiratory: Negative.  Negative for chest tightness, cough, hemoptysis, shortness of breath and wheezing.   Cardiovascular: Negative.  Negative for chest pain, leg swelling and palpitations.  Gastrointestinal: Negative.  Negative for abdominal distention, abdominal pain, blood in stool, constipation, diarrhea, nausea and vomiting.  Endocrine: Negative.  Negative for hot flashes.  Genitourinary: Negative.  Negative for bladder incontinence, difficulty urinating, dyspareunia, dysuria, frequency, hematuria, nocturia, pelvic pain and penile discharge.   Musculoskeletal:  Positive for arthralgias (chronic, well-controlled), back pain (chronic, well-controlled) and myalgias (aching legs). Negative for flank pain, gait problem, neck pain and neck stiffness.  Skin:  Negative for itching, rash and wound.  Neurological:  Positive for numbness (of  both lower legs). Negative for dizziness, extremity weakness, gait problem, headaches, light-headedness, seizures and speech difficulty.  Hematological: Negative.  Negative for adenopathy. Does not bruise/bleed easily.  Psychiatric/Behavioral: Negative.  Negative for confusion, depression, sleep disturbance and suicidal ideas. The patient is not nervous/anxious.     VITALS:  Blood pressure (!) 109/57, pulse 67, temperature (!) 97.5 F (36.4 C), temperature source Oral, resp. rate 18, height 5' 7 (1.702 m), weight 167 lb 14.4 oz (76.2 kg), SpO2 98%.  Wt Readings from Last 3 Encounters:  04/05/24 167 lb 14.4 oz (76.2 kg)  03/14/24 169 lb 12.8 oz (77 kg)  03/05/24 171 lb 14.4 oz (78 kg)  Body mass index is 26.3 kg/m.  Performance status (ECOG): 1 - Symptomatic but completely ambulatory  PHYSICAL EXAM:  Physical Exam Vitals and nursing note reviewed.  Constitutional:      General: He is not in acute distress.    Appearance:  Normal appearance. He is normal weight. He is not ill-appearing, toxic-appearing or diaphoretic.  HENT:     Head: Normocephalic and atraumatic.     Right Ear: Tympanic membrane, ear canal and external ear normal. There is no impacted cerumen.     Left Ear: Tympanic membrane, ear canal and external ear normal. There is no impacted cerumen.     Nose: Nose normal. No congestion or rhinorrhea.     Mouth/Throat:     Mouth: Mucous membranes are moist.     Pharynx: Oropharynx is clear. No oropharyngeal exudate or posterior oropharyngeal erythema.  Eyes:     General: No scleral icterus.       Right eye: No discharge.        Left eye: No discharge.     Extraocular Movements: Extraocular movements intact.     Conjunctiva/sclera: Conjunctivae normal.     Pupils: Pupils are equal, round, and reactive to light.  Cardiovascular:     Rate and Rhythm: Normal rate and regular rhythm.     Pulses: Normal pulses.     Heart sounds: Normal heart sounds. No murmur heard.    No  friction rub. No gallop.  Pulmonary:     Effort: Pulmonary effort is normal. No respiratory distress.     Breath sounds: Normal breath sounds. No stridor. No wheezing, rhonchi or rales.  Chest:     Chest wall: No tenderness.  Abdominal:     General: Bowel sounds are normal. There is no distension.     Palpations: Abdomen is soft. There is no hepatomegaly, splenomegaly or mass.     Tenderness: There is no abdominal tenderness. There is no right CVA tenderness, left CVA tenderness, guarding or rebound.     Hernia: No hernia is present.  Musculoskeletal:        General: Normal range of motion.     Cervical back: Normal range of motion and neck supple. No tenderness.     Right lower leg: No edema.     Left lower leg: No edema.  Lymphadenopathy:     Cervical: No cervical adenopathy.     Upper Body:     Right upper body: No supraclavicular or axillary adenopathy.     Left upper body: No supraclavicular or axillary adenopathy.     Lower Body: No right inguinal adenopathy. No left inguinal adenopathy.  Skin:    General: Skin is warm and dry.     Coloration: Skin is not jaundiced or pale.     Findings: No bruising, erythema, lesion or rash.  Neurological:     General: No focal deficit present.     Mental Status: He is alert and oriented to person, place, and time. Mental status is at baseline.     Cranial Nerves: No cranial nerve deficit.  Psychiatric:        Mood and Affect: Mood normal.        Behavior: Behavior normal.        Thought Content: Thought content normal.        Judgment: Judgment normal.    LABS:      Latest Ref Rng & Units 04/05/2024    8:55 AM 03/14/2024    8:26 AM 03/05/2024    2:20 PM  CBC  WBC 4.0 - 10.5 K/uL 7.4  11.5  6.9   Hemoglobin 13.0 - 17.0 g/dL 89.8  88.9  89.2   Hematocrit 39.0 - 52.0 % 32.7  35.4  35.1   Platelets 150 -  400 K/uL 177  231  223       Latest Ref Rng & Units 04/05/2024    8:55 AM 03/14/2024    8:26 AM 03/05/2024    2:20 PM  CMP   Glucose 70 - 99 mg/dL 864  887  849   BUN 8 - 23 mg/dL 13  10  12    Creatinine 0.61 - 1.24 mg/dL 9.33  9.33  9.29   Sodium 135 - 145 mmol/L 138  138  139   Potassium 3.5 - 5.1 mmol/L 3.7  3.6  3.7   Chloride 98 - 111 mmol/L 104  103  104   CO2 22 - 32 mmol/L 22  22  25    Calcium 8.9 - 10.3 mg/dL 8.4  8.9  8.3   Total Protein 6.5 - 8.1 g/dL 6.4  6.7  6.3   Total Bilirubin 0.0 - 1.2 mg/dL 0.4  0.4  0.3   Alkaline Phos 38 - 126 U/L 695  496  503   AST 15 - 41 U/L 88  55  80   ALT 0 - 44 U/L 10  9  10     Component Ref Range & Units (hover) 4 wk ago (11/23/23) 2 mo ago (10/14/23) 3 mo ago (09/14/23) 3 mo ago (08/24/23) 4 mo ago (07/27/23) 5 mo ago (07/06/23) 6 mo ago (06/15/23)  Prostatic Specific Antigen >1,500.00 High  >1,500.00 High  CM 1,061.05 High  CM 1,013.69 High  CM 822.82 High  CM 438.43 High  CM 426.49 High  CM   Lab Results  Component Value Date   PSA1 1,362.0 (H) 02/01/2023   PSA1 1,317.0 (H) 01/06/2023   PSA1 334.0 (H) 10/30/2021   Lab Results  Component Value Date   TIBC 353 04/05/2024   TIBC 398 01/29/2022   FERRITIN 958 (H) 04/05/2024   FERRITIN 206 01/29/2022   IRONPCTSAT 20 04/05/2024   IRONPCTSAT 25 01/29/2022   STUDIES:  EXAM: 04/03/2024 ULTRASOUND ABDOMEN LIMITED RIGHT UPPER QUADRANT IMPRESSION: 1. No acute abnormality identified. 2. Mild increased echotexture of the liver. This is a nonspecific finding but can be seen in fatty infiltration of liver.  EXAM: 10/26/2023 NUCLEAR MEDICINE PET SKULL BASE TO THIGH IMPRESSION: 1. Activity within the multiple skeletal lesions is slightly decreased; however, the individual lesions are slightly expanded in radiotracer activity tissue involvement. Overall minimal interval change in widespread metastatic prostate skeletal disease. 2. No new foci of disease. 3. No lymphadenopathy or visceral metastasis.  Exam: 06/01/2023 CT ABDOMEN AND PELVIS WITHOUT CONTRAST IMPRESSIONS: 1. Left-sided hyfrouteronephrosis which  was not previously present. Wuestionable soft tissue lesion in the distal ureter on the left. There may be a mild amount of stranding around the left kidney. There is some mild stranding around the right ureter of uncertain significance as well. Correlate clinically/with laboratory findings. These findings were not previously present. A CT urogram may provide additional information.     HISTORY:   Past Medical History:  Diagnosis Date   Hyperlipidemia    Hypokalemia 03/24/2023   Malignant neoplasm of prostate (HCC)    Supraventricular tachycardia     Past Surgical History:  Procedure Laterality Date   CORONARY STENT INTERVENTION N/A 02/08/2022   Procedure: CORONARY STENT INTERVENTION;  Surgeon: Mady Bruckner, MD;  Location: [facility] INVASIVE CV LAB;  Service: Cardiovascular;  Laterality: N/A;   LEFT HEART CATH AND CORONARY ANGIOGRAPHY N/A 02/08/2022   Procedure: LEFT HEART CATH AND CORONARY ANGIOGRAPHY;  Surgeon: Mady Bruckner, MD;  Location: [facility] INVASIVE CV  LAB;  Service: Cardiovascular;  Laterality: N/A;   PROSTATECTOMY  07/2014   TONSILLECTOMY      Family History  Problem Relation Age of Onset   Breast cancer Mother 77   Ovarian cancer Mother 83   Kidney Stones Sister    Breast cancer Paternal Grandmother 58    Social History:  reports that he has never smoked. He has never used smokeless tobacco. He reports that he does not currently use alcohol. He reports that he does not use drugs.The patient is alone today.  Allergies:  Allergies  Allergen Reactions   Atorvastatin    Rosuvastatin Other (See Comments)    Other reaction(s): Muscle pain   Simvastatin Other (See Comments)    Other reaction(s): Joint pain, Muscle pain   Sulfa Antibiotics    Sulfamethoxazole-Trimethoprim Other (See Comments)    Unknown as to interaction was a baby.    Current Medications: Current Outpatient Medications  Medication Sig Dispense Refill   Alirocumab 75 MG/ML SOAJ Inject 75 mg into the  skin.     Cholecalciferol 25 MCG (1000 UT) tablet Take 1,000 Units by mouth in the morning and at bedtime.     clopidogrel  (PLAVIX ) 75 MG tablet Take 75 mg by mouth daily.     diphenhydrAMINE  (BENADRYL ) 25 mg capsule Take 1 capsule by mouth at bedtime.     diphenhydrAMINE  (BENADRYL ) 50 MG capsule Take 50 mg by mouth at bedtime as needed.     ezetimibe  (ZETIA ) 10 MG tablet Take 10 mg by mouth daily.     guaiFENesin-codeine 100-10 MG/5ML syrup Take 5-10 mLs by mouth every 4 (four) hours as needed.     isosorbide mononitrate (IMDUR) 30 MG 24 hr tablet Take 1 tablet by mouth every 8 (eight) hours as needed.     leuprolide  (LUPRON ) 22.5 MG injection Inject 22.5 mg into the muscle every 3 (three) months.     metoprolol  tartrate (LOPRESSOR ) 25 MG tablet Take 0.5 tablets (12.5 mg total) by mouth 2 (two) times daily. 120 tablet 1   morphine  (MS CONTIN ) 30 MG 12 hr tablet Take 1 tablet (30 mg total) by mouth in the morning, at noon, and at bedtime. 90 tablet 0   Multiple Vitamins-Minerals (MULTIVITAMIN WITH MINERALS) tablet Take 1 tablet by mouth daily.     nitroGLYCERIN  (NITROSTAT ) 0.4 MG SL tablet Place 0.4 mg under the tongue every 5 (five) minutes as needed for chest pain. Repeat every 5 minutes if needed for a total of 3 tablets in 15 minutes. If no relief, CALL 911.     Omega-3 Fatty Acids (FISH OIL) 1000 MG CAPS Take 2,000 mg by mouth in the morning and at bedtime.     ondansetron  (ZOFRAN ) 8 MG tablet Take 1 tablet (8 mg total) by mouth every 8 (eight) hours as needed for nausea or vomiting. 20 tablet 2   oxyCODONE  (OXY IR/ROXICODONE ) 5 MG immediate release tablet Take 1 tablet (5 mg total) by mouth every 6 (six) hours as needed for severe pain (pain score 7-10). 30 tablet 0   polyethylene glycol powder (GLYCOLAX/MIRALAX) 17 GM/SCOOP powder Take 1 Container by mouth once.     potassium chloride  (KLOR-CON  M) 10 MEQ tablet TAKE 1 TABLET(10 MEQ) BY MOUTH TWICE DAILY 180 tablet 3   prochlorperazine   (COMPAZINE ) 10 MG tablet Take 1 tablet (10 mg total) by mouth every 6 (six) hours as needed for nausea or vomiting. 30 tablet 2   Royal Jelly-Bee Pollen-Ginseng (KOREAN GINSENG COMPLEX) 150-250-50 MG CAPS  Take 1 capsule by mouth daily.     triamcinolone  (KENALOG ) 0.025 % cream Apply 1 Application topically 2 (two) times daily. 30 g 5   Zoledronic  Acid (ZOMETA ) 4 MG/100ML IVPB Inject 4 mg into the vein every 3 (three) months.     No current facility-administered medications for this visit.   Facility-Administered Medications Ordered in Other Visits  Medication Dose Route Frequency Provider Last Rate Last Admin   sodium chloride  flush (NS) 0.9 % injection 10 mL  10 mL Intravenous PRN Cornelius Marvin DEL, MD   10 mL at 03/17/23 0953   sodium chloride  flush (NS) 0.9 % injection 10 mL  10 mL Intracatheter PRN Cornelius Marvin DEL, MD   10 mL at 06/15/23 1234   sodium chloride  flush (NS) 0.9 % injection 10 mL  10 mL Intravenous PRN Cornelius Marvin DEL, MD   10 mL at 07/06/23 9071   sodium chloride  flush (NS) 0.9 % injection 10 mL  10 mL Intravenous PRN Cornelius Marvin DEL, MD   10 mL at 07/27/23 1114   sodium chloride  flush (NS) 0.9 % injection 10 mL  10 mL Intracatheter PRN Cornelius Marvin DEL, MD   10 mL at 09/14/23 9046   I,Francia Verry H Maddalyn Lutze,acting as a scribe for Marvin DEL Cornelius, MD.,have documented all relevant documentation on the behalf of Marvin DEL Cornelius, MD,as directed by  Marvin DEL Cornelius, MD while in the presence of Marvin DEL Cornelius, MD.  I have reviewed this report as typed by the medical scribe, and it is complete and accurate.  "

## 2024-04-05 NOTE — Telephone Encounter (Signed)
 Patient has been scheduled for follow-up visit per 04/04/2024 LOS.  Pt given an appt calendar with date and time.

## 2024-04-10 ENCOUNTER — Encounter: Payer: Self-pay | Admitting: Hematology and Oncology

## 2024-04-10 ENCOUNTER — Inpatient Hospital Stay

## 2024-04-10 ENCOUNTER — Ambulatory Visit (INDEPENDENT_AMBULATORY_CARE_PROVIDER_SITE_OTHER)
Admission: RE | Admit: 2024-04-10 | Discharge: 2024-04-10 | Disposition: A | Discharge status: Home / Self Care | Source: Ambulatory Visit | Attending: Hematology and Oncology | Admitting: Hematology and Oncology

## 2024-04-10 ENCOUNTER — Other Ambulatory Visit: Payer: Self-pay | Admitting: Hematology and Oncology

## 2024-04-10 ENCOUNTER — Encounter: Payer: Self-pay | Admitting: Oncology

## 2024-04-10 ENCOUNTER — Telehealth: Payer: Self-pay

## 2024-04-10 ENCOUNTER — Inpatient Hospital Stay: Admitting: Hematology and Oncology

## 2024-04-10 VITALS — BP 138/77 | HR 101 | Resp 18 | Ht 67.0 in

## 2024-04-10 DIAGNOSIS — E860 Dehydration: Secondary | ICD-10-CM

## 2024-04-10 DIAGNOSIS — C61 Malignant neoplasm of prostate: Secondary | ICD-10-CM

## 2024-04-10 DIAGNOSIS — R7401 Elevation of levels of liver transaminase levels: Secondary | ICD-10-CM

## 2024-04-10 DIAGNOSIS — C7951 Secondary malignant neoplasm of bone: Secondary | ICD-10-CM

## 2024-04-10 DIAGNOSIS — R112 Nausea with vomiting, unspecified: Secondary | ICD-10-CM | POA: Insufficient documentation

## 2024-04-10 DIAGNOSIS — M25552 Pain in left hip: Secondary | ICD-10-CM

## 2024-04-10 DIAGNOSIS — G893 Neoplasm related pain (acute) (chronic): Secondary | ICD-10-CM | POA: Diagnosis not present

## 2024-04-10 DIAGNOSIS — D649 Anemia, unspecified: Secondary | ICD-10-CM

## 2024-04-10 DIAGNOSIS — R110 Nausea: Secondary | ICD-10-CM | POA: Insufficient documentation

## 2024-04-10 MED ORDER — OXYCODONE HCL 5 MG PO TABS
5.0000 mg | ORAL_TABLET | Freq: Once | ORAL | Status: AC
  Administered 2024-04-10: 5 mg via ORAL
  Filled 2024-04-10: qty 1

## 2024-04-10 MED ORDER — OXYCODONE HCL 5 MG PO TABS: 5.0000 mg | ORAL_TABLET | Freq: Four times a day (QID) | ORAL | 0 refills | Status: AC | PRN

## 2024-04-10 MED ORDER — ONDANSETRON HCL 8 MG PO TABS: 8.0000 mg | ORAL_TABLET | Freq: Three times a day (TID) | ORAL | 2 refills | Status: DC | PRN

## 2024-04-10 MED ORDER — SODIUM CHLORIDE 0.9 % IV SOLN: Freq: Once | INTRAVENOUS | Status: AC

## 2024-04-10 MED ORDER — ONDANSETRON HCL 4 MG/2ML IJ SOLN
8.0000 mg | Freq: Once | INTRAMUSCULAR | Status: AC
  Administered 2024-04-10: 8 mg via INTRAVENOUS
  Filled 2024-04-10: qty 4

## 2024-04-10 NOTE — Telephone Encounter (Signed)
 Talked with Eleanor Bach, NP. Patient come now for IVF per Bartley, RN in infusion. Called [facility] and informed him to bring patient on.

## 2024-04-10 NOTE — Assessment & Plan Note (Signed)
 Stage IVA prostate cancer with bone metastasis diagnosed in January 2016.  He has been through multiple lines of chemotherapy and complete androgen blockade and has had a significant increase in his PSA, now measured as greater than 1500, so not helpful in identifying further progression.  PSMA PET in August was fairly stable.  He was placed on a trial of ketoconazole  in September and continued leuprolide  and zoledronic  acid every 3 months.  He received leuprolide  and zoledronic  acid in December.  He is having increased left hip pain in the area of known metastasis.  Left hip x-rays pending from today.  We may consider Pluvicto due to the increased pai.  As his pain is limited to the left hip, so palliative radiation may be sufficient.  I had him hold his ketoconazole  due to the elevation AST last month, which remains elevated.

## 2024-04-10 NOTE — Telephone Encounter (Signed)
 [facility] has called stating that patient has starting vomiting and dry heaving again for the last 3 days. Would like to know if we can get patient in for IVF today.

## 2024-04-10 NOTE — Assessment & Plan Note (Signed)
 Worsening anemia.  Iron studies, B12 and folate done last week did not reveal any evidence of nutritional deficiency.  He denies any overt form of blood loss.  He remains on clopidogrel .  He has a history of acute GI bleed last year.  Given this and his persistent nausea he will likely need to see GI if the CT abdomen does not reveal an explanation.

## 2024-04-10 NOTE — Assessment & Plan Note (Signed)
 Fairly chronic nausea of uncertain etiology.  Gallbladder ultrasound was negative.  As this persists, I will obtain a CT abdomen and pelvis as soon as possible for further evaluation.  I will give him Zofran  8 mg tablets.  I encouraged him to try to eat when he is not feeling nauseated.  I will bring him back on Friday for repeat labs and possible further IV fluids if he has not improved.

## 2024-04-10 NOTE — Assessment & Plan Note (Signed)
 He continues zoledronic  acid and leuprolide  every 3 months.  I placed his ketoconazole  on hold in December due to elevated AST.  His last dose of zoledronic  acid was in December.

## 2024-04-10 NOTE — Progress Notes (Signed)
 " Marvin Johnson  [address] [place],  [place]  [address] [phone]  Clinic Day:  04/10/2024  Referring physician: Paniagua, Tiziana, MD  ASSESSMENT & PLAN:   Assessment & Plan: Nausea Fairly chronic nausea of uncertain etiology.  Gallbladder ultrasound was negative.  As this persists, I will obtain a CT abdomen and pelvis as soon as possible for further evaluation.  I will give him Zofran  8 mg tablets.  I encouraged him to try to eat when he is not feeling nauseated.  I will bring him back on Friday for repeat labs and possible further IV fluids if he has not improved.  Left hip pain Pain in his left hip/buttock despite MS Contin  30 mg every 8 hours.  I will give him oxycodone  5 mg every 6 hours as needed for breakthrough.  I will obtain left hip x-ray today.  If no acute abnormality, I will schedule him to see Dr. Jomarie for consideration of palliative radiation to the left hip.  Malignant neoplasm of prostate (HCC) Stage IVA prostate cancer with bone metastasis diagnosed in January 2016.  He has been through multiple lines of chemotherapy and complete androgen blockade and has had a significant increase in his PSA, now measured as greater than 1500, so not helpful in identifying further progression.  PSMA PET in August was fairly stable.  He was placed on a trial of ketoconazole  in September and continued leuprolide  and zoledronic  acid every 3 months.  He received leuprolide  and zoledronic  acid in December.  He is having increased left hip pain in the area of known metastasis.  Left hip x-rays pending from today.  We may consider Pluvicto due to the increased pai.  As his pain is limited to the left hip, so palliative radiation may be sufficient.  I had him hold his ketoconazole  due to the elevation AST last month, which remains elevated.  Secondary malignant neoplasm of bone and bone marrow (HCC) He continues zoledronic  acid and leuprolide  every 3 months.  I placed his  ketoconazole  on hold in December due to elevated AST.  His last dose of zoledronic  acid was in December.  Anemia Worsening anemia.  Iron studies, B12 and folate done last week did not reveal any evidence of nutritional deficiency.  He denies any overt form of blood loss.  He remains on clopidogrel .  He has a history of acute GI bleed last year.  Given this and his persistent nausea he will likely need to see GI if the CT abdomen does not reveal an explanation.  Elevated SGOT (AST) Acute elevation of AST to 280 on December 19 felt to be due to acute illness.  This was previously mildly elevated in the 40s then up to 88 in the ED on December 18.  Acute hepatitis panel and CMV IgM were negative.  His AST improved, but did not normalized despite holding bee pollen and ketoconazole  and was slightly higher on January 22.  Ultrasound of the liver and gallbladder were normal.  I will obtain a CT abdomen/pelvis for further evaluation.  I will see him Friday with a CBC and comprehensive metabolic panel for repeat clinical assessment.    The patient understands the plans discussed today and is in agreement with them.  He knows to contact our office if he develops concerns prior to his next appointment.   I provided 30 minutes of face-to-face time during this encounter and > 50% was spent counseling as documented under my assessment and  plan.    [name], PA-C  [facility] [place] - A DEPT OF [place]. [facility] [address] [place] [address] Dept: [phone] Dept Fax: [phone]   Orders Placed This Encounter  Procedures   CT ABDOMEN PELVIS W CONTRAST    Standing Status:   Future    Expected Date:   04/12/2024    Expiration Date:   04/10/2025    If indicated for the ordered procedure, I authorize the administration of contrast media per Radiology protocol:   Yes    Does the patient have a contrast media/X-ray dye allergy?:   No    Preferred  imaging location?:   [facility]    If indicated for the ordered procedure, I authorize the administration of oral contrast media per Radiology protocol:   Yes   CBC with Differential (Cancer Center Only)    Standing Status:   Future    Expected Date:   04/13/2024    Expiration Date:   07/12/2024   CMP (Cancer Center only)    Standing Status:   Future    Expected Date:   04/13/2024    Expiration Date:   07/12/2024      CHIEF COMPLAINT:  CC: Nausea/dehydration  Current Treatment:  Leuprolide /zoledronic  acid every 3 months  HISTORY OF PRESENT ILLNESS:   Oncology History  Malignant neoplasm of prostate (HCC)  03/30/2014 Cancer Staging   Staging form: Prostate, AJCC 8th Edition - Clinical stage from 03/30/2014: Stage IVA (cT3b, cN1, cM0, PSA: 51.8, Grade Group: 4) - Signed by Cornelius Wanda DEL, MD on 11/14/2020 Histopathologic type: Adenocarcinoma, NOS Stage prefix: Initial diagnosis Prostate specific antigen (PSA) range: 20 or greater Gleason primary pattern: 4 Gleason secondary pattern: 4 Gleason score: 8 Histologic grading system: 5 grade system Location of positive needle core biopsies: Beyond primary site Prognostic indicators: May 2016 Robotic prostatectomy with mult. Pos margins, extra prostatic extension, 2/6 pos nodes. Scan June 2016 positive at T10  PSA up to 216.3 by August Placed on lupron  and casodex Stage used in treatment planning: Yes National guidelines used in treatment planning: Yes Type of national guideline used in treatment planning: NCCN   02/18/2020 Initial Diagnosis   Malignant neoplasm of prostate (HCC)   12/01/2021 - 07/19/2022 Chemotherapy   Patient is on Treatment Plan : PROSTATE Docetaxel  (75) + Prednisone  q21d     12/21/2022 - 08/26/2023 Chemotherapy   Patient is on Treatment Plan : PROSTATE Cabazitaxel  (20) D1 + Prednisone  D1-21 q21d     Secondary malignant neoplasm of bone and bone marrow (HCC)  02/18/2020 Initial Diagnosis   Secondary  malignant neoplasm of bone and bone marrow (HCC)   12/01/2021 - 07/19/2022 Chemotherapy   Patient is on Treatment Plan : PROSTATE Docetaxel  (75) + Prednisone  q21d     12/21/2022 - 08/26/2023 Chemotherapy   Patient is on Treatment Plan : PROSTATE Cabazitaxel  (20) D1 + Prednisone  D1-21 q21d     Malignant neoplasm of prostate metastatic to lung (HCC)  11/25/2021 Initial Diagnosis   Malignant neoplasm of prostate metastatic to lung (HCC)   12/21/2022 - 08/26/2023 Chemotherapy   Patient is on Treatment Plan : PROSTATE Cabazitaxel  (20) D1 + Prednisone  D1-21 q21d         INTERVAL HISTORY:   Marvin Johnson is added to the infusion scheduled for IV antiemetics and IV fluids as his friend called to tell us  he is having nausea and not eating or drinking well.  Labs were ordered,  but not done.  Labs 5 days ago were fairly stable.  He had worsening anemia and persistent elevation of the AST, slightly higher than previous.   He had an acute gastroenteritis in December with significant elevation of the AST, this improved, but still had not normalized. The  alkaline phosphatase continues to rise, associated with his bone metastasis.  Iron studies, B12 and folate did not reveal nutritional deficiency contributing to his anemia.    He is added to my schedule near the end of infusion due to increased pain in the left hip despite MS Contin  30 mg every 8 hours.  He states he does not have any medication for breakthrough pain.  He has known bone metastasis in this area.  He reports episodes of nausea usually resolve relieved with prochlorperazine .  He has ondansetron  ODT, which makes him gag, so he is not using that.  He states his bowel movements are soft and regular.  He denies constipation.  He denies fevers or chills. His appetite is poor.  He reports sleeping in his recliner most of the day, in addition to sleeping at night.  REVIEW OF SYSTEMS:   Review of Systems  Constitutional:  Positive for appetite change and fatigue.  Negative for chills, fever and unexpected weight change.  HENT:   Negative for lump/mass, mouth sores and sore throat.   Respiratory:  Negative for cough and shortness of breath.   Cardiovascular:  Negative for chest pain and leg swelling.  Gastrointestinal:  Positive for nausea. Negative for abdominal pain, constipation, diarrhea and vomiting.  Genitourinary:  Negative for difficulty urinating, dysuria, frequency and hematuria.   Musculoskeletal:  Positive for arthralgias (left hip) and back pain. Negative for myalgias.  Skin:  Negative for rash.  Neurological:  Negative for dizziness, extremity weakness, headaches and numbness.  Hematological:  Negative for adenopathy.  Psychiatric/Behavioral:  Negative for depression and sleep disturbance. The patient is not nervous/anxious.      VITALS:    04/10/2024  BASIC VITALS   BP 138/77   BP 127/71   Site Left Arm   Site Right Arm   Patient Position Sitting   Patient Position Sitting   Pulse Rate 101 !   Pulse Rate 102 !   Resp 18   Resp 18   Temp   Height 5' 7   Height 5' 7   Weight   BMI (Calculated)   ORTHOSTATICS   Patient Position Sitting   Patient Position Sitting   OXIMETRY   SpO2 100 %   SpO2 99 %     Legend: ! Abnormal  Wt Readings from Last 3 Encounters:  04/05/24 167 lb 14.4 oz (76.2 kg)  03/14/24 169 lb 12.8 oz (77 kg)  03/05/24 171 lb 14.4 oz (78 kg)    There is no height or weight on file to calculate BMI.  Performance status (ECOG): 2 - Symptomatic, <50% confined to bed    PHYSICAL EXAM:   Physical Exam Vitals and nursing note reviewed.  Constitutional:      General: He is not in acute distress.    Appearance: He is normal weight. He is ill-appearing (mildly weak appearing). He is not toxic-appearing or diaphoretic.  HENT:     Head: Normocephalic and atraumatic.     Mouth/Throat:     Mouth: Mucous membranes are dry.     Pharynx: Oropharynx is clear. No oropharyngeal exudate or posterior  oropharyngeal erythema.  Eyes:     General: No scleral icterus.  Extraocular Movements: Extraocular movements intact.     Conjunctiva/sclera: Conjunctivae normal.     Pupils: Pupils are equal, round, and reactive to light.  Cardiovascular:     Rate and Rhythm: Normal rate and regular rhythm.     Heart sounds: Normal heart sounds. No murmur heard.    No friction rub. No gallop.  Pulmonary:     Effort: Pulmonary effort is normal.     Breath sounds: Normal breath sounds. No wheezing, rhonchi or rales.  Abdominal:     General: Bowel sounds are normal. There is no distension.     Palpations: Abdomen is soft. There is no mass.     Tenderness: There is no abdominal tenderness.  Musculoskeletal:        General: Normal range of motion.     Cervical back: Normal range of motion and neck supple. No tenderness.     Right lower leg: No edema.     Left lower leg: No edema.  Lymphadenopathy:     Cervical: No cervical adenopathy.  Skin:    General: Skin is warm and dry.     Coloration: Skin is not jaundiced.     Findings: No rash.  Neurological:     Mental Status: He is alert and oriented to person, place, and time.     Cranial Nerves: No cranial nerve deficit.  Psychiatric:        Mood and Affect: Mood normal.        Behavior: Behavior normal.        Thought Content: Thought content normal.     LABS:      Latest Ref Rng & Units 04/05/2024    8:55 AM 03/14/2024    8:26 AM 03/05/2024    2:20 PM  CBC  WBC 4.0 - 10.5 K/uL 7.4  11.5  6.9   Hemoglobin 13.0 - 17.0 g/dL 89.8  88.9  89.2   Hematocrit 39.0 - 52.0 % 32.7  35.4  35.1   Platelets 150 - 400 K/uL 177  231  223       Latest Ref Rng & Units 04/05/2024    8:55 AM 03/14/2024    8:26 AM 03/05/2024    2:20 PM  CMP  Glucose 70 - 99 mg/dL 864  887  849   BUN 8 - 23 mg/dL 13  10  12    Creatinine 0.61 - 1.24 mg/dL 9.33  9.33  9.29   Sodium 135 - 145 mmol/L 138  138  139   Potassium 3.5 - 5.1 mmol/L 3.7  3.6  3.7   Chloride 98 -  111 mmol/L 104  103  104   CO2 22 - 32 mmol/L 22  22  25    Calcium 8.9 - 10.3 mg/dL 8.4  8.9  8.3   Total Protein 6.5 - 8.1 g/dL 6.4  6.7  6.3   Total Bilirubin 0.0 - 1.2 mg/dL 0.4  0.4  0.3   Alkaline Phos 38 - 126 U/L 695  496  503   AST 15 - 41 U/L 88  55  80   ALT 0 - 44 U/L 10  9  10      Lab Results  Component Value Date   TIBC 353 04/05/2024   TIBC 398 01/29/2022   FERRITIN 958 (H) 04/05/2024   FERRITIN 206 01/29/2022   IRONPCTSAT 20 04/05/2024   IRONPCTSAT 25 01/29/2022   No results found for: LDH  STUDIES:   DG HIP UNILAT WITH PELVIS 2-3 VIEWS  LEFT Result Date: 04/10/2024 CLINICAL DATA:  pain in left hip, known bone mets from prostate cancer EXAM: DG HIP (WITH OR WITHOUT PELVIS) 2-3V LEFT COMPARISON:  CT AP, 02/08/2024. FINDINGS: There is no evidence of hip fracture or dislocation. Patchy osseous mineralization, greatest within sacrum and iliac crests. No evidence of hip arthropathy. IMPRESSION: 1. No acute displaced pelvis or LEFT hip fracture. 2. Diffuse pelvic osseous metastases. Electronically Signed   By: Thom Hall M.D.   On: 04/10/2024 13:14   US  Abdomen Limited RUQ (LIVER/GB) Result Date: 04/03/2024 CLINICAL DATA:  Chronic nausea. EXAM: ULTRASOUND ABDOMEN LIMITED RIGHT UPPER QUADRANT COMPARISON:  CT abdomen pelvis February 08, 2024 FINDINGS: Gallbladder: No gallstones or wall thickening visualized. No sonographic Murphy sign noted by sonographer. Common bile duct: Diameter: 2 mm. Liver: No focal lesion identified. Mild increased echotexture. Portal vein is patent on color Doppler imaging with normal direction of blood flow towards the liver. Other: None. IMPRESSION: 1. No acute abnormality identified. 2. Mild increased echotexture of the liver. This is a nonspecific finding but can be seen in fatty infiltration of liver. Electronically Signed   By: Craig Farr M.D.   On: 04/03/2024 09:37      HISTORY:   Past Medical History:  Diagnosis Date   Hyperlipidemia     Hypokalemia 03/24/2023   Malignant neoplasm of prostate (HCC)    Supraventricular tachycardia     Past Surgical History:  Procedure Laterality Date   CORONARY STENT INTERVENTION N/A 02/08/2022   Procedure: CORONARY STENT INTERVENTION;  Surgeon: Mady Bruckner, MD;  Location: [facility]ASIVE [facility];  Service: Cardiovascular;  Laterality: N/A;   LEFT HEART CATH AND CORONARY ANGIOGRAPHY N/A 02/08/2022   Procedure: LEFT HEART CATH AND CORONARY ANGIOGRAPHY;  Surgeon: Mady Bruckner, MD;  Location: [facility]VAS[facility] CV [facility];  Service: Cardiovascular;  Laterality: N/A;   PROSTATECTOMY  07/2014   TONSILLECTOMY      Family History  Problem Relation Age of Onset   Breast cancer Mother 63   Ovarian cancer Mother 28   Kidney Stones Sister    Breast cancer Paternal Grandmother 33    Social History:  reports that he has never smoked. He has never used smokeless tobacco. He reports that he does not currently use alcohol. He reports that he does not use drugs.The patient is accompanied by his friend, Marvin Johnson, today.  Allergies: Allergies[1]  Current Medications: Current Outpatient Medications  Medication Sig Dispense Refill   oxyCODONE  (OXY IR/ROXICODONE ) 5 MG immediate release tablet Take 1 tablet (5 mg total) by mouth every 6 (six) hours as needed for severe pain (pain score 7-10). 30 tablet 0   Alirocumab 75 MG/ML SOAJ Inject 75 mg into the skin.     Cholecalciferol 25 MCG (1000 UT) tablet Take 1,000 Units by mouth in the morning and at bedtime.     clopidogrel  (PLAVIX ) 75 MG tablet Take 75 mg by mouth daily.     diphenhydrAMINE  (BENADRYL ) 25 mg capsule Take 1 capsule by mouth at bedtime.     diphenhydrAMINE  (BENADRYL ) 50 MG capsule Take 50 mg by mouth at bedtime as needed.     ezetimibe  (ZETIA ) 10 MG tablet Take 10 mg by mouth daily.     guaiFENesin-codeine 100-10 MG/5ML syrup Take 5-10 mLs by mouth every 4 (four) hours as needed.     isosorbide mononitrate (IMDUR) 30 MG 24 hr tablet Take 1 tablet  by mouth every 8 (eight) hours as needed.     leuprolide  (LUPRON ) 22.5 MG injection  Inject 22.5 mg into the muscle every 3 (three) months.     metoprolol  tartrate (LOPRESSOR ) 25 MG tablet Take 0.5 tablets (12.5 mg total) by mouth 2 (two) times daily. 120 tablet 1   morphine  (MS CONTIN ) 30 MG 12 hr tablet Take 1 tablet (30 mg total) by mouth in the morning, at noon, and at bedtime. 90 tablet 0   Multiple Vitamins-Minerals (MULTIVITAMIN WITH MINERALS) tablet Take 1 tablet by mouth daily.     nitroGLYCERIN  (NITROSTAT ) 0.4 MG SL tablet Place 0.4 mg under the tongue every 5 (five) minutes as needed for chest pain. Repeat every 5 minutes if needed for a total of 3 tablets in 15 minutes. If no relief, CALL 911.     Omega-3 Fatty Acids (FISH OIL) 1000 MG CAPS Take 2,000 mg by mouth in the morning and at bedtime.     ondansetron  (ZOFRAN ) 8 MG tablet Take 1 tablet (8 mg total) by mouth every 8 (eight) hours as needed for nausea or vomiting. 20 tablet 2   polyethylene glycol powder (GLYCOLAX/MIRALAX) 17 GM/SCOOP powder Take 1 Container by mouth once.     potassium chloride  (KLOR-CON  M) 10 MEQ tablet TAKE 1 TABLET(10 MEQ) BY MOUTH TWICE DAILY 180 tablet 3   prochlorperazine  (COMPAZINE ) 10 MG tablet Take 1 tablet (10 mg total) by mouth every 6 (six) hours as needed for nausea or vomiting. 30 tablet 2   Royal Jelly-Bee Pollen-Ginseng (KOREAN GINSENG COMPLEX) 150-250-50 MG CAPS Take 1 capsule by mouth daily.     triamcinolone  (KENALOG ) 0.025 % cream Apply 1 Application topically 2 (two) times daily. 30 g 5   Zoledronic  Acid (ZOMETA ) 4 MG/100ML IVPB Inject 4 mg into the vein every 3 (three) months.     No current facility-administered medications for this visit.   Facility-Administered Medications Ordered in Other Visits  Medication Dose Route Frequency Provider Last Rate Last Admin   sodium chloride  flush (NS) 0.9 % injection 10 mL  10 mL Intravenous PRN Cornelius Wanda DEL, MD   10 mL at 03/17/23 0953    sodium chloride  flush (NS) 0.9 % injection 10 mL  10 mL Intracatheter PRN Cornelius Wanda DEL, MD   10 mL at 06/15/23 1234   sodium chloride  flush (NS) 0.9 % injection 10 mL  10 mL Intravenous PRN Cornelius Wanda DEL, MD   10 mL at 07/06/23 9071   sodium chloride  flush (NS) 0.9 % injection 10 mL  10 mL Intravenous PRN Cornelius Wanda DEL, MD   10 mL at 07/27/23 1114   sodium chloride  flush (NS) 0.9 % injection 10 mL  10 mL Intracatheter PRN Cornelius Wanda DEL, MD   10 mL at 09/14/23 9046             [1]  Allergies Allergen Reactions   Atorvastatin    Rosuvastatin Other (See Comments)    Other reaction(s): Muscle pain   Simvastatin Other (See Comments)    Other reaction(s): Joint pain, Muscle pain   Sulfa Antibiotics    Sulfamethoxazole-Trimethoprim Other (See Comments)    Unknown as to interaction was a baby.   "

## 2024-04-10 NOTE — Patient Instructions (Signed)
 Dehydration, Adult Dehydration is a condition in which there is not enough water or other fluids in the body. This happens when a person loses more fluids than they take in. Important organs cannot work right without the right amount of fluids. Any loss of fluids from the body can cause dehydration. Dehydration can be mild, worse, or very bad. It should be treated right away to keep it from getting very bad. What are the causes? Conditions that cause loss of water in the body. They include: Watery poop (diarrhea). Vomiting. Sweating a lot. Fever. Infection. Peeing (urinating) a lot. Not drinking enough fluids. Certain medicines, such as medicines that take extra fluid out of the body (diuretics). Lack of safe drinking water. Not being able to get enough water and food. What increases the risk? Having a long-term (chronic) illness that has not been treated the right way, such as: Diabetes. Heart disease. Kidney disease. Being 25 years of age or older. Having a disability. Living in a place that is high above the ground or sea (high in altitude). The thinner, drier air causes more fluid loss. Doing exercises that put stress on your body for a long time. Being active when in hot places. What are the signs or symptoms? Symptoms of dehydration depend on how bad it is. Mild or worse dehydration Thirst. Dry lips or dry mouth. Feeling dizzy or light-headed. Muscle cramps. Passing little pee or dark pee. Pee may be the color of tea. Headache. Very bad dehydration Changes in skin. Skin may: Be cold to the touch (clammy). Be blotchy or pale. Not go back to normal right after you pinch it and let it go. Little or no tears, pee, or sweat. Fast breathing. Low blood pressure. Weak pulse. Pulse that is more than 100 beats a minute when you are sitting still. Other changes, such as: Feeling very thirsty. Eyes that look hollow (sunken). Cold hands and feet. Being confused. Being very  tired (lethargic) or having trouble waking from sleep. Losing weight. Loss of consciousness. How is this treated? Treatment for this condition depends on how bad your dehydration is. Treatment should start right away. Do not wait until your condition gets very bad. Very bad dehydration is an emergency. You will need to go to a hospital. Mild or worse dehydration can be treated at home. You may be asked to: Drink more fluids. Drink an oral rehydration solution (ORS). This drink gives you the right amount of fluids, salts, and minerals (electrolytes). Very bad dehydration can be treated: With fluids through an IV tube. By correcting low levels of electrolytes in the body. By treating the problem that caused your dehydration. Follow these instructions at home: Oral rehydration solution If told by your doctor, drink an ORS: Make an ORS. Use instructions on the package. Start by drinking small amounts, about  cup (120 mL) every 5-10 minutes. Slowly drink more until you have had the amount that your doctor said to have.  Eating and drinking  Drink enough clear fluid to keep your pee pale yellow. If you were told to drink an ORS, finish the ORS first. Then, start slowly drinking other clear fluids. Drink fluids such as: Water. Do not drink only water. Doing that can make the salt (sodium) level in your body get too low. Water from ice chips you suck on. Fruit juice that you have added water to (diluted). Low-calorie sports drinks. Eat foods that have the right amounts of salts and minerals, such as bananas, oranges, potatoes,  tomatoes, or spinach. Do not drink alcohol. Avoid drinks that have caffeine or sugar. These include:: High-calorie sports drinks. Fruit juice that you did not add water to. Soda. Coffee or energy drinks. Avoid foods that are greasy or have a lot of fat or sugar. General instructions Take over-the-counter and prescription medicines only as told by your doctor. Do  not take sodium tablets. Doing that can make the salt level in your body get too high. Return to your normal activities as told by your doctor. Ask your doctor what activities are safe for you. Keep all follow-up visits. Your doctor may check and change your treatment. Contact a doctor if: You have pain in your belly (abdomen) and the pain: Gets worse. Stays in one place. You have a rash. You have a stiff neck. You get angry or annoyed more easily than normal. You are more tired or have a harder time waking than normal. You feel weak or dizzy. You feel very thirsty. Get help right away if: You have any symptoms of very bad dehydration. You vomit every time you eat or drink. Your vomiting gets worse, does not go away, or you vomit blood or green stuff. You are getting treatment, but symptoms are getting worse. You have a fever. You have a very bad headache. You have: Diarrhea that gets worse or does not go away. Blood in your poop (stool). This may cause poop to look black and tarry. No pee in 6-8 hours. Only a small amount of pee in 6-8 hours, and the pee is very dark. You have trouble breathing. These symptoms may be an emergency. Get help right away. Call 911. Do not wait to see if the symptoms will go away. Do not drive yourself to the hospital. This information is not intended to replace advice given to you by your health care provider. Make sure you discuss any questions you have with your health care provider. Document Revised: 09/28/2021 Document Reviewed: 09/28/2021 Elsevier Patient Education  2024 [employer].

## 2024-04-10 NOTE — Assessment & Plan Note (Signed)
 Pain in his left hip/buttock despite MS Contin  30 mg every 8 hours.  I will give him oxycodone  5 mg every 6 hours as needed for breakthrough.  I will obtain left hip x-ray today.  If no acute abnormality, I will schedule him to see Dr. Jomarie for consideration of palliative radiation to the left hip.

## 2024-04-10 NOTE — Assessment & Plan Note (Signed)
 Acute elevation of AST to 280 on December 19 felt to be due to acute illness.  This was previously mildly elevated in the 40s then up to 88 in the ED on December 18.  Acute hepatitis panel and CMV IgM were negative.  His AST improved, but did not normalized despite holding bee pollen and ketoconazole  and was slightly higher on January 22.  Ultrasound of the liver and gallbladder were normal.  I will obtain a CT abdomen/pelvis for further evaluation.  I will see him Friday with a CBC and comprehensive metabolic panel for repeat clinical assessment.

## 2024-04-12 ENCOUNTER — Ambulatory Visit (HOSPITAL_BASED_OUTPATIENT_CLINIC_OR_DEPARTMENT_OTHER)
Admission: RE | Admit: 2024-04-12 | Discharge: 2024-04-12 | Disposition: A | Discharge status: Home / Self Care | Source: Ambulatory Visit | Attending: Hematology and Oncology | Admitting: Hematology and Oncology

## 2024-04-12 ENCOUNTER — Encounter: Payer: Self-pay | Admitting: Oncology

## 2024-04-12 DIAGNOSIS — R110 Nausea: Secondary | ICD-10-CM

## 2024-04-12 DIAGNOSIS — C61 Malignant neoplasm of prostate: Secondary | ICD-10-CM

## 2024-04-12 DIAGNOSIS — C7951 Secondary malignant neoplasm of bone: Secondary | ICD-10-CM

## 2024-04-12 MED ORDER — HEPARIN SOD (PORK) LOCK FLUSH 100 UNIT/ML IV SOLN
500.0000 [IU] | Freq: Once | INTRAVENOUS | Status: AC
  Administered 2024-04-12: 500 [IU] via INTRAVENOUS

## 2024-04-12 MED ORDER — IOHEXOL 300 MG/ML  SOLN
100.0000 mL | Freq: Once | INTRAMUSCULAR | Status: AC | PRN
  Administered 2024-04-12: 100 mL via INTRAVENOUS

## 2024-04-12 NOTE — Progress Notes (Signed)
 " [facility]  [address] [place],  [place]  [phone]  Clinic Day:  04/13/2024  Referring physician: Paniagua, Tiziana, MD  ASSESSMENT & PLAN:   Assessment & Plan: Nausea Fairly chronic nausea of uncertain etiology.  I discontinued ketoconazole  and bee pollen last month.  He was instructed to take his narcotic pain medicine with food.  Gallbladder ultrasound was negative.  CT abdomen and pelvis did not reveal any explanation for the nausea. The patient feels it is his gallbladder. We will refer him to GI asap for further evaluation.  He is drinking more fluids, but remains clinically dehydrated, so will receive IV fluids today.  I recommended he try Claritin or Zyrtec for the upper respiratory congestion.  He is still eating very little due to decreased appetite. We could try mirtazapine for appetite but as he is already sleeping most of the day, I will hold off. I am concerned other medications to stimulate appetite will have worse adverse effects.  I encouraged him to eat several small meals a day and gave him samples of Carnation instant breakfast to try, as he had difficulty with other dietary supplements.  I explained that that his fatigue is partially due to lack of nutrition.  I will have our dietician contact him. I will see him back next week with a CBC and comprehensive metabolic panel for continued supportive care. I will see him back next week with a CBC and comprehensive metabolic panel for continued supportive care.  Elevated SGOT (AST) This was initially felt to be due to acute gastroenteritis last month and had been improving but then last week started to increase again.  It is higher again today.  There is no obvious liver abnormality on CT imaging.  The plan is for the patient to see GI as soon as possible.  We will continue to monitor this.  Elevated alkaline phosphatase level Worsening elevation of the alkaline phosphatase.  This has been  slowly increasing since August and felt to be secondary to his bone metastasis.  It jumped up significantly with the acute gastroenteritis and had been improving, but now was significantly worse.  I stopped his ketoconazole  due to the elevated AST.  Unfortunately, other than radioactive Pluvicto, the patient has been through all treatment lines.  Malignant neoplasm of prostate (HCC) Stage IVA prostate cancer with bone metastasis diagnosed in January 2016.  He has been through multiple lines of chemotherapy and complete androgen blockade and has had a significant increase in his PSA, now measured as greater than 1500, so not helpful in identifying further progression.  PSMA PET in August was fairly stable.  He was placed on a trial of ketoconazole  in September and continued leuprolide  and zoledronic  acid every 3 months.  He received leuprolide  and zoledronic  acid in December.  I had him hold his ketoconazole  due to the elevation AST last month, which remains elevated. He was having increased left hip pain in the area of known metastasis.  Left hip x-rays did not reveal fracture.  Widespread osseous metastasis were seen.  Unfortunately, he has been through all treatment options except for Pluvicto.  I gave him oxycodone  5 mg to use every 6 hours as needed for breakthrough pain.  He really has not needed this and denies pain today.  If he has increasing pain, we will refer him for palliative external beam radiation.  Secondary malignant neoplasm of bone and bone marrow (HCC) He continues zoledronic  acid and leuprolide   every 3 months.  I placed his ketoconazole  on hold in December due to elevated AST.  His last dose of zoledronic  acid was in December.  He had increased left hip pain earlier this week.  X-ray did not reveal fracture.  Diffuse osseous metastasis were seen.  I gave him oxycodone  5 mg every 6 hours to use as needed for breakthrough pain.  He continues MS Contin  30 mg every 8 hours.  Malignant  neoplasm of prostate metastatic to lung [facility]) He has had multiple small pulmonary nodules felt to most likely represent metastatic disease.  Recent CT abdomen and pelvis revealed multiple pulmonary nodules involving the visualized lung bases consistent with metastatic disease.    The patient understands the plans discussed today and is in agreement with them.  He knows to contact our office if he develops concerns prior to his next appointment.   I provided 40 minutes of face-to-face time during this encounter and > 50% was spent counseling as documented under my assessment and plan.    Marvin DELENA Foy, PA-C  [facility] [place] - A DEPT OF [place] [address] [place] [address] Dept: [phone] Dept Fax: [phone]   Orders Placed This Encounter  Procedures   Ambulatory Referral to [facility] Nutrition    Referral Priority:   Routine    Referral Type:   Consultation    Referral Reason:   Specialty Services Required    Number of Visits Requested:   1      CHIEF COMPLAINT:  CC: Nausea  Current Treatment: Supportive care  HISTORY OF PRESENT ILLNESS:   Oncology History  Malignant neoplasm of prostate (HCC)  03/30/2014 Cancer Staging   Staging form: Prostate, AJCC 8th Edition - Clinical stage from 03/30/2014: Stage IVA (cT3b, cN1, cM0, PSA: 51.8, Grade Group: 4) - Signed by Marvin Wanda DEL, MD on 11/14/2020 Histopathologic type: Adenocarcinoma, NOS Stage prefix: Initial diagnosis Prostate specific antigen (PSA) range: 20 or greater Gleason primary pattern: 4 Gleason secondary pattern: 4 Gleason score: 8 Histologic grading system: 5 grade system Location of positive needle core biopsies: Beyond primary site Prognostic indicators: May 2016 Robotic prostatectomy with mult. Pos margins, extra prostatic extension, 2/6 pos nodes. Scan June 2016 positive at T10  PSA up to 216.3 by August Placed on lupron  and casodex Stage  used in treatment planning: Yes National guidelines used in treatment planning: Yes Type of national guideline used in treatment planning: NCCN   02/18/2020 Initial Diagnosis   Malignant neoplasm of prostate (HCC)   12/01/2021 - 07/19/2022 Chemotherapy   Patient is on Treatment Plan : PROSTATE Docetaxel  (75) + Prednisone  q21d     12/21/2022 - 08/26/2023 Chemotherapy   Patient is on Treatment Plan : PROSTATE Cabazitaxel  (20) D1 + Prednisone  D1-21 q21d     Secondary malignant neoplasm of bone and bone marrow (HCC)  02/18/2020 Initial Diagnosis   Secondary malignant neoplasm of bone and bone marrow (HCC)   12/01/2021 - 07/19/2022 Chemotherapy   Patient is on Treatment Plan : PROSTATE Docetaxel  (75) + Prednisone  q21d     12/21/2022 - 08/26/2023 Chemotherapy   Patient is on Treatment Plan : PROSTATE Cabazitaxel  (20) D1 + Prednisone  D1-21 q21d     Malignant neoplasm of prostate metastatic to lung (HCC)  11/25/2021 Initial Diagnosis   Malignant neoplasm of prostate metastatic to lung (HCC)   12/21/2022 - 08/26/2023 Chemotherapy   Patient is on Treatment Plan : PROSTATE Cabazitaxel  (20) D1 +  Prednisone  D1-21 q21d         INTERVAL HISTORY:  Marvin Johnson is here today for repeat clinical assessment.  He was seen earlier this week due to recurrent nausea and vomiting.  It was initially thought this may be due to gallbladder abnormalities but right upper quadrant ultrasound did not reveal any abnormality.  Therefore, he was scheduled for CT abdomen and pelvis for further evaluation.  This did not reveal any explanation for his nausea and vomiting.  He was having increased left hip pain.  X-ray did not reveal fracture.  He has been on MS Contin  30 mg every 8 hours.  I gave him oxycodone  5 mg every 6 hours as needed for breakthrough pain.  I advised him to be sure to take his pain medications with food.  He states he has been able to drink more and eat a little more, but his appetite remains poor.  His nausea  fluctuates up and down.  He seems to have some upper respiratory congestion that causes him to cough, gag and vomit.  He remains fatigued and sleeps most of the day and night.  He denies fevers or chills. He denies pain. His appetite is poor. His weight was not taken today in infusion.  REVIEW OF SYSTEMS:   Review of Systems  Constitutional:  Positive for fever. Negative for appetite change, chills, fatigue and unexpected weight change.  HENT:   Negative for lump/mass, mouth sores and sore throat.   Respiratory:  Positive for cough. Negative for shortness of breath.   Cardiovascular:  Negative for chest pain and leg swelling.  Gastrointestinal:  Positive for nausea. Negative for abdominal pain, constipation, diarrhea and vomiting.  Genitourinary:  Negative for difficulty urinating, dysuria, frequency and hematuria.   Musculoskeletal:  Positive for gait problem (Ambulates with cane). Negative for arthralgias, back pain and myalgias.  Skin:  Negative for itching and rash.  Neurological:  Positive for gait problem (Ambulates with cane). Negative for dizziness, extremity weakness, headaches, light-headedness and numbness.  Hematological:  Negative for adenopathy.  Psychiatric/Behavioral:  Negative for depression and sleep disturbance. The patient is not nervous/anxious.      VITALS:  Vital signs today include a temperature of 97.9, pulse 87, respirations 18 and blood pressure 123/63.  Pulse oximetry is 100% on room air.  Wt Readings from Last 3 Encounters:  04/05/24 167 lb 14.4 oz (76.2 kg)  03/14/24 169 lb 12.8 oz (77 kg)  03/05/24 171 lb 14.4 oz (78 kg)    There is no height or weight on file to calculate BMI.  Performance status (ECOG): 3 - Symptomatic, >50% confined to bed    PHYSICAL EXAM:   Physical Exam Vitals and nursing note reviewed.  Constitutional:      General: He is not in acute distress.    Appearance: Normal appearance. He is normal weight. He is ill-appearing (weak  appearing older gentleman).  HENT:     Head: Normocephalic and atraumatic.     Mouth/Throat:     Mouth: Mucous membranes are dry.     Pharynx: Oropharynx is clear. No oropharyngeal exudate or posterior oropharyngeal erythema.  Eyes:     General: No scleral icterus.    Extraocular Movements: Extraocular movements intact.     Conjunctiva/sclera: Conjunctivae normal.  Cardiovascular:     Rate and Rhythm: Normal rate and regular rhythm.     Heart sounds: Normal heart sounds. No murmur heard.    No friction rub. No gallop.  Pulmonary:  Effort: Pulmonary effort is normal.     Breath sounds: Normal breath sounds. No wheezing, rhonchi or rales.  Abdominal:     General: Bowel sounds are normal. There is no distension.     Palpations: Abdomen is soft. There is no mass.     Tenderness: There is no abdominal tenderness.  Musculoskeletal:        General: Normal range of motion.     Right lower leg: No edema.     Left lower leg: No edema.  Skin:    General: Skin is warm and dry.     Coloration: Skin is not jaundiced.     Findings: No rash.     Comments: Skin turgor is decreased  Neurological:     Mental Status: He is alert and oriented to person, place, and time.     Cranial Nerves: No cranial nerve deficit.  Psychiatric:        Mood and Affect: Mood normal.        Behavior: Behavior normal.        Thought Content: Thought content normal.     LABS:      Latest Ref Rng & Units 04/13/2024   10:04 AM 04/05/2024    8:55 AM 03/14/2024    8:26 AM  CBC  WBC 4.0 - 10.5 K/uL 6.6  7.4  11.5   Hemoglobin 13.0 - 17.0 g/dL 89.7  89.8  88.9   Hematocrit 39.0 - 52.0 % 32.1  32.7  35.4   Platelets 150 - 400 K/uL 157  177  231       Latest Ref Rng & Units 04/13/2024   10:04 AM 04/05/2024    8:55 AM 03/14/2024    8:26 AM  CMP  Glucose 70 - 99 mg/dL 843  864  887   BUN 8 - 23 mg/dL 8  13  10    Creatinine 0.61 - 1.24 mg/dL 9.37  9.33  9.33   Sodium 135 - 145 mmol/L 134  138  138    Potassium 3.5 - 5.1 mmol/L 3.6  3.7  3.6   Chloride 98 - 111 mmol/L 98  104  103   CO2 22 - 32 mmol/L 22  22  22    Calcium 8.9 - 10.3 mg/dL 8.6  8.4  8.9   Total Protein 6.5 - 8.1 g/dL 6.5  6.4  6.7   Total Bilirubin 0.0 - 1.2 mg/dL 0.6  0.4  0.4   Alkaline Phos 38 - 126 U/L 1,022  695  496   AST 15 - 41 U/L 172  88  55   ALT 0 - 44 U/L 12  10  9       No results found for: CEA1, CEA / No results found for: CEA1, CEA No results found for: PSA No results found for: CAN199 No results found for: CAN125  No results found for: STEPHANY RINGS, A1GS, A2GS, BETS, BETA2SER, GAMS, MSPIKE, SPEI Lab Results  Component Value Date   TIBC 353 04/05/2024   TIBC 398 01/29/2022   FERRITIN 958 (H) 04/05/2024   FERRITIN 206 01/29/2022   IRONPCTSAT 20 04/05/2024   IRONPCTSAT 25 01/29/2022   No results found for: LDH  STUDIES:   CT ABDOMEN PELVIS W CONTRAST Result Date: 04/12/2024 CLINICAL DATA:  Chronic nausea.  Prostate cancer. EXAM: CT ABDOMEN AND PELVIS WITH CONTRAST TECHNIQUE: Multidetector CT imaging of the abdomen and pelvis was performed using the standard protocol following bolus administration of intravenous contrast. RADIATION DOSE REDUCTION:  This exam was performed according to the departmental dose-optimization program which includes automated exposure control, adjustment of the mA and/or kV according to patient size and/or use of iterative reconstruction technique. CONTRAST:  OMNIPAQUE  IOHEXOL  300 MG/ML  SOLN COMPARISON:  CT abdomen pelvis dated 02/08/2024. FINDINGS: Lower chest: Multiple pulmonary nodules involving the visualized lung bases consistent with metastatic disease. No intra-abdominal free air or free fluid. Hepatobiliary: The liver is unremarkable. No biliary dilatation. The gallbladder is unremarkable. Pancreas: Unremarkable. No pancreatic ductal dilatation or surrounding inflammatory changes. Spleen: Normal in size without focal  abnormality. Adrenals/Urinary Tract: The adrenal glands unremarkable. The kidneys, visualized ureters, and urinary bladder are unremarkable. Stomach/Bowel: There is no bowel obstruction or active inflammation. The appendix is normal. Vascular/Lymphatic: Mild aortoiliac atherosclerotic disease. The IVC is unremarkable. No portal venous gas. There is no adenopathy. Reproductive: Prostatectomy. Other: Small fat containing umbilical hernia. Musculoskeletal: Extensive skeletal osseous metastasis as seen on the prior CT. No acute osseous pathology. IMPRESSION: 1. No acute intra-abdominal or pelvic pathology. 2. Extensive skeletal osseous metastasis and pulmonary metastatic disease. 3.  Aortic Atherosclerosis (ICD10-I70.0). Electronically Signed   By: Vanetta Chou M.D.   On: 04/12/2024 15:40   DG HIP UNILAT WITH PELVIS 2-3 VIEWS LEFT Result Date: 04/10/2024 CLINICAL DATA:  pain in left hip, known bone mets from prostate cancer EXAM: DG HIP (WITH OR WITHOUT PELVIS) 2-3V LEFT COMPARISON:  CT AP, 02/08/2024. FINDINGS: There is no evidence of hip fracture or dislocation. Patchy osseous mineralization, greatest within sacrum and iliac crests. No evidence of hip arthropathy. IMPRESSION: 1. No acute displaced pelvis or LEFT hip fracture. 2. Diffuse pelvic osseous metastases. Electronically Signed   By: Thom Hall M.D.   On: 04/10/2024 13:14   US  Abdomen Limited RUQ (LIVER/GB) Result Date: 04/03/2024 CLINICAL DATA:  Chronic nausea. EXAM: ULTRASOUND ABDOMEN LIMITED RIGHT UPPER QUADRANT COMPARISON:  CT abdomen pelvis February 08, 2024 FINDINGS: Gallbladder: No gallstones or wall thickening visualized. No sonographic Murphy sign noted by sonographer. Common bile duct: Diameter: 2 mm. Liver: No focal lesion identified. Mild increased echotexture. Portal vein is patent on color Doppler imaging with normal direction of blood flow towards the liver. Other: None. IMPRESSION: 1. No acute abnormality identified. 2. Mild  increased echotexture of the liver. This is a nonspecific finding but can be seen in fatty infiltration of liver. Electronically Signed   By: Craig Farr M.D.   On: 04/03/2024 09:37      HISTORY:   Past Medical History:  Diagnosis Date   Hyperlipidemia    Hypokalemia 03/24/2023   Malignant neoplasm of prostate (HCC)    Supraventricular tachycardia     Past Surgical History:  Procedure Laterality Date   CORONARY STENT INTERVENTION N/A 02/08/2022   Procedure: CORONARY STENT INTERVENTION;  Surgeon: Mady Bruckner, MD;  Location: [facility]VASIVE CV [facility];  Service: Cardiovascular;  Laterality: N/A;   LEFT HEART CATH AND CORONARY ANGIOGRAPHY N/A 02/08/2022   Procedure: LEFT HEART CATH AND CORONARY ANGIOGRAPHY;  Surgeon: Mady Bruckner, MD;  Location: [facility] INVASIVE CV LAB;  Service: Cardiovascular;  Laterality: N/A;   PROSTATECTOMY  07/2014   TONSILLECTOMY      Family History  Problem Relation Age of Onset   Breast cancer Mother 25   Ovarian cancer Mother 63   Kidney Stones Sister    Breast cancer Paternal Grandmother 22    Social History:  reports that he has never smoked. He has never used smokeless tobacco. He reports that he does not currently use alcohol.  He reports that he does not use drugs.The patient is accompanied by his friend, Oneil, today.  Allergies: Allergies[1]  Current Medications: Current Outpatient Medications  Medication Sig Dispense Refill   Alirocumab 75 MG/ML SOAJ Inject 75 mg into the skin.     Cholecalciferol 25 MCG (1000 UT) tablet Take 1,000 Units by mouth in the morning and at bedtime.     clopidogrel  (PLAVIX ) 75 MG tablet Take 75 mg by mouth daily.     diphenhydrAMINE  (BENADRYL ) 25 mg capsule Take 1 capsule by mouth at bedtime.     diphenhydrAMINE  (BENADRYL ) 50 MG capsule Take 50 mg by mouth at bedtime as needed.     ezetimibe  (ZETIA ) 10 MG tablet Take 10 mg by mouth daily.     guaiFENesin-codeine 100-10 MG/5ML syrup Take 5-10 mLs by mouth every 4  (four) hours as needed.     isosorbide mononitrate (IMDUR) 30 MG 24 hr tablet Take 1 tablet by mouth every 8 (eight) hours as needed.     leuprolide  (LUPRON ) 22.5 MG injection Inject 22.5 mg into the muscle every 3 (three) months.     metoprolol  tartrate (LOPRESSOR ) 25 MG tablet Take 0.5 tablets (12.5 mg total) by mouth 2 (two) times daily. 120 tablet 1   morphine  (MS CONTIN ) 30 MG 12 hr tablet Take 1 tablet (30 mg total) by mouth in the morning, at noon, and at bedtime. 90 tablet 0   Multiple Vitamins-Minerals (MULTIVITAMIN WITH MINERALS) tablet Take 1 tablet by mouth daily.     nitroGLYCERIN  (NITROSTAT ) 0.4 MG SL tablet Place 0.4 mg under the tongue every 5 (five) minutes as needed for chest pain. Repeat every 5 minutes if needed for a total of 3 tablets in 15 minutes. If no relief, CALL 911.     Omega-3 Fatty Acids (FISH OIL) 1000 MG CAPS Take 2,000 mg by mouth in the morning and at bedtime.     ondansetron  (ZOFRAN ) 8 MG tablet Take 1 tablet (8 mg total) by mouth every 8 (eight) hours as needed for nausea or vomiting. 20 tablet 2   oxyCODONE  (OXY IR/ROXICODONE ) 5 MG immediate release tablet Take 1 tablet (5 mg total) by mouth every 6 (six) hours as needed for severe pain (pain score 7-10). 30 tablet 0   polyethylene glycol powder (GLYCOLAX/MIRALAX) 17 GM/SCOOP powder Take 1 Container by mouth once.     potassium chloride  (KLOR-CON  M) 10 MEQ tablet TAKE 1 TABLET(10 MEQ) BY MOUTH TWICE DAILY 180 tablet 3   prochlorperazine  (COMPAZINE ) 10 MG tablet Take 1 tablet (10 mg total) by mouth every 6 (six) hours as needed for nausea or vomiting. 30 tablet 2   Royal Jelly-Bee Pollen-Ginseng (KOREAN GINSENG COMPLEX) 150-250-50 MG CAPS Take 1 capsule by mouth daily.     triamcinolone  (KENALOG ) 0.025 % cream Apply 1 Application topically 2 (two) times daily. 30 g 5   Zoledronic  Acid (ZOMETA ) 4 MG/100ML IVPB Inject 4 mg into the vein every 3 (three) months.     No current facility-administered medications for  this visit.   Facility-Administered Medications Ordered in Other Visits  Medication Dose Route Frequency Provider Last Rate Last Admin   0.9 %  sodium chloride  infusion   Intravenous Continuous Analyn Matusek A, PA-C   Stopped at 04/13/24 1145   sodium chloride  flush (NS) 0.9 % injection 10 mL  10 mL Intravenous PRN Marvin Wanda DEL, MD   10 mL at 03/17/23 0953   sodium chloride  flush (NS) 0.9 % injection 10 mL  10  mL Intracatheter PRN Marvin Wanda DEL, MD   10 mL at 06/15/23 1234   sodium chloride  flush (NS) 0.9 % injection 10 mL  10 mL Intravenous PRN Marvin Wanda DEL, MD   10 mL at 07/06/23 9071   sodium chloride  flush (NS) 0.9 % injection 10 mL  10 mL Intravenous PRN Marvin Wanda DEL, MD   10 mL at 07/27/23 1114   sodium chloride  flush (NS) 0.9 % injection 10 mL  10 mL Intracatheter PRN Marvin Wanda DEL, MD   10 mL at 09/14/23 9046             [1]  Allergies Allergen Reactions   Atorvastatin    Rosuvastatin Other (See Comments)    Other reaction(s): Muscle pain   Simvastatin Other (See Comments)    Other reaction(s): Joint pain, Muscle pain   Sulfa Antibiotics    Sulfamethoxazole-Trimethoprim Other (See Comments)    Unknown as to interaction was a baby.   "

## 2024-04-13 ENCOUNTER — Inpatient Hospital Stay: Admitting: Hematology and Oncology

## 2024-04-13 ENCOUNTER — Inpatient Hospital Stay

## 2024-04-13 ENCOUNTER — Encounter: Payer: Self-pay | Admitting: Hematology and Oncology

## 2024-04-13 ENCOUNTER — Other Ambulatory Visit: Payer: Self-pay

## 2024-04-13 VITALS — BP 123/63 | HR 87 | Temp 97.9°F | Resp 18

## 2024-04-13 DIAGNOSIS — R7401 Elevation of levels of liver transaminase levels: Secondary | ICD-10-CM

## 2024-04-13 DIAGNOSIS — E860 Dehydration: Secondary | ICD-10-CM

## 2024-04-13 DIAGNOSIS — C61 Malignant neoplasm of prostate: Secondary | ICD-10-CM

## 2024-04-13 DIAGNOSIS — R748 Abnormal levels of other serum enzymes: Secondary | ICD-10-CM | POA: Insufficient documentation

## 2024-04-13 DIAGNOSIS — C7951 Secondary malignant neoplasm of bone: Secondary | ICD-10-CM

## 2024-04-13 DIAGNOSIS — R110 Nausea: Secondary | ICD-10-CM

## 2024-04-13 LAB — CMP (CANCER CENTER ONLY)
ALT: 12 U/L (ref 0–44)
AST: 172 U/L (ref 15–41)
Albumin: 3.6 g/dL (ref 3.5–5.0)
Alkaline Phosphatase: 1022 U/L — ABNORMAL HIGH (ref 38–126)
Anion gap: 14 (ref 5–15)
BUN: 8 mg/dL (ref 8–23)
CO2: 22 mmol/L (ref 22–32)
Calcium: 8.6 mg/dL — ABNORMAL LOW (ref 8.9–10.3)
Chloride: 98 mmol/L (ref 98–111)
Creatinine: 0.62 mg/dL (ref 0.61–1.24)
GFR, Estimated: >60 mL/min
Glucose, Bld: 156 mg/dL — ABNORMAL HIGH (ref 70–99)
Potassium: 3.6 mmol/L (ref 3.5–5.1)
Sodium: 134 mmol/L — ABNORMAL LOW (ref 135–145)
Total Bilirubin: 0.6 mg/dL (ref 0.0–1.2)
Total Protein: 6.5 g/dL (ref 6.5–8.1)

## 2024-04-13 LAB — CBC WITH DIFFERENTIAL (CANCER CENTER ONLY)
Abs Immature Granulocytes: 0.42 10*3/uL — ABNORMAL HIGH (ref 0.00–0.07)
Basophils Absolute: 0 10*3/uL (ref 0.0–0.1)
Basophils Relative: 1 %
Eosinophils Absolute: 0 10*3/uL (ref 0.0–0.5)
Eosinophils Relative: 1 %
HCT: 32.1 % — ABNORMAL LOW (ref 39.0–52.0)
Hemoglobin: 10.2 g/dL — ABNORMAL LOW (ref 13.0–17.0)
Immature Granulocytes: 6 %
Lymphocytes Relative: 30 %
Lymphs Abs: 2 10*3/uL (ref 0.7–4.0)
MCH: 27.3 pg (ref 26.0–34.0)
MCHC: 31.8 g/dL (ref 30.0–36.0)
MCV: 86.1 fL (ref 80.0–100.0)
Monocytes Absolute: 0.6 10*3/uL (ref 0.1–1.0)
Monocytes Relative: 9 %
Neutro Abs: 3.6 10*3/uL (ref 1.7–7.7)
Neutrophils Relative %: 53 %
Platelet Count: 157 10*3/uL (ref 150–400)
RBC: 3.73 MIL/uL — ABNORMAL LOW (ref 4.22–5.81)
RDW: 18.5 % — ABNORMAL HIGH (ref 11.5–15.5)
WBC Count: 6.6 10*3/uL (ref 4.0–10.5)
nRBC: 2.6 % — ABNORMAL HIGH (ref 0.0–0.2)

## 2024-04-13 LAB — MAGNESIUM: Magnesium: 2 mg/dL (ref 1.7–2.4)

## 2024-04-13 MED ORDER — MORPHINE SULFATE ER 30 MG PO TBCR: 30.0000 mg | EXTENDED_RELEASE_TABLET | Freq: Three times a day (TID) | ORAL | 0 refills | Status: AC

## 2024-04-13 MED ORDER — SODIUM CHLORIDE 0.9 % IV SOLN: INTRAVENOUS | Status: DC

## 2024-04-13 NOTE — Assessment & Plan Note (Signed)
 Stage IVA prostate cancer with bone metastasis diagnosed in January 2016.  He has been through multiple lines of chemotherapy and complete androgen blockade and has had a significant increase in his PSA, now measured as greater than 1500, so not helpful in identifying further progression.  PSMA PET in August was fairly stable.  He was placed on a trial of ketoconazole  in September and continued leuprolide  and zoledronic  acid every 3 months.  He received leuprolide  and zoledronic  acid in December.  I had him hold his ketoconazole  due to the elevation AST last month, which remains elevated. He was having increased left hip pain in the area of known metastasis.  Left hip x-rays did not reveal fracture.  Widespread osseous metastasis were seen.  Unfortunately, he has been through all treatment options except for Pluvicto.  I gave him oxycodone  5 mg to use every 6 hours as needed for breakthrough pain.  He really has not needed this and denies pain today.  If he has increasing pain, we will refer him for palliative external beam radiation.

## 2024-04-13 NOTE — Assessment & Plan Note (Signed)
 He has had multiple small pulmonary nodules felt to most likely represent metastatic disease.  Recent CT abdomen and pelvis revealed multiple pulmonary nodules involving the visualized lung bases consistent with metastatic disease.

## 2024-04-13 NOTE — Patient Instructions (Signed)
 Not Enough Water in the Body (Dehydration) in Adults: What to Know Dehydration is a condition in which there is not enough water or other fluids in the body. This happens when a person loses more fluids than they take in. Important organs cannot work right without the right amount of fluids. Any loss of fluids from the body can cause dehydration. Dehydration can be mild, worse, or very bad. It should be treated right away to keep it from getting very bad. What are the causes? Conditions that cause loss of water in the body. They include: Watery poop (diarrhea). Vomiting. Sweating a lot. Fever. Infection. Peeing (urinating) a lot. Not drinking enough fluids. Certain medicines, such as medicines that take extra fluid out of the body (diuretics). Lack of safe drinking water. Not being able to get enough water and food. What increases the risk? Having a long-term (chronic) illness that has not been treated the right way, such as: Diabetes. Heart disease. Kidney disease. Being 17 years of age or older. Having a disability. Living in a place that is high above the ground or sea (high in altitude). The thinner, drier air causes more fluid loss. Doing exercises that put stress on your body for a long time. Being active when in hot places. What are the signs or symptoms? Symptoms of dehydration depend on how bad it is. Mild or worse dehydration Thirst. Dry lips or dry mouth. Feeling dizzy or light-headed. Muscle cramps. Passing little pee or dark pee. Pee may be the color of tea. Headache. Very bad dehydration Changes in skin. Skin may: Be cold to the touch (clammy). Be blotchy or pale. Not go back to normal right after you pinch it and let it go. Little or no tears, pee, or sweat. Fast breathing. Low blood pressure. Weak pulse. Pulse that is more than 100 beats a minute when you are sitting still. Other changes, such as: Feeling very thirsty. Eyes that look hollow  (sunken). Cold hands and feet. Being confused. Being very tired (lethargic) or having trouble waking from sleep. Losing weight. Loss of consciousness. How is this treated? Treatment for this condition depends on how bad your dehydration is. Treatment should start right away. Do not wait until your condition gets very bad. Very bad dehydration is an emergency. You will need to go to a hospital. Mild or worse dehydration can be treated at home. You may be asked to: Drink more fluids. Drink an oral rehydration solution (ORS). This drink gives you the right amount of fluids, salts, and minerals (electrolytes). Very bad dehydration can be treated: With fluids through an IV tube. By correcting low levels of electrolytes in the body. By treating the problem that caused your dehydration. Follow these instructions at home: Oral rehydration solution If told by your doctor, drink an ORS: Make an ORS. Use instructions on the package. Start by drinking small amounts, about  cup (120 mL) every 5-10 minutes. Slowly drink more until you have had the amount that your doctor said to have.  Eating and drinking  Drink enough clear fluid to keep your pee pale yellow. If you were told to drink an ORS, finish the ORS first. Then, start slowly drinking other clear fluids. Drink fluids such as: Water. Do not drink only water. Doing that can make the salt (sodium) level in your body get too low. Water from ice chips you suck on. Fruit juice that you have added water to (diluted). Low-calorie sports drinks. Eat foods that have the right  amounts of salts and minerals, such as bananas, oranges, potatoes, tomatoes, or spinach. Do not drink alcohol . Avoid drinks that have caffeine or sugar. These include:: High-calorie sports drinks. Fruit juice that you did not add water to. Soda. Coffee or energy drinks. Avoid foods that are greasy or have a lot of fat or sugar. General instructions Take over-the-counter  and prescription medicines only as told by your doctor. Do not take sodium tablets. Doing that can make the salt level in your body get too high. Return to your normal activities as told by your doctor. Ask your doctor what activities are safe for you. Keep all follow-up visits. Your doctor may check and change your treatment. Contact a doctor if: You have pain in your belly (abdomen) and the pain: Gets worse. Stays in one place. You have a rash. You have a stiff neck. You get angry or annoyed more easily than normal. You are more tired or have a harder time waking than normal. You feel weak or dizzy. You feel very thirsty. Get help right away if: You have any symptoms of very bad dehydration. You vomit every time you eat or drink. Your vomiting gets worse, does not go away, or you vomit blood or green stuff. You are getting treatment, but symptoms are getting worse. You have a fever. You have a very bad headache. You have: Diarrhea that gets worse or does not go away. Blood in your poop (stool). This may cause poop to look black and tarry. No pee in 6-8 hours. Only a small amount of pee in 6-8 hours, and the pee is very dark. You have trouble breathing. These symptoms may be an emergency. Get help right away. Call 911. Do not wait to see if the symptoms will go away. Do not drive yourself to the hospital. This information is not intended to replace advice given to you by your health care provider. Make sure you discuss any questions you have with your health care provider. Document Revised: 01/06/2024 Document Reviewed: 09/28/2021 Elsevier Patient Education  2025 [employer].

## 2024-04-13 NOTE — Assessment & Plan Note (Addendum)
 Fairly chronic nausea of uncertain etiology.  I discontinued ketoconazole  and bee pollen last month.  He was instructed to take his narcotic pain medicine with food.  Gallbladder ultrasound was negative.  CT abdomen and pelvis did not reveal any explanation for the nausea. The patient feels it is his gallbladder. We will refer him to GI asap for further evaluation.  He is drinking more fluids, but remains clinically dehydrated, so will receive IV fluids today.  I recommended he try Claritin or Zyrtec for the upper respiratory congestion.  He is still eating very little due to decreased appetite. We could try mirtazapine for appetite but as he is already sleeping most of the day, I will hold off. I am concerned other medications to stimulate appetite will have worse adverse effects.  I encouraged him to eat several small meals a day and gave him samples of Carn[employer] instant breakfast to try, as he had difficulty with other dietary supplements.  I explained that that his fatigue is partially due to lack of nutrition.  I will have our dietician contact him. I will see him back next week with a CBC and comprehensive metabolic panel for continued supportive care. I will see him back next week with a CBC and comprehensive metabolic panel for continued supportive care.

## 2024-04-13 NOTE — Assessment & Plan Note (Deleted)
 Stage IVA prostate cancer with bone metastasis diagnosed in January 2016.  He has been through multiple lines of chemotherapy and complete androgen blockade and has had a significant increase in his PSA, now measured as greater than 1500, so not helpful in identifying further progression.  PSMA PET in August was fairly stable.  He was placed on a trial of ketoconazole  in September and continued leuprolide  and zoledronic  acid every 3 months.  He received leuprolide  and zoledronic  acid in December.  I had him hold his ketoconazole  due to the elevation AST last month, which remains elevated. He was having increased left hip pain in the area of known metastasis.  Left hip x-rays did not reveal fracture.  Widespread osseous metastasis were seen.  Unfortunately, he has been through all treatment options except for Pluvicto.  I gave him oxycodone  5 mg to use every 6 hours as needed for breakthrough pain.  He really has not needed this and denies pain today.  If he has increasing pain, we will refer him for palliative external beam radiation.

## 2024-04-13 NOTE — Assessment & Plan Note (Signed)
 This was initially felt to be due to acute gastroenteritis last month and had been improving but then last week started to increase again.  It is higher again today.  There is no obvious liver abnormality on CT imaging.  The plan is for the patient to see GI as soon as possible.  We will continue to monitor this.

## 2024-04-13 NOTE — Progress Notes (Signed)
 CRITICAL VALUE STICKER  CRITICAL VALUE:  04/13/2024 @ 1053  RECEIVER (on-site recipient of call):  Woodie Kapur RN  DATE & TIME NOTIFIED:   04/13/2024 @ 1053  MESSENGER (representative from lab):  [facility]  MD NOTIFIED:    Andrez Foy PA  TIME OF NOTIFICATION:  1102  RESPONSE:  Scheduled to see patient in clinic today.

## 2024-04-13 NOTE — Assessment & Plan Note (Signed)
 Worsening elevation of the alkaline phosphatase.  This has been slowly increasing since August and felt to be secondary to his bone metastasis.  It jumped up significantly with the acute gastroenteritis and had been improving, but now was significantly worse.  I stopped his ketoconazole  due to the elevated AST.  Unfortunately, other than radioactive Pluvicto, the patient has been through all treatment lines.

## 2024-04-20 ENCOUNTER — Other Ambulatory Visit: Payer: Self-pay

## 2024-04-20 ENCOUNTER — Other Ambulatory Visit: Payer: Self-pay | Admitting: Oncology

## 2024-04-20 ENCOUNTER — Inpatient Hospital Stay

## 2024-04-20 ENCOUNTER — Encounter: Payer: Self-pay | Admitting: Oncology

## 2024-04-20 ENCOUNTER — Inpatient Hospital Stay: Attending: Oncology | Admitting: Oncology

## 2024-04-20 VITALS — BP 130/67 | HR 92 | Temp 97.5°F | Resp 18 | Ht 67.0 in | Wt 165.4 lb

## 2024-04-20 VITALS — BP 108/65 | HR 74 | Resp 18

## 2024-04-20 DIAGNOSIS — C61 Malignant neoplasm of prostate: Secondary | ICD-10-CM

## 2024-04-20 DIAGNOSIS — C7951 Secondary malignant neoplasm of bone: Secondary | ICD-10-CM

## 2024-04-20 DIAGNOSIS — E860 Dehydration: Secondary | ICD-10-CM

## 2024-04-20 DIAGNOSIS — R112 Nausea with vomiting, unspecified: Secondary | ICD-10-CM

## 2024-04-20 LAB — CBC WITH DIFFERENTIAL (CANCER CENTER ONLY)
Abs Immature Granulocytes: 0.47 10*3/uL — ABNORMAL HIGH (ref 0.00–0.07)
Basophils Absolute: 0 10*3/uL (ref 0.0–0.1)
Basophils Relative: 0 %
Eosinophils Absolute: 0.1 10*3/uL (ref 0.0–0.5)
Eosinophils Relative: 1 %
HCT: 32.2 % — ABNORMAL LOW (ref 39.0–52.0)
Hemoglobin: 9.9 g/dL — ABNORMAL LOW (ref 13.0–17.0)
Immature Granulocytes: 7 %
Lymphocytes Relative: 41 %
Lymphs Abs: 2.7 10*3/uL (ref 0.7–4.0)
MCH: 27 pg (ref 26.0–34.0)
MCHC: 30.7 g/dL (ref 30.0–36.0)
MCV: 87.7 fL (ref 80.0–100.0)
Monocytes Absolute: 0.6 10*3/uL (ref 0.1–1.0)
Monocytes Relative: 9 %
Neutro Abs: 2.7 10*3/uL (ref 1.7–7.7)
Neutrophils Relative %: 42 %
Platelet Count: 157 10*3/uL (ref 150–400)
RBC: 3.67 MIL/uL — ABNORMAL LOW (ref 4.22–5.81)
RDW: 19.6 % — ABNORMAL HIGH (ref 11.5–15.5)
WBC Count: 6.5 10*3/uL (ref 4.0–10.5)
nRBC: 4.7 % — ABNORMAL HIGH (ref 0.0–0.2)

## 2024-04-20 LAB — CMP (CANCER CENTER ONLY)
ALT: 8 U/L (ref 0–44)
AST: 85 U/L — ABNORMAL HIGH (ref 15–41)
Albumin: 3.7 g/dL (ref 3.5–5.0)
Alkaline Phosphatase: 837 U/L — ABNORMAL HIGH (ref 38–126)
Anion gap: 13 (ref 5–15)
BUN: 10 mg/dL (ref 8–23)
CO2: 23 mmol/L (ref 22–32)
Calcium: 8.4 mg/dL — ABNORMAL LOW (ref 8.9–10.3)
Chloride: 101 mmol/L (ref 98–111)
Creatinine: 0.76 mg/dL (ref 0.61–1.24)
GFR, Estimated: >60 mL/min
Glucose, Bld: 123 mg/dL — ABNORMAL HIGH (ref 70–99)
Potassium: 3.9 mmol/L (ref 3.5–5.1)
Sodium: 138 mmol/L (ref 135–145)
Total Bilirubin: 0.6 mg/dL (ref 0.0–1.2)
Total Protein: 6.5 g/dL (ref 6.5–8.1)

## 2024-04-20 LAB — AMYLASE: Amylase: 19 U/L — ABNORMAL LOW (ref 28–100)

## 2024-04-20 LAB — LIPASE, BLOOD: Lipase: 15 U/L (ref 11–51)

## 2024-04-20 MED ORDER — SODIUM CHLORIDE 0.9 % IV SOLN: Freq: Once | INTRAVENOUS | Status: AC

## 2024-04-20 MED ORDER — ONDANSETRON HCL 4 MG PO TABS: 4.0000 mg | ORAL_TABLET | ORAL | 2 refills | Status: AC | PRN

## 2024-04-20 MED ORDER — ONDANSETRON HCL 4 MG/2ML IJ SOLN
8.0000 mg | Freq: Once | INTRAMUSCULAR | Status: AC
  Administered 2024-04-20: 8 mg via INTRAVENOUS
  Filled 2024-04-20: qty 4

## 2024-04-20 NOTE — Progress Notes (Shared)
 " Marvin Johnson  [address] [place],  [place]  [address] [phone]  Clinic Day:  04/20/2024  Referring physician: Paniagua, Tiziana, Marvin Johnson  ASSESSMENT & PLAN:   Assessment: Nausea Fairly chronic nausea of uncertain etiology.  We discontinued ketoconazole  and bee pollen in December.  He was instructed to take his narcotic pain medicine with food.  Gallbladder ultrasound was negative.  CT abdomen and pelvis did not reveal any explanation for the nausea. The patient feels it is his gallbladder. We will refer him to GI asap for further evaluation, but we are having to go through the [facility].  He is drinking more fluids, but remains clinically dehydrated, so will receive IV fluids today. He is still eating very little due to decreased appetite. We could try mirtazapine for appetite but as he is already sleeping most of the day, I will hold off. I am concerned other medications to stimulate appetite will have worse adverse effects.  We have asked the dietician to meet with him but we will reschedule that.   Elevated SGOT (AST) This was initially felt to be due to acute gastroenteritis last month and had been improving but then last week started to increase again.  It is higher again today.  There is no obvious liver abnormality on CT imaging.  The plan is for the patient to see GI as soon as possible.  We will continue to monitor this. Today his AST came down from 172 to 85.  Elevated alkaline phosphatase level Worsening elevation of the alkaline phosphatase.  This has been slowly increasing since August and felt to be secondary to his bone metastasis.  It jumped up significantly with the acute gastroenteritis and had been improving, but now was significantly worse.  I stopped his ketoconazole  due to the elevated AST.  Unfortunately, other than radioactive Pluvicto, the patient has been through all treatment lines, including Taxotere  and Jevtana .  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  He has been through multiple lines of chemotherapy and complete androgen blockade and has had a significant increase in his PSA, now measured as greater than 1500, so not helpful in identifying further progression.  PSMA PET in August was fairly stable.  He was placed on a trial of ketoconazole  in September and continued leuprolide  and zoledronic  acid every 3 months.  He received leuprolide  and zoledronic  acid in December. We stopped the ketoconazole  due to the elevation AST in December, which remains elevated and is fluctuating up and down. He was having increased left hip pain in the area of known metastasis.  Left hip x-rays did not reveal fracture.  Widespread osseous metastasis were seen.  Unfortunately, he has been through all treatment options except for Pluvicto.  I gave him oxycodone  5 mg to use every 6 hours as needed for breakthrough pain.  He really has not needed this and denies pain today.  If he has increasing pain, we will refer him for palliative external beam radiation.  Secondary malignant neoplasm of bone and bone marrow (HCC) He continues zoledronic  acid and leuprolide  every 3 months.  We stopped his ketoconazole  in December due to elevated AST.  His last dose of zoledronic  acid was in December.  He had increased left hip pain earlier this week.  X-ray did not reveal fracture.  Diffuse osseous metastasis were seen.  I gave him oxycodone  5 mg every 6 hours to use as needed for breakthrough pain.  He continues MS Contin  30 mg every 8 hours.  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  Recent CT abdomen and pelvis revealed multiple pulmonary nodules involving the visualized lung bases consistent with metastatic disease. I reviewed these images with the patient and his companion and they are slightly more prominent than  previous scan in November but were not visible in August. They all remain less than 1 cm.  Plan: He complains of back pain, dry heaving, and constipation. His nausea and vomiting continues to fluctuate up and down. He is currently taking 30 mg MS Contin  TID and uses 5 mg oxycodone  PRN every 6 hours for breakthrough pain, but he has only needed to use oxycodone  once. I have prescribed 4 mg Zofran  PRN and instructed him to take one at bedtime to prevent morning nausea. He is taking two Senokot BID for constipation with no relief so I instructed him to add daily Miralax. He had a CT abdomen and pelvis on 04/12/2024 which revealed no acute intra-abdominal or pelvic pathology, extensive skeletal osseous metastasis and pulmonary metastatic disease, which consists of a few tiny nodules. His liver ultrasound on 04/03/2024 revealed no acute abnormality identified and mild increased echotexture of the liver which is a nonspecific finding but can be seen in fatty infiltration of liver. I do not have a clear explanation for his persistent nausea and vomiting so we will check an amylase and lipase to rule out pancreatitis although the pancreas and gallbladder appear normal on CT. We are working on a gastroenterology referral through the [place]. Given his persistent nausea and vomiting, I will schedule him for a brain MRI to evaluate for brain metastases. He has a WBC of 6.5, low hemoglobin of 9.9 down from 10.2, and platelet count of 157,000. His CMP reveals a low calcium of 8.4 down from 8.6, an elevated AST down from 172, and an elevated alkaline phosphatase of 837 down from 1,022. I will add amylase and lipase to his lab today to evaluate his pancreas. I will prescribe 600 mg calcium supplement BID. He will receive 1L IV normal saline and 8 mg IV Zofran  today. His last Lupron  and Zometa  injections were on 02/21/2024 so he will be due in March. He is not on chemotherapy. He will return in 1 week with CBC and CBC. He will  receive IV fluids at that time, if needed. I will see him back in two weeks with CBC and CMP. His brain MRI is scheduled for 04/26/2024. I discussed the assessment and treatment plan with the patient and he was provided an opportunity to ask questions and all were answered. The patient agreed with the plan and demonstrated an understanding of the instructions.   I provided 33 minutes of face-to-face time during this encounter and > 50% was spent counseling as documented under my assessment and plan.   Marvin VEAR Cornish, Marvin Johnson   [facility] - A DEPT OF [facility] [address] [place] [address] Dept: [phone] Dept Fax: [phone]   No orders of the defined types were placed in this encounter.   CHIEF COMPLAINT:  CC: Nausea  Current Treatment: Supportive care  HISTORY OF PRESENT ILLNESS:   Oncology History  Malignant neoplasm of prostate (HCC)  03/30/2014 Cancer Staging   Staging form: Prostate, AJCC 8th Edition - Clinical stage from 03/30/2014: Stage IVA (cT3b, cN1, cM0,  PSA: 51.8, Grade Group: 4) - Signed by Cornelius Marvin DEL, Marvin Johnson on 11/14/2020 Histopathologic type: Adenocarcinoma, NOS Stage prefix: Initial diagnosis Prostate specific antigen (PSA) range: 20 or greater Gleason primary pattern: 4 Gleason secondary pattern: 4 Gleason score: 8 Histologic grading system: 5 grade system Location of positive needle core biopsies: Beyond primary site Prognostic indicators: May 2016 Robotic prostatectomy with mult. Pos margins, extra prostatic extension, 2/6 pos nodes. Scan June 2016 positive at T10  PSA up to 216.3 by August Placed on lupron  and casodex Stage used in treatment planning: Yes National guidelines used in treatment planning: Yes Type of national guideline used in treatment planning: NCCN   02/18/2020 Initial Diagnosis   Malignant neoplasm of prostate (HCC)   12/01/2021 - 07/19/2022 Chemotherapy   Patient is  on Treatment Plan : PROSTATE Docetaxel  (75) + Prednisone  q21d     12/21/2022 - 08/26/2023 Chemotherapy   Patient is on Treatment Plan : PROSTATE Cabazitaxel  (20) D1 + Prednisone  D1-21 q21d     Secondary malignant neoplasm of bone and bone marrow (HCC)  02/18/2020 Initial Diagnosis   Secondary malignant neoplasm of bone and bone marrow (HCC)   12/01/2021 - 07/19/2022 Chemotherapy   Patient is on Treatment Plan : PROSTATE Docetaxel  (75) + Prednisone  q21d     12/21/2022 - 08/26/2023 Chemotherapy   Patient is on Treatment Plan : PROSTATE Cabazitaxel  (20) D1 + Prednisone  D1-21 q21d     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 Patient states that he feels poorly and complains of back pain, dry heaving, and constipation. His nausea and vomiting continues to fluctuate up and down. He is currently taking 30 mg MS Contin  TID and uses 5 mg oxycodone  PRN every 6 hours for breakthrough pain, but he has only needed to use oxycodone  once. I have prescribed 4 mg Zofran  PRN and instructed him to take one at bedtime to prevent morning nausea. He is taking two Senokot BID for constipation with no relief so I instructed him to add daily Miralax. He had a CT abdomen and pelvis on 04/12/2024 which revealed no acute intra-abdominal or pelvic pathology, extensive skeletal osseous metastasis and pulmonary metastatic disease, which consists of a few tiny nodules. His liver ultrasound on 04/03/2024 revealed no acute abnormality identified and mild increased echotexture of the liver which is a nonspecific finding but can be seen in fatty infiltration of liver. I do not have a clear explanation  for his persistent nausea and vomiting so we will check an amylase and lipase to rule out pancreatitis although the pancreas and gallbladder appear normal on CT. We are working on a gastroenterology referral through the [place]. Given his persistent nausea and vomiting, I will schedule him for a brain MRI to evaluate for brain metastases. He has a WBC of 6.5, low hemoglobin of 9.9 down from 10.2, and platelet count of 157,000. His CMP reveals a low calcium of 8.4 down from 8.6, an elevated AST down from 172, and an elevated alkaline phosphatase of 837 down from 1,022. I will add amylase and lipase to his lab today to evaluate his pancreas. I will prescribe 600 mg calcium supplement  BID. He will receive 1L IV normal saline and 8 mg IV Zofran  today. His last Lupron  and Zometa  injections were on 02/21/2024 so he will be due in March. He is not on chemotherapy. He will return in 1 week with CBC and CBC. He will receive IV fluids at that time, if needed. I will see him back in two weeks with CBC and CMP. His brain MRI is scheduled for 04/26/2024.  He denies fever, chills, night sweats, or other signs of infection. His appetite is poor and His weight has decreased 2 pounds over last 2 weeks. He is accompanied by Marvin Johnson.  REVIEW OF SYSTEMS:   Review of Systems  Constitutional:  Positive for appetite change (decreased) and fatigue. Negative for chills, fever and unexpected weight change.  HENT:   Negative for lump/mass, mouth sores and sore throat.   Respiratory:  Negative for cough and shortness of breath.   Cardiovascular:  Negative for chest pain and leg swelling.  Gastrointestinal:  Positive for constipation, nausea and vomiting (dry heaves). Negative for abdominal pain and diarrhea.  Genitourinary:  Negative for difficulty urinating, dysuria, frequency and hematuria.   Musculoskeletal:  Positive for back pain and gait problem (Ambulates with cane). Negative for arthralgias and myalgias.  Skin:  Negative  for itching and rash.  Neurological:  Positive for gait problem (Ambulates with cane). Negative for dizziness, extremity weakness, headaches, light-headedness and numbness.  Hematological:  Negative for adenopathy.  Psychiatric/Behavioral:  Negative for depression and sleep disturbance. The patient is not nervous/anxious.     VITALS:   Today's Vitals   04/20/24 0904  BP: 130/67  Pulse: 92  Resp: 18  Temp: (!) 97.5 F (36.4 C)  TempSrc: Oral  SpO2: 98%  Weight: 165 lb 6.4 oz (75 kg)  Height: 5' 7 (1.702 m)  PainSc: 0-No pain   Body mass index is 25.91 kg/m.;  Wt Readings from Last 3 Encounters:  04/20/24 165 lb 6.4 oz (75 kg)  04/05/24 167 lb 14.4 oz (76.2 kg)  03/14/24 169 lb 12.8 oz (77 kg)    Body mass index is 25.91 kg/m.  Performance status (ECOG): 2 - Symptomatic, <50% confined to bed  PHYSICAL EXAM:   Physical Exam Vitals and nursing note reviewed. Exam conducted with a chaperone present.  Constitutional:      General: He is not in acute distress.    Appearance: Normal appearance. He is normal weight. He is ill-appearing (weak appearing older gentleman).  HENT:     Head: Normocephalic and atraumatic.     Mouth/Throat:     Mouth: Mucous membranes are dry.     Pharynx: Oropharynx is clear. No oropharyngeal exudate or posterior oropharyngeal erythema.  Eyes:     General: No scleral icterus.    Extraocular Movements: Extraocular movements intact.     Conjunctiva/sclera: Conjunctivae normal.  Cardiovascular:     Rate and Rhythm: Normal rate and regular rhythm.     Heart sounds: Normal heart sounds. No murmur heard.    No friction rub. No gallop.  Pulmonary:     Effort: Pulmonary effort is normal.     Breath sounds: Normal breath sounds. No wheezing, rhonchi or rales.  Abdominal:     General: Bowel sounds are normal. There is no distension.     Palpations: Abdomen is soft. There is no mass.     Tenderness: There is abdominal tenderness (mid upper abdomen).   Musculoskeletal:        General: Normal range of  motion.     Right lower leg: No edema.     Left lower leg: No edema.  Skin:    General: Skin is warm and dry.     Coloration: Skin is not jaundiced.     Findings: No rash.     Comments: Skin turgor is decreased  Neurological:     Mental Status: He is alert and oriented to person, place, and time.     Cranial Nerves: No cranial nerve deficit.  Psychiatric:        Mood and Affect: Mood normal.        Behavior: Behavior normal.        Thought Content: Thought content normal.    LABS:      Latest Ref Rng & Units 04/20/2024    8:30 AM 04/13/2024   10:04 AM 04/05/2024    8:55 AM  CBC  WBC 4.0 - 10.5 K/uL 6.5  6.6  7.4   Hemoglobin 13.0 - 17.0 g/dL 9.9  89.7  89.8   Hematocrit 39.0 - 52.0 % 32.2  32.1  32.7   Platelets 150 - 400 K/uL 157  157  177       Latest Ref Rng & Units 04/20/2024    8:30 AM 04/13/2024   10:04 AM 04/05/2024    8:55 AM  CMP  Glucose 70 - 99 mg/dL 876  843  864   BUN 8 - 23 mg/dL 10  8  13    Creatinine 0.61 - 1.24 mg/dL 9.23  9.37  9.33   Sodium 135 - 145 mmol/L 138  134  138   Potassium 3.5 - 5.1 mmol/L 3.9  3.6  3.7   Chloride 98 - 111 mmol/L 101  98  104   CO2 22 - 32 mmol/L 23  22  22    Calcium 8.9 - 10.3 mg/dL 8.4  8.6  8.4   Total Protein 6.5 - 8.1 g/dL 6.5  6.5  6.4   Total Bilirubin 0.0 - 1.2 mg/dL 0.6  0.6  0.4   Alkaline Phos 38 - 126 U/L 837  1,022  695   AST 15 - 41 U/L 85  172  88   ALT 0 - 44 U/L 8  12  10       No results found for: CEA1, CEA / No results found for: CEA1, CEA No results found for: PSA No results found for: CAN199 No results found for: CAN125  No results found for: STEPHANY RINGS, A1GS, A2GS, BETS, BETA2SER, GAMS, MSPIKE, SPEI Lab Results  Component Value Date   TIBC 353 04/05/2024   TIBC 398 01/29/2022   FERRITIN 958 (H) 04/05/2024   FERRITIN 206 01/29/2022   IRONPCTSAT 20 04/05/2024   IRONPCTSAT 25 01/29/2022   No results  found for: LDH  STUDIES:   CT ABDOMEN PELVIS W CONTRAST Result Date: 04/12/2024 CLINICAL DATA:  Chronic nausea.  Prostate cancer. EXAM: CT ABDOMEN AND PELVIS WITH CONTRAST TECHNIQUE: Multidetector CT imaging of the abdomen and pelvis was performed using the standard protocol following bolus administration of intravenous contrast. RADIATION DOSE REDUCTION: This exam was performed according to the departmental dose-optimization program which includes automated exposure control, adjustment of the mA and/or kV according to patient size and/or use of iterative reconstruction technique. CONTRAST:  100mL OMNIPAQUE  IOHEXOL  300 MG/ML  SOLN COMPARISON:  CT abdomen pelvis dated 02/08/2024. FINDINGS: Lower chest: Multiple pulmonary nodules involving the visualized lung bases consistent with metastatic disease. No intra-abdominal free air or free fluid. Hepatobiliary:  The liver is unremarkable. No biliary dilatation. The gallbladder is unremarkable. Pancreas: Unremarkable. No pancreatic ductal dilatation or surrounding inflammatory changes. Spleen: Normal in size without focal abnormality. Adrenals/Urinary Tract: The adrenal glands unremarkable. The kidneys, visualized ureters, and urinary bladder are unremarkable. Stomach/Bowel: There is no bowel obstruction or active inflammation. The appendix is normal. Vascular/Lymphatic: Mild aortoiliac atherosclerotic disease. The IVC is unremarkable. No portal venous gas. There is no adenopathy. Reproductive: Prostatectomy. Other: Small fat containing umbilical hernia. Musculoskeletal: Extensive skeletal osseous metastasis as seen on the prior CT. No acute osseous pathology. IMPRESSION: 1. No acute intra-abdominal or pelvic pathology. 2. Extensive skeletal osseous metastasis and pulmonary metastatic disease. 3.  Aortic Atherosclerosis (ICD10-I70.0). Electronically Signed   By: Vanetta Chou M.D.   On: 04/12/2024 15:40   DG HIP UNILAT WITH PELVIS 2-3 VIEWS LEFT Result Date:  04/10/2024 CLINICAL DATA:  pain in left hip, known bone mets from prostate cancer EXAM: DG HIP (WITH OR WITHOUT PELVIS) 2-3V LEFT COMPARISON:  CT AP, 02/08/2024. FINDINGS: There is no evidence of hip fracture or dislocation. Patchy osseous mineralization, greatest within sacrum and iliac crests. No evidence of hip arthropathy. IMPRESSION: 1. No acute displaced pelvis or LEFT hip fracture. 2. Diffuse pelvic osseous metastases. Electronically Signed   By: Thom Hall M.D.   On: 04/10/2024 13:14   US  Abdomen Limited RUQ (LIVER/GB) Result Date: 04/03/2024 CLINICAL DATA:  Chronic nausea. EXAM: ULTRASOUND ABDOMEN LIMITED RIGHT UPPER QUADRANT COMPARISON:  CT abdomen pelvis February 08, 2024 FINDINGS: Gallbladder: No gallstones or wall thickening visualized. No sonographic Murphy sign noted by sonographer. Common bile duct: Diameter: 2 mm. Liver: No focal lesion identified. Mild increased echotexture. Portal vein is patent on color Doppler imaging with normal direction of blood flow towards the liver. Other: None. IMPRESSION: 1. No acute abnormality identified. 2. Mild increased echotexture of the liver. This is a nonspecific finding but can be seen in fatty infiltration of liver. Electronically Signed   By: Craig Farr M.D.   On: 04/03/2024 09:37    HISTORY:   Past Medical History:  Diagnosis Date   Hyperlipidemia    Hypokalemia 03/24/2023   Malignant neoplasm of prostate (HCC)    Supraventricular tachycardia     Past Surgical History:  Procedure Laterality Date   CORONARY STENT INTERVENTION N/A 02/08/2022   Procedure: CORONARY STENT INTERVENTION;  Surgeon: Mady Bruckner, Marvin Johnson;  Location: [facility];  Service: Cardiovascular;  Laterality: N/A;   LEFT HEART CATH AND CORONARY ANGIOGRAPHY N/A 02/08/2022   Procedure: LEFT HEART CATH AND CORONARY ANGIOGRAPHY;  Surgeon: Mady Bruckner, Marvin Johnson;  Location: [facility];  Service: Cardiovascular;  Laterality: N/A;   PROSTATECTOMY  07/2014    TONSILLECTOMY      Family History  Problem Relation Age of Onset   Breast cancer Mother 46   Ovarian cancer Mother 72   Kidney Stones Sister    Breast cancer Paternal Grandmother 43    Social History:  reports that he has never smoked. He has never used smokeless tobacco. He reports that he does not currently use alcohol. He reports that he does not use drugs.The patient is accompanied by his friend, Marvin, today.  Allergies: Allergies[1]  Current Medications: Current Outpatient Medications  Medication Sig Dispense Refill   ondansetron  (ZOFRAN ) 4 MG tablet Take 1 tablet (4 mg total) by mouth every 4 (four) hours as needed for nausea or vomiting. 20 tablet 2   Alirocumab 75 MG/ML SOAJ Inject 75 mg into the skin.  Cholecalciferol 25 MCG (1000 UT) tablet Take 1,000 Units by mouth in the morning and at bedtime.     clopidogrel  (PLAVIX ) 75 MG tablet Take 75 mg by mouth daily.     diphenhydrAMINE  (BENADRYL ) 25 mg capsule Take 1 capsule by mouth at bedtime.     diphenhydrAMINE  (BENADRYL ) 50 MG capsule Take 50 mg by mouth at bedtime as needed.     ezetimibe  (ZETIA ) 10 MG tablet Take 10 mg by mouth daily.     guaiFENesin-codeine 100-10 MG/5ML syrup Take 5-10 mLs by mouth every 4 (four) hours as needed.     isosorbide mononitrate (IMDUR) 30 MG 24 hr tablet Take 1 tablet by mouth every 8 (eight) hours as needed.     leuprolide  (LUPRON ) 22.5 MG injection Inject 22.5 mg into the muscle every 3 (three) months.     metoprolol  tartrate (LOPRESSOR ) 25 MG tablet Take 0.5 tablets (12.5 mg total) by mouth 2 (two) times daily. 120 tablet 1   morphine  (MS CONTIN ) 30 MG 12 hr tablet Take 1 tablet (30 mg total) by mouth in the morning, at noon, and at bedtime. 90 tablet 0   Multiple Vitamins-Minerals (MULTIVITAMIN WITH MINERALS) tablet Take 1 tablet by mouth daily.     nitroGLYCERIN  (NITROSTAT ) 0.4 MG SL tablet Place 0.4 mg under the tongue every 5 (five) minutes as needed for chest pain. Repeat every 5  minutes if needed for a total of 3 tablets in 15 minutes. If no relief, CALL 911.     Omega-3 Fatty Acids (FISH OIL) 1000 MG CAPS Take 2,000 mg by mouth in the morning and at bedtime.     oxyCODONE  (OXY IR/ROXICODONE ) 5 MG immediate release tablet Take 1 tablet (5 mg total) by mouth every 6 (six) hours as needed for severe pain (pain score 7-10). 30 tablet 0   polyethylene glycol powder (GLYCOLAX/MIRALAX) 17 GM/SCOOP powder Take 1 Container by mouth once.     potassium chloride  (KLOR-CON  M) 10 MEQ tablet TAKE 1 TABLET(10 MEQ) BY MOUTH TWICE DAILY 180 tablet 3   prochlorperazine  (COMPAZINE ) 10 MG tablet Take 1 tablet (10 mg total) by mouth every 6 (six) hours as needed for nausea or vomiting. 30 tablet 2   Royal Jelly-Bee Pollen-Ginseng (KOREAN GINSENG COMPLEX) 150-250-50 MG CAPS Take 1 capsule by mouth daily.     triamcinolone  (KENALOG ) 0.025 % cream Apply 1 Application topically 2 (two) times daily. 30 g 5   Zoledronic  Acid (ZOMETA ) 4 MG/100ML IVPB Inject 4 mg into the vein every 3 (three) months.     No current facility-administered medications for this visit.   Facility-Administered Medications Ordered in Other Visits  Medication Dose Route Frequency Provider Last Rate Last Admin   sodium chloride  flush (NS) 0.9 % injection 10 mL  10 mL Intravenous PRN Cornelius Marvin DEL, Marvin Johnson   10 mL at 03/17/23 0953   sodium chloride  flush (NS) 0.9 % injection 10 mL  10 mL Intracatheter PRN Cornelius Marvin DEL, Marvin Johnson   10 mL at 06/15/23 1234   sodium chloride  flush (NS) 0.9 % injection 10 mL  10 mL Intravenous PRN Cornelius Marvin DEL, Marvin Johnson   10 mL at 07/06/23 9071   sodium chloride  flush (NS) 0.9 % injection 10 mL  10 mL Intravenous PRN Cornelius Marvin DEL, Marvin Johnson   10 mL at 07/27/23 1114   sodium chloride  flush (NS) 0.9 % injection 10 mL  10 mL Intracatheter PRN Cornelius Marvin DEL, Marvin Johnson   10 mL at 09/14/23 [phone]  LILLETTE Aretta Cook, acting as a scribe for Marvin VEAR Cornish, Marvin Johnson, have documented all relevant  documentation on the behalf of Marvin VEAR Cornish, Marvin Johnson, as directed by Marvin VEAR Cornish, Marvin Johnson, while in the presence of Marvin VEAR Cornish, Marvin Johnson.  I, Marvin VEAR Cornish, Marvin Johnson , have reviewed this report as typed by the medical scribe, and it is complete and accurate.     [1]  Allergies Allergen Reactions   Atorvastatin    Rosuvastatin Other (See Comments)    Other reaction(s): Muscle pain   Simvastatin Other (See Comments)    Other reaction(s): Joint pain, Muscle pain   Sulfa Antibiotics    Sulfamethoxazole-Trimethoprim Other (See Comments)    Unknown as to interaction was a [id number]   "

## 2024-04-20 NOTE — Patient Instructions (Signed)
 Not Enough Water in the Body (Dehydration) in Adults: What to Know Dehydration is a condition in which there is not enough water or other fluids in the body. This happens when a person loses more fluids than they take in. Important organs cannot work right without the right amount of fluids. Any loss of fluids from the body can cause dehydration. Dehydration can be mild, worse, or very bad. It should be treated right away to keep it from getting very bad. What are the causes? Conditions that cause loss of water in the body. They include: Watery poop (diarrhea). Vomiting. Sweating a lot. Fever. Infection. Peeing (urinating) a lot. Not drinking enough fluids. Certain medicines, such as medicines that take extra fluid out of the body (diuretics). Lack of safe drinking water. Not being able to get enough water and food. What increases the risk? Having a long-term (chronic) illness that has not been treated the right way, such as: Diabetes. Heart disease. Kidney disease. Being 17 years of age or older. Having a disability. Living in a place that is high above the ground or sea (high in altitude). The thinner, drier air causes more fluid loss. Doing exercises that put stress on your body for a long time. Being active when in hot places. What are the signs or symptoms? Symptoms of dehydration depend on how bad it is. Mild or worse dehydration Thirst. Dry lips or dry mouth. Feeling dizzy or light-headed. Muscle cramps. Passing little pee or dark pee. Pee may be the color of tea. Headache. Very bad dehydration Changes in skin. Skin may: Be cold to the touch (clammy). Be blotchy or pale. Not go back to normal right after you pinch it and let it go. Little or no tears, pee, or sweat. Fast breathing. Low blood pressure. Weak pulse. Pulse that is more than 100 beats a minute when you are sitting still. Other changes, such as: Feeling very thirsty. Eyes that look hollow  (sunken). Cold hands and feet. Being confused. Being very tired (lethargic) or having trouble waking from sleep. Losing weight. Loss of consciousness. How is this treated? Treatment for this condition depends on how bad your dehydration is. Treatment should start right away. Do not wait until your condition gets very bad. Very bad dehydration is an emergency. You will need to go to a hospital. Mild or worse dehydration can be treated at home. You may be asked to: Drink more fluids. Drink an oral rehydration solution (ORS). This drink gives you the right amount of fluids, salts, and minerals (electrolytes). Very bad dehydration can be treated: With fluids through an IV tube. By correcting low levels of electrolytes in the body. By treating the problem that caused your dehydration. Follow these instructions at home: Oral rehydration solution If told by your doctor, drink an ORS: Make an ORS. Use instructions on the package. Start by drinking small amounts, about  cup (120 mL) every 5-10 minutes. Slowly drink more until you have had the amount that your doctor said to have.  Eating and drinking  Drink enough clear fluid to keep your pee pale yellow. If you were told to drink an ORS, finish the ORS first. Then, start slowly drinking other clear fluids. Drink fluids such as: Water. Do not drink only water. Doing that can make the salt (sodium) level in your body get too low. Water from ice chips you suck on. Fruit juice that you have added water to (diluted). Low-calorie sports drinks. Eat foods that have the right  amounts of salts and minerals, such as bananas, oranges, potatoes, tomatoes, or spinach. Do not drink alcohol . Avoid drinks that have caffeine or sugar. These include:: High-calorie sports drinks. Fruit juice that you did not add water to. Soda. Coffee or energy drinks. Avoid foods that are greasy or have a lot of fat or sugar. General instructions Take over-the-counter  and prescription medicines only as told by your doctor. Do not take sodium tablets. Doing that can make the salt level in your body get too high. Return to your normal activities as told by your doctor. Ask your doctor what activities are safe for you. Keep all follow-up visits. Your doctor may check and change your treatment. Contact a doctor if: You have pain in your belly (abdomen) and the pain: Gets worse. Stays in one place. You have a rash. You have a stiff neck. You get angry or annoyed more easily than normal. You are more tired or have a harder time waking than normal. You feel weak or dizzy. You feel very thirsty. Get help right away if: You have any symptoms of very bad dehydration. You vomit every time you eat or drink. Your vomiting gets worse, does not go away, or you vomit blood or green stuff. You are getting treatment, but symptoms are getting worse. You have a fever. You have a very bad headache. You have: Diarrhea that gets worse or does not go away. Blood in your poop (stool). This may cause poop to look black and tarry. No pee in 6-8 hours. Only a small amount of pee in 6-8 hours, and the pee is very dark. You have trouble breathing. These symptoms may be an emergency. Get help right away. Call 911. Do not wait to see if the symptoms will go away. Do not drive yourself to the hospital. This information is not intended to replace advice given to you by your health care provider. Make sure you discuss any questions you have with your health care provider. Document Revised: 01/06/2024 Document Reviewed: 09/28/2021 Elsevier Patient Education  2025 [employer].

## 2024-04-26 ENCOUNTER — Inpatient Hospital Stay: Admitting: Dietician

## 2024-04-26 ENCOUNTER — Other Ambulatory Visit (HOSPITAL_BASED_OUTPATIENT_CLINIC_OR_DEPARTMENT_OTHER): Admitting: Radiology

## 2024-04-27 ENCOUNTER — Inpatient Hospital Stay

## 2024-05-04 ENCOUNTER — Inpatient Hospital Stay: Admitting: Oncology

## 2024-05-04 ENCOUNTER — Inpatient Hospital Stay
